# Patient Record
Sex: Female | Born: 1955
Health system: Southern US, Community
[De-identification: ages and names within clinical notes are randomized; demographics above are authoritative.]

## PROBLEM LIST (undated history)

## (undated) DIAGNOSIS — I10 Essential (primary) hypertension: Secondary | ICD-10-CM

## (undated) DIAGNOSIS — N6092 Unspecified benign mammary dysplasia of left breast: Secondary | ICD-10-CM

## (undated) DIAGNOSIS — E785 Hyperlipidemia, unspecified: Secondary | ICD-10-CM

## (undated) DIAGNOSIS — M25569 Pain in unspecified knee: Secondary | ICD-10-CM

## (undated) DIAGNOSIS — Z9289 Personal history of other medical treatment: Secondary | ICD-10-CM

## (undated) DIAGNOSIS — IMO0002 Reserved for concepts with insufficient information to code with codable children: Secondary | ICD-10-CM

## (undated) DIAGNOSIS — Z9889 Other specified postprocedural states: Secondary | ICD-10-CM

## (undated) DIAGNOSIS — M1990 Unspecified osteoarthritis, unspecified site: Secondary | ICD-10-CM

## (undated) DIAGNOSIS — Z8619 Personal history of other infectious and parasitic diseases: Secondary | ICD-10-CM

## (undated) DIAGNOSIS — Z5189 Encounter for other specified aftercare: Secondary | ICD-10-CM

## (undated) DIAGNOSIS — E119 Type 2 diabetes mellitus without complications: Secondary | ICD-10-CM

## (undated) DIAGNOSIS — I2510 Atherosclerotic heart disease of native coronary artery without angina pectoris: Secondary | ICD-10-CM

## (undated) DIAGNOSIS — S46009A Unspecified injury of muscle(s) and tendon(s) of the rotator cuff of unspecified shoulder, initial encounter: Secondary | ICD-10-CM

## (undated) DIAGNOSIS — Z8742 Personal history of other diseases of the female genital tract: Secondary | ICD-10-CM

## (undated) DIAGNOSIS — Z91018 Allergy to other foods: Secondary | ICD-10-CM

## (undated) DIAGNOSIS — J45990 Exercise induced bronchospasm: Secondary | ICD-10-CM

## (undated) DIAGNOSIS — M2550 Pain in unspecified joint: Secondary | ICD-10-CM

## (undated) DIAGNOSIS — M722 Plantar fascial fibromatosis: Secondary | ICD-10-CM

## (undated) DIAGNOSIS — J45909 Unspecified asthma, uncomplicated: Secondary | ICD-10-CM

## (undated) DIAGNOSIS — M549 Dorsalgia, unspecified: Secondary | ICD-10-CM

## (undated) DIAGNOSIS — G43109 Migraine with aura, not intractable, without status migrainosus: Secondary | ICD-10-CM

## (undated) DIAGNOSIS — M65312 Trigger thumb, left thumb: Secondary | ICD-10-CM

## (undated) DIAGNOSIS — D649 Anemia, unspecified: Secondary | ICD-10-CM

## (undated) HISTORY — DX: Encounter for other specified aftercare: Z51.89

## (undated) HISTORY — DX: Unspecified benign mammary dysplasia of left breast: N60.92

## (undated) HISTORY — DX: Personal history of other infectious and parasitic diseases: Z86.19

## (undated) HISTORY — DX: Reserved for concepts with insufficient information to code with codable children: IMO0002

## (undated) HISTORY — DX: Other specified postprocedural states: Z98.890

## (undated) HISTORY — DX: Atherosclerotic heart disease of native coronary artery without angina pectoris: I25.10

## (undated) HISTORY — DX: Pain in unspecified joint: M25.50

## (undated) HISTORY — DX: Hyperlipidemia, unspecified: E78.5

## (undated) HISTORY — DX: Morbid (severe) obesity due to excess calories: E66.01

## (undated) HISTORY — DX: Allergy to other foods: Z91.018

## (undated) HISTORY — DX: Unspecified osteoarthritis, unspecified site: M19.90

## (undated) HISTORY — DX: Personal history of other medical treatment: Z92.89

## (undated) HISTORY — DX: Pain in unspecified knee: M25.569

## (undated) HISTORY — DX: Migraine with aura, not intractable, without status migrainosus: G43.109

## (undated) HISTORY — DX: Essential (primary) hypertension: I10

## (undated) HISTORY — DX: Unspecified injury of muscle(s) and tendon(s) of the rotator cuff of unspecified shoulder, initial encounter: S46.009A

## (undated) HISTORY — DX: Trigger thumb, left thumb: M65.312

## (undated) HISTORY — DX: Type 2 diabetes mellitus without complications: E11.9

## (undated) HISTORY — DX: Exercise induced bronchospasm: J45.990

## (undated) HISTORY — DX: Personal history of other diseases of the female genital tract: Z87.42

## (undated) HISTORY — DX: Anemia, unspecified: D64.9

## (undated) HISTORY — DX: Plantar fascial fibromatosis: M72.2

## (undated) HISTORY — DX: Unspecified asthma, uncomplicated: J45.909

## (undated) HISTORY — DX: Dorsalgia, unspecified: M54.9

---

## 1981-10-18 DIAGNOSIS — Z5189 Encounter for other specified aftercare: Secondary | ICD-10-CM

## 1981-10-18 HISTORY — PX: EXPLORATORY LAPAROTOMY: SUR591

## 1981-10-18 HISTORY — DX: Encounter for other specified aftercare: Z51.89

## 1982-10-18 HISTORY — PX: TOTAL ABDOMINAL HYSTERECTOMY W/ BILATERAL SALPINGOOPHORECTOMY: SHX83

## 1997-10-18 HISTORY — PX: GANGLION CYST EXCISION: SHX1691

## 1997-11-21 ENCOUNTER — Ambulatory Visit (HOSPITAL_COMMUNITY): Admission: RE | Admit: 1997-11-21 | Discharge: 1997-11-21 | Payer: Self-pay | Admitting: Family Medicine

## 1998-06-25 ENCOUNTER — Encounter: Payer: Self-pay | Admitting: Family Medicine

## 1998-06-25 ENCOUNTER — Ambulatory Visit (HOSPITAL_COMMUNITY): Admission: RE | Admit: 1998-06-25 | Discharge: 1998-06-25 | Payer: Self-pay | Admitting: Family Medicine

## 1999-01-15 ENCOUNTER — Ambulatory Visit (HOSPITAL_COMMUNITY): Admission: RE | Admit: 1999-01-15 | Discharge: 1999-01-15 | Payer: Self-pay | Admitting: Family Medicine

## 1999-07-23 ENCOUNTER — Encounter: Payer: Self-pay | Admitting: Family Medicine

## 1999-07-23 ENCOUNTER — Ambulatory Visit (HOSPITAL_COMMUNITY): Admission: RE | Admit: 1999-07-23 | Discharge: 1999-07-23 | Payer: Self-pay | Admitting: Family Medicine

## 2000-08-01 ENCOUNTER — Encounter: Payer: Self-pay | Admitting: Family Medicine

## 2000-08-01 ENCOUNTER — Ambulatory Visit (HOSPITAL_COMMUNITY): Admission: RE | Admit: 2000-08-01 | Discharge: 2000-08-01 | Payer: Self-pay | Admitting: Family Medicine

## 2001-04-03 ENCOUNTER — Other Ambulatory Visit: Admission: RE | Admit: 2001-04-03 | Discharge: 2001-04-03 | Payer: Self-pay | Admitting: Family Medicine

## 2001-08-11 ENCOUNTER — Ambulatory Visit (HOSPITAL_COMMUNITY): Admission: RE | Admit: 2001-08-11 | Discharge: 2001-08-11 | Payer: Self-pay | Admitting: Family Medicine

## 2001-08-11 ENCOUNTER — Encounter: Payer: Self-pay | Admitting: Family Medicine

## 2002-08-23 ENCOUNTER — Ambulatory Visit (HOSPITAL_COMMUNITY): Admission: RE | Admit: 2002-08-23 | Discharge: 2002-08-23 | Payer: Self-pay | Admitting: Family Medicine

## 2002-08-23 ENCOUNTER — Encounter: Payer: Self-pay | Admitting: Family Medicine

## 2003-06-23 ENCOUNTER — Ambulatory Visit (HOSPITAL_COMMUNITY): Admission: RE | Admit: 2003-06-23 | Discharge: 2003-06-23 | Payer: Self-pay | Admitting: Pediatrics

## 2003-06-23 ENCOUNTER — Encounter: Payer: Self-pay | Admitting: Family Medicine

## 2003-10-25 ENCOUNTER — Emergency Department (HOSPITAL_COMMUNITY): Admission: EM | Admit: 2003-10-25 | Discharge: 2003-10-25 | Payer: Self-pay | Admitting: Emergency Medicine

## 2003-12-19 ENCOUNTER — Other Ambulatory Visit: Admission: RE | Admit: 2003-12-19 | Discharge: 2003-12-19 | Payer: Self-pay | Admitting: Obstetrics & Gynecology

## 2003-12-25 ENCOUNTER — Ambulatory Visit (HOSPITAL_COMMUNITY): Admission: RE | Admit: 2003-12-25 | Discharge: 2003-12-25 | Payer: Self-pay | Admitting: Pediatrics

## 2003-12-26 ENCOUNTER — Encounter: Payer: Self-pay | Admitting: Cardiology

## 2003-12-26 ENCOUNTER — Ambulatory Visit (HOSPITAL_COMMUNITY): Admission: RE | Admit: 2003-12-26 | Discharge: 2003-12-26 | Payer: Self-pay | Admitting: Cardiology

## 2005-01-01 ENCOUNTER — Encounter: Admission: RE | Admit: 2005-01-01 | Discharge: 2005-01-01 | Payer: Self-pay | Admitting: Pediatrics

## 2005-01-03 ENCOUNTER — Emergency Department (HOSPITAL_COMMUNITY): Admission: EM | Admit: 2005-01-03 | Discharge: 2005-01-03 | Payer: Self-pay | Admitting: Emergency Medicine

## 2005-01-04 ENCOUNTER — Encounter: Admission: RE | Admit: 2005-01-04 | Discharge: 2005-01-04 | Payer: Self-pay | Admitting: Interventional Radiology

## 2005-01-12 ENCOUNTER — Encounter: Admission: RE | Admit: 2005-01-12 | Discharge: 2005-01-12 | Payer: Self-pay | Admitting: Pediatrics

## 2005-10-18 HISTORY — PX: CHOLECYSTECTOMY: SHX55

## 2006-02-22 ENCOUNTER — Encounter: Admission: RE | Admit: 2006-02-22 | Discharge: 2006-02-22 | Payer: Self-pay | Admitting: Gastroenterology

## 2006-04-14 ENCOUNTER — Ambulatory Visit (HOSPITAL_COMMUNITY): Admission: RE | Admit: 2006-04-14 | Discharge: 2006-04-14 | Payer: Self-pay | Admitting: General Surgery

## 2006-04-14 ENCOUNTER — Encounter (INDEPENDENT_AMBULATORY_CARE_PROVIDER_SITE_OTHER): Payer: Self-pay | Admitting: Specialist

## 2007-10-19 DIAGNOSIS — Z9889 Other specified postprocedural states: Secondary | ICD-10-CM

## 2007-10-19 HISTORY — DX: Other specified postprocedural states: Z98.890

## 2008-10-18 DIAGNOSIS — E119 Type 2 diabetes mellitus without complications: Secondary | ICD-10-CM

## 2008-10-18 HISTORY — PX: BREAST LUMPECTOMY: SHX2

## 2008-10-18 HISTORY — DX: Type 2 diabetes mellitus without complications: E11.9

## 2008-10-18 HISTORY — PX: BREAST BIOPSY: SHX20

## 2009-12-08 ENCOUNTER — Other Ambulatory Visit: Admission: RE | Admit: 2009-12-08 | Discharge: 2009-12-08 | Payer: Self-pay | Admitting: General Surgery

## 2010-07-31 ENCOUNTER — Emergency Department (HOSPITAL_COMMUNITY): Admission: EM | Admit: 2010-07-31 | Discharge: 2010-08-01 | Payer: Self-pay | Admitting: Emergency Medicine

## 2010-11-04 ENCOUNTER — Ambulatory Visit: Payer: Self-pay | Admitting: Family Medicine

## 2010-12-07 ENCOUNTER — Encounter: Payer: Commercial Managed Care - PPO | Attending: Internal Medicine

## 2010-12-29 ENCOUNTER — Encounter: Payer: Commercial Managed Care - PPO | Attending: Internal Medicine

## 2011-02-04 ENCOUNTER — Encounter: Payer: 59 | Attending: Internal Medicine | Admitting: *Deleted

## 2011-02-04 DIAGNOSIS — Z713 Dietary counseling and surveillance: Secondary | ICD-10-CM | POA: Insufficient documentation

## 2011-02-04 DIAGNOSIS — E119 Type 2 diabetes mellitus without complications: Secondary | ICD-10-CM | POA: Insufficient documentation

## 2011-03-05 NOTE — Op Note (Signed)
[name], Cindy Carey            ACCOUNT NO.:  [id number]   MEDICAL RECORD NO.:  [id number]          PATIENT TYPE:  EMS   LOCATION:  MAJO                         FACILITY:  [facility]   PHYSICIAN:  Deanna Artis. Hickling, M.D.DATE OF BIRTH:  Mar 13, 1956   DATE OF PROCEDURE:  01/03/2005  DATE OF DISCHARGE:                                 OPERATIVE REPORT   PROCEDURE PERFORMED:  Lumbar puncture.   SIGNIFICANT PHYSICAL FINDINGS:  Severe headache with meningismus, evaluate  for subarachnoid hemorrhage.   MEDICAL HISTORY:  The patient has complicated migraines with cluster type  headaches, allodynia, severe persistent visual scotomata and intermittent  severe pain of various types.  In the past week she has had two episodes of  pounding pain associated with loud tinnitus followed by confusional state  and stiff neck.  CT scan of the brain failed to show evidence of hemorrhage  or any other lesions.  Lumbar puncture is being done to look for the  presence of subarachnoid hemorrhage.   DESCRIPTION OF PROCEDURE:  After informed consent, the patient was sterilely  prepped and draped, local anesthesia with 1% Xylocaine was instilled in the  subcutaneous tissue over L3-4.  On the second pass, 13 mL of clear,  colorless spinal fluid was obtained.  Opening pressure 240 mmH2O, closing  pressure 190 mmH2O.  The patient came in with a severe headache, nausea and  allodynia and visual scotoma.  During the course of the lumbar puncture, her  headache was somewhat relieved as fluid was removed.  The patient tolerated  the procedure well.  Samples will be sent to the laboratory for culture,  glucose, protein, cell count and differential.  The remainder will be saved  in case it is needed.      WHH/MEDQ  D:  01/03/2005  T:  01/04/2005  Job:  [id number]

## 2011-03-05 NOTE — Op Note (Signed)
Cindy Carey, Cindy Carey            ACCOUNT NO.:  [id number]   MEDICAL RECORD NO.:  [id number]            PATIENT TYPE:   LOCATION:                                 FACILITY:   PHYSICIAN:  Cherylynn Ridges, M.D.         DATE OF BIRTH:   DATE OF PROCEDURE:  DATE OF DISCHARGE:                                 OPERATIVE REPORT   PREOPERATIVE DIAGNOSIS:  Symptomatic cholelithiasis.   POSTOPERATIVE DIAGNOSIS.:  Symptomatic cholelithiasis.   PROCEDURE:  Laparoscopic cholecystectomy with cholangiogram.   SURGEON:  Marta Lamas. Lindie Spruce, M.D.   ASSISTANT:  Rose Phi. Maple Hudson, M.D.   ANESTHESIA:  General endotracheal.   ESTIMATED BLOOD LOSS:  Less than 30 mL.   COMPLICATIONS:  None.   CONDITION:  Stable.   FINDINGS:  An acutely, chronically-inflamed gallbladder with multiple  omental adhesions.   INDICATIONS FOR OPERATION:  The patient is a 55 year old patient with  abdominal pain and gallstones, who now comes in for elective laparoscopic  cholecystectomy.   FINDINGS:  The patient had dense adhesions to the gallbladder body and  infundibulum.  Cholangiogram was normal.   OPERATION:  The patient was taken to the operating room, placed on the table  in supine position.  After an adequate general endotracheal anesthetic was  administered, she was prepped and draped in the usual sterile manner  exposing midline and right upper quadrant.   The patient had a previous right paramedian incision.  A supraumbilical  curvilinear incision was made using a #11 blade and taken down to the  midline fascia.  With appendiceal retractors in the wound, we able to grasp  the midline fascia, then make an incision in the fascia using a #15 blade.  Through this fascial opening, we able to bluntly dissect into the peritoneal  cavity and freely pass a finger into this area.  A pursestring suture of #0  Vicryl on a UR-6 needle was passed around the fascial opening and this was  used to secure the [place] cannula which  was subsequently passed in place.  With the Hasson cannula in place, we insufflated with carbon dioxide gas up  to maximal intra-abdominal pressure of 15 mmHg.  We then passed two, right  costal margin, 5 mm cannulas, and the subxiphoid 11/12 mm cannula under  direct vision into the peritoneal cavity.  Once all cannulas were in place,  the patient was placed in reverse Trendelenburg.  The left side was tilted  down, and the dissection begun.   The dome of the gallbladder was easily grasped using a ratcheted grasper  through the lateral-most 5 mm cannula.  We had to place a second grasper  after we dissected off omental and pericolonic adhesions to the body and  infundibulum of the gallbladder.  Once we had the infundibulum freed up,  using dissection using [place] dissector we able to dissect out the  peritoneum overlying the triangle of Calot and hepatoduodenal triangle  isolating both the cystic duct and the cystic artery.  These structures were  easily seen and dissected free getting windows around them with minimal  difficulty.  We passed an Endoclip across the gallbladder side of the cystic  duct and made a cholecystodochotomy, through which a Cook catheter was  passed, which had been passed through the anterior abdominal wall.  A  cholangiogram was done through the cholangiocatheter which would show good  flow into the duodenum, no intraductal filling defects, no dilation of the  duct and good proximal flow.   Once the cholangiogram was completely removed, the catheter and clip which  had secured it in place.  We passed three Endoclips on the distal cystic  duct and transected the cystic duct.  Two distal and two proximal Endoclips  were placed on the cystic artery and the cystic artery was transected.  We  then dissected out the gallbladder from its bed with minimal difficulty,  puncturing the gallbladder with electrocautery and allowing bile spillage;  however, there was no  spillage of stones.   Once the gallbladder was completely dissected free, we were able to bring it  out through the supraumbilical fascial site using an EndoCatch bag.  Once  this was done, we were able to close out the supraumbilical fascial opening  using a pursestring suture which was in place.  We irrigated the abdominal  cavity with about a liter of warm saline solution.  Once this was done, we  aspirated all fluid and gas from above the liver as we removed all cannulas.   We injected 0.25% Marcaine with epi at all sites and then closed the skin  using running, subcuticular stitch of 4-0 Vicryl.  Sterile dressings were  applied including Steri-Strips.  All needle, sponge counts and instrument  counts were correct.      Cherylynn Ridges, M.D.  Electronically Signed     JOW/MEDQ  D:  04/14/2006  T:  04/14/2006  Job:  [id number]

## 2011-03-17 ENCOUNTER — Encounter: Payer: Commercial Managed Care - PPO | Attending: Internal Medicine | Admitting: Dietician

## 2011-03-23 ENCOUNTER — Ambulatory Visit: Payer: Commercial Managed Care - PPO | Admitting: Dietician

## 2011-10-19 DIAGNOSIS — Z9289 Personal history of other medical treatment: Secondary | ICD-10-CM

## 2011-10-19 HISTORY — DX: Personal history of other medical treatment: Z92.89

## 2012-06-07 ENCOUNTER — Encounter (INDEPENDENT_AMBULATORY_CARE_PROVIDER_SITE_OTHER): Payer: Self-pay | Admitting: Surgery

## 2012-06-07 ENCOUNTER — Other Ambulatory Visit (INDEPENDENT_AMBULATORY_CARE_PROVIDER_SITE_OTHER): Payer: Self-pay | Admitting: General Surgery

## 2012-06-07 ENCOUNTER — Ambulatory Visit (INDEPENDENT_AMBULATORY_CARE_PROVIDER_SITE_OTHER): Payer: Commercial Managed Care - PPO | Admitting: Surgery

## 2012-06-07 VITALS — BP 136/88 | HR 96 | Temp 97.3°F | Resp 20 | Ht 63.0 in | Wt 278.6 lb

## 2012-06-07 DIAGNOSIS — K828 Other specified diseases of gallbladder: Secondary | ICD-10-CM | POA: Insufficient documentation

## 2012-06-07 DIAGNOSIS — Z9889 Other specified postprocedural states: Secondary | ICD-10-CM

## 2012-06-07 DIAGNOSIS — E669 Obesity, unspecified: Secondary | ICD-10-CM

## 2012-06-07 NOTE — Progress Notes (Signed)
Chief Complaint:  Obesity and DM with BMI 49  History of Present Illness:  Cindy Carey is an 56 y.o. female who has been to our seminars and is wondering what bariatric procedure would be best for her. She has diabetes mellitus since 2010 and has been vacillating between lapband and Roux-en-Y gastric bypass. She is followed by Dr. Ivery Quale. 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  She had endometriosis as a young woman and underwent an exploratory lap 1983 followed by a complete abdominal hysterectomy in 1984. I told her that would be one mitigating factor and Roux-en-Y gastric bypass since she could have really severe adhesions binding her small bowel in her pelvis and precluding Roux-en-Y. We may going to go with Roux was her primary procedure with lapband as a backup in case were unable to complete the Roux.  With her problems with endometriosis she had multiple transfusions. She has had problems with high blood pressure as well as her diabetes. She's tried multiple medical weight loss interventions including medications such as Meridia and phentermine. She denies loud snoring.  I think should be a good candidate were for weight loss surgery since she has been overweight for about the last 25 years and has a significant type 2 diabetes. Will begin the journey for rule out primarily but with the lapband as a backup. History reviewed. No pertinent past medical history.  Past Surgical History  Procedure Date  . Abdominal hysterectomy   . Cholecystectomy   . Breast lumpectomy     Current Outpatient Prescriptions  Medication Sig Dispense Refill  . lisinopril (PRINIVIL,ZESTRIL) 10 MG tablet Take 10 mg by mouth daily.      . metFORMIN (GLUCOPHAGE) 500 MG tablet Take 500 mg by mouth 2 (two) times daily with a meal.      . simvastatin (ZOCOR) 40 MG tablet Take 40 mg by mouth every evening.      . topiramate (TOPAMAX) 50 MG tablet Take 50 mg by mouth 2 (two) times  daily.       Bee venom; Penicillins; and Shellfish allergy History reviewed. No pertinent family history. Social History:   reports that she has never smoked. She has never used smokeless tobacco. Her alcohol and drug histories not on file.   REVIEW OF SYSTEMS - PERTINENT POSITIVES ONLY: See above  Physical Exam:   Blood pressure 136/88, pulse 96, temperature 97.3 F (36.3 C), temperature source Temporal, resp. rate 20, height 5\' 3"  (1.6 m), weight 278 lb 9.6 oz (126.372 kg). Body mass index is 49.35 kg/(m^2).  Gen:  WDWN WF NAD  Neurological: Alert and oriented to person, place, and time. Motor and sensory function is grossly intact  Head: Normocephalic and atraumatic.  Eyes: Conjunctivae are normal. Pupils are equal, round, and reactive to light. No scleral icterus.  Neck: Normal range of motion. Neck supple. No tracheal deviation or thyromegaly present.  Cardiovascular:  SR without murmurs or gallops.  No carotid bruits Respiratory: Effort normal.  No respiratory distress. No chest wall tenderness. Breath sounds normal.  No wheezes, rales or rhonchi.  Abdomen:  Multiple incisions GU: Musculoskeletal: Normal range of motion. Extremities are nontender. No cyanosis, edema or clubbing noted Lymphadenopathy: No cervical, preauricular, postauricular or axillary adenopathy is present Skin: Skin is warm and dry. No rash noted. No diaphoresis. No erythema. No pallor. Pscyh: Normal mood and affect. Behavior is normal. Judgment and thought content normal.   LABORATORY RESULTS: No results found for this or  any previous visit (from the past 48 hour(s)).  RADIOLOGY RESULTS: No results found.  Problem List: Patient Active Problem List  Diagnosis  . S/P breast biopsy, left 2011 [place]  . Biliary dyskinesia-lap chole 2007  . Obesity-BMI 49    Assessment & Plan: Morbid obesity and DM. Plan roux Y gastric bypass with lapband as a backup incase unable to do roux Y    Matt B. Daphine Deutscher,  MD, [facility] [phone] beeper [phone]  06/07/2012 4:41 PM

## 2012-06-20 ENCOUNTER — Ambulatory Visit (HOSPITAL_COMMUNITY)
Admission: RE | Admit: 2012-06-20 | Payer: Commercial Managed Care - PPO | Source: Ambulatory Visit | Admitting: General Surgery

## 2012-06-20 ENCOUNTER — Encounter (HOSPITAL_COMMUNITY): Admission: RE | Payer: Self-pay | Source: Ambulatory Visit

## 2012-06-20 SURGERY — BREATH TEST, FOR HELICOBACTER PYLORI

## 2012-06-24 ENCOUNTER — Ambulatory Visit: Payer: Commercial Managed Care - PPO | Admitting: *Deleted

## 2012-07-11 ENCOUNTER — Other Ambulatory Visit (HOSPITAL_COMMUNITY): Payer: Commercial Managed Care - PPO

## 2012-07-11 ENCOUNTER — Ambulatory Visit (HOSPITAL_COMMUNITY): Payer: Commercial Managed Care - PPO

## 2012-08-24 ENCOUNTER — Ambulatory Visit (HOSPITAL_COMMUNITY)
Admission: RE | Admit: 2012-08-24 | Discharge: 2012-08-24 | Disposition: A | Discharge status: Home / Self Care | Payer: 59 | Source: Ambulatory Visit | Attending: Surgery | Admitting: Surgery

## 2012-08-24 ENCOUNTER — Other Ambulatory Visit: Payer: Self-pay

## 2012-08-24 ENCOUNTER — Ambulatory Visit (HOSPITAL_COMMUNITY): Payer: 59

## 2012-08-24 DIAGNOSIS — E119 Type 2 diabetes mellitus without complications: Secondary | ICD-10-CM | POA: Insufficient documentation

## 2012-08-24 DIAGNOSIS — E669 Obesity, unspecified: Secondary | ICD-10-CM

## 2012-08-24 DIAGNOSIS — K828 Other specified diseases of gallbladder: Secondary | ICD-10-CM

## 2012-08-24 DIAGNOSIS — I10 Essential (primary) hypertension: Secondary | ICD-10-CM | POA: Insufficient documentation

## 2012-08-24 DIAGNOSIS — Z6841 Body Mass Index (BMI) 40.0 and over, adult: Secondary | ICD-10-CM | POA: Insufficient documentation

## 2012-08-24 DIAGNOSIS — K449 Diaphragmatic hernia without obstruction or gangrene: Secondary | ICD-10-CM | POA: Insufficient documentation

## 2012-08-24 DIAGNOSIS — Z13820 Encounter for screening for osteoporosis: Secondary | ICD-10-CM | POA: Insufficient documentation

## 2012-08-28 ENCOUNTER — Encounter (HOSPITAL_COMMUNITY)
Admission: RE | Disposition: A | Discharge status: Home / Self Care | Payer: Self-pay | Source: Ambulatory Visit | Attending: Surgery

## 2012-08-28 ENCOUNTER — Ambulatory Visit (HOSPITAL_COMMUNITY)
Admission: RE | Admit: 2012-08-28 | Discharge: 2012-08-28 | Disposition: A | Discharge status: Home / Self Care | Payer: 59 | Source: Ambulatory Visit | Attending: Surgery | Admitting: Surgery

## 2012-08-28 DIAGNOSIS — Z01818 Encounter for other preprocedural examination: Secondary | ICD-10-CM | POA: Insufficient documentation

## 2012-08-28 HISTORY — PX: BREATH TEK H PYLORI: SHX5422

## 2012-08-28 SURGERY — BREATH TEST, FOR HELICOBACTER PYLORI

## 2012-08-29 ENCOUNTER — Encounter (HOSPITAL_COMMUNITY): Payer: Self-pay

## 2012-08-29 ENCOUNTER — Encounter (HOSPITAL_COMMUNITY): Payer: Self-pay | Admitting: Surgery

## 2012-08-31 ENCOUNTER — Ambulatory Visit: Payer: 59 | Admitting: *Deleted

## 2012-09-04 ENCOUNTER — Encounter: Payer: Self-pay | Admitting: *Deleted

## 2012-09-04 ENCOUNTER — Encounter: Payer: 59 | Attending: Surgery | Admitting: *Deleted

## 2012-09-04 VITALS — Ht 63.0 in | Wt 278.3 lb

## 2012-09-04 DIAGNOSIS — E669 Obesity, unspecified: Secondary | ICD-10-CM | POA: Insufficient documentation

## 2012-09-04 DIAGNOSIS — Z01818 Encounter for other preprocedural examination: Secondary | ICD-10-CM | POA: Insufficient documentation

## 2012-09-04 DIAGNOSIS — Z713 Dietary counseling and surveillance: Secondary | ICD-10-CM | POA: Insufficient documentation

## 2012-09-04 HISTORY — DX: Morbid (severe) obesity due to excess calories: E66.01

## 2012-09-04 NOTE — Patient Instructions (Addendum)
   Follow Pre-Op Nutrition Goals to prepare for Gastric Bypass Surgery.   Call the [facility] at [phone] once you have been given your surgery date to enrolled in the Pre-Op Nutrition Class. You will need to attend this nutrition class 3-4 weeks prior to your surgery. 

## 2012-09-04 NOTE — Progress Notes (Signed)
  Pre-Op Assessment Visit:  Pre-Operative RYGB Surgery  Medical Nutrition Therapy:  Appt start time: 1130   End time:  1230.  Patient was seen on 09/04/2012 for Pre-Operative RYGB Nutrition Assessment. Assessment and letter of approval faxed to [facility] Bariatric Surgery Program coordinator on 09/04/2012.  Approval letter sent to [facility] and will be available in the chart under the media tab.  Handouts given during visit include:  Pre-Op Goals   Bariatric Surgery Protein Shakes  Patient to call for Pre-Op and Post-Op Nutrition Education at the [facility] when surgery is scheduled.

## 2012-09-05 ENCOUNTER — Ambulatory Visit: Payer: 59 | Admitting: *Deleted

## 2012-09-06 ENCOUNTER — Other Ambulatory Visit (INDEPENDENT_AMBULATORY_CARE_PROVIDER_SITE_OTHER): Payer: Self-pay | Admitting: Surgery

## 2012-09-06 LAB — T4: T4, Total: 12.1 ug/dL (ref 5.0–12.5)

## 2012-10-10 ENCOUNTER — Encounter (INDEPENDENT_AMBULATORY_CARE_PROVIDER_SITE_OTHER): Payer: Self-pay

## 2012-11-28 ENCOUNTER — Encounter (INDEPENDENT_AMBULATORY_CARE_PROVIDER_SITE_OTHER): Payer: Self-pay

## 2012-12-02 ENCOUNTER — Other Ambulatory Visit: Payer: Self-pay

## 2013-06-01 ENCOUNTER — Ambulatory Visit (INDEPENDENT_AMBULATORY_CARE_PROVIDER_SITE_OTHER): Payer: 59 | Admitting: Family Medicine

## 2013-06-01 VITALS — BP 127/81 | HR 74 | Ht 64.0 in | Wt 276.0 lb

## 2013-06-01 DIAGNOSIS — E119 Type 2 diabetes mellitus without complications: Secondary | ICD-10-CM

## 2013-06-01 DIAGNOSIS — E1169 Type 2 diabetes mellitus with other specified complication: Secondary | ICD-10-CM | POA: Insufficient documentation

## 2013-06-01 NOTE — Progress Notes (Signed)
Subjective:  Patient presents today for 3 month diabetes follow-up as part of the employer-sponsored [employer] to Wellness program. Current diabetes regimen includes metformin 1000 mg BID, Invokana 300 mg once daily. Patient also continues on daily ASA, ACEi, and statin.   Last appointment with Dr. Eloise Harman was in April. She usually sees him every 3-6 months for diabetes. She anticipates seeing him again in October. Last A1C in April was 6.1%.   Patient left her meter in [place] a few weeks ago so she hasn't been checking her blood sugar for the past 10 days or so. She reports no other medical changes.    Disease Assessments:  Diabetes:  uses glucometer; Type of Diabetes: Type 2; Year of diagnosis 2010; Sees Diabetes provider 3 times per year; Diabetes Education attended all core classes; MD managing Diabetes paterson; checks blood glucose 2-6 times a week; Current Diabetes related medical conditions are High blood pressure, High cholesterol; takes medications as prescribed; takes an aspirin a day; hypoglycemia frequency never;   Highest CBG 128; Lowest CBG 87; Other Diabetes History: When she had her meter she was checking it three or four times a week fasting. She did it more when she was hungrier.   She did not have her meter with her today. Per patient report it usually runs about 100.       Social History:  Denies alcohol use; Denies caffeine use; Denies drug use; Exercise adherence 3-4 days a week; 180 minutes of exercise per week.  Occupation: NP at [employer]   Physical Activity-  [employer]. This past week she has been doing yard work (cutting grass and trimming). She reports that she is really sore from all the movement. When she does go to [employer], she goes with her husband at least four times a week and she spends 45 minutes there. She does the treadmill. Sometimes she will do the stationary bike. She is looking forward to the fall- she and her husband go and  hike more.   Nutrition-  She states that she thinks it could be better. She hates summer and states she does better in the fall and winter. She doesn't cook as much because of the heat. Her diet is not as varied.   Typical Day  B- instant oatmeals 1 packet; strawberry [employer] protein shake.   snack- measured out walnuts or almonds (~1/4 cup) and a cup of tea. Cheese sticks.   L- varies- baby carrots. Sometimes a sandwich- [place] sandwich with saurkraut, cucumber. Sometimes a salad. When drug reps bring lunch she picks around at what to eat. Sometimes a healthy choice frozen entree.   snack- cup of tea, sometimes PB and cracker  D- last night- 1/2 baked potato, cheese, steamable brussel sprouts, leftover salmon. Banana muffin (small sized).   Doesn't eat fast food. Doesn't eat fruit because it is too messy at work. She states that she eats more on the weekend.      Testing:  Blood Sugar Tests:  Hemoglobin A1c: 6.1 resulted on 01/22/2013     Assessment/Plan: Patient is a 57 year old female with DM2. Last A1C in April was 6.1% and is meeting goal of less than 6.5%. Self reported CBG results are around 100 for fasting, so I expect that her A1C is still meeting her goal of less than 6.5%.   Patient is getting 180 minutes of physical activity during the week. 24 hour recall shows a mostly balanced diet. She is eating vegetables, but  is not eating a lot of fruits. She appears to be staying below 45 grams of carbohydrates with each meal.   Patient admits that she is getting bored with her exercise routine. She states that she has wanted to try the circuit training available at [employer], but she is worried that she will hurt herself using some of the machines. I encouraged her to make an appointment with a trainer from the gym so that she can learn how to perform the exercises safely.   Follow up with patient in 3 months..    Goals for Next Visit-   1. Continue making healthy  eating choices.  2. Continue physical activity. Look for ways to add variety to your routine. If possible, add some light weight training.  Next appointment with me is Friday November 14th at 4:30 PM.

## 2013-07-05 NOTE — Progress Notes (Signed)
Patient ID: Cindy Carey, female   DOB: Oct 23, 1955, 57 y.o.   MRN: [id number] ATTENDING PHYSICIAN NOTE: I have reviewed the chart and agree with the plan as detailed above. Denny Levy MD Pager [phone]

## 2013-08-23 ENCOUNTER — Other Ambulatory Visit: Payer: Self-pay

## 2013-08-31 ENCOUNTER — Ambulatory Visit (INDEPENDENT_AMBULATORY_CARE_PROVIDER_SITE_OTHER): Payer: Self-pay | Admitting: Family Medicine

## 2013-08-31 VITALS — BP 129/84 | HR 76 | Ht 64.0 in | Wt 265.0 lb

## 2013-08-31 DIAGNOSIS — E119 Type 2 diabetes mellitus without complications: Secondary | ICD-10-CM

## 2013-08-31 NOTE — Progress Notes (Signed)
Subjective:  Pending appointment with PCP December 10th for check up. She has lab work a week before her appointment. I will defer A1C testing to that time.   Patient has lost 12 pounds since her last appointment. Patient states that she has tightened things up a bit with eating. She has switched to the wraps that were 14 g CHO instead of 27 g CHO. One of the big problems was that she was eating a lot in the car because each weekend she travels to [place] to see her parents. She has been bringing food with her and she isn't snacking as much in the car.   She saw Dr. Charlett Blake last week for her knee. She has started on diclofenac and she reports that the knee pain has improved.    Disease Assessments:  Diabetes:  uses glucometer; Type of Diabetes: Type 2; Year of diagnosis 2010; Sees Diabetes provider 3 times per year; Diabetes Education attended all core classes; MD managing Diabetes paterson; checks blood glucose 2-6 times a week; Current Diabetes related medical conditions are High blood pressure, High cholesterol; takes medications as prescribed; takes an aspirin a day; hypoglycemia frequency never;   Highest CBG 134; Lowest CBG 81; checks feet daily; Other Diabetes History: She reports that she is checking her blood sugar about three times a week. Usually it is fasting. She does not have her meter with her today. She reports that in the morning it is usually running in the 80-90s. Occasionally it is above 100. In August it was 130 and this was the highest that it has been. Denies hypoglycemia.       Physical Activity-  She is still doing [employer]. She has an appointment this coming week with a personal trainer at her gym. With her knee pain over the past few months she hasn't been able to do as intense of physical activity. She is going to [employer] four days a week for 45 minutes. She is also cleaning her mother in law's house once a week.   On Sundays she goes to the park with her husband  and her dog.   Nutrition-  She reports that she is cooking more in the crockpot lately. She is cooking the meat in the crockpot and then uses it in meals. She reports that overall she is cooking more frequently at home.   She also reports that she is snacking less on the weekends.      Preventive Care:      Flu vaccine: 06/18/2013  Foot Exam: 01/12/2013  Other Preventive Care Notes: pending eye exam in December; pending dental appointment in December also       Vital Signs:  08/31/2013 4:49 PM (EST) Blood Pressure 129 / 84 mm/HgBMI 45.5; Height 5 ft 4 in; Pulse Rate 76 bpm; Weight 265 lbs       Assessment/Plan:  Patient is a 57 year old female with DM2. Last A1C was earlier this year in April and was 6.1%. Patient has an appointment pending in a couple of weeks with her PCP for an annual exam- I will defer A1C testing to that time. Self reported CBGs are around 90 mg/dL fasting in the morning. I expect that her A1C will still be meeting goal when she gets it checked in 2 weeks.   Patient has lost 12 pounds since her last appointment with me. She states that she has been doing better with cutting back on snacking and she has continued with physical activity.  She has even increased physical activity and is now spending 45 minutes four times a week at the gym.   Patient seems motivated to continue making healthy eating choices and to continue physical activity. I will see her back in 4 months..    Goals for Next Visit-  1. Getting through the holidays. As you are cooking during the holidays- remember that as you taste things, it counts towards you are eating.  2. After your birthday or after any other time with a large dessert, bring the extra into work.  3. Once you get your A1C checked, send me the results via email or you can fax it to me- [phone].  4. Keep your appointment with the trainer at your gym. Ask for suggestions to keep yourself safe and injury free on the machines  and also for some additional ideas for circuit training or other ways to keep your routine interesting.   Next appointment to see me is Friday March 20th at 4:30 PM.   Aundra Millet D. Vivia Ewing, PharmD, BCPS

## 2013-10-18 DIAGNOSIS — S46009A Unspecified injury of muscle(s) and tendon(s) of the rotator cuff of unspecified shoulder, initial encounter: Secondary | ICD-10-CM

## 2013-10-18 HISTORY — DX: Unspecified injury of muscle(s) and tendon(s) of the rotator cuff of unspecified shoulder, initial encounter: S46.009A

## 2013-12-19 ENCOUNTER — Other Ambulatory Visit (HOSPITAL_COMMUNITY): Payer: Self-pay | Admitting: Orthopedic Surgery

## 2013-12-19 DIAGNOSIS — M25511 Pain in right shoulder: Secondary | ICD-10-CM

## 2014-01-02 ENCOUNTER — Ambulatory Visit (HOSPITAL_COMMUNITY)
Admission: RE | Admit: 2014-01-02 | Discharge: 2014-01-02 | Disposition: A | Discharge status: Home / Self Care | Payer: 59 | Source: Ambulatory Visit | Attending: Orthopedic Surgery | Admitting: Orthopedic Surgery

## 2014-01-02 ENCOUNTER — Ambulatory Visit (HOSPITAL_COMMUNITY): Payer: 59

## 2014-01-02 DIAGNOSIS — X58XXXA Exposure to other specified factors, initial encounter: Secondary | ICD-10-CM | POA: Insufficient documentation

## 2014-01-02 DIAGNOSIS — M25519 Pain in unspecified shoulder: Secondary | ICD-10-CM | POA: Insufficient documentation

## 2014-01-02 DIAGNOSIS — M67919 Unspecified disorder of synovium and tendon, unspecified shoulder: Secondary | ICD-10-CM | POA: Insufficient documentation

## 2014-01-02 DIAGNOSIS — S46819A Strain of other muscles, fascia and tendons at shoulder and upper arm level, unspecified arm, initial encounter: Secondary | ICD-10-CM | POA: Insufficient documentation

## 2014-01-02 DIAGNOSIS — M719 Bursopathy, unspecified: Secondary | ICD-10-CM | POA: Insufficient documentation

## 2014-01-02 DIAGNOSIS — M25511 Pain in right shoulder: Secondary | ICD-10-CM

## 2014-01-04 ENCOUNTER — Ambulatory Visit (INDEPENDENT_AMBULATORY_CARE_PROVIDER_SITE_OTHER): Payer: Self-pay | Admitting: Family Medicine

## 2014-01-04 VITALS — BP 128/81 | HR 76 | Ht 64.0 in | Wt 260.6 lb

## 2014-01-04 DIAGNOSIS — E119 Type 2 diabetes mellitus without complications: Secondary | ICD-10-CM

## 2014-01-04 NOTE — Progress Notes (Signed)
Subjective:  Patient presents today for 3 month diabetes follow-up as part of the employer-sponsored Link to Wellness program. Current diabetes regimen includes metformin 500 mg ER 2 tablets twice daily & Invokana 300 mg daily. Patient also continues on daily ACEi, and statin.   She just saw Dr. Philip Aspen earlier this month and A1C was 5.6%. She said that her other lab work all came back as normal.   In November she started having knee pain and she started seeing Dr. Lynann Bologna (orthopedist). She started diclofenac and she had a lot of GI symptoms with it and stopped taking it. She altered her exercise habits but then developed a Right rotator cuff tear and will have surgery in April 10th. She has started on Meloxicam for pain and it has helped and is not causing any GI upset. She is scared to take the tramadol. She doesn't like to take medication, especially pain medication.    Disease Assessments:  Diabetes:  uses glucometer; Type of Diabetes: Type 2; Year of diagnosis 2010; Sees Diabetes provider 3 times per year; Diabetes Education attended all core classes; MD managing Diabetes paterson; checks blood glucose 2-6 times a week; Current Diabetes related medical conditions are High blood pressure, High cholesterol; takes medications as prescribed; takes an aspirin a day; hypoglycemia frequency never; checks feet daily;   Highest CBG 126; Other Diabetes History:  Her blood sugar meter broke a few weeks ago and she hasn't been able to check her blood sugar. We ordered a new One Touch Mini for her today.   Denies hypoglycemia.  When her meter was functional she was checking three times a week. She reports that on average fasting CBG was around 90s. Random CBG would be 110-115. Highest she remembers seeing in the past few months was 126 around Christmas.       Physical Activity-  She has been injured and hasn't been able to much physical activity. She has an appointment pending with a physical therapist  next week to discuss exercise that she can do. She has been doing the treadmill three times a week for 10 minutes, then break, then 10 minutes, break and then 10 minutes. She can also do the exercise bike, but only for 10 minute interval.   Nutrition-  She states that she now has a subscription for [employer] She has liked the light casserole recipes and froze the leftovers and then eats that for dinner. It is portion controlled and easy for her to eat.     Preventive Care:    Hemoglobin A1c: 12/26/2013 Via Dr. Philip Aspen 5.6    Vital Signs:  01/04/2014 5:29 PM (EST) Blood Pressure 128 / 81 mm/HgBMI 46.2; Height 5 ft 3 in; Pulse Rate 76 bpm; Weight 260.6 lbs    Testing:  Blood Sugar Tests:  Hemoglobin A1c: 5.6 via dr. Shon Baton office resulted on 12/26/2013      Assessment/Plan: Patient is a 58 year old female with DM2. Most recent A1C was 5.6% and is meeting goal of less than 7%. This is an improvement from her last A1C of 6.1% in April 2014. Patient has also lost an additional 5 pounds since her last appointment with me. She states that over Christmas she had gotten her weight down to 256 pounds, but because she hasn't been able to exercise as much due to injuries, her weight has increased to 260 pounds today.   Patient had questions about Qsymia. Her insurance does not cover qsymia and her responsbility would be  over $100. However, she is already taking one of the components of Qsymia (topiramate) and adding phentermine would be relatively inexpensive- around $4 a month. She states she would like to try it and will contact Dr. Philip Aspen for the prescription.   Patient has rotator cuff surgery scheduled for next month. She is going to see a physical therapist next week to start working on exercises and make plans for what she will be able to do once surgery is over. Patient does not want to regain weight after surgery as she has worked hard and diligently to lose weight so far. I  encouraged her to work with the PT to develop a plan for what to do after surgery.   I will follow up with patient in 5 months when I return from maternity leave..    Goals for Next Visit-  1. Call Dr. Philip Aspen about the phentermine to see if you can get a prescription for it.  2. Start checking your blood sugar again once you get your new blood sugar meter.  3. Before your surgery make a plan for the food you will eat after surgery (freezer meals, easy meals, etc.) that your husband can help you with.  4. Make a plan with your physical therapist for the exercise and physical activity that you can do after your surgery.  Next Appointment to see me is Friday August 14th at 4:30 PM.

## 2014-01-07 ENCOUNTER — Other Ambulatory Visit: Payer: Self-pay | Admitting: Orthopedic Surgery

## 2014-01-25 ENCOUNTER — Encounter (HOSPITAL_BASED_OUTPATIENT_CLINIC_OR_DEPARTMENT_OTHER): Admission: RE | Payer: Self-pay | Source: Ambulatory Visit

## 2014-01-25 ENCOUNTER — Ambulatory Visit (HOSPITAL_BASED_OUTPATIENT_CLINIC_OR_DEPARTMENT_OTHER): Admission: RE | Admit: 2014-01-25 | Payer: 59 | Source: Ambulatory Visit | Admitting: Orthopedic Surgery

## 2014-01-25 SURGERY — SHOULDER ARTHROSCOPY WITH SUBACROMIAL DECOMPRESSION, ROTATOR CUFF REPAIR AND BICEP TENDON REPAIR
Anesthesia: General | Site: Shoulder | Laterality: Right

## 2014-01-28 ENCOUNTER — Encounter: Payer: Self-pay | Admitting: Gynecology

## 2014-01-30 ENCOUNTER — Ambulatory Visit (INDEPENDENT_AMBULATORY_CARE_PROVIDER_SITE_OTHER): Payer: 59 | Admitting: Gynecology

## 2014-01-30 ENCOUNTER — Encounter: Payer: Self-pay | Admitting: Gynecology

## 2014-01-30 VITALS — BP 118/80 | HR 78 | Resp 16 | Ht 63.5 in | Wt 253.0 lb

## 2014-01-30 DIAGNOSIS — E785 Hyperlipidemia, unspecified: Secondary | ICD-10-CM | POA: Insufficient documentation

## 2014-01-30 DIAGNOSIS — I10 Essential (primary) hypertension: Secondary | ICD-10-CM | POA: Insufficient documentation

## 2014-01-30 DIAGNOSIS — Z8742 Personal history of other diseases of the female genital tract: Secondary | ICD-10-CM

## 2014-01-30 DIAGNOSIS — Z90710 Acquired absence of both cervix and uterus: Secondary | ICD-10-CM

## 2014-01-30 DIAGNOSIS — E119 Type 2 diabetes mellitus without complications: Secondary | ICD-10-CM | POA: Insufficient documentation

## 2014-01-30 DIAGNOSIS — E1159 Type 2 diabetes mellitus with other circulatory complications: Secondary | ICD-10-CM | POA: Insufficient documentation

## 2014-01-30 DIAGNOSIS — Z01419 Encounter for gynecological examination (general) (routine) without abnormal findings: Secondary | ICD-10-CM

## 2014-01-30 DIAGNOSIS — Z90721 Acquired absence of ovaries, unilateral: Secondary | ICD-10-CM

## 2014-01-30 DIAGNOSIS — G43109 Migraine with aura, not intractable, without status migrainosus: Secondary | ICD-10-CM | POA: Insufficient documentation

## 2014-01-30 NOTE — Progress Notes (Signed)
58 y.o. Married Caucasian female   [id number] here for annual exam. Pt reports menses are absent due to Hysterectomy regular. She does not report post-menopasual bleeding.  Husband with ED-not sexually active for 2-3y.  Pt had dyspareunia, used lubricants..  Pt had TAH-BSO at 58yo for endometriosis.  Pt was on estrogen until 58yo.    Patient's last menstrual period was 10/18/1982.          Sexually active: no  The current method of family planning is status post hysterectomy.    Exercising: yes  walking, stationary bike, resistance bads 4x/wk Last pap: 2011?  Abnormal PAP: No  Mammogram: 12/28/2013, heterogenous callcifications in left at 13, f/u 51m-9/15 BSE: yes  Colonoscopy: 2009 Normal f/u 10 years DEXA: 2013  Alcohol: no Tobacco: no  Labs: Bevelyn Buckles, MD (Labs done in March)  Health Maintenance  Topic Date Due  . Pap Smear  10/11/1975  . Tetanus/tdap  10/10/1976  . Colonoscopy  10/11/2007  . Influenza Vaccine  05/18/2014  . Mammogram  01/01/2016    Family History  Problem Relation Age of Onset  . Breast cancer Paternal Aunt     17's  . Diabetes Father   . Diabetes Paternal Grandfather   . Heart attack Father   . Osteoporosis Maternal Grandmother   . Thyroid disease Mother     Patient Active Problem List   Diagnosis Date Noted  . Diabetes 06/01/2013  . S/P breast biopsy, left 2011 [place] 06/07/2012  . Biliary dyskinesia-lap chole 2007 06/07/2012  . Obesity-BMI 49 06/07/2012    Past Medical History  Diagnosis Date  . Diabetes mellitus without complication     [id number]  . Hyperlipidemia   . Hypertension   . Morbid obesity 09/04/12    BMI 49.4 kg/m^2   . History of endometriosis   . Blood transfusion without reported diagnosis 1983  . Dyspareunia     Past Surgical History  Procedure Laterality Date  . Breath tek h pylori  08/28/2012    Procedure: BREATH TEK H PYLORI;  Surgeon: Pedro Earls, MD;  Location: [facility];  Service: General;  Laterality:  N/A;  . Total abdominal hysterectomy  1984  . Cholecystectomy  2007  . Breast lumpectomy  2010  . Breast biopsy  2010  . Ganglion cyst excision Left 1999    Foot    Allergies: Bee venom; Shellfish allergy; and Penicillins  Current Outpatient Prescriptions  Medication Sig Dispense Refill  . albuterol (PROVENTIL HFA;VENTOLIN HFA) 108 (90 BASE) MCG/ACT inhaler Inhale 2 puffs into the lungs every 6 (six) hours as needed for wheezing.      . Canagliflozin (INVOKANA) 300 MG TABS Take 1 tablet by mouth every morning.      [name] EPINEPHrine (EPI-PEN) 0.3 mg/0.3 mL SOAJ injection Inject 0.3 mg into the muscle once as needed.      [name] lisinopril (PRINIVIL,ZESTRIL) 10 MG tablet Take 10 mg by mouth daily.      . meloxicam (MOBIC) 15 MG tablet Take 15 mg by mouth daily.      . metFORMIN (GLUCOPHAGE-XR) 500 MG 24 hr tablet Take 1,000 mg by mouth 2 (two) times daily before a meal.      . Multiple Vitamins-Minerals (MULTIVITAMIN PO) Take 1 tablet by mouth daily.      . pantoprazole (PROTONIX) 40 MG tablet Take 40 mg by mouth daily.      . promethazine (PHENERGAN) 25 MG tablet Take 25 mg by mouth every 8 (eight) hours as needed for  nausea.      . simvastatin (ZOCOR) 40 MG tablet Take 40 mg by mouth every evening.      . topiramate (TOPAMAX) 100 MG tablet Take 100 mg by mouth at bedtime.      . valACYclovir (VALTREX) 1000 MG tablet Take 2,000 mg by mouth 2 (two) times daily as needed.       No current facility-administered medications for this visit.    ROS: Pertinent items are noted in HPI.  Exam:    BP 118/80  Pulse 78  Resp 16  Ht 5' 3.5" (1.613 m)  Wt 253 lb (114.76 kg)  BMI 44.11 kg/m2  LMP 10/18/1982 Weight change: @WEIGHTCHANGE @ Last 3 height recordings:  Ht Readings from Last 3 Encounters:  01/30/14 5' 3.5" (1.613 m)  01/04/14 5\' 4"  (1.626 m)  08/31/13 5\' 4"  (1.626 m)   General appearance: alert, cooperative and appears stated age Head: Normocephalic, without obvious abnormality,  atraumatic Neck: no adenopathy, no carotid bruit, no JVD, supple, symmetrical, trachea midline and thyroid not enlarged, symmetric, no tenderness/mass/nodules Lungs: clear to auscultation bilaterally Breasts: normal appearance, no masses or tenderness Heart: regular rate and rhythm, S1, S2 normal, no murmur, click, rub or gallop Abdomen: soft, non-tender; bowel sounds normal; no masses,  no organomegaly Extremities: extremities normal, atraumatic, no cyanosis or edema Skin: Skin color, texture, turgor normal. No rashes or lesions Lymph nodes: Cervical, supraclavicular, and axillary nodes normal. no inguinal nodes palpated Neurologic: Grossly normal   Pelvic: External genitalia:  Excessive adiposis of mons and labia, labia with hypoestrogenic changes              Urethra: normal appearing urethra with no masses, tenderness or lesions              Bartholins and Skenes: normal                 Vagina: atrophic, exam limited by narrow introitus and prolapsing labia, no lesions              Cervix: absent              Pap taken: no        Bimanual Exam:  Uterus:  absent                                      Adnexa:    surgically absent bilateral                                      Rectovaginal: Confirms                                      Anus:  normal sphincter tone, no lesions  A: well woman Postmenopausal vaginal atrophy Morbid obesity     P: mammogram due 9/15 pap smear not indicated Exam limited due to obesity-pt reports not having pelvic for 3y due to extreme discomfort and post exam bleeding, pt aware of new PAP guidelines and tolerated limited exam with smaller speculum Recommend A&D to labia for dryness DEXA reviewed 2013-normal- repeat 2018 counseled on breast self exam, mammography screening, adequate intake of calcium and vitamin D, diet and exercise return annually or prn Discussed PAP guideline changes, importance of weight bearing  exercises, calcium, vit D and balanced  diet.  An After Visit Summary was printed and given to the patient.

## 2014-01-31 ENCOUNTER — Encounter: Payer: Self-pay | Admitting: Gynecology

## 2014-03-12 ENCOUNTER — Encounter: Payer: Self-pay | Admitting: Gynecology

## 2014-04-11 ENCOUNTER — Encounter: Payer: Self-pay | Admitting: Gynecology

## 2014-04-15 ENCOUNTER — Encounter: Payer: Self-pay | Admitting: Gynecology

## 2014-06-10 ENCOUNTER — Ambulatory Visit (INDEPENDENT_AMBULATORY_CARE_PROVIDER_SITE_OTHER): Payer: Self-pay | Admitting: Family Medicine

## 2014-06-10 VITALS — BP 133/83 | HR 91 | Wt 239.0 lb

## 2014-06-10 DIAGNOSIS — E119 Type 2 diabetes mellitus without complications: Secondary | ICD-10-CM

## 2014-06-10 NOTE — Progress Notes (Signed)
Subjective:  Patient presents today for 3 month diabetes follow-up as part of the employer-sponsored Link to Wellness program. Current diabetes regimen includes metformin 1000 mg ER twice dialy and Invokana 300 mg once daily. Patient also continues on daily ACEi, and statin. No med changes or major health changes at this time. Patient states that work has been very busy. She isn't leaving the office until 9 PM at night. It is very busy and she is very stressful. She tries to take lunch and dinner with her to work. She states that it is too hot in the summer to cook very much and it has been hard. She is eating out for dinner some meals. She is sleeping for about 4-5 hours of sleep a night. She has a lot of things going on in her personal life. She has a favorite uncle that was diagnosed with terminal cancer. He is not doing well right now. She has another relative that has kidney failure that she is close to. She has been under considerable stress along with her husband regarding her mother in law. She has mental illness and disappeared this summer for several days. She was found but is not travelling the country in an RV with a new man. Her next appointment to see Cindy Carey is September 16th. Her last A1C check was in May. It was 5.6%. She would like to defer A1C testing today until her appointment with Cindy Carey in September.   Disease Assessments:  Diabetes: uses glucometer; Type of Diabetes: Type 2; Year of diagnosis 2010; Sees Diabetes provider 3 times per year; Diabetes Education attended all core classes; MD managing Diabetes paterson; checks blood glucose 2-6 times a week; Current Diabetes related medical conditions are High blood pressure, High cholesterol; takes medications as prescribed; takes an aspirin a day; hypoglycemia frequency never; checks feet daily;   Highest CBG 108; Lowest CBG 60; Other Diabetes History:  She states that she is checking blood sugar twice a week. She had 1 episode  of hypoglycemia of 60 mg/dL. She reports that she felt pretty awful. She ate banana with PB on it. She did not bring her meter with her to this appointment. High/low are patient reported. On average she states that it is around 90. She has been having intermittent watery diarrhea associated with metformin use. She states that it is dose dependent and it also depends on the food that she is eating. Lately she is not sure how often it is happening.     Social History:  Denies alcohol use; Denies caffeine use; No drug use; Exercise adherence 3-4 days a week; 135 minutes of exercise per week; Medication adherence adherent; Patient can afford medications; Patient knows the purpose/use of medications.  Occupation: NP at [employer]  Physical Activity- Given her recent stress lately she hasn't been exercising as much as she was before. She is going to [employer] three times a week for about 45 minutes. 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  Nutrition- She is still freezing portion cups for casseroles and this is what she is eating for lunch and dinner. She is cooking large amounts of food on the weekend and then freezing it. She has lost 21 pounds since her last visit with me!!    Vital Signs:  06/07/2014 5:06 PM (EST)Blood Pressure 133 / 83 mm/HgBMI 42.3; Height 5 ft 3 in; Pulse Rate 91 bpm; Weight  239 lbs         Assessment/Plan: Patient is a 58 year old female with DM2. Most recent A1C was 5.6% and is meeting goal of less than 7%. She has made considerable progress lately with weight loss and has lost 21 pounds since her last visit with me in March!! She has lost 45 pounds in the last year. She continues to exercise and is making healthy eating choices and portion sizes. She has done this despite significant stress in both her personal and work life over the past several months. I congratulated patient and encouraged  her to continue these lifestyle changes. I also discussed with her making sure that she is taking time for herself and taking time so that she can relax at least a little bit each day.      Goals for Next Visit- 1. [place] membership to the [employer]. Continue exercising three times a week. 2. Continue making healthy eating choices. 3. Try to make time for yourself at some point each day, even if it's for 10 minutes. Next appointment to see me is Friday January 22nd at 4:30 PM.

## 2014-06-20 ENCOUNTER — Encounter: Payer: Self-pay | Admitting: Family Medicine

## 2014-06-20 NOTE — Progress Notes (Signed)
Patient ID: Cindy Carey, female   DOB: 07/22/1956, 58 y.o.   MRN: [id number] Reviewed: Agree with the documentation and management of our [place].

## 2014-06-20 NOTE — Progress Notes (Signed)
Patient ID: Cindy Carey, female   DOB: May 22, 1956, 58 y.o.   MRN: [id number] Reviewed: Agree with the documentation and management of our [place].

## 2014-07-09 ENCOUNTER — Encounter: Payer: Self-pay | Admitting: Family Medicine

## 2014-07-09 NOTE — Progress Notes (Signed)
Patient ID: Cindy Carey, female   DOB: [birth year], 58 y.o.   MRN: [id number] Reviewed: Agree with the documentation and management of our [facility] pharmacologist.

## 2014-08-20 ENCOUNTER — Telehealth: Payer: Self-pay | Admitting: *Deleted

## 2014-08-20 NOTE — Telephone Encounter (Addendum)
Patient in recall for 6 month f/u mammogram according to  12/31/13 mammogram.  S/w patient she had follow up mammogram done at [facility] 07/04/14.  Called [facility] and requested report

## 2014-08-21 NOTE — Telephone Encounter (Signed)
Received report today.  Recommendations- A follow up mammogram in 6 months Result: Bi-Rads 3: Probably Benign  Please advise  Report in your incoming basket.

## 2014-08-22 NOTE — Telephone Encounter (Signed)
Out of current recall.  Recall needed for 3/16.  MMG signed and on your desk.  OK to close encounter.

## 2014-08-26 NOTE — Telephone Encounter (Addendum)
04 mmg recall entered for 12/31/14 document sent for scanning, encounter closed.

## 2014-09-17 ENCOUNTER — Telehealth: Payer: Self-pay

## 2014-09-17 NOTE — Telephone Encounter (Signed)
S/W pt snd she stated she only wanted to see a MD. Pt stated she would think out it and call us back if she decides to stay with the practice.

## 2014-12-02 ENCOUNTER — Ambulatory Visit (INDEPENDENT_AMBULATORY_CARE_PROVIDER_SITE_OTHER): Payer: Self-pay | Admitting: Family Medicine

## 2014-12-02 VITALS — BP 106/76 | Wt 247.2 lb

## 2014-12-02 DIAGNOSIS — E119 Type 2 diabetes mellitus without complications: Secondary | ICD-10-CM

## 2014-12-02 NOTE — Assessment & Plan Note (Addendum)
Subjective:  Patient presents today for 6 month diabetes follow-up as part of the employer-sponsored Link to Wellness program. Current diabetes regimen includes Patient also continues on daily ASA, ACEi, and statin. Most recent MD follow-up was in October with Dr. Philip Aspen. Patient has a pending appt for April 8th. Last A1C in October was 5.2%. I offered A1C testing today and patient declined. She has labwork pending for March. No major health changes at this time. Medication changes- topiramate was discontinued (was taking for headache prevention) and she is taking famotidine.  Patient reports that she has had a rough winter. Her best friend was hospitalized in the ICU for 2 months and she was spent most of her free time with her and helping care for her family. She states that she has not been eating as well lately as she was eating out and eating from vending machines. Her husband is also been out of town so her normal eating routines have been dramatically different.   Disease Assessments:  Diabetes: uses glucometer; Type of Diabetes: Type 2; Year of diagnosis 2010; Sees Diabetes provider 3 times per year; Diabetes Education attended all core classes; MD managing Diabetes paterson; checks blood glucose 2-6 times a week; Current Diabetes related medical conditions are High blood pressure, High cholesterol; takes medications as prescribed; takes an aspirin a day; checks feet daily;   Highest CBG 89; Lowest CBG 86; hypoglycemia frequency never;   Other Diabetes History:  Patient states that she is checking twice daily since she gained weight. Before she was checking three times a week. She denies hypoglycemia- maybe once a month if that.  Patient brought meter to appointment but it only has three checks in the past 3 months. She has another meter that has the majority of CBG checks on it. Patient is currently using One Touch test strips which will no longer be zero copay this year. I will send a fax to  her PCP to change to True Result test system.              Tobacco Assessment: Smoking Status: Never smoker; Last Reviewed: 11/29/2014   Social History:  Denies alcohol use; Denies caffeine use; No drug use; Exercise adherence 3-4 days a week; Medication adherence adherent; Patient can afford medications; Patient knows the purpose/use of medications; 150 minutes of exercise per week.  Occupation: NP at [employer]   Physical Activity- Over the winter she stopped exercising while her best friend was in the ICU. In the past few weeks she has started going back to [employer] and she signed up for a personal trainer. She goes 2 nights a week for 30 minutes. In addition she is going twice a week for 45 minutes. She is concerned with joint mobility and she saw a physical therapist. She was given a routine of exercising with resistance bands which she completes daily.  Nutrition- When her friend was hospitalized she admits that she was eating out more often. Her husband is also travelling for work and it is hard to [employer] for one person. She used to freeze casseroles and pull out individual portion sizes to eat. She wants to get back into the habit of cooking large amounts of food and freezing it. She plans to do this tomorrow.    Preventive Care:    Hemoglobin A1c: 08/16/2014 Via Dr. Philip Aspen 5.2    Colonoscopy: 06/12/2008  Dilated Eye Exam: 10/01/2014  Flu vaccine: 06/18/2014  Foot Exam: 01/12/2013  Last mammography: 01/11/2014  Other Preventive Care Notes:  Dental Exam- January 2016      Overall Health Assessments:  Vision:  Dilated Eye Exam: 10/01/2014   Vital Signs:  12/02/2014 9:27 AM (EST)Blood Pressure 106 / 76 mm/HgBMI 43.8; Height 5 ft 3 in; Weight 247.2 lbs   Testing:  Blood Sugar Tests: Hemoglobin A1c: 5.2 via Dr. Philip Aspen resulted on 08/16/2014        Care Planning:  Learning Preference Assessment:  Learner: Patient  Readiness to Learn  Barriers: None  Teaching Method: Explanation  Evaluation of Learning: Can function independently and verbalize knowledge  Readiness to Change:  How important is your health to you? 9  How confident are you in working to improve your health? 9  How ready are you to change to improve your health? 10  Total Score: 9  Care Plan:  12/02/2014 9:27 AM (EST) (2)  Problem: Physical Inactivity  Role: Clinical Pharmacist  Long Term Goal Start using your personal trainer again. Aim for 3 days a week of physical activity.  Date Started: 12/02/2014  12/02/2014 9:27 AM (EST) (1)  Problem: Weight Gain  Role: Clinical Pharmacist  Long Term Goal Only eat foods that you prepare. Get back into your previous habit of cooking at home and making food  Date Started: 12/02/2014     Assessment/Plan: Patient is a 59 year old female with DM2. Most recent A1C was 5.2% in October and is meeting goal of less than 6.5%. Although patient has gained weight recently self reported CBG checks are always at goal and are usually around 100. I do not expect that A1C will have risen above 6.5%. She has an appointment pending to see PCP next month.  Patient has had several disruptions in her personal life over the past few months and this has led to a deterioration in healthy habits such as cooking her own food and physical activity. She recently started exercising again and I encouraged patient to continue. She also wants to start cooking again and preparing her own food.  Follow up with patient in 6 months.  Goals for Next Visit- 1. Weight loss- only eat foods that you prepare. Get back into your previous habit of cooking at home and making food. 2. Physical activity- continue with your personal trainer. Get back into going to exercise three times a week. Next appointment to see me is Friday August 12th at 4:30 PM.

## 2014-12-19 NOTE — Progress Notes (Signed)
Patient ID: Cindy Carey, female   DOB: 05-09-1956, 59 y.o.   MRN: [id number] Reviewed: Agree with the documentation and management of our [place].

## 2015-02-03 ENCOUNTER — Ambulatory Visit: Payer: 59 | Admitting: Gynecology

## 2015-02-05 ENCOUNTER — Ambulatory Visit: Payer: 59 | Admitting: Gynecology

## 2015-03-09 ENCOUNTER — Telehealth: Payer: 59 | Admitting: Physician Assistant

## 2015-03-09 DIAGNOSIS — J019 Acute sinusitis, unspecified: Principal | ICD-10-CM

## 2015-03-09 DIAGNOSIS — B9689 Other specified bacterial agents as the cause of diseases classified elsewhere: Secondary | ICD-10-CM

## 2015-03-09 MED ORDER — DOXYCYCLINE HYCLATE 100 MG PO CAPS: 100.0000 mg | ORAL_CAPSULE | Freq: Two times a day (BID) | ORAL | Status: DC

## 2015-03-09 NOTE — Progress Notes (Signed)
We are sorry that you are not feeling well.  Here is how we plan to help!  Based on what you have shared with me it looks like you have sinusitis.  Sinusitis is inflammation and infection in the sinus cavities of the head.  Based on your presentation I believe you most likely have Acute Bacterial sinusitis.  This is an infection caused by bacteria and is treated with antibiotics.  I have prescribed Doxycycline 100mg  by mouth twice a day for 10 days.. You may use an oral decongestant such as Mucinex D or if you have glaucoma or high blood pressure use plain Mucinex.  Saline nasal sprays help and can safely be used as often as needed for congestion. If you develop worsening sinus pain, fever or notice severe headache and vision changes, or if symptoms are not better after completion of antibiotic, please schedule an appointment with a health care provider.  Sinus infections are not as easily transmitted as other respiratory infection, however we still recommend that you avoid close contact with loved ones, especially the very young and elderly.  Remember to wash your hands thoroughly throughout the day as this is the number one way to prevent the spread of infection!  Home Care:  Only take medications as instructed by your medical team.  Complete the entire course of an antibiotic.  Do not take these medications with alcohol.  A steam or ultrasonic humidifier can help congestion.  You can place a towel over your head and breathe in the steam from hot water coming from a faucet.  Avoid close contacts especially the very young and the elderly.  Cover your mouth when you cough or sneeze.  Always remember to wash your hands.  Get Help Right Away If:  You develop worsening fever or sinus pain.  You develop a severe head ache or visual changes.  Your symptoms persist after you have completed your treatment plan.  Make sure you  Understand these instructions.  Will watch your  condition.  Will get help right away if you are not doing well or get worse.  Your e-visit answers were reviewed by a board certified advanced clinical practitioner to complete your personal care plan.  Depending on the condition, your plan could have included both over the counter or prescription medications.  If there is a problem please reply  once you have received a response from your provider.  Your safety is important to [name].  If you have drug allergies check your prescription carefully.    You can use MyChart to ask questions about today's visit, request a non-urgent call back, or ask for a work or school excuse.  You will get an e-mail in the next two days asking about your experience.  I hope that your e-visit has been valuable and will speed your recovery. Thank you for using e-visits.

## 2015-03-14 ENCOUNTER — Encounter: Payer: Self-pay | Admitting: Pharmacist

## 2015-04-14 ENCOUNTER — Other Ambulatory Visit: Payer: Self-pay

## 2015-05-14 ENCOUNTER — Encounter: Payer: Self-pay | Admitting: *Deleted

## 2015-05-22 ENCOUNTER — Ambulatory Visit: Payer: 59 | Admitting: Obstetrics and Gynecology

## 2015-05-30 ENCOUNTER — Ambulatory Visit (INDEPENDENT_AMBULATORY_CARE_PROVIDER_SITE_OTHER): Payer: Self-pay | Admitting: Family Medicine

## 2015-05-30 ENCOUNTER — Ambulatory Visit: Payer: 59 | Admitting: Pharmacist

## 2015-05-30 VITALS — BP 116/76 | Ht 63.0 in | Wt 264.6 lb

## 2015-05-30 DIAGNOSIS — E119 Type 2 diabetes mellitus without complications: Secondary | ICD-10-CM

## 2015-05-30 NOTE — Progress Notes (Signed)
Subjective:  Patient presents today for 3 month diabetes follow-up as part of the employer-sponsored Link to Wellness program.  Current diabetes regimen includes metformin 1000 mg ER BID & Invokana 300 mg daily.   Patient also continues on daily ACE Inhibitor and statin.  No med changes or major health changes at this time.   Last visit with Dr. Philip Aspen was in April. A1C at that visit was 5.5%. Next visit to see him is in October. Patient declined A1C test today.    Assessment/Plan:  Patient is a 59 y.o. female with DM 2. Most recent A1C was   5.5% which is at goal of less than 7%. Weight is increased by 17 pounds from last visit with me. This is likely due to patient stopping physical activity and relaxed eating habits.   CBG Review: Patient did not bring meter with her to appointment. Checking three times a week, usually fasting. Per patient report she is running 110-120. She has had a few in the 80s-90s. Highest was 145 mg/dL.   Denies hypoglycemia.   Lifestyle improvements:  Physical Activity-  She has not been exercising over the summer. Her husband has been out of town and work has been crazy.   She has been considering starting water aerobic classes at the [facility].   Nutrition-  She has been cooking less because of the heat. She is still measuring out portions. She does states she is bored with eating. She has an appointment with the RD in September to get more ideas on what to eat.    Follow up with me in 3 months.    Goals for Next Visit:  1. Get back into meal planning. Try 5 new recipes before you come back for your appointment.  2. Keep your appointment with the dietitian.  3. Restart physical activity once the weather cools down. Aim for 4 days a week for 30 minutes.     Next appointment to see me is: Friday February 10th at 4:30 PM.   Jinny Blossom D. Donneta Romberg, PharmD, BCPS, CDE [facility] to [place] Coordinator [phone]

## 2015-06-12 NOTE — Progress Notes (Signed)
ATTENDING PHYSICIAN NOTE: I have reviewed the chart and agree with the plan as detailed above. Charly Holcomb MD Pager [phone]  

## 2015-06-26 ENCOUNTER — Ambulatory Visit: Payer: 59 | Admitting: Obstetrics and Gynecology

## 2015-06-30 ENCOUNTER — Encounter: Payer: 59 | Attending: Internal Medicine | Admitting: Dietician

## 2015-06-30 DIAGNOSIS — Z6841 Body Mass Index (BMI) 40.0 and over, adult: Secondary | ICD-10-CM | POA: Insufficient documentation

## 2015-06-30 DIAGNOSIS — Z713 Dietary counseling and surveillance: Secondary | ICD-10-CM | POA: Insufficient documentation

## 2015-06-30 NOTE — Patient Instructions (Addendum)
Once mother gets better, plan to get back to exercising (gym or water fitness). Think about adding some splurge/treat foods in schedule (with traveling or goddaughters). Consider not writing down food if that is causing your more stress.  Continue making meal plan ahead of time.  Try having a protein shake (Isopure, Nectar) at a meal that you do not want to have another protein.  Check out Cooking Light for fast recipe ideas.

## 2015-06-30 NOTE — Progress Notes (Signed)
Medical Nutrition Therapy:  Appt start time: 4627 end time:  1725.   Assessment:  Primary concerns today: Cindy Carey is here today since she is trying to lose weight. Weight gain started in 1984 when she was treated for endometriosis and gained 70 lbs. Over the years worked night shift which made weight loss more difficult. Went on a (too) restrictive diet in 2004 and lost down to 184 lbs and regained it. Lost 40 lbs last year and regained it. Went to a class where she learned about carbs and fat content and was following that. Felt that diet became "boring". Had some personal issues come up which made following the diet too hard. Was eating more small meals last year and was more regimented than now.   Now keeping a food journal which is somewhat helpful. Back to doing meal prep on Sunday. Likes to [employer] but the kitchen gets hot in the summer.  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 Lives with her husband. She does most of the food shopping and meal preparation at home. Sometimes misses up to 2 meals per week. Eats out more in the summer and eats out on average about 3 x week. Gets lunch brought in once every other week.  Not a big meat eater. Recently stayed with her mother after she had bypass surgery until 9/1. Goes to visit her each weekend.   Feeling bored with what she is eating. Feeling frustrated since she isn't losing weight. Doesn't crave many sweets.   Preferred Learning Style:   No preference indicated   Learning Readiness:   Ready  MEDICATIONS: see list   DIETARY INTAKE:  Usual eating pattern includes 3 meals and 2-3 snacks per day.  Avoided foods include: grilled chicken, beef, shellfish (allergic), heavily spiced foods    24-hr recall:  B ( AM): eggs and egg white with cheese/spinach/ham sometimes with toast or cheese toast sometimes with black coffee  Snk ( AM): varies - almonds/walnuts or cereal  bar or yogurt or cheese or apple w/peanut butter L ( PM): casseroles with fruit or large vegetable salad or sandwich (peanut butter with banana or [place] with cucumber/humus/avocado) Snk ( PM): sometimes popcorn smart pop or almonds/walnuts or cereal bar or yogurt or cheese or apple w/peanut butter D ( PM): salad or fish or soup or sandwich, likes to have roasted brussels sprouts on the side  Snk ( PM): sometimes popcorn or lime sherbert with parents (when she was staying with them) Beverages: diet mountain dew (1 per day), unsweet tea, water, coffee sometimes  Usual physical activity: not recently  Estimated energy needs: 1600 calories 180 g carbohydrates 120 g protein 44 g fat  Progress Towards Goal(s):  In progress.   Nutritional Diagnosis:  [name] Overweight/obesity As related to hx of dietiting, inadequate physical activity, and slow metabolism.  As evidenced by BMI of 46.7.    Intervention:  Nutrition counseling provided. Plan: Once mother gets better, plan to get back to exercising (gym or water fitness). Think about adding some splurge/treat foods in schedule (with traveling or goddaughters). Consider not writing down food if that is causing your more stress.  Continue making meal plan ahead of time.  Try having a protein shake (Isopure, Nectar) at a meal that you do not want to have another protein.  Check out Cooking Light for fast recipe ideas.  Teaching Method Utilized:  Visual Auditory Hands on  Handouts given during visit include:  none  Barriers to learning/adherence to lifestyle change: stress, busy schedule  Demonstrated degree of understanding via:  Teach Back   Monitoring/Evaluation:  Dietary intake, exercise, and body weight in 2 month(s).

## 2015-07-03 ENCOUNTER — Ambulatory Visit (INDEPENDENT_AMBULATORY_CARE_PROVIDER_SITE_OTHER): Payer: 59 | Admitting: Obstetrics and Gynecology

## 2015-07-03 ENCOUNTER — Encounter: Payer: Self-pay | Admitting: Obstetrics and Gynecology

## 2015-07-03 ENCOUNTER — Ambulatory Visit: Payer: 59 | Admitting: Dietician

## 2015-07-03 VITALS — BP 136/84 | HR 88 | Resp 16 | Ht 63.0 in | Wt 261.0 lb

## 2015-07-03 DIAGNOSIS — Z23 Encounter for immunization: Secondary | ICD-10-CM | POA: Diagnosis not present

## 2015-07-03 DIAGNOSIS — N644 Mastodynia: Secondary | ICD-10-CM

## 2015-07-03 DIAGNOSIS — N6452 Nipple discharge: Secondary | ICD-10-CM | POA: Diagnosis not present

## 2015-07-03 DIAGNOSIS — Z01419 Encounter for gynecological examination (general) (routine) without abnormal findings: Secondary | ICD-10-CM

## 2015-07-03 DIAGNOSIS — L293 Anogenital pruritus, unspecified: Secondary | ICD-10-CM | POA: Diagnosis not present

## 2015-07-03 MED ORDER — BETAMETHASONE VALERATE 0.1 % EX OINT: TOPICAL_OINTMENT | CUTANEOUS | Status: DC

## 2015-07-03 NOTE — Progress Notes (Signed)
Edmund appointment for L Breast diagnostic 3D  Mammogram with ultrasound for 07/09/15 at [facility].

## 2015-07-03 NOTE — Progress Notes (Signed)
Patient ID: Cindy Carey, female   DOB: [birth year], 59 y.o.   MRN: [id number] 58 y.o. G0P0000 MarriedCaucasianF here for annual exam. Patient c/o left breast pain and discharge.  The breast pain and d/c started about 4 weeks. Only has noticed the d/c on her bra 5-6 times. The d/c is bloody. The pain is intermittent, dull, mild pain. Pt had TAH-BSO at 59yo for endometriosis. Pt was on estrogen until 59yo.She does have vulvar dryness. No vaginal bleeding.   Last HgbA1C was 5.2%, normal lipids on the medication. Creatinine was slightly elevated   Patient's last menstrual period was 10/18/1982.          Sexually active: No.  The current method of family planning is post menopausal status.    Exercising: Yes.    walking Smoker:  no  Health Maintenance: Pap:  2011?  History of abnormal Pap:  no MMG:  01-01-15 WNL  Colonoscopy:  2009 Normal  BMD:   2013, very mild osteopenia TDaP:  2006  Gardasil: N/A   reports that she has never smoked. She has never used smokeless tobacco. She reports that she does not drink alcohol or use illicit drugs.  Past Medical History  Diagnosis Date  . Diabetes mellitus without complication     [id number]  . Hyperlipidemia   . Hypertension   . Morbid obesity 09/04/12    BMI 49.4 kg/m^2   . History of endometriosis   . Blood transfusion without reported diagnosis 1983  . Dyspareunia   . Migraine with typical aura   . Injury of tendon of rotator cuff 2015    gym injury    Past Surgical History  Procedure Laterality Date  . Breath tek h pylori  08/28/2012    Procedure: BREATH TEK H PYLORI;  Surgeon: Pedro Earls, MD;  Location: [facility];  Service: General;  Laterality: N/A;  . Cholecystectomy  2007  . Breast lumpectomy  2010  . Breast biopsy  2010  . Ganglion cyst excision Left 1999    Foot  . Total abdominal hysterectomy w/ bilateral salpingoophorectomy  1984    endometriosis  . Exploratory laparotomy  1983    endomtriosis  . Abdominal  hysterectomy      Current Outpatient Prescriptions  Medication Sig Dispense Refill  . albuterol (PROVENTIL HFA;VENTOLIN HFA) 108 (90 BASE) MCG/ACT inhaler Inhale 2 puffs into the lungs every 6 (six) hours as needed for wheezing.    . Canagliflozin (INVOKANA) 300 MG TABS Take 1 tablet by mouth every morning.    . cyclobenzaprine (FLEXERIL) 10 MG tablet Take 10 mg by mouth daily as needed for muscle spasms.    [name] EPINEPHrine (EPI-PEN) 0.3 mg/0.3 mL SOAJ injection Inject 0.3 mg into the muscle once as needed.    [name] lisinopril (PRINIVIL,ZESTRIL) 10 MG tablet Take 10 mg by mouth daily.    . metFORMIN (GLUCOPHAGE-XR) 500 MG 24 hr tablet Take 1,000 mg by mouth 2 (two) times daily before a meal.    . Multiple Vitamins-Minerals (MULTIVITAMIN PO) Take 1 tablet by mouth daily.    . promethazine (PHENERGAN) 25 MG tablet Take 25 mg by mouth every 8 (eight) hours as needed for nausea.    . simvastatin (ZOCOR) 40 MG tablet Take 40 mg by mouth every evening.    . valACYclovir (VALTREX) 1000 MG tablet Take 2,000 mg by mouth 2 (two) times daily as needed.     No current facility-administered medications for this visit.    Family History  Problem  Relation Age of Onset  . Breast cancer Paternal Aunt     23's  . Diabetes Father   . Heart attack Father   . Diabetes Paternal Grandfather   . Osteoporosis Maternal Grandmother   . Thyroid disease Mother   . Heart disease Mother     Review of Systems  Constitutional: Negative.   HENT: Negative.   Eyes: Negative.   Respiratory: Negative.   Cardiovascular: Negative.   Gastrointestinal: Negative.   Endocrine: Negative.   Genitourinary: Negative.        Left breast pain and nipple discharge   Musculoskeletal: Negative.   Skin: Negative.   Allergic/Immunologic: Negative.   Neurological: Negative.   Psychiatric/Behavioral: Negative.     Exam:   BP 136/84 mmHg  Pulse 88  Resp 16  Ht 5\' 3"  (1.6 m)  Wt 261 lb (118.389 kg)  BMI 46.25 kg/m2  LMP  10/18/1982  Weight change: @WEIGHTCHANGE @ Height:   Height: 5\' 3"  (160 cm)  Ht Readings from Last 3 Encounters:  07/03/15 5\' 3"  (1.6 m)  06/30/15 5\' 3"  (1.6 m)  05/30/15 5\' 3"  (1.6 m)    General appearance: alert, cooperative and appears stated age Head: Normocephalic, without obvious abnormality, atraumatic Neck: no adenopathy, supple, symmetrical, trachea midline and thyroid normal to inspection and palpation Lungs: clear to auscultation bilaterally Breasts: normal appearance, no masses or tenderness, slight inversion of the left nipple (she states it has been like that since her last breast biopsy. Heart: regular rate and rhythm Abdomen: soft, non-tender; bowel sounds normal; no masses,  no organomegaly Extremities: extremities normal, atraumatic, no cyanosis or edema Skin: Skin color, texture, turgor normal. No rashes or lesions Lymph nodes: Cervical, supraclavicular, and axillary nodes normal. No abnormal inguinal nodes palpated Neurologic: Grossly normal   Pelvic: External genitalia:  no lesions              Urethra:  normal appearing urethra with no masses, tenderness or lesions              Bartholins and Skenes: normal                 Vagina: very atrophic, narrow vagina              Cervix: absent               Bimanual Exam:  Uterus:  uterus absent              Adnexa: no mass, fullness, tenderness               Rectovaginal: Confirms               Anus:  normal sphincter tone, no lesions  Chaperone was present for exam.  A:  Well Woman with normal exam  Mastalgia  Bloody nipple d/c  Genital pruritus, loss or architecture, +fissure, no whitening.   P:   No Pap needed  Diagnostic imaging of the left breast  Wet prep probe  Steroid ointment to the vulva x 1-2 weeks  Discussed vulvar skin care and the use of Vasaline  TDAP today  Labs with her primary

## 2015-07-03 NOTE — Patient Instructions (Signed)

## 2015-07-04 LAB — WET PREP BY MOLECULAR PROBE
Candida species: NEGATIVE
GARDNERELLA VAGINALIS: NEGATIVE
TRICHOMONAS VAG: NEGATIVE

## 2015-07-09 ENCOUNTER — Telehealth: Payer: Self-pay

## 2015-07-09 ENCOUNTER — Encounter: Payer: Self-pay | Admitting: Obstetrics and Gynecology

## 2015-07-09 DIAGNOSIS — N6452 Nipple discharge: Secondary | ICD-10-CM

## 2015-07-09 DIAGNOSIS — N644 Mastodynia: Secondary | ICD-10-CM

## 2015-07-09 NOTE — Telephone Encounter (Signed)
Spoke with patient. Patient is scheduled for left breast dx with u/s at [facility] on 9/28. Advised of message as seen below from [place]. Patient states that she has called [facility] multiple times and they do not have any earlier appointments available. Patient is agreeable to schedule with another location if she can get in sooner. Advised I will call over to [facility] to see if they have an appointment available earlier and return call. Patient is agreeable.  Spoke with [facility] first available appointment is Monday but per patient Monday's she is not available. Appointment scheduled for Tuesday 07/15/2015 at 1:30 pm.  Spoke with patient. Patient would like to keep appointment at [facility] on 07/15/2015 at 1:30 pm. Patient will cancel appointment at [facility] and have them send previous films to [facility].   Routing to provider for final review. Patient agreeable to disposition. Will close encounter.

## 2015-07-09 NOTE — Telephone Encounter (Signed)
Non-Urgent Medical Question  Message [phone]   From  KADIA ABAYA   To  [name], MD   Sent  07/09/2015 10:32 AM     Hello, Dr Talbert Nan,  I am writing about the upcoming mammogram/US that I have scheduled next [facility] 9/28. Although I am trying to not worry about it, I can't help but do so as I know the likelihood that this is not a benign problem. I had no symptoms in 2011 when I had the biopsy by Dr Donne Hazel, and it took about 2 months from the mammogram to the date of surgery. Therefore, I was wondering if it would be possible for you to go ahead and send a referral to Dr Donne Hazel so that the time period of waiting for an appt will be shortened?   I am not sleeping very much, with worry about my mother and now this probability of the problem being breast cancer. I think having a plan in place would help me some, as part of my worry is how to manage surgery, cancer treatments etc as well as taking care of her every weekend. I cannot let her know about this, as she is still to fragile to be upset, so I just want to get things rolling and done.   Thank you for your consideration.  Rockwell [place]      Responsible Party    Pool - [facility] Clinical Pool No one has taken responsibility for this message.     No actions have been taken on this message.

## 2015-07-09 NOTE — Telephone Encounter (Signed)
Please see if you can get her in sooner for the diagnostic mammogram. We can't refer to the surgeon until we have a diagnosis. Often radiology will do biopsies.

## 2015-07-09 NOTE — Telephone Encounter (Signed)
Telephone encounter created to discuss mychart message with Dr.Jertson.

## 2015-07-09 NOTE — Telephone Encounter (Signed)
Routing mychart message as seen below to [place] for review and advise.

## 2015-07-10 NOTE — Telephone Encounter (Signed)
Please see telephone encounter dated 07/09/2015.  Routing to Orlan Aversa for final review. Patient agreeable to disposition. Will close encounter.

## 2015-07-15 ENCOUNTER — Ambulatory Visit: Payer: 59 | Admitting: Internal Medicine

## 2015-07-15 ENCOUNTER — Ambulatory Visit
Admission: RE | Admit: 2015-07-15 | Discharge: 2015-07-15 | Disposition: A | Discharge status: Home / Self Care | Payer: 59 | Source: Ambulatory Visit | Attending: Obstetrics and Gynecology | Admitting: Obstetrics and Gynecology

## 2015-07-15 ENCOUNTER — Other Ambulatory Visit: Payer: Self-pay | Admitting: Obstetrics and Gynecology

## 2015-07-15 ENCOUNTER — Telehealth: Payer: Self-pay

## 2015-07-15 DIAGNOSIS — N644 Mastodynia: Secondary | ICD-10-CM

## 2015-07-15 DIAGNOSIS — N6452 Nipple discharge: Secondary | ICD-10-CM

## 2015-07-15 NOTE — Telephone Encounter (Signed)
Spoke with Jinny Blossom at [facility]. Patient is in their office today for diagnostic imaging of her left breast for bloody nipple discharge from left breast. Requesting a verbal order for a left breast biopsy as they have an opening today. Order given for left breast biopsy. Order also placed in EPIC for Dr.Jertson to sign off on.  Routing to Yuritzy Zehring for final review. Patient agreeable to disposition. Will close encounter.

## 2015-07-17 ENCOUNTER — Other Ambulatory Visit: Payer: Self-pay | Admitting: General Surgery

## 2015-07-17 ENCOUNTER — Encounter (HOSPITAL_BASED_OUTPATIENT_CLINIC_OR_DEPARTMENT_OTHER): Payer: Self-pay | Admitting: *Deleted

## 2015-07-17 DIAGNOSIS — N6092 Unspecified benign mammary dysplasia of left breast: Secondary | ICD-10-CM

## 2015-07-18 ENCOUNTER — Other Ambulatory Visit: Payer: Self-pay

## 2015-07-18 ENCOUNTER — Encounter (HOSPITAL_BASED_OUTPATIENT_CLINIC_OR_DEPARTMENT_OTHER)
Admission: RE | Admit: 2015-07-18 | Discharge: 2015-07-18 | Disposition: A | Discharge status: Home / Self Care | Payer: 59 | Source: Ambulatory Visit | Attending: General Surgery | Admitting: General Surgery

## 2015-07-18 DIAGNOSIS — E119 Type 2 diabetes mellitus without complications: Secondary | ICD-10-CM | POA: Diagnosis not present

## 2015-07-18 DIAGNOSIS — Z01818 Encounter for other preprocedural examination: Secondary | ICD-10-CM | POA: Insufficient documentation

## 2015-07-18 DIAGNOSIS — N63 Unspecified lump in breast: Secondary | ICD-10-CM | POA: Diagnosis not present

## 2015-07-18 DIAGNOSIS — I10 Essential (primary) hypertension: Secondary | ICD-10-CM | POA: Diagnosis not present

## 2015-07-18 DIAGNOSIS — D242 Benign neoplasm of left breast: Secondary | ICD-10-CM | POA: Diagnosis not present

## 2015-07-18 DIAGNOSIS — Z6841 Body Mass Index (BMI) 40.0 and over, adult: Secondary | ICD-10-CM | POA: Diagnosis not present

## 2015-07-18 LAB — BASIC METABOLIC PANEL
Anion gap: 7 (ref 5–15)
BUN: 12 mg/dL (ref 6–20)
CHLORIDE: 104 mmol/L (ref 101–111)
CO2: 27 mmol/L (ref 22–32)
CREATININE: 0.83 mg/dL (ref 0.44–1.00)
Calcium: 9.7 mg/dL (ref 8.9–10.3)
GFR calc Af Amer: >60 mL/min (ref >60)
GFR calc non Af Amer: >60 mL/min (ref >60)
Glucose, Bld: 131 mg/dL — ABNORMAL HIGH (ref 65–99)
POTASSIUM: 4.8 mmol/L (ref 3.5–5.1)
Sodium: 138 mmol/L (ref 135–145)

## 2015-07-18 LAB — CBC WITH DIFFERENTIAL/PLATELET
Basophils Absolute: 0 10*3/uL (ref 0.0–0.1)
Basophils Relative: 0 %
EOS ABS: 0.2 10*3/uL (ref 0.0–0.7)
EOS PCT: 2 %
HCT: 45.6 % (ref 36.0–46.0)
HEMOGLOBIN: 14.5 g/dL (ref 12.0–15.0)
LYMPHS ABS: 2.9 10*3/uL (ref 0.7–4.0)
LYMPHS PCT: 39 %
MCH: 30.3 pg (ref 26.0–34.0)
MCHC: 31.8 g/dL (ref 30.0–36.0)
MCV: 95.2 fL (ref 78.0–100.0)
MONOS PCT: 8 %
Monocytes Absolute: 0.6 10*3/uL (ref 0.1–1.0)
Neutro Abs: 3.8 10*3/uL (ref 1.7–7.7)
Neutrophils Relative %: 51 %
PLATELETS: 145 10*3/uL — AB (ref 150–400)
RBC: 4.79 MIL/uL (ref 3.87–5.11)
RDW: 14.2 % (ref 11.5–15.5)
WBC: 7.5 10*3/uL (ref 4.0–10.5)

## 2015-07-18 NOTE — Pre-Procedure Instructions (Signed)
Platelet result of 145 called to Abigail Butts at [facility]; she will notify Dr. Donne Hazel.

## 2015-07-18 NOTE — Consult Note (Signed)
Asked to see pt in pre-op consultation for breast surgery.  Pt has h/o HTN, Type 2 DM, morbid obesity (BMI 46). She reports good control of her HTN and DM.  She denies dyspnea, chest pain, palpitations with stairs or ADLs. She had a normal ECHO in 2005: normal LVF, EF 55-65%, valves OK.    VSS, MP 2 with adeq mouth opening, TM distance >3cm, FROM neck.   Lungs: clear to ausc   CV: no murmur   12 lead EKG: poor R wave progression, RBBB  Assessment: ASA 3 for GA  Plan: pt understands NPO after Midnight the night before her surgery, except for sip of water with her usual meds.  She understands not to take hypoglycemics on the morning of surgery, and to check her blood sugar at home if she feels low. Pt is prepared for GA for breast surgery at [facility].     Jenita Seashore, MD   [phone]

## 2015-07-21 ENCOUNTER — Ambulatory Visit
Admission: RE | Admit: 2015-07-21 | Discharge: 2015-07-21 | Disposition: A | Discharge status: Home / Self Care | Payer: 59 | Source: Ambulatory Visit | Attending: General Surgery | Admitting: General Surgery

## 2015-07-21 DIAGNOSIS — N6092 Unspecified benign mammary dysplasia of left breast: Secondary | ICD-10-CM

## 2015-07-23 ENCOUNTER — Ambulatory Visit
Admit: 2015-07-23 | Discharge: 2015-07-23 | Disposition: A | Discharge status: Home / Self Care | Payer: 59 | Attending: General Surgery | Admitting: General Surgery

## 2015-07-23 ENCOUNTER — Ambulatory Visit (HOSPITAL_BASED_OUTPATIENT_CLINIC_OR_DEPARTMENT_OTHER): Payer: 59 | Admitting: Certified Registered"

## 2015-07-23 ENCOUNTER — Ambulatory Visit (HOSPITAL_BASED_OUTPATIENT_CLINIC_OR_DEPARTMENT_OTHER)
Admission: RE | Admit: 2015-07-23 | Discharge: 2015-07-23 | Disposition: A | Discharge status: Home / Self Care | Payer: 59 | Source: Ambulatory Visit | Attending: General Surgery | Admitting: General Surgery

## 2015-07-23 ENCOUNTER — Encounter (HOSPITAL_BASED_OUTPATIENT_CLINIC_OR_DEPARTMENT_OTHER): Payer: Self-pay | Admitting: Certified Registered"

## 2015-07-23 ENCOUNTER — Encounter (HOSPITAL_BASED_OUTPATIENT_CLINIC_OR_DEPARTMENT_OTHER)
Admission: RE | Disposition: A | Discharge status: Home / Self Care | Payer: Self-pay | Source: Ambulatory Visit | Attending: General Surgery

## 2015-07-23 DIAGNOSIS — Z6841 Body Mass Index (BMI) 40.0 and over, adult: Secondary | ICD-10-CM | POA: Insufficient documentation

## 2015-07-23 DIAGNOSIS — E119 Type 2 diabetes mellitus without complications: Secondary | ICD-10-CM | POA: Insufficient documentation

## 2015-07-23 DIAGNOSIS — D242 Benign neoplasm of left breast: Secondary | ICD-10-CM | POA: Insufficient documentation

## 2015-07-23 DIAGNOSIS — I10 Essential (primary) hypertension: Secondary | ICD-10-CM | POA: Insufficient documentation

## 2015-07-23 HISTORY — PX: BREAST LUMPECTOMY WITH RADIOACTIVE SEED LOCALIZATION: SHX6424

## 2015-07-23 LAB — GLUCOSE, CAPILLARY
Glucose-Capillary: 126 mg/dL — ABNORMAL HIGH (ref 65–99)
Glucose-Capillary: 113 mg/dL — ABNORMAL HIGH (ref 65–99)

## 2015-07-23 SURGERY — BREAST LUMPECTOMY WITH RADIOACTIVE SEED LOCALIZATION
Anesthesia: General | Site: Breast | Laterality: Left

## 2015-07-23 MED ORDER — PROPOFOL 10 MG/ML IV BOLUS
INTRAVENOUS | Status: AC
  Filled 2015-07-23: qty 20

## 2015-07-23 MED ORDER — ONDANSETRON HCL 4 MG/2ML IJ SOLN
INTRAMUSCULAR | Status: DC | PRN
  Administered 2015-07-23: 4 mg via INTRAVENOUS

## 2015-07-23 MED ORDER — EPHEDRINE SULFATE 50 MG/ML IJ SOLN
INTRAMUSCULAR | Status: AC
  Filled 2015-07-23: qty 1

## 2015-07-23 MED ORDER — CIPROFLOXACIN IN D5W 400 MG/200ML IV SOLN
INTRAVENOUS | Status: AC
  Filled 2015-07-23: qty 200

## 2015-07-23 MED ORDER — OXYCODONE HCL 5 MG/5ML PO SOLN: 5.0000 mg | Freq: Once | ORAL | Status: AC | PRN

## 2015-07-23 MED ORDER — HYDROMORPHONE HCL 1 MG/ML IJ SOLN
INTRAMUSCULAR | Status: AC
  Filled 2015-07-23: qty 1

## 2015-07-23 MED ORDER — SCOPOLAMINE 1 MG/3DAYS TD PT72
1.0000 | MEDICATED_PATCH | Freq: Once | TRANSDERMAL | Status: DC | PRN
  Administered 2015-07-23: 1.5 mg via TRANSDERMAL

## 2015-07-23 MED ORDER — MIDAZOLAM HCL 2 MG/2ML IJ SOLN
INTRAMUSCULAR | Status: AC
  Filled 2015-07-23: qty 4

## 2015-07-23 MED ORDER — DEXAMETHASONE SODIUM PHOSPHATE 10 MG/ML IJ SOLN
INTRAMUSCULAR | Status: AC
  Filled 2015-07-23: qty 1

## 2015-07-23 MED ORDER — BUPIVACAINE HCL (PF) 0.25 % IJ SOLN
INTRAMUSCULAR | Status: DC | PRN
  Administered 2015-07-23: 10 mL

## 2015-07-23 MED ORDER — GLYCOPYRROLATE 0.2 MG/ML IJ SOLN: 0.2000 mg | Freq: Once | INTRAMUSCULAR | Status: DC | PRN

## 2015-07-23 MED ORDER — EPHEDRINE SULFATE 50 MG/ML IJ SOLN
INTRAMUSCULAR | Status: DC | PRN
  Administered 2015-07-23: 10 mg via INTRAVENOUS

## 2015-07-23 MED ORDER — FENTANYL CITRATE (PF) 100 MCG/2ML IJ SOLN
INTRAMUSCULAR | Status: AC
  Filled 2015-07-23: qty 4

## 2015-07-23 MED ORDER — HYDROCODONE-ACETAMINOPHEN 10-325 MG PO TABS: 1.0000 | ORAL_TABLET | Freq: Four times a day (QID) | ORAL | Status: DC | PRN

## 2015-07-23 MED ORDER — BUPIVACAINE-EPINEPHRINE (PF) 0.25% -1:200000 IJ SOLN
INTRAMUSCULAR | Status: AC
  Filled 2015-07-23: qty 30

## 2015-07-23 MED ORDER — OXYCODONE HCL 5 MG PO TABS
ORAL_TABLET | ORAL | Status: AC
  Filled 2015-07-23: qty 1

## 2015-07-23 MED ORDER — ONDANSETRON HCL 4 MG/2ML IJ SOLN
INTRAMUSCULAR | Status: AC
  Filled 2015-07-23: qty 2

## 2015-07-23 MED ORDER — SCOPOLAMINE 1 MG/3DAYS TD PT72
MEDICATED_PATCH | TRANSDERMAL | Status: AC
  Filled 2015-07-23: qty 1

## 2015-07-23 MED ORDER — HYDROMORPHONE HCL 1 MG/ML IJ SOLN
0.2500 mg | INTRAMUSCULAR | Status: DC | PRN
  Administered 2015-07-23 (×2): 0.5 mg via INTRAVENOUS

## 2015-07-23 MED ORDER — MEPERIDINE HCL 25 MG/ML IJ SOLN: 6.2500 mg | INTRAMUSCULAR | Status: DC | PRN

## 2015-07-23 MED ORDER — MIDAZOLAM HCL 5 MG/5ML IJ SOLN
INTRAMUSCULAR | Status: DC | PRN
  Administered 2015-07-23: 2 mg via INTRAVENOUS

## 2015-07-23 MED ORDER — FENTANYL CITRATE (PF) 100 MCG/2ML IJ SOLN
INTRAMUSCULAR | Status: DC | PRN
  Administered 2015-07-23: 100 ug via INTRAVENOUS

## 2015-07-23 MED ORDER — BUPIVACAINE HCL (PF) 0.25 % IJ SOLN
INTRAMUSCULAR | Status: AC
  Filled 2015-07-23: qty 30

## 2015-07-23 MED ORDER — DEXAMETHASONE SODIUM PHOSPHATE 4 MG/ML IJ SOLN
INTRAMUSCULAR | Status: DC | PRN
  Administered 2015-07-23: 4 mg via INTRAVENOUS

## 2015-07-23 MED ORDER — LIDOCAINE HCL (CARDIAC) 20 MG/ML IV SOLN
INTRAVENOUS | Status: DC | PRN
  Administered 2015-07-23: 80 mg via INTRAVENOUS

## 2015-07-23 MED ORDER — CIPROFLOXACIN IN D5W 400 MG/200ML IV SOLN
400.0000 mg | INTRAVENOUS | Status: AC
  Administered 2015-07-23: 400 mg via INTRAVENOUS

## 2015-07-23 MED ORDER — PROPOFOL 10 MG/ML IV BOLUS
INTRAVENOUS | Status: DC | PRN
  Administered 2015-07-23: 200 mg via INTRAVENOUS

## 2015-07-23 MED ORDER — LACTATED RINGERS IV SOLN
INTRAVENOUS | Status: DC
  Administered 2015-07-23: 09:00:00 via INTRAVENOUS

## 2015-07-23 MED ORDER — OXYCODONE HCL 5 MG PO TABS
5.0000 mg | ORAL_TABLET | Freq: Once | ORAL | Status: AC | PRN
  Administered 2015-07-23: 5 mg via ORAL

## 2015-07-23 SURGICAL SUPPLY — 59 items
APPLIER CLIP 9.375 MED OPEN (MISCELLANEOUS)
APR CLP MED 9.3 20 MLT OPN (MISCELLANEOUS)
BINDER BREAST LRG (GAUZE/BANDAGES/DRESSINGS) IMPLANT
BINDER BREAST MEDIUM (GAUZE/BANDAGES/DRESSINGS) IMPLANT
BINDER BREAST XLRG (GAUZE/BANDAGES/DRESSINGS) IMPLANT
BINDER BREAST XXLRG (GAUZE/BANDAGES/DRESSINGS) ×1 IMPLANT
BLADE SURG 15 STRL LF DISP TIS (BLADE) ×1 IMPLANT
BLADE SURG 15 STRL SS (BLADE) ×2
CANISTER SUC SOCK COL 7IN (MISCELLANEOUS) IMPLANT
CANISTER SUCT 1200ML W/VALVE (MISCELLANEOUS) IMPLANT
CHLORAPREP W/TINT 26ML (MISCELLANEOUS) ×2 IMPLANT
CLIP APPLIE 9.375 MED OPEN (MISCELLANEOUS) IMPLANT
COVER BACK TABLE 60X90IN (DRAPES) ×2 IMPLANT
COVER MAYO STAND STRL (DRAPES) ×2 IMPLANT
COVER PROBE W GEL 5X96 (DRAPES) ×2 IMPLANT
DECANTER SPIKE VIAL GLASS SM (MISCELLANEOUS) IMPLANT
DEVICE DUBIN W/COMP PLATE 8390 (MISCELLANEOUS) ×2 IMPLANT
DRAPE LAPAROSCOPIC ABDOMINAL (DRAPES) ×2 IMPLANT
DRSG TEGADERM 4X4.75 (GAUZE/BANDAGES/DRESSINGS) IMPLANT
ELECT COATED BLADE 2.86 ST (ELECTRODE) ×2 IMPLANT
ELECT REM PT RETURN 9FT ADLT (ELECTROSURGICAL) ×2
ELECTRODE REM PT RTRN 9FT ADLT (ELECTROSURGICAL) ×1 IMPLANT
GLOVE BIO SURGEON STRL SZ7 (GLOVE) ×4 IMPLANT
GLOVE BIOGEL PI IND STRL 7.0 (GLOVE) IMPLANT
GLOVE BIOGEL PI IND STRL 7.5 (GLOVE) ×1 IMPLANT
GLOVE BIOGEL PI INDICATOR 7.0 (GLOVE) ×1
GLOVE BIOGEL PI INDICATOR 7.5 (GLOVE) ×1
GLOVE ECLIPSE 6.5 STRL STRAW (GLOVE) ×1 IMPLANT
GLOVE EXAM NITRILE LRG STRL (GLOVE) ×1 IMPLANT
GOWN STRL REUS W/ TWL LRG LVL3 (GOWN DISPOSABLE) ×2 IMPLANT
GOWN STRL REUS W/TWL LRG LVL3 (GOWN DISPOSABLE) ×4
ILLUMINATOR WAVEGUIDE N/F (MISCELLANEOUS) IMPLANT
KIT MARKER MARGIN INK (KITS) ×2 IMPLANT
LIGHT WAVEGUIDE WIDE FLAT (MISCELLANEOUS) IMPLANT
LIQUID BAND (GAUZE/BANDAGES/DRESSINGS) ×2 IMPLANT
MARKER SKIN DUAL TIP RULER LAB (MISCELLANEOUS) ×2 IMPLANT
NDL HYPO 25X1 1.5 SAFETY (NEEDLE) ×1 IMPLANT
NEEDLE HYPO 25X1 1.5 SAFETY (NEEDLE) ×2 IMPLANT
NS IRRIG 1000ML POUR BTL (IV SOLUTION) IMPLANT
PACK BASIN DAY SURGERY FS (CUSTOM PROCEDURE TRAY) ×2 IMPLANT
PENCIL BUTTON HOLSTER BLD 10FT (ELECTRODE) ×2 IMPLANT
SLEEVE SCD COMPRESS KNEE MED (MISCELLANEOUS) ×2 IMPLANT
SPONGE GAUZE 4X4 12PLY STER LF (GAUZE/BANDAGES/DRESSINGS) IMPLANT
SPONGE LAP 4X18 X RAY DECT (DISPOSABLE) ×3 IMPLANT
STAPLER VISISTAT 35W (STAPLE) IMPLANT
STRIP CLOSURE SKIN 1/2X4 (GAUZE/BANDAGES/DRESSINGS) ×2 IMPLANT
SUT MNCRL AB 4-0 PS2 18 (SUTURE) ×1 IMPLANT
SUT MON AB 5-0 PS2 18 (SUTURE) IMPLANT
SUT SILK 2 0 SH (SUTURE) IMPLANT
SUT VIC AB 2-0 SH 27 (SUTURE) ×2
SUT VIC AB 2-0 SH 27XBRD (SUTURE) ×1 IMPLANT
SUT VIC AB 3-0 SH 27 (SUTURE) ×2
SUT VIC AB 3-0 SH 27X BRD (SUTURE) ×1 IMPLANT
SUT VIC AB 5-0 PS2 18 (SUTURE) ×1 IMPLANT
SYR CONTROL 10ML LL (SYRINGE) ×2 IMPLANT
TOWEL OR 17X24 6PK STRL BLUE (TOWEL DISPOSABLE) ×2 IMPLANT
TOWEL OR NON WOVEN STRL DISP B (DISPOSABLE) ×2 IMPLANT
TUBE CONNECTING 20X1/4 (TUBING) IMPLANT
YANKAUER SUCT BULB TIP NO VENT (SUCTIONS) IMPLANT

## 2015-07-23 NOTE — Interval H&P Note (Signed)
History and Physical Interval Note:  07/23/2015 9:49 AM  Cindy Carey  has presented today for surgery, with the diagnosis of LEFT BREAST MASS  The various methods of treatment have been discussed with the patient and family. After consideration of risks, benefits and other options for treatment, the patient has consented to  Procedure(s): LEFT BREAST SEED GUIDED EXCISION (Left) as a surgical intervention .  The patient's history has been reviewed, patient examined, no change in status, stable for surgery.  I have reviewed the patient's chart and labs.  Questions were answered to the patient's satisfaction.     [name]

## 2015-07-23 NOTE — H&P (Signed)
  83 yof I know from left breast papilloma excision previously presents with a couple month history of intermittent left bloody nipple dc. this was spontaneous. has not occurred in several weeks. no mass. had some tenderness before. underwent mm with new left sided subareolar mass. US showe an irregular intraductal mass. biopsy done showing papilloma with adh.    Other Problems Marjean Donna, CMA; 07/17/2015 1:49 PM) Diabetes Mellitus Hypercholesterolemia Migraine Headache  Past Surgical History Marjean Donna, CMA; 07/17/2015 1:49 PM) Appendectomy Breast Biopsy Left. Gallbladder Surgery - Laparoscopic Hysterectomy (not due to cancer) - Complete  Diagnostic Studies History Marjean Donna, CMA; 07/17/2015 1:49 PM) Colonoscopy 5-10 years ago  Allergies Davy Pique Bynum, CMA; 07/17/2015 1:51 PM) Shellfish-derived Products Penicillamine *ASSORTED CLASSES*  Medication History Davy Pique Bynum, CMA; 07/17/2015 1:52 PM) Cyclobenzaprine HCl (10MG  Tablet, Oral) Active. TRUEplus Lancets 30G Active. True Metrix Blood Glucose Test (In Vitro) Active. Betamethasone Valerate (0.1% Ointment, External) Active. MetFORMIN HCl (500MG  Tablet, Oral) Active. Multivitamins (Oral) Active. Simvastatin (40MG  Tablet, Oral) Active. Valtrex (500MG  Tablet, Oral) Active. Albuterol Sulfate (108 (90 Base)MCG/ACT Aero Pow Br Act, Inhalation as needed) Active. Medications Reconciled  Social History Marjean Donna, CMA; 07/17/2015 1:49 PM) Caffeine use Coffee. No alcohol use No drug use Tobacco use Never smoker.  Family History Marjean Donna, CMA; 07/17/2015 1:49 PM) Arthritis Mother. Breast Cancer Family Members In General. Cerebrovascular Accident Family Members In General. Diabetes Mellitus Father. Heart Disease Father. Hypertension Father, Mother. Respiratory Condition Family Members In General. Thyroid problems Mother.  Pregnancy / Birth History Marjean Donna, [place]; 07/17/2015 1:49 PM) Age  at menarche 59 years. Age of menopause <46 Gravida 0 Para 0  Review of Systems Davy Pique Bynum CMA; 07/17/2015 1:49 PM) General Not Present- Appetite Loss, Chills, Fatigue, Fever, Night Sweats, Weight Gain and Weight Loss. Skin Not Present- Change in Wart/Mole, Dryness, Hives, Jaundice, New Lesions, Non-Healing Wounds, Rash and Ulcer. Psychiatric Not Present- Anxiety, Bipolar, Change in Sleep Pattern, Depression, Fearful and Frequent crying.   Vitals (Sonya Bynum CMA; 07/17/2015 1:50 PM) 07/17/2015 1:49 PM Weight: 260 lb Height: 63in Body Surface Area: 2.29 m Body Mass Index: 46.06 kg/m Temp.: 66F(Temporal)  Pulse: 78 (Regular)  BP: 132/76 (Sitting, Left Arm, Standard)    Physical Exam Rolm Bookbinder MD; 07/17/2015 2:14 PM) General Mental Status-Alert. Orientation-Oriented X3.  Chest and Lung Exam Chest and lung exam reveals -on auscultation, normal breath sounds, no adventitious sounds and normal vocal resonance.  Breast Nipples-No Discharge. Breast Lump-No Palpable Breast Mass. Note: left breast periareolar incision well healed, partially inverted nipple since last surgery unchanged, I cannot express dc today   Cardiovascular Cardiovascular examination reveals -normal heart sounds, regular rate and rhythm with no murmurs.  Lymphatic Head & Neck  General Head & Neck Lymphatics: Bilateral - Description - Normal. Axillary  General Axillary Region: Bilateral - Description - Normal. Note: no [place] adenopathy     Assessment & Plan Rolm Bookbinder MD; 07/17/2015 2:16 PM) ATYPICAL DUCTAL HYPERPLASIA OF LEFT BREAST (N60.92) Story: Left breast seed guided excision We discussed due to bloody dc and adh recommend excision. discussed risk of upgrading path on excision. risks/recovery discussed, will do asap

## 2015-07-23 NOTE — Transfer of Care (Signed)
Immediate Anesthesia Transfer of Care Note  Patient: Cindy Carey  Procedure(s) Performed: Procedure(s): LEFT BREAST SEED GUIDED EXCISION (Left)  Patient Location: PACU  Anesthesia Type:General  Level of Consciousness: awake, alert  and oriented  Airway & Oxygen Therapy: Patient Spontanous Breathing and Patient connected to face mask oxygen  Post-op Assessment: Report given to RN, Post -op Vital signs reviewed and stable and Patient moving all extremities  Post vital signs: Reviewed and stable  Last Vitals:  Filed Vitals:   07/23/15 0816  BP: 126/61  Pulse: 78  Temp: 36.6 C  Resp: 18    Complications: No apparent anesthesia complications

## 2015-07-23 NOTE — Anesthesia Preprocedure Evaluation (Signed)
Anesthesia Evaluation  Patient identified by MRN, date of birth, ID band Patient awake and Patient confused    Airway Mallampati: I  TM Distance: >3 FB Neck ROM: Full    Dental  (+) Teeth Intact, Dental Advisory Given   Pulmonary    breath sounds clear to auscultation       Cardiovascular hypertension, Pt. on medications  Rhythm:Regular Rate:Normal     Neuro/Psych    GI/Hepatic   Endo/Other  diabetes, Well Controlled, Type 2, Oral Hypoglycemic AgentsMorbid obesity  Renal/GU      Musculoskeletal   Abdominal   Peds  Hematology   Anesthesia Other Findings   Reproductive/Obstetrics                             Anesthesia Physical Anesthesia Plan  ASA: III  Anesthesia Plan: General   Post-op Pain Management:    Induction: Intravenous  Airway Management Planned: LMA  Additional Equipment:   Intra-op Plan:   Post-operative Plan: Extubation in OR  Informed Consent: I have reviewed the patients History and Physical, chart, labs and discussed the procedure including the risks, benefits and alternatives for the proposed anesthesia with the patient or authorized representative who has indicated his/her understanding and acceptance.   Dental advisory given  Plan Discussed with: CRNA, Anesthesiologist and Surgeon  Anesthesia Plan Comments:         Anesthesia Quick Evaluation

## 2015-07-23 NOTE — Op Note (Signed)
Preoperative diagnosis: left breast adh and papilloma Postoperative diagnosis: same as above Procedure: left breast radioactive seed guided excisional biopsy Surgeon: Dr Serita Grammes Anesthesia: general EBL: minimal Drains none Complications none Specimen left breast tissue marked with paint Sponge and needle count was correct to completion Suspicion to recovery stable  Indications: This is 85 yof with left mammographic mass that on core biopsy is adh with papilloma.  We discussed rsl excision of the mass. The seed was placed prior to beginning. I had the mamograms in the OR  Procedure: After informed consent was obtained the patient was taken to the operating room. She was given antibiotics. Sequential compression devices were on her legs. She was then placed under general anesthesia without complication. She was prepped and draped in the standard sterile surgical fashion. A surgical timeout was then performed.  I infiltrated quarter percent Marcaine around the areola. I then made a periareolar incision. I then used the neoprobe to identify the radioactive seed and then remove this. The seed did come out of a small hematoma and was sent as a separate specimen. Hemostasis was observed. I painted the specimen. A mammogram was taken confirming removal of the clip. I did a separate mammogram of the seed and it was confirmed. There was no radioactivity remaining in the breast. I then closed this with 2-0 Vicryl, 3-0 Vicryl, and 5-0 Monocryl. Glue was placed over the wound. A breast binder was placed. She tolerated this well was extubated and transferred to recovery in stable condition

## 2015-07-23 NOTE — Discharge Instructions (Signed)
[facility] °Office Phone Number [phone] ° °POST OP INSTRUCTIONS ° °Always review your discharge instruction sheet given to you by the facility where your surgery was performed. ° °IF YOU HAVE DISABILITY OR FAMILY LEAVE FORMS, YOU MUST BRING THEM TO THE OFFICE FOR PROCESSING.  DO NOT GIVE THEM TO YOUR DOCTOR. ° °1. A prescription for pain medication may be given to you upon discharge.  Take your pain medication as prescribed, if needed.  If narcotic pain medicine is not needed, then you may take acetaminophen (Tylenol), naprosyn (Alleve) or ibuprofen (Advil) as needed. °2. Take your usually prescribed medications unless otherwise directed °3. If you need a refill on your pain medication, please contact your pharmacy.  They will contact our office to request authorization.  Prescriptions will not be filled after 5pm or on week-ends. °4. You should eat very light the first 24 hours after surgery, such as soup, crackers, pudding, etc.  Resume your normal diet the day after surgery. °5. Most patients will experience some swelling and bruising in the breast.  Ice packs and a good support bra will help.  Wear the breast binder provided or a sports bra for 72 hours day and night.  After that wear a sports bra during the day until you return to the office. Swelling and bruising can take several days to resolve.  °6. It is common to experience some constipation if taking pain medication after surgery.  Increasing fluid intake and taking a stool softener will usually help or prevent this problem from occurring.  A mild laxative (Milk of Magnesia or Miralax) should be taken according to package directions if there are no bowel movements after 48 hours. °7. Unless discharge instructions indicate otherwise, you may remove your bandages 48 hours after surgery and you may shower at that time.  You may have steri-strips (small skin tapes) in place directly over the incision.  These strips should be left on the  skin for 7-10 days and will come off on their own.  If your surgeon used skin glue on the incision, you may shower in 24 hours.  The glue will flake off over the next 2-3 weeks.  Any sutures or staples will be removed at the office during your follow-up visit. °8. ACTIVITIES:  You may resume regular daily activities (gradually increasing) beginning the next day.  Wearing a good support bra or sports bra minimizes pain and swelling.  You may have sexual intercourse when it is comfortable. °a. You may drive when you no longer are taking prescription pain medication, you can comfortably wear a seatbelt, and you can safely maneuver your car and apply brakes. °b. RETURN TO WORK:  ______________________________________________________________________________________ °9. You should see your doctor in the office for a follow-up appointment approximately two weeks after your surgery.  Your doctor’s nurse will typically make your follow-up appointment when she calls you with your pathology report.  Expect your pathology report 3-4 business days after your surgery.  You may call to check if you do not hear from us after three days. °10. OTHER INSTRUCTIONS: _______________________________________________________________________________________________ _____________________________________________________________________________________________________________________________________ °_____________________________________________________________________________________________________________________________________ °_____________________________________________________________________________________________________________________________________ ° °WHEN TO CALL DR WAKEFIELD: °1. Fever over 101.0 °2. Nausea and/or vomiting. °3. Extreme swelling or bruising. °4. Continued bleeding from incision. °5. Increased pain, redness, or drainage from the incision. ° °The clinic staff is available to answer your questions during regular  business hours.  Please don’t hesitate to call and ask to speak to one of the nurses for clinical concerns.  If   you have a medical emergency, go to the nearest emergency room or call 911.  A surgeon from [facility] is always on call at the hospital. ° °For further questions, please visit [email or web address] ° ° ° °Post Anesthesia Home Care Instructions ° °Activity: °Get plenty of rest for the remainder of the day. A responsible adult should stay with you for 24 hours following the procedure.  °For the next 24 hours, DO NOT: °-Drive a car °-Operate machinery °-Drink alcoholic beverages °-Take any medication unless instructed by your physician °-Make any legal decisions or sign important papers. ° °Meals: °Start with liquid foods such as gelatin or soup. Progress to regular foods as tolerated. Avoid greasy, spicy, heavy foods. If nausea and/or vomiting occur, drink only clear liquids until the nausea and/or vomiting subsides. Call your physician if vomiting continues. ° °Special Instructions/Symptoms: °Your throat may feel dry or sore from the anesthesia or the breathing tube placed in your throat during surgery. If this causes discomfort, gargle with warm salt water. The discomfort should disappear within 24 hours. ° °If you had a scopolamine patch placed behind your ear for the management of post- operative nausea and/or vomiting: ° °1. The medication in the patch is effective for 72 hours, after which it should be removed.  Wrap patch in a tissue and discard in the trash. Wash hands thoroughly with soap and water. °2. You may remove the patch earlier than 72 hours if you experience unpleasant side effects which may include dry mouth, dizziness or visual disturbances. °3. Avoid touching the patch. Wash your hands with soap and water after contact with the patch. °  ° °

## 2015-07-23 NOTE — Anesthesia Postprocedure Evaluation (Signed)
  Anesthesia Post-op Note  Patient: Cindy Carey  Procedure(s) Performed: Procedure(s): LEFT BREAST SEED GUIDED EXCISION (Left)  Patient Location: PACU  Anesthesia Type: General   Level of Consciousness: awake, alert  and oriented  Airway and Oxygen Therapy: Patient Spontanous Breathing  Post-op Pain: mild  Post-op Assessment: Post-op Vital signs reviewed  Post-op Vital Signs: Reviewed  Last Vitals:  Filed Vitals:   07/23/15 1052  BP:   Pulse: 100  Temp:   Resp: 21    Complications: No apparent anesthesia complications

## 2015-07-23 NOTE — Anesthesia Procedure Notes (Signed)
Procedure Name: LMA Insertion Date/Time: 07/23/2015 10:10 AM Performed by: Baxter Flattery Pre-anesthesia Checklist: Patient identified, Emergency Drugs available, Suction available and Patient being monitored Patient Re-evaluated:Patient Re-evaluated prior to inductionOxygen Delivery Method: Circle System Utilized Preoxygenation: Pre-oxygenation with 100% oxygen Intubation Type: IV induction Ventilation: Mask ventilation without difficulty LMA: LMA inserted LMA Size: 4.0 Number of attempts: 1 Airway Equipment and Method: Bite block Placement Confirmation: positive ETCO2 and breath sounds checked- equal and bilateral Tube secured with: Tape Dental Injury: Teeth and Oropharynx as per pre-operative assessment

## 2015-07-24 ENCOUNTER — Encounter (HOSPITAL_BASED_OUTPATIENT_CLINIC_OR_DEPARTMENT_OTHER): Payer: Self-pay | Admitting: General Surgery

## 2015-07-24 ENCOUNTER — Ambulatory Visit: Payer: 59 | Admitting: Internal Medicine

## 2015-07-25 ENCOUNTER — Other Ambulatory Visit: Payer: Self-pay

## 2015-07-25 NOTE — Anesthesia Postprocedure Evaluation (Signed)
  Anesthesia Post-op Note  Patient: Cindy Carey  Procedure(s) Performed: Procedure(s): LEFT BREAST SEED GUIDED EXCISION (Left)  Patient Location: PACU  Anesthesia Type: General   Level of Consciousness: awake, alert  and oriented  Airway and Oxygen Therapy: Patient Spontanous Breathing  Post-op Pain: mild  Post-op Assessment: Post-op Vital signs reviewed  Post-op Vital Signs: Reviewed  Last Vitals:  Filed Vitals:   07/23/15 1212  BP:   Pulse: 70  Temp: 36.8 C  Resp: 18    Complications: No apparent anesthesia complications

## 2015-07-28 ENCOUNTER — Telehealth: Payer: Self-pay | Admitting: Cardiovascular Disease

## 2015-07-28 NOTE — Telephone Encounter (Signed)
Received records from [facility] for appointment on 08/12/15 with Dr Oval Linsey.  Records given to [facility] (medical records) for Dr Blenda Mounts schedule on 08/12/15. lp

## 2015-07-31 ENCOUNTER — Encounter: Payer: Self-pay | Admitting: Obstetrics and Gynecology

## 2015-08-01 ENCOUNTER — Telehealth: Payer: Self-pay | Admitting: Emergency Medicine

## 2015-08-01 NOTE — Telephone Encounter (Signed)
Call to patient. Advised received mychart message and advised office visit recommended if has used Valisone ointment and still having symptoms after home care. Patient states she is not able to talk at this time and would like to discuss directly with Dr. Talbert Nan. Patient declines office visit at this time.

## 2015-08-01 NOTE — Telephone Encounter (Signed)
Chief Complaint  Patient presents with  . Advice Only    Patient sent mychart message     ===View-only below this line===   ----- Message -----    From: Joelyn Oms    Sent: 07/31/2015 12:04 PM EDT      To: [name], MD Subject: Non-Urgent Medical Question  Hello,  Thank you for your call about the breast biopsy. I am doing well, and very happy that [facility] and Dr Cristal Generous office got everything done so quickly.  My question for you is about vulvar itching. I used the steroid ointment for about 7 days, and that worked well, skin improved, no irritation etc. I have been using Vaseline since then as instructed. For last 1-2 wks, I have had problem with severe itching and very intense at night. It wakes me at night and I have tried various things such as tepid bathing of the area before bed, applying cool packs when it is severe (to try to avoid scratching in my sleep), but I still wake up several times at night with severe itching. All very external, nothing vaginal, no discharge etc. Some itching during the day, but not as bad. I have had episodes before, but it was just a day or so, and I could relate it to something, such as wearing a bathing suit all day. Any recommendations or just hope it settles down soon?  Thanks, [name]

## 2015-08-01 NOTE — Telephone Encounter (Signed)
Called patient. She states she wanted to talk with Dr. Talbert Nan directly when she is back in the office. She declined office visit for evaluation. Sending mychart message to Dr. Talbert Nan for review.

## 2015-08-05 NOTE — Telephone Encounter (Signed)
I called the patient and left a message to call back.  ? ?

## 2015-08-06 ENCOUNTER — Telehealth: Payer: Self-pay | Admitting: Emergency Medicine

## 2015-08-06 ENCOUNTER — Encounter: Payer: Self-pay | Admitting: Obstetrics and Gynecology

## 2015-08-06 NOTE — Telephone Encounter (Signed)
Patient sent mychart message. States problem is resolved. Responded back to patient and advised to call with any further concerns.  Will send update to Dr. Talbert Nan and close encounter.

## 2015-08-06 NOTE — Telephone Encounter (Signed)
Telephone call for triage created to discuss message with patient and disposition as appropriate.   

## 2015-08-06 NOTE — Telephone Encounter (Signed)
Chief Complaint  Patient presents with  . Advice Only    Patient sent mychart message     ===View-only below this line===   ----- Message -----    From: Joelyn Oms    Sent: 08/06/2015  9:48 AM EDT      To: [name], MD Subject: Non-Urgent Medical Question  Thank you for calling me yesterday. Unfortunately, with our office design, I do not have a way to have private phone conversations, and I don't really want to talk about my personal itching difficulties in front of other staff. After giving myself a couple of painful little cuts scratching in my sleep last week, I resorted to trying Desitin over the weekend. That helped immensely, both with itching and helping the cuts to heal. So I am hoping that whatever caused the severe itching has resolved and won't be a problem again - at least for awhile.   Thank you again for calling me and I apologize that I was not able to receive your call.  Sincerely, Cindy Carey

## 2015-08-12 ENCOUNTER — Ambulatory Visit: Payer: 59 | Admitting: Cardiovascular Disease

## 2015-08-12 NOTE — Progress Notes (Signed)
Cardiology Office Note   Date:  08/13/2015   ID:  Cindy, Carey Jul 03, 1956, MRN [id number]  PCP:  Donnajean Lopes, MD  Cardiologist:   Sharol Harness, MD   Chief Complaint  Patient presents with  . New Evaluation    referred by PCP for abnormal EKG (in Epic 07/18/15)//pt states no Sx.      History of Present Illness: Cindy Carey is a 59 y.o. female with hypertension, hyperlipidemia, diabetes and morbid obesity who presents for evaluation of an abnormal EKG.  Cindy Carey was seen by anesthesia for pre-operative assessment was noted to have an incomplete bundle branch block.  Given that she has a strong family history of heart disease, she requested follow up with cardiology.  In the interim she has undergone her breast surgery without complication. This summer she had 3 family members who underwent coronary artery bypass grafting. Her mother had a CABG August 2016 and 2 female cousins age 23 and 84 also had bypass.  Cindy Carey has been feeling well. She denies any chest pain, shortness of breath, lightheadedness or dizziness. She is also not noted any lower extremity edema, orthopnea or PND. She tries to walk 20-30 minutes 3-4 days per week, but has difficulty doing this due to her busy work schedule. She also has left knee pain. Are to her mother's bypass surgery she was planning to join the [employer] to start water aerobics. However she is not been able to do this yet.  She follows a diabetic diet and her most recent hemoglobin A1c was 5.2.   Past Medical History  Diagnosis Date  . Diabetes mellitus without complication (HCC)     B3ID  . Hyperlipidemia   . Hypertension   . Morbid obesity ([place]) 09/04/12    BMI 49.4 kg/m^2   . History of endometriosis   . Blood transfusion without reported diagnosis 1983  . Dyspareunia   . Migraine with typical aura   . Injury of tendon of rotator cuff 2015    gym injury    Past Surgical History  Procedure  Laterality Date  . Breath tek h pylori  08/28/2012    Procedure: BREATH TEK H PYLORI;  Surgeon: Pedro Earls, MD;  Location: [facility];  Service: General;  Laterality: N/A;  . Cholecystectomy  2007  . Breast lumpectomy  2010  . Breast biopsy  2010  . Ganglion cyst excision Left 1999    Foot  . Total abdominal hysterectomy w/ bilateral salpingoophorectomy  1984    endometriosis  . Exploratory laparotomy  1983    endomtriosis  . Abdominal hysterectomy    . Breast lumpectomy with radioactive seed localization Left 07/23/2015    Procedure: LEFT BREAST SEED GUIDED EXCISION;  Surgeon: Rolm Bookbinder, MD;  Location: [facility];  Service: General;  Laterality: Left;     Current Outpatient Prescriptions  Medication Sig Dispense Refill  . albuterol (PROVENTIL HFA;VENTOLIN HFA) 108 (90 BASE) MCG/ACT inhaler Inhale 2 puffs into the lungs every 6 (six) hours as needed for wheezing.    . Canagliflozin (INVOKANA) 300 MG TABS Take 1 tablet by mouth every morning.    . cyclobenzaprine (FLEXERIL) 10 MG tablet Take 10 mg by mouth daily as needed for muscle spasms.    [name] EPINEPHrine (EPI-PEN) 0.3 mg/0.3 mL SOAJ injection Inject 0.3 mg into the muscle once as needed.    [name] lisinopril (PRINIVIL,ZESTRIL) 10 MG tablet Take 10 mg by mouth daily.    [name]  metFORMIN (GLUCOPHAGE-XR) 500 MG 24 hr tablet Take 1,000 mg by mouth 2 (two) times daily before a meal.    . Multiple Vitamins-Minerals (MULTIVITAMIN PO) Take 1 tablet by mouth daily.    . promethazine (PHENERGAN) 25 MG tablet Take 25 mg by mouth every 8 (eight) hours as needed for nausea.    . simvastatin (ZOCOR) 40 MG tablet Take 40 mg by mouth every evening.    . TRUE METRIX BLOOD GLUCOSE TEST test strip 1 strip by Does not apply route 3 (three) times a week.  9  . TRUEPLUS LANCETS 30G MISC Inject 1 Device as directed 3 (three) times a week.  12  . valACYclovir (VALTREX) 1000 MG tablet Take 2,000 mg by mouth 2 (two) times daily as needed.      No current facility-administered medications for this visit.    Allergies:   Bee venom; Shellfish allergy; and Penicillins    Social History:  The patient  reports that she has never smoked. She has never used smokeless tobacco. She reports that she does not drink alcohol or use illicit drugs.   Family History:  The patient's family history includes Breast cancer in her paternal aunt; Diabetes in her father and paternal grandfather; Heart attack in her father; Heart disease in her mother; Osteoporosis in her maternal grandmother; Thyroid disease in her mother.    ROS:  Please see the history of present illness.   Otherwise, review of systems are positive for none.   All other systems are reviewed and negative.    PHYSICAL EXAM: VS:  BP 134/92 mmHg  Pulse 80  Ht 5\' 3"  (1.6 m)  Wt 118.842 kg (262 lb)  BMI 46.42 kg/m2  LMP 10/18/1982 , BMI Body mass index is 46.42 kg/(m^2). GENERAL:  Well appearing. Obese HEENT:  Pupils equal round and reactive, fundi not visualized, oral mucosa unremarkable NECK:  No jugular venous distention, waveform within normal limits, carotid upstroke brisk and symmetric, no bruits, no thyromegaly LYMPHATICS:  No cervical adenopathy LUNGS:  Clear to auscultation bilaterally HEART:  RRR.  PMI not displaced or sustained,S1 and S2 within normal limits, no S3, no S4, no clicks, no rubs, no murmurs ABD:  Flat, positive bowel sounds normal in frequency in pitch, no bruits, no rebound, no guarding, no midline pulsatile mass, no hepatomegaly, no splenomegaly EXT:  2 plus pulses throughout, no edema, no cyanosis no clubbing SKIN:  No rashes no nodules NEURO:  Cranial nerves II through XII grossly intact, motor grossly intact throughout PSYCH:  Cognitively intact, oriented to person place and time  EKG:  EKG is not ordered today. The ekg ordered 07/18/15 shows sinus rhythm at 70 bpm.  Left anterior fascicular block. Incomplete right bundle branch block. Low voltage in  the precordial leads.   Recent Labs: 07/18/2015: BUN 12; Creatinine, Ser 0.83; Hemoglobin 14.5; Platelets 145*; Potassium 4.8; Sodium 138    Lipid Panel No results found for: CHOL, TRIG, HDL, CHOLHDL, VLDL, LDLCALC, LDLDIRECT    Wt Readings from Last 3 Encounters:  08/13/15 118.842 kg (262 lb)  07/23/15 117.663 kg (259 lb 6.4 oz)  07/03/15 118.389 kg (261 lb)      ASSESSMENT AND PLAN:  # Incomplete RBBB: Ms. Kathie Rhodes has had an incomplete right bundle branch block since at least 2013. This in and of itself is not a risk factor for obstructive coronary artery disease. Given that she is not having any chest pain or exertional symptoms, we will not refer her for stress testing  at this time.   # Hypertension: Blood pressure slightly above goal today. She reports it is typically less than 140/90. We will not make any changes at this time. She will continue lisinopril 10 mg daily. If he continues to be elevated this will need to be increased to 20 mg.   # Hyperlipidemia: Her lipids are managed by her PCP. She will forward a copy of her most recent lipid panel. For now continue simvastatin 40 mg daily.   # obesity: We discussed the need to increase her physical activity for 30-40 minutes most days of the week. I encouraged her to pursue the water aerobics class, as this would be treated for her joints and also provide aerobic activity. She will continue to work on dietary modifications.   Current medicines are reviewed at length with the patient today.  The patient does not have concerns regarding medicines.  The following changes have been made:  no change  Labs/ tests ordered today include:  No orders of the defined types were placed in this encounter.     Disposition:   FU with Shalissa Easterwood C. Oval Linsey, MD in 1 year.    Signed, Sharol Harness, MD  08/13/2015 12:48 PM    [place]

## 2015-08-13 ENCOUNTER — Encounter: Payer: Self-pay | Admitting: Cardiovascular Disease

## 2015-08-13 ENCOUNTER — Ambulatory Visit (INDEPENDENT_AMBULATORY_CARE_PROVIDER_SITE_OTHER): Payer: 59 | Admitting: Cardiovascular Disease

## 2015-08-13 ENCOUNTER — Telehealth: Payer: Self-pay | Admitting: Hematology and Oncology

## 2015-08-13 ENCOUNTER — Telehealth: Payer: Self-pay | Admitting: *Deleted

## 2015-08-13 VITALS — BP 134/92 | HR 80 | Ht 63.0 in | Wt 262.0 lb

## 2015-08-13 DIAGNOSIS — I4510 Unspecified right bundle-branch block: Secondary | ICD-10-CM

## 2015-08-13 DIAGNOSIS — I10 Essential (primary) hypertension: Secondary | ICD-10-CM | POA: Diagnosis not present

## 2015-08-13 DIAGNOSIS — I450 Right fascicular block: Secondary | ICD-10-CM | POA: Diagnosis not present

## 2015-08-13 DIAGNOSIS — E785 Hyperlipidemia, unspecified: Secondary | ICD-10-CM | POA: Diagnosis not present

## 2015-08-13 NOTE — Telephone Encounter (Signed)
Received referral from [facility].  Called and left a message for the pt to return my call so I can schedule her.

## 2015-08-13 NOTE — Telephone Encounter (Signed)
S/w patient and r/s appt to 10/31 @ 1. Appt confirmed

## 2015-08-13 NOTE — Patient Instructions (Signed)
Dr Hubbard Hartshorn recommends that you schedule a follow-up appointment in 1 year. You will receive a reminder letter in the mail two months in advance. If you don't receive a letter, please call our office to schedule the follow-up appointment.  If you need a refill on your cardiac medications before your next appointment, please call your pharmacy.

## 2015-08-18 ENCOUNTER — Ambulatory Visit (HOSPITAL_BASED_OUTPATIENT_CLINIC_OR_DEPARTMENT_OTHER): Payer: 59 | Admitting: Hematology and Oncology

## 2015-08-18 ENCOUNTER — Encounter: Payer: Self-pay | Admitting: Hematology and Oncology

## 2015-08-18 VITALS — BP 133/83 | HR 78 | Temp 98.2°F | Resp 18 | Ht 63.0 in | Wt 262.6 lb

## 2015-08-18 DIAGNOSIS — D242 Benign neoplasm of left breast: Secondary | ICD-10-CM

## 2015-08-18 DIAGNOSIS — N6092 Unspecified benign mammary dysplasia of left breast: Secondary | ICD-10-CM

## 2015-08-18 NOTE — Assessment & Plan Note (Signed)
Left breast intraductal papilloma with atypical ductal hyperplasia diagnosed 07/15/2015 status post lumpectomy 07/23/2015  Pathology review: I discussed with the patient extensively the difference between atypical ductal hyperplasia and other noninvasive cancer site DCIS. We also explained the difference. Papilloma and atypical ductal hyperplasia.  Prognosis: Based upon American Cancer Society breast cancer risk assessment tool, her five-year risk of breast cancer that 4.6%, and average woman's risk would be 1.9%: Her lifetime risk of breast cancer would be 25%, average risk would be 9.6%  Based on NSABP P1 clinical trial, tamoxifen decreases the risk of breast cancer recurrence by half. So she took 5 years of tamoxifen, the risk of breast cancer in 5 years would be 2.3% and lifetime would be 12.5%. I discussed with her that there would not be any difference in survival.   Tamoxifen toxicities counseling:We discussed the risks and benefits of tamoxifen. These include but not limited to insomnia, hot flashes, mood changes, vaginal dryness, and weight gain. Although rare, serious side effects including risk of blood clots were also discussed.   If she decides to go on tamoxifen, we will make a follow-up appointment in 3 months. If she decides to go on surveillance, she will make appointments to follow with her primary care physician and her surgeon for surveillance and follow-up.

## 2015-08-18 NOTE — Addendum Note (Signed)
Addended by: Prentiss Bells on: 08/18/2015 02:24 PM   Modules accepted: Orders, Medications

## 2015-08-18 NOTE — Telephone Encounter (Signed)
Return call to [place].

## 2015-08-18 NOTE — Progress Notes (Signed)
New patient intake form rcvd from patient.  Reviewed by Dr. Lindi Adie.  Chart updated.  Sent to scan.

## 2015-08-18 NOTE — Progress Notes (Signed)
[place] NOTE  Patient Care Team: Leanna Battles, MD as PCP - General (Internal Medicine) Bryson Ha Himmelrich, RD as Dietitian ([place]) Joretta Bachelor, [facility] as [place] Management (Pharmacist)  CHIEF COMPLAINTS/PURPOSE OF CONSULTATION:  Newly diagnosed intraductal papilloma with atypical ductal hyperplasia  HISTORY OF PRESENTING ILLNESS:  Cindy Carey 59 y.o. female is here because of recent diagnosis of left breast intraductal papilloma with atypical ductal hyperplasia. She has had long-standing mammograms which were being observed every 6 month for evaluation. Recently she noticed some nipple discharge in the left breast. She subsequently underwent a mammogram and ultrasound and biopsy. Biopsy came back as intraductal papilloma with atypical ductal hyperplasia.  In terms of breast cancer risk profile:  She menarched at early age of 31 and had a hysterectomy in 1984 with bilateral salpingo-oophorectomy for endometriosis  She had no pregnancies She has not received birth control pills She was exposed to hormone replacement therapy for 20 years She has  family history of Breast/GYN/GI cancer Her great aunt on paternal side had breast cancer in 25s, maternal grandfather lung cancer, maternal uncle lung cancer age 42, maternal aunt lung cancer age 66, paternal grandmother age 47 with skin cancer  MEDICAL HISTORY:  Past Medical History  Diagnosis Date  . Diabetes mellitus without complication (HCC)     R1VQ  . Hyperlipidemia   . Hypertension   . Morbid obesity ([place]) 09/04/12    BMI 49.4 kg/m^2   . History of endometriosis   . Blood transfusion without reported diagnosis 1983  . Dyspareunia   . Migraine with typical aura   . Injury of tendon of rotator cuff 2015    gym injury    SURGICAL HISTORY: Past Surgical History  Procedure Laterality Date  . Breath tek h pylori  08/28/2012    Procedure: BREATH TEK H PYLORI;  Surgeon:  Pedro Earls, MD;  Location: [facility];  Service: General;  Laterality: N/A;  . Cholecystectomy  2007  . Breast lumpectomy  2010  . Breast biopsy  2010  . Ganglion cyst excision Left 1999    Foot  . Total abdominal hysterectomy w/ bilateral salpingoophorectomy  1984    endometriosis  . Exploratory laparotomy  1983    endomtriosis  . Abdominal hysterectomy    . Breast lumpectomy with radioactive seed localization Left 07/23/2015    Procedure: LEFT BREAST SEED GUIDED EXCISION;  Surgeon: Rolm Bookbinder, MD;  Location: [facility];  Service: General;  Laterality: Left;    SOCIAL HISTORY: Social History   Social History  . Marital Status: Married    Spouse Name: N/A  . Number of Children: N/A  . Years of Education: N/A   Occupational History  . Not on file.   Social History Main Topics  . Smoking status: Never Smoker   . Smokeless tobacco: Never Used  . Alcohol Use: No  . Drug Use: No  . Sexual Activity: No   Other Topics Concern  . Not on file   Social History Narrative   Epworth Sleepiness Scale = 1 (as of 08/13/15)    FAMILY HISTORY: Family History  Problem Relation Age of Onset  . Breast cancer Paternal Aunt     33's  . Diabetes Father   . Heart attack Father   . Heart disease Father   . Diabetes Paternal Grandfather   . Heart disease Paternal Grandfather   . Osteoporosis Maternal Grandmother   . Heart failure Maternal  Grandmother   . Thyroid disease Mother   . Heart disease Mother   . Hypertension Mother   . Pneumonia Sister   . Cancer Maternal Grandfather   . Dementia Paternal Grandmother     ALLERGIES:  is allergic to bee venom; shellfish allergy; and penicillins.  MEDICATIONS:  Current Outpatient Prescriptions  Medication Sig Dispense Refill  . albuterol (PROVENTIL HFA;VENTOLIN HFA) 108 (90 BASE) MCG/ACT inhaler Inhale 2 puffs into the lungs every 6 (six) hours as needed for wheezing.    . Canagliflozin (INVOKANA) 300 MG  TABS Take 1 tablet by mouth every morning.    . cyclobenzaprine (FLEXERIL) 10 MG tablet Take 10 mg by mouth daily as needed for muscle spasms.    [name] EPINEPHrine (EPI-PEN) 0.3 mg/0.3 mL SOAJ injection Inject 0.3 mg into the muscle once as needed.    [name] lisinopril (PRINIVIL,ZESTRIL) 10 MG tablet Take 10 mg by mouth daily.    . metFORMIN (GLUCOPHAGE-XR) 500 MG 24 hr tablet Take 1,000 mg by mouth 2 (two) times daily before a meal.    . Multiple Vitamins-Minerals (MULTIVITAMIN PO) Take 1 tablet by mouth daily.    . promethazine (PHENERGAN) 25 MG tablet Take 25 mg by mouth every 8 (eight) hours as needed for nausea.    . simvastatin (ZOCOR) 40 MG tablet Take 40 mg by mouth every evening.    . TRUE METRIX BLOOD GLUCOSE TEST test strip 1 strip by Does not apply route 3 (three) times a week.  9  . TRUEPLUS LANCETS 30G MISC Inject 1 Device as directed 3 (three) times a week.  12  . valACYclovir (VALTREX) 1000 MG tablet Take 2,000 mg by mouth 2 (two) times daily as needed.     No current facility-administered medications for this visit.    REVIEW OF SYSTEMS:   Constitutional: Denies fevers, chills or abnormal night sweats Eyes: Denies blurriness of vision, double vision or watery eyes Ears, nose, mouth, throat, and face: Denies mucositis or sore throat Respiratory: Denies cough, dyspnea or wheezes Cardiovascular: Denies palpitation, chest discomfort or lower extremity swelling Gastrointestinal:  Denies nausea, heartburn or change in bowel habits Skin: Denies abnormal skin rashes Lymphatics: Denies new lymphadenopathy or easy bruising Neurological:Denies numbness, tingling or new weaknesses Behavioral/Psych: Mood is stable, no new changes  Breast:  Denies any palpable lumps or discharge All other systems were reviewed with the patient and are negative.  PHYSICAL EXAMINATION: ECOG PERFORMANCE STATUS: 1 - Symptomatic but completely ambulatory  Filed Vitals:   08/18/15 1245  BP: 133/83  Pulse: 78   Temp: 98.2 F (36.8 C)  Resp: 18   Filed Weights   08/18/15 1245  Weight: 262 lb 9.6 oz (119.115 kg)    GENERAL:alert, no distress and comfortable SKIN: skin color, texture, turgor are normal, no rashes or significant lesions EYES: normal, conjunctiva are pink and non-injected, sclera clear OROPHARYNX:no exudate, no erythema and lips, buccal mucosa, and tongue normal  NECK: supple, thyroid normal size, non-tender, without nodularity LYMPH:  no palpable lymphadenopathy in the cervical, axillary or inguinal LUNGS: clear to auscultation and percussion with normal breathing effort HEART: regular rate & rhythm and no murmurs and no lower extremity edema ABDOMEN:abdomen soft, non-tender and normal bowel sounds Musculoskeletal:no cyanosis of digits and no clubbing  PSYCH: alert & oriented x 3 with fluent speech NEURO: no focal motor/sensory deficits  LABORATORY DATA:  I have reviewed the data as listed Lab Results  Component Value Date   WBC 7.5 07/18/2015   HGB  14.5 07/18/2015   HCT 45.6 07/18/2015   MCV 95.2 07/18/2015   PLT 145* 07/18/2015   Lab Results  Component Value Date   NA 138 07/18/2015   K 4.8 07/18/2015   CL 104 07/18/2015   CO2 27 07/18/2015    RADIOGRAPHIC STUDIES: I have personally reviewed the radiological reports and agreed with the findings in the report.  ASSESSMENT AND PLAN:  Atypical ductal hyperplasia of left breast Left breast intraductal papilloma with atypical ductal hyperplasia diagnosed 07/15/2015 status post lumpectomy 07/23/2015  Pathology review: I discussed with the patient extensively the difference between atypical ductal hyperplasia and other noninvasive cancer site DCIS. We also explained the difference. Papilloma and atypical ductal hyperplasia.  Prognosis: Based upon American Cancer Society breast cancer risk assessment tool, her five-year risk of breast cancer that 4.6%, and average woman's risk would be 1.9%: Her lifetime risk of  breast cancer would be 25%, average risk would be 9.6%  Based on NSABP P1 clinical trial, tamoxifen decreases the risk of breast cancer recurrence by half. So she took 5 years of tamoxifen, the risk of breast cancer in 5 years would be 2.3% and lifetime would be 12.5%. I discussed with her that there would not be any difference in survival.   Tamoxifen toxicities counseling:We discussed the risks and benefits of tamoxifen. These include but not limited to insomnia, hot flashes, mood changes, vaginal dryness, and weight gain. Although rare, serious side effects including risk of blood clots were also discussed.   If she decides to go on tamoxifen, we will make a follow-up appointment in 3 months. If she decides to go on surveillance, she will make appointments to follow with her primary care physician and her surgeon for surveillance and follow-up.  I also discussed the importance of losing some weight and exercising regularly to decrease risk of breast cancer.   All questions were answered. The patient knows to call the clinic with any problems, questions or concerns.    Rulon Eisenmenger, MD 1:42 PM

## 2015-08-19 ENCOUNTER — Encounter: Payer: Self-pay | Admitting: Obstetrics and Gynecology

## 2015-08-19 ENCOUNTER — Encounter: Payer: 59 | Admitting: Hematology and Oncology

## 2015-08-19 ENCOUNTER — Telehealth: Payer: Self-pay

## 2015-08-19 DIAGNOSIS — M25569 Pain in unspecified knee: Secondary | ICD-10-CM

## 2015-08-19 HISTORY — DX: Pain in unspecified knee: M25.569

## 2015-08-19 NOTE — Telephone Encounter (Signed)
Routing mychart message to [place] for review and advise.

## 2015-08-19 NOTE — Telephone Encounter (Signed)
Non-Urgent Medical Question  Message [phone]   From  Cindy Carey   To  [name], MD   Sent  08/19/2015 8:59 AM     I saw Dr Lindi Adie at the [facility] yesterday. He recommended Tamoxifen, and I would like to get your opinion of that med for me, given my history of early surgical menopause. I understand all about taking it for prevention of breast cancer. My concern is about the potential side effects that I understand to be like menopause, particularly since in my mind, I have already had menopause symptoms twice - in 1984 after hysterectomy (it was probably 6 months before I started estrogen replacement) and then again early 40's when I stopped estrogen replacement. I am particularly concerned about more weight gain, given that is a current battle for me. Would also taking something like Phentermine help, at least for a few months while I adjust to Tamoxifen? Are there any other things that would be specific to my hx, such as bone health concerns, etc? If you call me, I will do my best to answer my phone today, but I am unavailable between 330-430. Thank you for your input.  [name]      Responsible Party    Pool - [facility] Clinical Pool No one has taken responsibility for this message.     No actions have been taken on this message.

## 2015-08-19 NOTE — Telephone Encounter (Signed)
Telephone encounter created to discuss mychart message with Dr.Jertson.

## 2015-08-20 NOTE — Telephone Encounter (Signed)
Spoke with the patient, answered her questions about tamoxifen.

## 2015-08-21 ENCOUNTER — Telehealth: Payer: Self-pay | Admitting: Hematology and Oncology

## 2015-08-21 ENCOUNTER — Other Ambulatory Visit: Payer: Self-pay

## 2015-08-21 DIAGNOSIS — N6092 Unspecified benign mammary dysplasia of left breast: Secondary | ICD-10-CM

## 2015-08-21 MED ORDER — TAMOXIFEN CITRATE 20 MG PO TABS: 20.0000 mg | ORAL_TABLET | Freq: Every day | ORAL | Status: DC

## 2015-08-21 NOTE — Telephone Encounter (Signed)
Aware of 3 month follow up

## 2015-08-21 NOTE — Progress Notes (Signed)
LMOVM - Per Dr Lindi Adie, prescription for Tamoxifen called in to [facility] - receipt confirmed.  Follow up needed in 3 months.  Pt to call clinic if she has any questions.    Order entered.  pof entered.

## 2015-09-01 ENCOUNTER — Ambulatory Visit: Payer: 59 | Admitting: Dietician

## 2015-11-11 MED FILL — CYCLOBENZAPRINE 10 MG TAB: 10 | 30 days supply | Qty: 30 | Fill #3

## 2015-11-11 MED FILL — PROMETHAZINE 25 MG TABLET: 25 | 10 days supply | Qty: 30 | Fill #2

## 2015-11-12 ENCOUNTER — Other Ambulatory Visit: Payer: Self-pay | Admitting: Hematology and Oncology

## 2015-11-12 DIAGNOSIS — N6092 Unspecified benign mammary dysplasia of left breast: Secondary | ICD-10-CM

## 2015-11-13 MED ORDER — TAMOXIFEN CITRATE 20 MG PO TABS: 20.0000 mg | ORAL_TABLET | Freq: Every day | ORAL | Status: DC

## 2015-11-13 MED FILL — TRUE METRIX GLUCOSE TEST ST: 90 days supply | Qty: 200 | Fill #1

## 2015-11-14 MED FILL — TAMOXIFEN 20 MG TABLET: 20 | 90 days supply | Qty: 90 | Fill #0

## 2015-11-20 ENCOUNTER — Other Ambulatory Visit: Payer: Self-pay

## 2015-11-20 DIAGNOSIS — N6092 Unspecified benign mammary dysplasia of left breast: Secondary | ICD-10-CM

## 2015-11-20 NOTE — Assessment & Plan Note (Signed)
Left breast intraductal papilloma with atypical ductal hyperplasia diagnosed 07/15/2015 status post lumpectomy 07/23/2015  Prognosis: Based upon American Cancer Society breast cancer risk assessment tool, her five-year risk of breast cancer that 4.6%, and average woman's risk would be 1.9%: Her lifetime risk of breast cancer would be 25%, average risk would be 9.6%  Current treatment: risk lowering therapy with Tamoxifen 20 mg daily  Tamoxifen Toxicities:  RTC in 6 months

## 2015-11-21 ENCOUNTER — Encounter: Payer: Self-pay | Admitting: Hematology and Oncology

## 2015-11-21 ENCOUNTER — Ambulatory Visit (HOSPITAL_BASED_OUTPATIENT_CLINIC_OR_DEPARTMENT_OTHER): Payer: 59 | Admitting: Hematology and Oncology

## 2015-11-21 ENCOUNTER — Telehealth: Payer: Self-pay | Admitting: Hematology and Oncology

## 2015-11-21 ENCOUNTER — Other Ambulatory Visit (HOSPITAL_BASED_OUTPATIENT_CLINIC_OR_DEPARTMENT_OTHER): Payer: 59

## 2015-11-21 VITALS — BP 136/75 | HR 83 | Temp 97.4°F | Resp 18 | Ht 63.0 in | Wt 265.7 lb

## 2015-11-21 DIAGNOSIS — N6092 Unspecified benign mammary dysplasia of left breast: Secondary | ICD-10-CM | POA: Diagnosis not present

## 2015-11-21 LAB — CBC WITH DIFFERENTIAL/PLATELET
BASO%: 0.2 % (ref 0.0–2.0)
BASOS ABS: 0 10*3/uL (ref 0.0–0.1)
EOS ABS: 0.1 10*3/uL (ref 0.0–0.5)
EOS%: 1.9 % (ref 0.0–7.0)
HEMATOCRIT: 46.3 % (ref 34.8–46.6)
HEMOGLOBIN: 15.1 g/dL (ref 11.6–15.9)
LYMPH#: 2.6 10*3/uL (ref 0.9–3.3)
LYMPH%: 41.9 % (ref 14.0–49.7)
MCH: 31 pg (ref 25.1–34.0)
MCHC: 32.6 g/dL (ref 31.5–36.0)
MCV: 95.1 fL (ref 79.5–101.0)
MONO#: 0.7 10*3/uL (ref 0.1–0.9)
MONO%: 11.3 % (ref 0.0–14.0)
NEUT#: 2.8 10*3/uL (ref 1.5–6.5)
NEUT%: 44.7 % (ref 38.4–76.8)
Platelets: 126 10*3/uL — ABNORMAL LOW (ref 145–400)
RBC: 4.87 10*6/uL (ref 3.70–5.45)
RDW: 15.1 % — ABNORMAL HIGH (ref 11.2–14.5)
WBC: 6.3 10*3/uL (ref 3.9–10.3)
nRBC: 0 % (ref 0–0)

## 2015-11-21 LAB — COMPREHENSIVE METABOLIC PANEL
ALBUMIN: 3.8 g/dL (ref 3.5–5.0)
ALT: 24 U/L (ref 0–55)
AST: 24 U/L (ref 5–34)
Alkaline Phosphatase: 75 U/L (ref 40–150)
Anion Gap: 11 mEq/L (ref 3–11)
BUN: 12.5 mg/dL (ref 7.0–26.0)
CALCIUM: 9.6 mg/dL (ref 8.4–10.4)
CHLORIDE: 105 meq/L (ref 98–109)
CO2: 24 mEq/L (ref 22–29)
CREATININE: 1 mg/dL (ref 0.6–1.1)
EGFR: 61 mL/min/{1.73_m2} — ABNORMAL LOW (ref >90)
GLUCOSE: 125 mg/dL (ref 70–140)
POTASSIUM: 4.7 meq/L (ref 3.5–5.1)
Sodium: 139 mEq/L (ref 136–145)
Total Bilirubin: 0.58 mg/dL (ref 0.20–1.20)
Total Protein: 7.2 g/dL (ref 6.4–8.3)

## 2015-11-21 NOTE — Telephone Encounter (Signed)
Appointment made and avs printed °

## 2015-11-21 NOTE — Progress Notes (Signed)
Patient Care Team: Rita Ohara, MD as PCP - General (Family Medicine) Bryson Ha Himmelrich, RD as Dietitian ([place]) Joretta Bachelor, [facility] as [place] Management (Pharmacist)  DIAGNOSIS: atypical ductal hyperplasia status post lumpectomy 07/23/2015  CHIEF COMPLIANT: follow-up on tamoxifen therapy  INTERVAL HISTORY: Cindy Carey is a 60 year old with above-mentioned history of atypical ductal hyperplasia who was started on risk lowering therapy with tamoxifen. She is here for a follow-up and toxicity check. She complains of fogginess in the head and occasional word finding difficulty. She has also noticed a complete lack of interest in activities and planning on doing any stuff at home. These symptoms have slowly improved over time.  REVIEW OF SYSTEMS:   Constitutional: Denies fevers, chills or abnormal weight loss Eyes: Denies blurriness of vision Ears, nose, mouth, throat, and face: Denies mucositis or sore throat Respiratory: Denies cough, dyspnea or wheezes Cardiovascular: Denies palpitation, chest discomfort Gastrointestinal:  Denies nausea, heartburn or change in bowel habits Skin: Denies abnormal skin rashes Lymphatics: Denies new lymphadenopathy or easy bruising Neurological:Denies numbness, tingling or new weaknesses Behavioral/Psych: fatigue Extremities: No lower extremity edema Breast:  denies any pain or lumps or nodules in either breasts All other systems were reviewed with the patient and are negative.  I have reviewed the past medical history, past surgical history, social history and family history with the patient and they are unchanged from previous note.  ALLERGIES:  is allergic to bee venom; shellfish allergy; and penicillins.  MEDICATIONS:  Current Outpatient Prescriptions  Medication Sig Dispense Refill  . albuterol (PROVENTIL HFA;VENTOLIN HFA) 108 (90 BASE) MCG/ACT inhaler Inhale 2 puffs into the lungs every 6 (six) hours as needed  for wheezing.    . Canagliflozin (INVOKANA) 300 MG TABS Take 1 tablet by mouth every morning.    . Cholecalciferol (VITAMIN D3) 2000 UNITS TABS Take 2,000 Units by mouth daily.    . cyclobenzaprine (FLEXERIL) 10 MG tablet Take 10 mg by mouth daily as needed for muscle spasms.    [name] EPINEPHrine (EPI-PEN) 0.3 mg/0.3 mL SOAJ injection Inject 0.3 mg into the muscle once as needed.    [name] lisinopril (PRINIVIL,ZESTRIL) 10 MG tablet Take 10 mg by mouth daily.    . Magnesium 250 MG TABS Take 250 mg by mouth daily.    . metFORMIN (GLUCOPHAGE-XR) 500 MG 24 hr tablet Take 1,000 mg by mouth 2 (two) times daily before a meal.    . Multiple Vitamins-Minerals (MULTIVITAMIN PO) Take 1 tablet by mouth daily.    . promethazine (PHENERGAN) 25 MG tablet Take 25 mg by mouth every 8 (eight) hours as needed for nausea.    . simvastatin (ZOCOR) 40 MG tablet Take 40 mg by mouth every evening.    . tamoxifen (NOLVADEX) 20 MG tablet Take 1 tablet (20 mg total) by mouth daily. 90 tablet 0  . TRUE METRIX BLOOD GLUCOSE TEST test strip   9  . valACYclovir (VALTREX) 1000 MG tablet Take 2,000 mg by mouth 2 (two) times daily as needed.     No current facility-administered medications for this visit.    PHYSICAL EXAMINATION: ECOG PERFORMANCE STATUS: 1 - Symptomatic but completely ambulatory  Filed Vitals:   11/21/15 0852  BP: 136/75  Pulse: 83  Temp: 97.4 F (36.3 C)  Resp: 18   Filed Weights   11/21/15 0852  Weight: 265 lb 11.2 oz (120.521 kg)    GENERAL:alert, no distress and comfortable SKIN: skin color, texture, turgor are normal, no rashes  or significant lesions EYES: normal, Conjunctiva are pink and non-injected, sclera clear OROPHARYNX:no exudate, no erythema and lips, buccal mucosa, and tongue normal  NECK: supple, thyroid normal size, non-tender, without nodularity LYMPH:  no palpable lymphadenopathy in the cervical, axillary or inguinal LUNGS: clear to auscultation and percussion with normal breathing  effort HEART: regular rate & rhythm and no murmurs and no lower extremity edema ABDOMEN:abdomen soft, non-tender and normal bowel sounds MUSCULOSKELETAL:no cyanosis of digits and no clubbing  NEURO: alert & oriented x 3 with fluent speech, no focal motor/sensory deficits EXTREMITIES: No lower extremity edema  LABORATORY DATA:  I have reviewed the data as listed   Chemistry      Component Value Date/Time   NA 138 07/18/2015 0745   K 4.8 07/18/2015 0745   CL 104 07/18/2015 0745   CO2 27 07/18/2015 0745   BUN 12 07/18/2015 0745   CREATININE 0.83 07/18/2015 0745      Component Value Date/Time   CALCIUM 9.7 07/18/2015 0745     Lab Results  Component Value Date   WBC 6.3 11/21/2015   HGB 15.1 11/21/2015   HCT 46.3 11/21/2015   MCV 95.1 11/21/2015   PLT 126* 11/21/2015   NEUTROABS 2.8 11/21/2015   ASSESSMENT & PLAN:  Atypical ductal hyperplasia of left breast Left breast intraductal papilloma with atypical ductal hyperplasia diagnosed 07/15/2015 status post lumpectomy 07/23/2015  Prognosis: Based upon American Cancer Society breast cancer risk assessment tool, her five-year risk of breast cancer that 4.6%, and average woman's risk would be 1.9%: Her lifetime risk of breast cancer would be 25%, average risk would be 9.6%  Current treatment: risk lowering therapy with Tamoxifen 20 mg daily  Tamoxifen Toxicities: 1. Fogginess in the head 2. Lack of interest in planning on doing any activity  These symptoms have slowly improved over time. She plans to tough it out and take the 20 mg tablet daily. However, I discussed with her that if the symptoms continue, she could decrease the dosage of tamoxifen to half a tablet daily.  RTC in 6 months  No orders of the defined types were placed in this encounter.   The patient has a good understanding of the overall plan. she agrees with it. she will call with any problems that may develop before the next visit here.   Rulon Eisenmenger,  MD 11/21/2015

## 2015-11-28 ENCOUNTER — Ambulatory Visit: Payer: 59 | Admitting: Pharmacist

## 2015-12-01 MED FILL — PROMETHAZINE 25 MG TABLET: 25 | 10 days supply | Qty: 30 | Fill #3

## 2015-12-01 MED FILL — METFORMIN HCL ER 500 MG TAB: 500 | 90 days supply | Qty: 180 | Fill #1

## 2015-12-01 MED FILL — INVOKANA 300 MG TABLET: 300 | 90 days supply | Qty: 90 | Fill #1

## 2015-12-12 ENCOUNTER — Ambulatory Visit (INDEPENDENT_AMBULATORY_CARE_PROVIDER_SITE_OTHER): Payer: Self-pay | Admitting: Family Medicine

## 2015-12-12 ENCOUNTER — Encounter: Payer: Self-pay | Admitting: Pharmacist

## 2015-12-12 VITALS — BP 124/80 | Wt 265.8 lb

## 2015-12-12 DIAGNOSIS — E119 Type 2 diabetes mellitus without complications: Secondary | ICD-10-CM

## 2015-12-12 NOTE — Progress Notes (Signed)
Subjective:  Patient presents today for 3 month diabetes follow-up as part of the employer-sponsored Link to Wellness program.  Current diabetes regimen includes metformin 1000 mg ER BID & Invokana 300 mg once daily.  Patient also continues on daily ACE Inhibitor and statin.  Most recent MD follow-up was in November with Dr. Philip Aspen. A1C at that time was 6.1%.  Patient has a pending appt for March 2017. No med changes or major health changes at this time.   Patient reports that she has been under increased stress lately. Her mother in law is currently hospitalized for a-fib and CHF at a hospital in [place]. She has been travelling to [place] for the past several weekends.   Assessment:  Diabetes: Most recent A1C was 6.1% which is at goal of less than 7%. Weight is stable from last visit with me.    CBG Review: She states that she has been using the True Metrix meter and she states that her readings seemed high to her. She started testing with both True Metrix and a One Touch meter. The True Metrix meter is running at least 20 points higher than the One Touch, sometimes up to 40 points. I gave patient a second True Metrix meter in case the first one was defective. Patient agreed to test with this prior to her upcoming appointment with PCP. She will contact me to let me know if this new meter is closer in results to the One Touch. I offered to her to change supplies to [employer]. She wants to try the new meter first.   When she is at home she is checking at least once daily, sometimes twice daily. Usually this is fasting.   She did not bring her meter with her to the appointment. She states that on average her CBG is running in the 100s-140s (on the True Metrix meter).   She denies hypoglycemia.    Lifestyle improvements:  Physical Activity-  She is going to [employer] a few times a week. She is using the exercise bike, some weight machines. She is spending on average 40 minutes  exercising.   Nutrition-  B- cereal, greek yogurt with granola as a snack in the morning after she is at work.  L- sandwiches or salads when the drug reps come.  D- uses the griddler to cook meats. Usually will do chicken on the grill. Sometimes also eating sandwich and a salad. Sometimes scrambled eggs.   Since she has been travelling on the weekends to visit family she hasn't had the time to cook and prepare food on the weekends (this is what she was doing previously).   She has also been eating out more while she is in [place] with her mother in law since she has been hospitalized. She states that she is trying to find grilled options- maybe grilled chicken sandwich.     Follow up with me in 4 months. Patient is switching PCPs and will be seeing Dr. Tomi Bamberger in June. I scheduled to see patient after she establishes care with new PCP.    Plan/Goals for Next Visit:  1. Physical Activity- keep up with at least three days a week for physical activity (bike, weight training, walking, etc.). When you are out of town for the weekend, try to look for ways that you can be active and walk.  2. While you continue to travel on the weekends, look for healthy options while you are eating out. Once your weekends open up with  more time, get back into meal planning and cooking on the weekends for eating the week.     Next appointment to see me is: Friday June 23rd at 4:30 PM.    Jinny Blossom D. Donneta Romberg, PharmD, BCPS, CDE [place] to [place] Coordinator [phone]

## 2015-12-28 ENCOUNTER — Encounter: Payer: Self-pay | Admitting: Family Medicine

## 2015-12-28 NOTE — Progress Notes (Signed)
I have reviewed this pharmacist's note and agree  

## 2016-01-08 MED FILL — LISINOPRIL 10 MG TABLET: 10 | 90 days supply | Qty: 90 | Fill #0

## 2016-01-08 MED FILL — PROMETHAZINE 25 MG TABLET: 25 | 10 days supply | Qty: 30 | Fill #4

## 2016-01-08 MED FILL — CYCLOBENZAPRINE 10 MG TAB: 10 | 30 days supply | Qty: 30 | Fill #4

## 2016-01-08 MED FILL — SIMVASTATIN 40 MG TABLET: 40 | 90 days supply | Qty: 90 | Fill #3

## 2016-01-09 DIAGNOSIS — L918 Other hypertrophic disorders of the skin: Secondary | ICD-10-CM | POA: Diagnosis not present

## 2016-01-09 DIAGNOSIS — L710 Perioral dermatitis: Secondary | ICD-10-CM | POA: Diagnosis not present

## 2016-01-09 MED FILL — metroNIDAZOLE 0.75 % LOTN: 0.75 | 25 days supply | Qty: 59 | Fill #0

## 2016-01-20 MED FILL — EPINEPHRINE 0.3 MG AUTO-INJ: 0.3 | 30 days supply | Qty: 2 | Fill #0

## 2016-02-11 MED FILL — CYCLOBENZAPRINE 10 MG TAB: 10 | 30 days supply | Qty: 30 | Fill #5

## 2016-02-11 MED FILL — metroNIDAZOLE 0.75 % LOTN: 0.75 | 25 days supply | Qty: 59 | Fill #1

## 2016-02-12 ENCOUNTER — Other Ambulatory Visit: Payer: Self-pay | Admitting: Hematology and Oncology

## 2016-02-16 ENCOUNTER — Other Ambulatory Visit: Payer: Self-pay | Admitting: Hematology and Oncology

## 2016-02-16 DIAGNOSIS — N6092 Unspecified benign mammary dysplasia of left breast: Secondary | ICD-10-CM

## 2016-02-16 MED ORDER — TAMOXIFEN CITRATE 20 MG PO TABS: 20.0000 mg | ORAL_TABLET | Freq: Every day | ORAL | Status: DC

## 2016-02-16 MED FILL — TAMOXIFEN 20 MG TABLET: 20 | 90 days supply | Qty: 90 | Fill #0

## 2016-02-23 MED FILL — METFORMIN HCL ER 500 MG TAB: 500 | 90 days supply | Qty: 180 | Fill #2

## 2016-02-23 MED FILL — INVOKANA 300 MG TABLET: 300 | 90 days supply | Qty: 90 | Fill #2

## 2016-03-24 MED FILL — CYCLOBENZAPRINE 10 MG TAB: 10 | 30 days supply | Qty: 30 | Fill #6

## 2016-03-24 MED FILL — metroNIDAZOLE 0.75 % LOTN: 0.75 | 25 days supply | Qty: 59 | Fill #2

## 2016-03-31 ENCOUNTER — Encounter: Payer: Self-pay | Admitting: Family Medicine

## 2016-03-31 ENCOUNTER — Ambulatory Visit (INDEPENDENT_AMBULATORY_CARE_PROVIDER_SITE_OTHER): Payer: 59 | Admitting: Family Medicine

## 2016-03-31 VITALS — BP 128/78 | HR 76 | Ht 63.75 in | Wt 272.8 lb

## 2016-03-31 DIAGNOSIS — Z23 Encounter for immunization: Secondary | ICD-10-CM

## 2016-03-31 DIAGNOSIS — Z1159 Encounter for screening for other viral diseases: Secondary | ICD-10-CM

## 2016-03-31 DIAGNOSIS — Z5181 Encounter for therapeutic drug level monitoring: Secondary | ICD-10-CM

## 2016-03-31 DIAGNOSIS — E119 Type 2 diabetes mellitus without complications: Secondary | ICD-10-CM

## 2016-03-31 DIAGNOSIS — E559 Vitamin D deficiency, unspecified: Secondary | ICD-10-CM | POA: Diagnosis not present

## 2016-03-31 DIAGNOSIS — I10 Essential (primary) hypertension: Secondary | ICD-10-CM

## 2016-03-31 DIAGNOSIS — Z0000 Encounter for general adult medical examination without abnormal findings: Secondary | ICD-10-CM

## 2016-03-31 DIAGNOSIS — E785 Hyperlipidemia, unspecified: Secondary | ICD-10-CM | POA: Diagnosis not present

## 2016-03-31 LAB — CBC WITH DIFFERENTIAL/PLATELET
BASOS ABS: 0 {cells}/uL (ref 0–200)
Basophils Relative: 0 %
Eosinophils Absolute: 148 cells/uL (ref 15–500)
Eosinophils Relative: 2 %
HEMATOCRIT: 46.7 % — AB (ref 35.0–45.0)
HEMOGLOBIN: 15.3 g/dL (ref 11.7–15.5)
LYMPHS ABS: 2738 {cells}/uL (ref 850–3900)
Lymphocytes Relative: 37 %
MCH: 30.5 pg (ref 27.0–33.0)
MCHC: 32.8 g/dL (ref 32.0–36.0)
MCV: 93 fL (ref 80.0–100.0)
MONO ABS: 666 {cells}/uL (ref 200–950)
MPV: 12.6 fL — ABNORMAL HIGH (ref 7.5–12.5)
Monocytes Relative: 9 %
NEUTROS PCT: 52 %
Neutro Abs: 3848 cells/uL (ref 1500–7800)
Platelets: 135 10*3/uL — ABNORMAL LOW (ref 140–400)
RBC: 5.02 MIL/uL (ref 3.80–5.10)
RDW: 14.5 % (ref 11.0–15.0)
WBC: 7.4 10*3/uL (ref 4.0–10.5)

## 2016-03-31 LAB — POCT URINALYSIS DIPSTICK
Bilirubin, UA: NEGATIVE
Blood, UA: NEGATIVE
LEUKOCYTES UA: NEGATIVE
Nitrite, UA: NEGATIVE
PH UA: 6
PROTEIN UA: NEGATIVE
SPEC GRAV UA: 1.025
UROBILINOGEN UA: NEGATIVE

## 2016-03-31 LAB — POCT GLYCOSYLATED HEMOGLOBIN (HGB A1C): HEMOGLOBIN A1C: 6.7

## 2016-03-31 LAB — TSH: TSH: 1.79 m[IU]/L

## 2016-03-31 NOTE — Patient Instructions (Signed)

## 2016-03-31 NOTE — Progress Notes (Signed)
Chief Complaint  Patient presents with  . Annual Exam    new patient annual exam no pap, sees Dr. Talbert Nan and is UTD. Did not want to do eye exam, she sees Dr. Claudean Kinds. UA showed trace ketones and 3+ glucose.    Cindy Carey is a 60 y.o. female who presents for a complete physical, to establish care, and follow up on her chronic conditions.  Diabetes follow-up:  Blood sugars at home are running 100-110 in the mornings, 140 in the evenings after dinner (80-90's before dinner sometimes, when "starving"). Her last A1c was 6.3% in November.  Denies hypoglycemia.  Denies polydipsia and polyuria.  Last eye exam was 09/2015.  Patient follows a low sugar diet and checks feet regularly without concerns. She was out of medications completely x 2 months (August to end of September 2016, when in [place] with her mother's illness)--she felt the best when off meds, and didn't feel as hungry.  "I felt like a normal person". She wonders if some of her medications make her feel hungry, if metformin is still needed, since her last A1c was good (November) despite being off the medications for that time.  Hypertension follow-up:  Blood pressures elsewhere are 120/70's.  Denies dizziness, headaches (just migraines), chest pain.  Denies side effects of medications.  Migraines--about 3x/year, has aura (green fuzzy vision change). Uses ibuprofen or aleve with good results, sometimes requires phenergan also.  Hyperlipidemia follow-up:  Patient is reportedly following a low-fat, low cholesterol diet.  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 Due to lifetime risk of breast cancer of 25%, she is on risk lowering therapy with Tamoxifen.  She saw Dr. Lindi Adie last in 11/2015 (onc). She had some side effects  initially including fogginess in head, and lack of interest in planning on doing activities (unsure if related to medicine, or stress of mother being sick in ICU, travel to [place]); no further side effects.  She saw Dr. Oval Linsey (cardiology) in 07/2015 for pre-op eval after abnormal EKG was noted--LAFB and incomplete RBBB. These findings weren't new, pt asymptomatic, and no further evaluation was performed.  Obesity:  Review of chart shows that weight has fluctuated up and from between low of 239 and high of 276 over the last 3-4 years.  Whe was interested in bariatric surgery back in 2013.  Some trouble with scheduling the many required visits, and her PCP Dr. Philip Aspen wasn't really recommending it highly, so she didn't continue to pursue this.  She saw PT for her knee pain, which has been helpful. She continues to use the resistance bands.  She enjoys hiking, but knee pain has limited her some. She took phentermine once in the past. She journals her foods, which helps her eat better overall.  Immunization History  Administered Date(s) Administered  . Influenza-Unspecified 06/27/2015  . Tdap 07/03/2015   Last Pap smear: UTD, 06/2015, per GYN Last mammogram: 06/2015  Last colonoscopy: 12/17/07 Dr. Collene Mares Last DEXA: 2013 (bariatric pre-op)--no results in system (sent to Dr. Hassell Done) Dentist: twice yearly Ophtho: yearly, Dr. Claudean Kinds Exercise: [employer] 3-4x/week (30 mins cardio, 20 mins weights), walks/hikes at least an hour on the weekends (in [place])  Past Medical History  Diagnosis Date  . Diabetes mellitus without complication ([place]) [id number]    T2DM  . Hyperlipidemia   . Hypertension   .  Morbid obesity ([place]) 09/04/12    BMI 49.4 kg/m^2   . History of endometriosis   . Blood transfusion without reported diagnosis 1983  . Dyspareunia   . Migraine with typical aura   . Injury of tendon of rotator cuff 2015    gym injury (right)  . H/O colonoscopy 2009  . H/O bone density study 2013  .  Knee pain 08/2015    L>R  . Exercise-induced asthma     worse in winter  . H/O cold sores     Past Surgical History  Procedure Laterality Date  . Breath tek h pylori  08/28/2012    Procedure: BREATH TEK H PYLORI;  Surgeon: Pedro Earls, MD;  Location: [facility];  Service: General;  Laterality: N/A;  . Cholecystectomy  2007  . Breast lumpectomy  2010  . Breast biopsy  2010  . Ganglion cyst excision Left 1999    Foot  . Total abdominal hysterectomy w/ bilateral salpingoophorectomy  1984    endometriosis (and appendectomy)  . Exploratory laparotomy  1983    endometriosis  . Breast lumpectomy with radioactive seed localization Left 07/23/2015    Procedure: LEFT BREAST SEED GUIDED EXCISION;  Surgeon: Rolm Bookbinder, MD;  Location: [facility];  Service: General;  Laterality: Left;    Social History   Social History  . Marital Status: Married    Spouse Name: N/A  . Number of Children: N/A  . Years of Education: N/A   Occupational History  . Not on file.   Social History Main Topics  . Smoking status: Never Smoker   . Smokeless tobacco: Never Used  . Alcohol Use: No  . Drug Use: No  . Sexual Activity: No   Other Topics Concern  . Not on file   Social History Narrative   Married, no children. 1 dog (mini daschund)   Epworth Sleepiness Scale = 1 (as of 08/13/15)    Family History  Problem Relation Age of Onset  . Breast cancer Paternal Aunt     20's  . Diabetes Father   . Heart attack Father 27  . Heart disease Father   . Hypertension Father   . Diabetes Paternal Grandfather   . Heart disease Paternal Grandfather   . Osteoporosis Maternal Grandmother   . Heart failure Maternal Grandmother   . Thyroid disease Mother     hypothyroidism  . Heart disease Mother 62    CABG at 35  . Hypertension Mother   . Pneumonia Sister   . Cancer Maternal Grandfather     lung  . Dementia Paternal Grandmother   . Cancer Paternal Grandmother      melanoma on her foot (not related to death)  . Cancer Maternal Aunt     lung  . Cancer Maternal Uncle     lung    Outpatient Encounter Prescriptions as of 03/31/2016  Medication Sig Note  . Canagliflozin (INVOKANA) 300 MG TABS Take 1 tablet by mouth every morning.   . Cholecalciferol (VITAMIN D3) 2000 UNITS TABS Take 2,000 Units by mouth daily.   . cyclobenzaprine (FLEXERIL) 10 MG tablet Take 10 mg by mouth daily as needed for muscle spasms. 03/31/2016: 1/2 tablet a few times/week for R shoulder pain and leg cramps  . lisinopril (PRINIVIL,ZESTRIL) 10 MG tablet Take 10 mg by mouth daily.   . Magnesium 250 MG TABS Take 250 mg by mouth daily. 03/31/2016: Takes for migraine prevention  . metFORMIN (GLUCOPHAGE-XR) 500 MG 24 hr  tablet Take 1,000 mg by mouth 2 (two) times daily before a meal.   . METRONIDAZOLE, TOPICAL, 0.75 % LOTN Apply 1 application topically 2 (two) times daily.  03/31/2016: For rosacea from Dr. Jarome Matin  . simvastatin (ZOCOR) 40 MG tablet Take 40 mg by mouth every evening.   . tamoxifen (NOLVADEX) 20 MG tablet Take 1 tablet (20 mg total) by mouth daily.   Suzan Nailer METRIX BLOOD GLUCOSE TEST test strip  11/21/2015: Received from: External Pharmacy  . albuterol (PROVENTIL HFA;VENTOLIN HFA) 108 (90 BASE) MCG/ACT inhaler Inhale 2 puffs into the lungs every 6 (six) hours as needed for wheezing. Reported on 03/31/2016   . EPINEPHrine (EPI-PEN) 0.3 mg/0.3 mL SOAJ injection Inject 0.3 mg into the muscle once as needed. Reported on 03/31/2016   . Multiple Vitamins-Minerals (MULTIVITAMIN PO) Take 1 tablet by mouth daily. Reported on 03/31/2016   . promethazine (PHENERGAN) 25 MG tablet Take 25 mg by mouth every 8 (eight) hours as needed for nausea. Reported on 03/31/2016   . valACYclovir (VALTREX) 1000 MG tablet Take 2,000 mg by mouth 2 (two) times daily as needed. Reported on 03/31/2016 03/31/2016: Uses prn cold sores   No facility-administered encounter medications on file as of 03/31/2016.     Allergies  Allergen Reactions  . Bee Venom Anaphylaxis  . Shellfish Allergy Anaphylaxis  . Penicillins Hives    ROS:  The patient denies anorexia, fever, significant weight changes, vision changes, decreased hearing, ear pain, sore throat, breast concerns, chest pain, palpitations, dizziness, syncope, dyspnea on exertion, cough, swelling, nausea, vomiting, diarrhea, constipation, abdominal pain, melena, hematochezia, indigestion/heartburn, hematuria, incontinence, dysuria, vaginal bleeding, discharge, odor or itch, genital lesions, numbness, tingling, weakness, tremor, suspicious skin lesions, depression, anxiety, abnormal bleeding/bruising, or enlarged lymph nodes. Some stress urinary incontinence (with exercise) L>R knee pain, right shoulder pain (muscular), and sometimes has right hip pain with certain positions (scooting into a booth). Rosacea is controlled, worse in the summer and certain foods. Migraines as per HPI.    PHYSICAL EXAM:  BP 128/78 mmHg  Pulse 76  Ht 5' 3.75" (1.619 m)  Wt 272 lb 12.8 oz (123.741 kg)  BMI 47.21 kg/m2  LMP 10/18/1982  General Appearance:    Alert, cooperative, no distress, appears stated age, obese  Head:    Normocephalic, without obvious abnormality, atraumatic  Eyes:    PERRL, conjunctiva/corneas clear, EOM's intact, fundi    benign  Ears:    Normal TM's and external ear canals  Nose:   Nares normal, mucosa normal, no drainage or sinus   tenderness  Throat:   Lips, mucosa, and tongue normal; teeth and gums normal  Neck:   Supple, no lymphadenopathy;  thyroid:  no   enlargement/tenderness/nodules; no carotid   bruit or JVD  Back:    Spine nontender, no curvature, ROM normal, no CVA     tenderness  Lungs:     Clear to auscultation bilaterally without wheezes, rales or     ronchi; respirations unlabored  Chest Wall:    No tenderness or deformity   Heart:    Regular rate and rhythm, S1 and S2 normal, no murmur, rub   or gallop  Breast Exam:     Deferred to GYN  Abdomen:     Soft, non-tender, nondistended, normoactive bowel sounds,    no masses, no hepatosplenomegaly  Genitalia:    Deferred to GYN     Extremities:   No clubbing, cyanosis or edema. Normal diabetic foot exam  Pulses:  2+ and symmetric all extremities  Skin:   Skin color, texture, turgor normal, no rashes or lesions  Lymph nodes:   Cervical, supraclavicular, and axillary nodes normal  Neurologic:   CNII-XII intact, normal strength, sensation and gait; reflexes 2+ and symmetric throughout          Psych:   Normal mood, affect, hygiene and grooming.      Lab Results  Component Value Date   HGBA1C 6.7 03/31/2016   Urine dip: trace ketones and 3+ glu (fasting, and on invokana, so okay)  ASSESSMENT/PLAN:  Annual physical exam - Plan: POCT Urinalysis Dipstick, Comprehensive metabolic panel, CBC with Differential/Platelet, Lipid panel, VITAMIN D 25 Hydroxy (Vit-D Deficiency, Fractures), TSH, Microalbumin / creatinine urine ratio, Hepatitis C antibody  Diabetes mellitus without complication (HCC) - controlled with invokana and metformin--continue. Exercise and weight loss encouraged - Plan: HgB A1c, Comprehensive metabolic panel, TSH, Microalbumin / creatinine urine ratio, Pneumococcal polysaccharide vaccine 23-valent greater than or equal to 2yo subcutaneous/IM  Hyperlipidemia - Plan: Comprehensive metabolic panel, Lipid panel  Essential hypertension - controlled - Plan: Comprehensive metabolic panel  Morbid obesity, unspecified obesity type ([place]) - discussed treatment options; consider medications (discussed wt loss meds, and injectables for DM, if rising A1c)  Need for hepatitis C screening test - Plan: Hepatitis C antibody  Vitamin D deficiency - Plan: VITAMIN D 25 Hydroxy (Vit-D Deficiency, Fractures)  Medication monitoring encounter - Plan: Comprehensive metabolic panel, CBC with Differential/Platelet, Lipid panel, VITAMIN D 25 Hydroxy (Vit-D Deficiency,  Fractures), Microalbumin / creatinine urine ratio  Immunization due - Plan: Pneumococcal polysaccharide vaccine 23-valent greater than or equal to 2yo subcutaneous/IM   Discussed monthly self breast exams and yearly mammograms; at least 30 minutes of aerobic activity at least 5 days/week, weight-bearing exercise; proper sunscreen use reviewed; healthy diet, including goals of calcium and vitamin D intake and alcohol recommendations (less than or equal to 1 drink/day) reviewed; regular seatbelt use; changing batteries in smoke detectors.  Immunization recommendations discussed--continue yearly flu shots; Tdap UTD. Needs pneumonia vaccine due to DM.  Colonoscopy recommendations reviewed, UTD  Pneumovax today--risks/side effects reviewed. prevnar age 70; pneumovax booster age 51.  F/u 6 months for med check sooner prn if desires change in medications or other concerns arise.

## 2016-04-01 LAB — LIPID PANEL
Cholesterol: 158 mg/dL (ref 125–200)
HDL: 69 mg/dL (ref >=46)
LDL CALC: 61 mg/dL (ref <130)
Total CHOL/HDL Ratio: 2.3 Ratio (ref <=5.0)
Triglycerides: 141 mg/dL (ref <150)
VLDL: 28 mg/dL (ref <30)

## 2016-04-01 LAB — COMPREHENSIVE METABOLIC PANEL
ALT: 22 U/L (ref 6–29)
AST: 31 U/L (ref 10–35)
Albumin: 4.1 g/dL (ref 3.6–5.1)
Alkaline Phosphatase: 74 U/L (ref 33–130)
BUN: 16 mg/dL (ref 7–25)
CHLORIDE: 106 mmol/L (ref 98–110)
CO2: 15 mmol/L — ABNORMAL LOW (ref 20–31)
Calcium: 9.5 mg/dL (ref 8.6–10.4)
Creat: 0.87 mg/dL (ref 0.50–1.05)
GLUCOSE: 113 mg/dL — AB (ref 65–99)
POTASSIUM: 4.8 mmol/L (ref 3.5–5.3)
Sodium: 140 mmol/L (ref 135–146)
Total Bilirubin: 0.5 mg/dL (ref 0.2–1.2)
Total Protein: 7.1 g/dL (ref 6.1–8.1)

## 2016-04-01 LAB — HEPATITIS C ANTIBODY: HCV Ab: NEGATIVE

## 2016-04-01 LAB — VITAMIN D 25 HYDROXY (VIT D DEFICIENCY, FRACTURES): Vit D, 25-Hydroxy: 41 ng/mL (ref 30–100)

## 2016-04-02 LAB — MICROALBUMIN / CREATININE URINE RATIO
CREATININE, URINE: 106 mg/dL (ref 20–320)
Microalb Creat Ratio: 3 mcg/mg creat (ref <30)
Microalb, Ur: 0.3 mg/dL

## 2016-04-05 MED FILL — LISINOPRIL 10 MG TABLET: 10 | 90 days supply | Qty: 90 | Fill #1

## 2016-04-05 MED FILL — SIMVASTATIN 40 MG TABLET: 40 | 90 days supply | Qty: 90 | Fill #4

## 2016-04-08 ENCOUNTER — Encounter: Payer: Self-pay | Admitting: Family Medicine

## 2016-04-08 NOTE — Telephone Encounter (Signed)
Please call in the phentermine (as pended) to her pharmacy.  Thanks.

## 2016-04-09 ENCOUNTER — Ambulatory Visit: Payer: Self-pay | Admitting: Pharmacist

## 2016-04-09 MED ORDER — PHENTERMINE HCL 37.5 MG PO CAPS: 37.5000 mg | ORAL_CAPSULE | ORAL | Status: DC

## 2016-04-09 MED FILL — PHENTERMINE 37.5 MG TABLET: 37.5 | 30 days supply | Qty: 30 | Fill #0

## 2016-04-09 NOTE — Telephone Encounter (Signed)
LMTCB

## 2016-04-09 NOTE — Telephone Encounter (Signed)
rx called in- pt notified by Dr. Tomi Bamberger via [name]. Cindy Carey

## 2016-04-27 ENCOUNTER — Encounter: Payer: Self-pay | Admitting: Hematology and Oncology

## 2016-04-30 ENCOUNTER — Encounter: Payer: Self-pay | Admitting: Pharmacist

## 2016-04-30 ENCOUNTER — Other Ambulatory Visit: Payer: Self-pay | Admitting: Pharmacist

## 2016-04-30 VITALS — BP 122/82 | Wt 266.0 lb

## 2016-04-30 DIAGNOSIS — E119 Type 2 diabetes mellitus without complications: Secondary | ICD-10-CM

## 2016-04-30 NOTE — Patient Outreach (Signed)
Subjective:  Patient presents today for 3 month diabetes follow-up as part of the employer-sponsored Link to Wellness program.  Current diabetes regimen includes Invokana 300 mg daily, metformin ER 1000 mg BID.   Patient also continues on daily ACE Inhibitor and statin.  Most recent MD follow-up was with Dr. Tomi Bamberger (New PCP) in June. A1C was 6.7% Patient has a pending appt for September.   No major health changes at this time. Phentermine was added at her last visit with Dr. Tomi Bamberger.     Assessment:  Diabetes: Most recent A1C was 6.7 % which is at goal of less than 7%. Weight is stable from last visit with me, but has decreased 6 pounds since her last appointment with PCP 4 weeks ago.   CBG Review: Patient states that she is checking CBG two times weekly fasting.  Sometimes she will take another reading after a meal. She did not bring meter with her to the appointment today.   Per patient, fasting CBG is running around 100-110. Highest- 147, Lowest- 88. Denies hypoglycemia.   Lifestyle improvements:  Physical Activity-  She is going to [employer] about three times a week. She usually spends 45 minutes exercising there. She usually uses the treadmill or the recumbent bike. Sometimes she is walking.   Nutrition-  She reports that she has been doing a lot more salads and sandwiches at night because it is too hot to cook.  For breakfast she is making egg omelette muffins (making ahead of time) and eating those for breakfast in the morning. Usually also a piece of fruit.  L- [place] sandwich and carrot sticks. If she is having a drug rep lunch she is trying to eat salads with grilled chicken.  D- salads and sandwiches  Overall diet has improved since she is not travelling as frequently anymore and is able to prepare more of her own food.   Follow up with me in 5-6 months when I am back from maternity leave.    Plan/Goals for Next Visit:  1. Continue physical activity three days a week  for 45 minutes. Try to get the orientation for the weight machines.  2. Continue making healthy eating choices and limit how often you eat out.     Next appointment to see me is: Friday January 5th at 8 AM.    Truett Mainland. Donneta Romberg, PharmD, BCPS, CDE [facility] [place] Coordinator [phone]

## 2016-05-04 MED FILL — KETOCONAZOLE 2% CREAM: 2 | 15 days supply | Qty: 30 | Fill #0

## 2016-05-10 MED FILL — PHENTERMINE 37.5 MG TABLET: 37.5 | 30 days supply | Qty: 30 | Fill #1

## 2016-05-10 MED FILL — TAMOXIFEN 20 MG TABLET: 20 | 90 days supply | Qty: 90 | Fill #1

## 2016-05-17 MED FILL — METFORMIN HCL ER 500 MG TAB: 500 | 90 days supply | Qty: 180 | Fill #3

## 2016-05-17 MED FILL — INVOKANA 300 MG TABLET: 300 | 90 days supply | Qty: 90 | Fill #3

## 2016-05-17 MED FILL — KETOCONAZOLE 2% CREAM: 2 | 15 days supply | Qty: 30 | Fill #1

## 2016-05-19 NOTE — Assessment & Plan Note (Signed)
Left breast intraductal papilloma with atypical ductal hyperplasia diagnosed 07/15/2015 status post lumpectomy 07/23/2015  Prognosis: Based upon American Cancer Society breast cancer risk assessment tool, her five-year risk of breast cancer that 4.6%, and average woman's risk would be 1.9%: Her lifetime risk of breast cancer would be 25%, average risk would be 9.6%  Current treatment: risk lowering therapy with Tamoxifen 20 mg daily  Tamoxifen Toxicities: 1. Fogginess in the head 2. Lack of interest in planning on doing any activity  These symptoms have slowly improved over time. She plans to tough it out and take the 20 mg tablet daily. However, I discussed with her that if the symptoms continue, she could decrease the dosage of tamoxifen to half a tablet daily.  RTC in 6 months

## 2016-05-20 ENCOUNTER — Encounter: Payer: Self-pay | Admitting: Hematology and Oncology

## 2016-05-20 ENCOUNTER — Telehealth: Payer: Self-pay | Admitting: Hematology and Oncology

## 2016-05-20 ENCOUNTER — Ambulatory Visit (HOSPITAL_BASED_OUTPATIENT_CLINIC_OR_DEPARTMENT_OTHER): Payer: 59 | Admitting: Hematology and Oncology

## 2016-05-20 DIAGNOSIS — N6092 Unspecified benign mammary dysplasia of left breast: Secondary | ICD-10-CM | POA: Diagnosis not present

## 2016-05-20 NOTE — Telephone Encounter (Signed)
per pof to sch pt appt-gave pt copy of avs °

## 2016-05-20 NOTE — Telephone Encounter (Signed)
cld pt and left message of time & date of appt for 01/27/17 @8 :45

## 2016-05-20 NOTE — Progress Notes (Signed)
Patient Care Team: Rita Ohara, MD as PCP - General (Family Medicine) Bryson Ha Himmelrich, RD (Inactive) as Dietitian (Bariatrics) Joretta Bachelor, [facility] as [facility] (Pharmacist)  DIAGNOSIS: atypical ductal hyperplasia status post lumpectomy 07/23/2015  CHIEF COMPLIANT: follow-up on tamoxifen therapy  INTERVAL HISTORY: Cindy Carey is a 60 year old with above-mentioned history of atypical ductal hyperplasia status post lumpectomy who is here on tamoxifen therapy and she appears to be tolerating it extremely well. Previously she had side effects of tamoxifen including fogginess in the head and lack of interest in doing any activity but all of these have improved and she no longer has any problems from it.  REVIEW OF SYSTEMS:   Constitutional: Denies fevers, chills or abnormal weight loss Eyes: Denies blurriness of vision Ears, nose, mouth, throat, and face: Denies mucositis or sore throat Respiratory: Denies cough, dyspnea or wheezes Cardiovascular: Denies palpitation, chest discomfort Gastrointestinal:  Denies nausea, heartburn or change in bowel habits Skin: Denies abnormal skin rashes Lymphatics: Denies new lymphadenopathy or easy bruising Neurological:Denies numbness, tingling or new weaknesses Behavioral/Psych: Mood is stable, no new changes  Extremities: No lower extremity edema Breast:  denies any pain or lumps or nodules in either breasts All other systems were reviewed with the patient and are negative.  I have reviewed the past medical history, past surgical history, social history and family history with the patient and they are unchanged from previous note.  ALLERGIES:  is allergic to bee venom; shellfish allergy; and penicillins.  MEDICATIONS:  Current Outpatient Prescriptions  Medication Sig Dispense Refill  . albuterol (PROVENTIL HFA;VENTOLIN HFA) 108 (90 BASE) MCG/ACT inhaler Inhale 2 puffs into the lungs every 6 (six) hours as  needed for wheezing. Reported on 03/31/2016    . Canagliflozin (INVOKANA) 300 MG TABS Take 1 tablet by mouth every morning.    . Cholecalciferol (VITAMIN D3) 2000 UNITS TABS Take 2,000 Units by mouth daily.    . cyclobenzaprine (FLEXERIL) 10 MG tablet Take 10 mg by mouth daily as needed for muscle spasms.    [name] EPINEPHrine (EPI-PEN) 0.3 mg/0.3 mL SOAJ injection Inject 0.3 mg into the muscle once as needed. Reported on 04/30/2016    . lisinopril (PRINIVIL,ZESTRIL) 10 MG tablet Take 10 mg by mouth daily.    . Magnesium 250 MG TABS Take 250 mg by mouth daily.    . metFORMIN (GLUCOPHAGE-XR) 500 MG 24 hr tablet Take 1,000 mg by mouth 2 (two) times daily before a meal.    . METRONIDAZOLE, TOPICAL, 0.75 % LOTN Apply 1 application topically 2 (two) times daily.   2  . Multiple Vitamins-Minerals (MULTIVITAMIN PO) Take 1 tablet by mouth daily. Reported on 03/31/2016    . phentermine 37.5 MG capsule Take 1 capsule (37.5 mg total) by mouth every morning. 30 capsule 2  . promethazine (PHENERGAN) 25 MG tablet Take 25 mg by mouth every 8 (eight) hours as needed for nausea. Reported on 04/30/2016    . simvastatin (ZOCOR) 40 MG tablet Take 40 mg by mouth every evening.    . tamoxifen (NOLVADEX) 20 MG tablet Take 1 tablet (20 mg total) by mouth daily. 90 tablet 3  . valACYclovir (VALTREX) 1000 MG tablet Take 2,000 mg by mouth 2 (two) times daily as needed. Reported on 03/31/2016     No current facility-administered medications for this visit.     PHYSICAL EXAMINATION: ECOG PERFORMANCE STATUS: 0 - Asymptomatic  Vitals:   05/20/16 0837  BP: 128/69  Pulse: 87  Resp: 19  Temp: 98.8 F (37.1 C)   Filed Weights   05/20/16 0837  Weight: 272 lb 9.6 oz (123.7 kg)    GENERAL:alert, no distress and comfortable SKIN: skin color, texture, turgor are normal, no rashes or significant lesions EYES: normal, Conjunctiva are pink and non-injected, sclera clear OROPHARYNX:no exudate, no erythema and lips, buccal mucosa,  and tongue normal  NECK: supple, thyroid normal size, non-tender, without nodularity LYMPH:  no palpable lymphadenopathy in the cervical, axillary or inguinal LUNGS: clear to auscultation and percussion with normal breathing effort HEART: regular rate & rhythm and no murmurs and no lower extremity edema ABDOMEN:abdomen soft, non-tender and normal bowel sounds MUSCULOSKELETAL:no cyanosis of digits and no clubbing  NEURO: alert & oriented x 3 with fluent speech, no focal motor/sensory deficits EXTREMITIES: No lower extremity edema  LABORATORY DATA:  I have reviewed the data as listed   Chemistry      Component Value Date/Time   NA 140 03/31/2016 0859   NA 139 11/21/2015 0826   K 4.8 03/31/2016 0859   K 4.7 11/21/2015 0826   CL 106 03/31/2016 0859   CO2 15 (L) 03/31/2016 0859   CO2 24 11/21/2015 0826   BUN 16 03/31/2016 0859   BUN 12.5 11/21/2015 0826   CREATININE 0.87 03/31/2016 0859   CREATININE 1.0 11/21/2015 0826      Component Value Date/Time   CALCIUM 9.5 03/31/2016 0859   CALCIUM 9.6 11/21/2015 0826   ALKPHOS 74 03/31/2016 0859   ALKPHOS 75 11/21/2015 0826   AST 31 03/31/2016 0859   AST 24 11/21/2015 0826   ALT 22 03/31/2016 0859   ALT 24 11/21/2015 0826   BILITOT 0.5 03/31/2016 0859   BILITOT 0.58 11/21/2015 0826       Lab Results  Component Value Date   WBC 7.4 03/31/2016   HGB 15.3 03/31/2016   HCT 46.7 (H) 03/31/2016   MCV 93.0 03/31/2016   PLT 135 (L) 03/31/2016   NEUTROABS 3,848 03/31/2016     ASSESSMENT & PLAN:  Atypical ductal hyperplasia of left breast Left breast intraductal papilloma with atypical ductal hyperplasia diagnosed 07/15/2015 status post lumpectomy 07/23/2015  Prognosis: Based upon American Cancer Society breast cancer risk assessment tool, her five-year risk of breast cancer that 4.6%, and average woman's risk would be 1.9%: Her lifetime risk of breast cancer would be 25%, average risk would be 9.6%  Current treatment: risk  lowering therapy with Tamoxifen 20 mg daily  Tamoxifen Toxicities:  Fogginess in the head and Lack of interest in planning on doing any activity have resolved  Breast Cancer Surveillance: 1. Breast exam every 6 months 2. Mammogram annually in October 2017    RTC in April 2018 for follow-up.   No orders of the defined types were placed in this encounter.  The patient has a good understanding of the overall plan. she agrees with it. she will call with any problems that may develop before the next visit here.   Rulon Eisenmenger, MD 05/20/16

## 2016-06-02 ENCOUNTER — Encounter: Payer: Self-pay | Admitting: Family Medicine

## 2016-06-02 MED ORDER — PROMETHAZINE HCL 25 MG PO TABS: 25.0000 mg | ORAL_TABLET | Freq: Three times a day (TID) | ORAL | 0 refills | Status: DC | PRN

## 2016-06-02 MED ORDER — CYCLOBENZAPRINE HCL 10 MG PO TABS: 10.0000 mg | ORAL_TABLET | Freq: Three times a day (TID) | ORAL | 0 refills | Status: DC | PRN

## 2016-06-02 MED FILL — KETOCONAZOLE 2% CREAM: 2 | 15 days supply | Qty: 30 | Fill #2

## 2016-06-03 MED FILL — PROMETHAZINE 25 MG TABLET: 25 | 10 days supply | Qty: 30 | Fill #0

## 2016-06-03 MED FILL — CYCLOBENZAPRINE 10 MG TAB: 10 | 10 days supply | Qty: 30 | Fill #0

## 2016-06-08 MED FILL — PHENTERMINE 37.5 MG TABLET: 37.5 | 30 days supply | Qty: 30 | Fill #2

## 2016-06-10 ENCOUNTER — Encounter: Payer: Self-pay | Admitting: Family Medicine

## 2016-06-10 ENCOUNTER — Other Ambulatory Visit: Payer: Self-pay | Admitting: *Deleted

## 2016-06-10 MED ORDER — TRUEPLUS LANCETS 33G MISC: 1.0000 | Freq: Two times a day (BID) | 3 refills | Status: DC

## 2016-06-10 MED ORDER — GLUCOSE BLOOD VI STRP: ORAL_STRIP | 3 refills | Status: DC

## 2016-06-10 MED FILL — TRUE METRIX GLUCOSE TEST ST: 50 days supply | Qty: 100 | Fill #0

## 2016-06-10 MED FILL — TRUEplus LANCETS 30G MISC: 50 days supply | Qty: 100 | Fill #0

## 2016-06-22 MED FILL — KETOCONAZOLE 2% CREAM: 2 | 15 days supply | Qty: 30 | Fill #3

## 2016-07-02 NOTE — Progress Notes (Signed)
Chief Complaint  Patient presents with  . Follow-up    on phentermine, not really effective. Started in July.     Patient presents for 3 month follow-up on phentermine for weight loss She has dry mouth if taking the full pill, and is ravenous when it wore off. She changed to taking it 1/2 tablet twice daily.  Tolerates it better, but not very effective. She has gained weight.  She previously took phentermine, recalls the tablet looking a little different.  This was in 2012; she recalls mild dry mouth, tolerable, and that the medication was pretty effective at that time.  She has also tolerated topamax in the past for migraines (took up to 150mg  at one point, migraines resolved, and has been off for a couple of years).  We had discussed Qsymia at her last visit--she knows this will not be covered by her insurance, questions about taking the meds separately.   She has also looked into pursuing surgical options.  She went to the [place] seminar, and got the paperwork for me to fill out.  She is interested in the gastric sleeve procedure Last 5 years of weights:  2012 284#/BMI 50.3 2013 282#/BMI 50.0 2015 250#/BMI 44.3 2016 264#BMI 46.8 2017 278#/BMI 49.2  Previously tried phentermine (2012, 15-20# wt loss reported, regained), Weight watchers x 3 (in '84, 87, 2016), some loss, always regained. She has seen nutritionist for her diabetes, and discussed 1500 cal diabetic diet.  She journals her food on a regular basis.  Migraines--had one while on vacation, 1/2 tablet of phenergan was helpful. Wasn't really a vacation--was time off, but many family illness/stressors/travel  Diabetes: Last A1c 6.7 in June.  PMH, [place] SH reviewed.  Current Outpatient Prescriptions on File Prior to Visit  Medication Sig Dispense Refill  . Canagliflozin (INVOKANA) 300 MG TABS Take 1 tablet by mouth every morning.    . Cholecalciferol (VITAMIN D3) 2000 UNITS TABS Take 2,000 Units by mouth daily.    [name] glucose blood  (TRUE METRIX BLOOD GLUCOSE TEST) test strip Test BID 100 each 3  . Magnesium 250 MG TABS Take 250 mg by mouth daily.    . metFORMIN (GLUCOPHAGE-XR) 500 MG 24 hr tablet Take 1,000 mg by mouth 2 (two) times daily before a meal.    . Multiple Vitamins-Minerals (MULTIVITAMIN PO) Take 1 tablet by mouth daily. Reported on 03/31/2016    . tamoxifen (NOLVADEX) 20 MG tablet Take 1 tablet (20 mg total) by mouth daily. 90 tablet 3  . TRUEPLUS LANCETS 33G MISC 1 each by Does not apply route 2 (two) times daily at 10 AM and 5 PM. 100 each 3  . albuterol (PROVENTIL HFA;VENTOLIN HFA) 108 (90 BASE) MCG/ACT inhaler Inhale 2 puffs into the lungs every 6 (six) hours as needed for wheezing. Reported on 03/31/2016    . cyclobenzaprine (FLEXERIL) 10 MG tablet Take 1 tablet (10 mg total) by mouth 3 (three) times daily as needed for muscle spasms. (Patient not taking: Reported on 07/05/2016) 30 tablet 0  . EPINEPHrine (EPI-PEN) 0.3 mg/0.3 mL SOAJ injection Inject 0.3 mg into the muscle once as needed. Reported on 04/30/2016    . promethazine (PHENERGAN) 25 MG tablet Take 1 tablet (25 mg total) by mouth every 8 (eight) hours as needed for nausea. (Patient not taking: Reported on 07/05/2016) 30 tablet 0  . valACYclovir (VALTREX) 1000 MG tablet Take 2,000 mg by mouth 2 (two) times daily as needed. Reported on 03/31/2016     No current facility-administered  medications on file prior to visit.    Taking phentermine 37.5 1/2 tablet BID prior to today's visit  Allergies  Allergen Reactions  . Bee Venom Anaphylaxis  . Shellfish Allergy Anaphylaxis  . Penicillins Hives    ROS: no fever, chills, dizziness, chest pain, palpitations, insomnia, GI or GU complaints. No URI symptoms, cough, shortness of breath, bleeding, bruising rash. See HPI.  PHYSICAL EXAM: BP 114/72 (BP Location: Left Arm, Patient Position: Sitting, Cuff Size: Normal)   Pulse 80   Ht 5\' 4"  (1.626 m)   Wt 275 lb 3.2 oz (124.8 kg)   LMP 10/18/1982   BMI 47.24  kg/m    Wt Readings from Last 3 Encounters:  07/05/16 275 lb 3.2 oz (124.8 kg)  05/20/16 272 lb 9.6 oz (123.7 kg)  04/30/16 266 lb (120.7 kg)   Well developed, pleasant female in no distress Heart: regular rate and rhythm Lungs: clear bilaterally Extremities: no edema Psych: normal mood, affect, hygiene and grooming Neuro: cranial nerves grossly intact, normal strength, gait  ASSESSMENT/PLAN:  Migraine with typical aura - infrequent; previously tolerated topamax.  Will not try its use for weight loss (using 2 generics in place of Qsymia). risks/side effects reviewed - Plan: phentermine 37.5 MG capsule  Morbid obesity, unspecified obesity type ([place]) - FFO for [facility] for surgical eval; take phentermine 1/2 tablet in am, and start at 1/2 tab of topamax and gradually titrate up to 100mg  over the next 1-2  months - Plan: topiramate (TOPAMAX) 50 MG tablet  Essential hypertension - controlled  Diabetes mellitus without complication ([place]) - well controlled per last check; repeat A1c at 3 month f/u.  Encouraged regular exercise   Discussed both Belviq and Contrave in detail as a Plan C, if the current plan is not effective.  For now--rather than using Qsymia (not covered), will decrease the phentermine to 1/2 tablet once daily, and adding topiramate.   She is also considering surgical options. Considering the gastric sleeve  Forms dropped off for bariatric-filled out   25 min visit, more than 1/2 spent counseling  F/u as scheduled in December

## 2016-07-05 ENCOUNTER — Other Ambulatory Visit: Payer: Self-pay | Admitting: *Deleted

## 2016-07-05 ENCOUNTER — Ambulatory Visit (INDEPENDENT_AMBULATORY_CARE_PROVIDER_SITE_OTHER): Payer: 59 | Admitting: Family Medicine

## 2016-07-05 ENCOUNTER — Encounter: Payer: Self-pay | Admitting: Family Medicine

## 2016-07-05 VITALS — BP 114/72 | HR 80 | Ht 64.0 in | Wt 275.2 lb

## 2016-07-05 DIAGNOSIS — I10 Essential (primary) hypertension: Secondary | ICD-10-CM | POA: Diagnosis not present

## 2016-07-05 DIAGNOSIS — E119 Type 2 diabetes mellitus without complications: Secondary | ICD-10-CM | POA: Diagnosis not present

## 2016-07-05 DIAGNOSIS — G43109 Migraine with aura, not intractable, without status migrainosus: Secondary | ICD-10-CM

## 2016-07-05 MED ORDER — TOPIRAMATE 50 MG PO TABS
ORAL_TABLET | ORAL | 2 refills | Status: DC
Start: 2016-07-05 — End: 2016-08-10

## 2016-07-05 MED ORDER — SIMVASTATIN 40 MG PO TABS: 40.0000 mg | ORAL_TABLET | Freq: Every evening | ORAL | 0 refills | Status: DC

## 2016-07-05 MED ORDER — PHENTERMINE HCL 37.5 MG PO CAPS: ORAL_CAPSULE | ORAL | 2 refills | Status: DC

## 2016-07-05 MED ORDER — LISINOPRIL 10 MG PO TABS: 10.0000 mg | ORAL_TABLET | Freq: Every day | ORAL | 0 refills | Status: DC

## 2016-07-05 MED FILL — SIMVASTATIN 40 MG TABLET: 40 | 90 days supply | Qty: 90 | Fill #0

## 2016-07-05 MED FILL — LISINOPRIL 10 MG TABLET: 10 | 90 days supply | Qty: 90 | Fill #0

## 2016-07-05 MED FILL — TOPIRAMATE 50 MG TABLET: 50 | 90 days supply | Qty: 60 | Fill #0

## 2016-07-06 ENCOUNTER — Telehealth: Payer: Self-pay | Admitting: Family Medicine

## 2016-07-06 ENCOUNTER — Encounter: Payer: Self-pay | Admitting: Family Medicine

## 2016-07-06 MED FILL — PHENTERMINE 37.5 MG TABLET: 37.5 | 30 days supply | Qty: 30 | Fill #0

## 2016-07-06 NOTE — Telephone Encounter (Signed)
Faxed letter of medical necessity and weight loss form to [facility] fax# [phone]

## 2016-07-08 ENCOUNTER — Ambulatory Visit: Payer: 59 | Admitting: Obstetrics and Gynecology

## 2016-07-09 ENCOUNTER — Encounter: Payer: Self-pay | Admitting: Obstetrics and Gynecology

## 2016-07-09 ENCOUNTER — Ambulatory Visit (INDEPENDENT_AMBULATORY_CARE_PROVIDER_SITE_OTHER): Payer: 59 | Admitting: Obstetrics and Gynecology

## 2016-07-09 ENCOUNTER — Other Ambulatory Visit: Payer: Self-pay | Admitting: Obstetrics and Gynecology

## 2016-07-09 VITALS — BP 140/90 | HR 78 | Resp 20 | Ht 63.75 in | Wt 273.6 lb

## 2016-07-09 DIAGNOSIS — Z01419 Encounter for gynecological examination (general) (routine) without abnormal findings: Secondary | ICD-10-CM

## 2016-07-09 DIAGNOSIS — L293 Anogenital pruritus, unspecified: Secondary | ICD-10-CM

## 2016-07-09 DIAGNOSIS — N762 Acute vulvitis: Secondary | ICD-10-CM | POA: Diagnosis not present

## 2016-07-09 DIAGNOSIS — Z1231 Encounter for screening mammogram for malignant neoplasm of breast: Secondary | ICD-10-CM

## 2016-07-09 DIAGNOSIS — E669 Obesity, unspecified: Secondary | ICD-10-CM | POA: Diagnosis not present

## 2016-07-09 MED ORDER — BETAMETHASONE VALERATE 0.1 % EX OINT: TOPICAL_OINTMENT | CUTANEOUS | 1 refills | Status: DC

## 2016-07-09 MED FILL — BETAMETHASONE 0.1% OINTMENT: 0.1 | 14 days supply | Qty: 45 | Fill #0

## 2016-07-09 NOTE — Patient Instructions (Signed)

## 2016-07-09 NOTE — Progress Notes (Signed)
60 y.o. G0P0000 MarriedCaucasianF here for annual exam.  H/O TAH/BSO. No bleeding. Not sexually active.  She had a breast papilloma removed last year, seeing oncology every 6 months. On Tamoxifen.  Diabetes is well controlled.  Father in law in hospice in [place], traveling back and forth every weekend. Uncle in hospice in [place], [place] is having a really hard time with her younger brother dying.  She has long term issues with intermittent vulvar itching and irritation. Worse at night.     Patient's last menstrual period was 10/18/1982.          Sexually active: No.  The current method of family planning is status post hysterectomy.    Exercising: Yes.    Walking, Bicycle 4 days a week Smoker:  no  Health Maintenance: Pap:  2011? History of abnormal Pap:  no MMG: 01/01/15 BIRADS2, 07/15/15 Dx & U/S L Breast  Core biopsy-BIRADS4; 07/21/15 Seed implant; 07/23/15 Lumpectomy Colonoscopy: 2009 Reepat 10 yrs Dr. Collene Mares BMD:  08/24/2012 Mild Osteopenia TDaP: 06/2015    reports that she has never smoked. She has never used smokeless tobacco. She reports that she does not drink alcohol or use drugs.She is a Software engineer.   Past Medical History:  Diagnosis Date  . Blood transfusion without reported diagnosis 1983  . Diabetes mellitus without complication ([place]) [id number]   T2DM  . Dyspareunia   . Exercise-induced asthma    worse in winter  . H/O bone density study 2013  . H/O cold sores   . H/O colonoscopy 2009  . History of endometriosis   . Hyperlipidemia   . Hypertension   . Injury of tendon of rotator cuff 2015   gym injury (right)  . Knee pain 08/2015   L>R  . Migraine with typical aura   . Morbid obesity ([place]) 09/04/12   BMI 49.4 kg/m^2     Past Surgical History:  Procedure Laterality Date  . BREAST BIOPSY  2010  . BREAST LUMPECTOMY  2010  . BREAST LUMPECTOMY WITH RADIOACTIVE SEED LOCALIZATION Left 07/23/2015   Procedure: LEFT BREAST SEED GUIDED EXCISION;  Surgeon: Rolm Bookbinder, MD;  Location: [facility];  Service: General;  Laterality: Left;  . BREATH TEK H PYLORI  08/28/2012   Procedure: BREATH TEK H PYLORI;  Surgeon: Pedro Earls, MD;  Location: [facility];  Service: General;  Laterality: N/A;  . CHOLECYSTECTOMY  2007  . EXPLORATORY LAPAROTOMY  1983   endometriosis  . GANGLION CYST EXCISION Left 1999   Foot  . TOTAL ABDOMINAL HYSTERECTOMY W/ BILATERAL SALPINGOOPHORECTOMY  1984   endometriosis (and appendectomy)    Current Outpatient Prescriptions  Medication Sig Dispense Refill  . albuterol (PROVENTIL HFA;VENTOLIN HFA) 108 (90 BASE) MCG/ACT inhaler Inhale 2 puffs into the lungs every 6 (six) hours as needed for wheezing. Reported on 03/31/2016    . Canagliflozin (INVOKANA) 300 MG TABS Take 1 tablet by mouth every morning.    . Cholecalciferol (VITAMIN D3) 2000 UNITS TABS Take 2,000 Units by mouth daily.    . cyclobenzaprine (FLEXERIL) 10 MG tablet Take 1 tablet (10 mg total) by mouth 3 (three) times daily as needed for muscle spasms. 30 tablet 0  . EPINEPHrine (EPI-PEN) 0.3 mg/0.3 mL SOAJ injection Inject 0.3 mg into the muscle once as needed. Reported on 04/30/2016    . glucose blood (TRUE METRIX BLOOD GLUCOSE TEST) test strip Test BID 100 each 3  . lisinopril (PRINIVIL,ZESTRIL) 10 MG tablet Take 1 tablet (10 mg  total) by mouth daily. 90 tablet 0  . Magnesium 250 MG TABS Take 250 mg by mouth daily.    . metFORMIN (GLUCOPHAGE-XR) 500 MG 24 hr tablet Take 1,000 mg by mouth 2 (two) times daily before a meal.    . Multiple Vitamins-Minerals (MULTIVITAMIN PO) Take 1 tablet by mouth daily. Reported on 03/31/2016    . phentermine 37.5 MG capsule Take 1/2-1 tablet every morning 30 capsule 2  . promethazine (PHENERGAN) 25 MG tablet Take 1 tablet (25 mg total) by mouth every 8 (eight) hours as needed for nausea. 30 tablet 0  . simvastatin (ZOCOR) 40 MG tablet Take 1 tablet (40 mg total) by mouth every evening. 90 tablet 0  . tamoxifen  (NOLVADEX) 20 MG tablet Take 1 tablet (20 mg total) by mouth daily. 90 tablet 3  . topiramate (TOPAMAX) 50 MG tablet Start at 1/2 tablet by mouth at bedtime. Titrate up every 2-4 weeks up to 100mg  each evening 60 tablet 2  . TRUEPLUS LANCETS 33G MISC 1 each by Does not apply route 2 (two) times daily at 10 AM and 5 PM. 100 each 3  . valACYclovir (VALTREX) 1000 MG tablet Take 2,000 mg by mouth 2 (two) times daily as needed. Reported on 03/31/2016     No current facility-administered medications for this visit.     Family History  Problem Relation Age of Onset  . Diabetes Father   . Heart attack Father 13  . Heart disease Father   . Hypertension Father   . Diabetes Paternal Grandfather   . Heart disease Paternal Grandfather   . Osteoporosis Maternal Grandmother   . Heart failure Maternal Grandmother   . Thyroid disease Mother     hypothyroidism  . Heart disease Mother 79    CABG at 50  . Hypertension Mother   . Pneumonia Sister   . Cancer Maternal Grandfather     lung  . Dementia Paternal Grandmother   . Cancer Paternal Grandmother     melanoma on her foot (not related to death)  . Breast cancer Paternal Aunt     25's  . Cancer Maternal Aunt     lung  . Cancer Maternal Uncle     lung    Review of Systems  Constitutional: Negative.   HENT: Negative.   Eyes: Negative.   Respiratory: Negative.   Cardiovascular: Negative.   Gastrointestinal: Negative.   Endocrine: Negative.   Genitourinary: Negative.   Musculoskeletal: Negative.   Skin: Negative.   Allergic/Immunologic: Negative.   Neurological: Negative.   Hematological: Negative.   Psychiatric/Behavioral: Negative.     Exam:   BP 140/90 (BP Location: Right Arm, Patient Position: Sitting, Cuff Size: Large)   Pulse 78   Resp 20   Ht 5' 3.75" (1.619 m)   Wt 273 lb 9.6 oz (124.1 kg)   LMP 10/18/1982   BMI 47.33 kg/m   Weight change: @WEIGHTCHANGE @ Height:   Height: 5' 3.75" (161.9 cm)  Ht Readings from Last 3  Encounters:  07/09/16 5' 3.75" (1.619 m)  07/05/16 5\' 4"  (1.626 m)  05/20/16 5' 3.75" (1.619 m)    General appearance: alert, cooperative and appears stated age Head: Normocephalic, without obvious abnormality, atraumatic Neck: no adenopathy, supple, symmetrical, trachea midline and thyroid normal to inspection and palpation Lungs: clear to auscultation bilaterally Breasts: evidence of left breast biopsy. No lumps, no skin changes.  Heart: regular rate and rhythm Abdomen: soft, non-tender; bowel sounds normal; no masses,  no organomegaly Extremities:  extremities normal, atraumatic, no cyanosis or edema Skin: Skin color, texture, turgor normal. No rashes or lesions Lymph nodes: Cervical, supraclavicular, and axillary nodes normal. No abnormal inguinal nodes palpated Neurologic: Grossly normal   Pelvic: External genitalia:  Atrophic, loss of architecture, mild erythema, fissure above the clitoris              Urethra:  normal appearing urethra with no masses, tenderness or lesions              Bartholins and Skenes: normal                 Vagina: normal appearing vagina with normal color and discharge, no lesions              Cervix: absent               Bimanual Exam:  Uterus:  uterus absent              Adnexa: no mass, fullness, tenderness               Rectovaginal: Confirms               Anus:  normal sphincter tone, no lesions  Chaperone was present for exam.  A:  Well Woman with normal exam  Obesity, she is considering surgery. She just started on medication for weight loss and has a consult for possible surgery next month  Genital pruritus  P:   No pap   Wet prep probe  Discussed vulvar skin care  Use Vaseline daily  Will call in valisone to use prn  Mammogram  Colonoscopy UTD  Discussed breast self exam  Discussed calcium and vit D intake  Given information on weight loss clinics

## 2016-07-12 LAB — WET PREP BY MOLECULAR PROBE
CANDIDA SPECIES: NEGATIVE
Gardnerella vaginalis: NEGATIVE
Trichomonas vaginosis: NEGATIVE

## 2016-07-16 ENCOUNTER — Ambulatory Visit
Admission: RE | Admit: 2016-07-16 | Discharge: 2016-07-16 | Disposition: A | Discharge status: Home / Self Care | Payer: 59 | Source: Ambulatory Visit | Attending: Obstetrics and Gynecology | Admitting: Obstetrics and Gynecology

## 2016-07-16 DIAGNOSIS — Z1231 Encounter for screening mammogram for malignant neoplasm of breast: Secondary | ICD-10-CM

## 2016-07-23 MED FILL — METFORMIN HCL ER 500 MG TAB: 500 | 90 days supply | Qty: 180 | Fill #4

## 2016-07-28 MED FILL — BETAMETHASONE 0.1% OINTMENT: 0.1 | 14 days supply | Qty: 45 | Fill #0

## 2016-07-28 MED FILL — TRUEplus LANCETS 30G MISC: 50 days supply | Qty: 100 | Fill #1

## 2016-07-28 MED FILL — TRUE METRIX GLUCOSE TEST ST: 50 days supply | Qty: 100 | Fill #1

## 2016-08-02 MED FILL — TAMOXIFEN 20 MG TABLET: 20 | 90 days supply | Qty: 90 | Fill #2

## 2016-08-10 ENCOUNTER — Encounter: Payer: Self-pay | Admitting: Family Medicine

## 2016-08-10 MED ORDER — TOPIRAMATE 100 MG PO TABS: 100.0000 mg | ORAL_TABLET | Freq: Every day | ORAL | 0 refills | Status: DC

## 2016-08-10 MED FILL — TOPIRAMATE 100 MG TABLET: 100 | 90 days supply | Qty: 90 | Fill #0

## 2016-08-10 MED FILL — PHENTERMINE 37.5 MG TABLET: 37.5 | 30 days supply | Qty: 30 | Fill #1

## 2016-08-20 ENCOUNTER — Encounter: Payer: Self-pay | Admitting: Family Medicine

## 2016-08-20 MED ORDER — METFORMIN HCL ER 500 MG PO TB24: 1000.0000 mg | ORAL_TABLET | Freq: Two times a day (BID) | ORAL | 1 refills | Status: DC

## 2016-08-23 MED FILL — METFORMIN HCL ER 500 MG TAB: 500 | 90 days supply | Qty: 360 | Fill #0

## 2016-09-13 MED FILL — TRUE METRIX GLUCOSE TEST ST: 50 days supply | Qty: 100 | Fill #2

## 2016-09-13 MED FILL — TRUEplus LANCETS 30G MISC: 50 days supply | Qty: 100 | Fill #2

## 2016-09-13 MED FILL — PHENTERMINE 37.5 MG TABLET: 37.5 | 30 days supply | Qty: 30 | Fill #2

## 2016-09-29 MED FILL — metroNIDAZOLE 0.75 % LOTN: 0.75 | 30 days supply | Qty: 59 | Fill #0

## 2016-09-29 NOTE — Progress Notes (Signed)
Chief Complaint  Patient presents with  . Hypertension    fasting med check. Needs refill on lisinopril on simvastatin.     Patient presents for follow-up on chronic problems.  Obesity:  Topiramate was added to her phentermine 3 months ago, when only limited benefit was noted with phentermine alone.  This was titrated up to a current dose of '100mg'$ .  She is tolerating these medications without side effects, just a dry mouth which is tolerable. She is taking phentermine 1/2 tablet twice daily as this is most effective for her. She has lost almost 20# since her last visit. She went to a class by Iver Nestle on mindful eating, which has been really helpful to her.  She is happy to eat (and enjoy every bite) just 1/4th of a lemon bar as a treat, and pass up all the other goodies in the office for the holidays.  She had also been referred to [facility] for surgical considerations at last visit. We had also discussed both Belviq and Contrave in detail as a Plan C (if phentermine/topiramate not effective, if not ready for surgical options). Holding off on this while traveling back and forth to [facility] (father-in-law has pancreatic cancer). Exercise--[employer] 30-40 minutes 3-4x/week.  Diabetes follow-up:  Blood sugars at home are 100-110 in the mornings, also okay in the evenings. Her last A1c was 6.7% in June.  Denies hypoglycemia.  Denies polydipsia and polyuria.  Last eye exam was 09/2015, scheduled for 12/19.  Patient follows a low sugar diet and checks feet regularly without concerns. Sees [facility] to Wellness person at [facility]--plans to ask about her meter which may not be working correctly.    Hypertension follow-up:  Blood pressures are 130/80 immediately after exercise, when pulse is still 140.  Denies dizziness, headaches (just migraines), chest pain.  Denies side effects of medications.  Migraines--previously reported to get about 3x/year, has aura (green fuzzy vision change). She hasn't had a  migraine "in a long time", doing well. Has used ibuprofen or aleve with good results, sometimes requires phenergan also.  Hyperlipidemia follow-up:  Patient is reportedly following a low-fat, low cholesterol diet.  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 Due to lifetime risk of breast cancer of 25%, she is on risk lowering therapy with Tamoxifen.  She last saw Dr. Lindi Adie in 05/2016 and is tolerating Tamoxifen without side effects.  PMH, PSH, SH and FH were reviewed and updated  Outpatient Encounter Prescriptions as of 09/30/2016  Medication Sig Note  . Canagliflozin (INVOKANA) 300 MG TABS Take 1 tablet by mouth every morning.   . Cholecalciferol (VITAMIN D3) 2000 UNITS TABS Take 2,000 Units by mouth daily.   [name] glucose blood (TRUE METRIX BLOOD GLUCOSE TEST) test strip Test BID   . lisinopril (PRINIVIL,ZESTRIL) 10 MG tablet Take 1 tablet (10 mg total) by mouth daily.   . Magnesium 250 MG TABS Take 250 mg by mouth daily. 03/31/2016: Takes for migraine prevention  . metFORMIN (GLUCOPHAGE-XR) 500 MG 24 hr tablet Take 2 tablets (1,000 mg total) by mouth 2 (two) times daily before a meal.   . METRONIDAZOLE, TOPICAL, 0.75 % LOTN Apply 1 application topically 2 (two) times daily.   . Multiple Vitamins-Minerals (MULTIVITAMIN PO) Take 1 tablet by mouth daily. Reported on 03/31/2016   . phentermine 37.5 MG capsule Take 1/2-1 tablet  every morning   . simvastatin (ZOCOR) 40 MG tablet Take 1 tablet (40 mg total) by mouth every evening.   . tamoxifen (NOLVADEX) 20 MG tablet Take 1 tablet (20 mg total) by mouth daily.   [name] topiramate (TOPAMAX) 100 MG tablet Take 1 tablet (100 mg total) by mouth at bedtime.   . TRUEPLUS LANCETS 33G MISC 1 each by Does not apply route 2 (two) times daily at 10 AM  and 5 PM.   . [DISCONTINUED] betamethasone valerate ointment (VALISONE) 0.1 % Apply a pea sized amount BID for up to 1 week prn   . [DISCONTINUED] lisinopril (PRINIVIL,ZESTRIL) 10 MG tablet Take 1 tablet (10 mg total) by mouth daily.   . [DISCONTINUED] phentermine 37.5 MG capsule Take 1/2-1 tablet every morning   . [DISCONTINUED] simvastatin (ZOCOR) 40 MG tablet Take 1 tablet (40 mg total) by mouth every evening.   [name] albuterol (PROVENTIL HFA;VENTOLIN HFA) 108 (90 BASE) MCG/ACT inhaler Inhale 2 puffs into the lungs every 6 (six) hours as needed for wheezing. Reported on 03/31/2016   . cyclobenzaprine (FLEXERIL) 10 MG tablet Take 1 tablet (10 mg total) by mouth 3 (three) times daily as needed for muscle spasms. (Patient not taking: Reported on 09/30/2016)   . EPINEPHrine (EPI-PEN) 0.3 mg/0.3 mL SOAJ injection Inject 0.3 mg into the muscle once as needed. Reported on 04/30/2016   . promethazine (PHENERGAN) 25 MG tablet Take 1 tablet (25 mg total) by mouth every 8 (eight) hours as needed for nausea. (Patient not taking: Reported on 09/30/2016)   . valACYclovir (VALTREX) 1000 MG tablet Take 2,000 mg by mouth 2 (two) times daily as needed. Reported on 03/31/2016 03/31/2016: Uses prn cold sores   No facility-administered encounter medications on file as of 09/30/2016.    Allergies  Allergen Reactions  . Bee Venom Anaphylaxis  . Shellfish Allergy Anaphylaxis  . Penicillins Hives   ROS: no fever, chills, headaches, dizziness, chest pain, shortness of breath, GI complaints or other concerns.  PHYSICAL EXAM:  BP 120/74 (BP Location: Left Arm, Patient Position: Sitting, Cuff Size: Normal)   Pulse 84   Ht 5' 3.75" (1.619 m)   Wt 256 lb 9.6 oz (116.4 kg)   LMP 10/18/1982   BMI 44.39 kg/m   Well appearing, pleasant female in no distress HEENT: conjunctiva and sclera are clear, OP clear Neck: no lymphadenopathy, thyromegaly or bruit Heart: regular rate and rhythm without murmur Lungs: clear  bilaterally Abdomen: obese, soft, nontender, no mass Extremities: no edema, 2+ pulses Skin: normal turgor, no lesions or rashes Neuro: alert and oriented, normal cranial nerves and gait Psych: normal mood, affect, hygiene and grooming  A1c 6.0%  ASSESSMENT/PLAN:  Diabetes mellitus without complication ([facility]) - well controlled, improved with weight loss - Plan: Comprehensive metabolic panel  Essential hypertension - well controlled - Plan: Comprehensive metabolic panel, lisinopril (PRINIVIL,ZESTRIL) 10 MG tablet  Hyperlipidemia, unspecified hyperlipidemia type - Plan: Lipid panel, simvastatin (ZOCOR) 40 MG tablet  Medication monitoring encounter - Plan: Lipid panel, Comprehensive metabolic panel, CBC with Differential/Platelet  Migraine with typical aura - infrequent; doing well on topiramate for weight loss (using 2 generics in place of Qsymia). Continue  Severe obesity (BMI >= 40) ([facility]) - continue phentermine and topiramate. Ideally would get phentermine to 1/2 tab daily (for qsymia dosing equivalent) - Plan: phentermine 37.5 MG capsule   c-met, CBC, lipid  Briefly discussed Shingrix--she is very interested and will get when available.  F/u 3 months on weight, 6 months for CPE  Continue current doses of phentermine and topiramate.  Ultimately decreasing the phentermine to 1/2 tablet daily is more consistent with qsymia dosing (for longer term use).  If doing very well, and you'd like to try cutting back on the topirimate dose, you can cut back to 50m to see if that is as effective.

## 2016-09-30 ENCOUNTER — Encounter: Payer: Self-pay | Admitting: Family Medicine

## 2016-09-30 ENCOUNTER — Ambulatory Visit (INDEPENDENT_AMBULATORY_CARE_PROVIDER_SITE_OTHER): Payer: 59 | Admitting: Family Medicine

## 2016-09-30 VITALS — BP 120/74 | HR 84 | Ht 63.75 in | Wt 256.6 lb

## 2016-09-30 DIAGNOSIS — Z5181 Encounter for therapeutic drug level monitoring: Secondary | ICD-10-CM | POA: Diagnosis not present

## 2016-09-30 DIAGNOSIS — I10 Essential (primary) hypertension: Secondary | ICD-10-CM | POA: Diagnosis not present

## 2016-09-30 DIAGNOSIS — E119 Type 2 diabetes mellitus without complications: Secondary | ICD-10-CM

## 2016-09-30 DIAGNOSIS — G43109 Migraine with aura, not intractable, without status migrainosus: Secondary | ICD-10-CM

## 2016-09-30 DIAGNOSIS — E785 Hyperlipidemia, unspecified: Secondary | ICD-10-CM | POA: Diagnosis not present

## 2016-09-30 LAB — POCT GLYCOSYLATED HEMOGLOBIN (HGB A1C): HEMOGLOBIN A1C: 6

## 2016-09-30 MED ORDER — SIMVASTATIN 40 MG PO TABS: 40.0000 mg | ORAL_TABLET | Freq: Every evening | ORAL | 1 refills | Status: DC

## 2016-09-30 MED ORDER — CANAGLIFLOZIN 300 MG PO TABS: 300.0000 mg | ORAL_TABLET | ORAL | 1 refills | Status: DC

## 2016-09-30 MED ORDER — LISINOPRIL 10 MG PO TABS: 10.0000 mg | ORAL_TABLET | Freq: Every day | ORAL | 1 refills | Status: DC

## 2016-09-30 MED ORDER — PHENTERMINE HCL 37.5 MG PO CAPS: ORAL_CAPSULE | ORAL | 0 refills | Status: DC

## 2016-09-30 MED FILL — LISINOPRIL 10 MG TABLET: 10 | 90 days supply | Qty: 90 | Fill #0

## 2016-09-30 MED FILL — INVOKANA 300 MG TABLET: 300 | 90 days supply | Qty: 90 | Fill #0

## 2016-09-30 MED FILL — SIMVASTATIN 40 MG TABLET: 40 | 90 days supply | Qty: 90 | Fill #0

## 2016-09-30 NOTE — Addendum Note (Signed)
Addended byRita Ohara on: 09/30/2016 12:02 PM   Modules accepted: Orders

## 2016-09-30 NOTE — Addendum Note (Signed)
Addended by: Carolee Rota F on: 09/30/2016 10:54 AM   Modules accepted: Orders

## 2016-09-30 NOTE — Patient Instructions (Signed)
Continue current doses of phentermine and topiramate.  Ultimately decreasing the phentermine to 1/2 tablet daily is more consistent with qsymia dosing (for longer term use).  If doing very well, and you'd like to try cutting back on the topirimate dose, you can cut back to 50mg  to see if that is as effective.  Continue your current medications. Continue your regular exercise. Congratulations on your healthy eating and weight loss--keep it up!

## 2016-10-01 LAB — LIPID PANEL
CHOL/HDL RATIO: 2.3 ratio (ref <5.0)
CHOLESTEROL: 124 mg/dL (ref <200)
HDL: 55 mg/dL (ref >50)
LDL Cholesterol: 43 mg/dL (ref <100)
Triglycerides: 128 mg/dL (ref <150)
VLDL: 26 mg/dL (ref <30)

## 2016-10-01 LAB — CBC WITH DIFFERENTIAL/PLATELET
BASOS ABS: 0 {cells}/uL (ref 0–200)
Basophils Relative: 0 %
EOS PCT: 2 %
Eosinophils Absolute: 156 cells/uL (ref 15–500)
HCT: 48 % — ABNORMAL HIGH (ref 35.0–45.0)
HEMOGLOBIN: 15.9 g/dL — AB (ref 11.7–15.5)
LYMPHS ABS: 3042 {cells}/uL (ref 850–3900)
Lymphocytes Relative: 39 %
MCH: 32.3 pg (ref 27.0–33.0)
MCHC: 33.1 g/dL (ref 32.0–36.0)
MCV: 97.6 fL (ref 80.0–100.0)
MONOS PCT: 7 %
MPV: 11.9 fL (ref 7.5–12.5)
Monocytes Absolute: 546 cells/uL (ref 200–950)
NEUTROS PCT: 52 %
Neutro Abs: 4056 cells/uL (ref 1500–7800)
Platelets: 123 10*3/uL — ABNORMAL LOW (ref 140–400)
RBC: 4.92 MIL/uL (ref 3.80–5.10)
RDW: 14.2 % (ref 11.0–15.0)
WBC: 7.8 10*3/uL (ref 4.0–10.5)

## 2016-10-01 LAB — COMPREHENSIVE METABOLIC PANEL
ALK PHOS: 55 U/L (ref 33–130)
ALT: 26 U/L (ref 6–29)
AST: 37 U/L — AB (ref 10–35)
Albumin: 4.1 g/dL (ref 3.6–5.1)
BUN: 15 mg/dL (ref 7–25)
CO2: 23 mmol/L (ref 20–31)
Calcium: 9.3 mg/dL (ref 8.6–10.4)
Chloride: 104 mmol/L (ref 98–110)
Creat: 0.93 mg/dL (ref 0.50–1.05)
GLUCOSE: 96 mg/dL (ref 65–99)
Potassium: 4.6 mmol/L (ref 3.5–5.3)
SODIUM: 137 mmol/L (ref 135–146)
Total Bilirubin: 0.7 mg/dL (ref 0.2–1.2)
Total Protein: 7 g/dL (ref 6.1–8.1)

## 2016-10-04 ENCOUNTER — Encounter: Payer: Self-pay | Admitting: Family Medicine

## 2016-10-04 DIAGNOSIS — B001 Herpesviral vesicular dermatitis: Secondary | ICD-10-CM

## 2016-10-04 MED ORDER — VALACYCLOVIR HCL 1 G PO TABS: ORAL_TABLET | ORAL | 0 refills | Status: DC

## 2016-10-04 MED FILL — valACYclovir HCL 1 GM TABS: 1 | 1 days supply | Qty: 40 | Fill #0

## 2016-10-05 DIAGNOSIS — H5203 Hypermetropia, bilateral: Secondary | ICD-10-CM | POA: Diagnosis not present

## 2016-10-05 LAB — HM DIABETES EYE EXAM

## 2016-10-06 ENCOUNTER — Encounter: Payer: Self-pay | Admitting: *Deleted

## 2016-10-19 MED FILL — PHENTERMINE 37.5 MG TABLET: 37.5 | 90 days supply | Qty: 90 | Fill #0

## 2016-10-22 ENCOUNTER — Ambulatory Visit: Payer: Self-pay | Admitting: Pharmacist

## 2016-10-22 MED FILL — metroNIDAZOLE 0.75 % LOTN: 0.75 | 30 days supply | Qty: 59 | Fill #1

## 2016-10-25 MED FILL — TAMOXIFEN 20 MG TABLET: 20 | 90 days supply | Qty: 90 | Fill #3

## 2016-11-07 ENCOUNTER — Other Ambulatory Visit: Payer: Self-pay | Admitting: Family Medicine

## 2016-11-08 MED ORDER — TOPIRAMATE 100 MG PO TABS: 100.0000 mg | ORAL_TABLET | Freq: Every day | ORAL | 0 refills | Status: DC

## 2016-11-08 MED FILL — TOPIRAMATE 100 MG TABLET: 100 | 90 days supply | Qty: 90 | Fill #0

## 2016-11-15 MED FILL — metroNIDAZOLE 0.75 % LOTN: 0.75 | 30 days supply | Qty: 59 | Fill #2

## 2016-11-15 MED FILL — METFORMIN HCL ER 500 MG TAB: 500 | 90 days supply | Qty: 360 | Fill #1

## 2016-12-26 ENCOUNTER — Telehealth: Payer: Self-pay

## 2016-12-26 NOTE — Telephone Encounter (Signed)
Spoke with patient and she is aware of her new appt  Cindy Carey

## 2016-12-28 MED FILL — INVOKANA 300 MG TABLET: 300 | 90 days supply | Qty: 90 | Fill #1

## 2016-12-28 MED FILL — SIMVASTATIN 40 MG TABLET: 40 | 90 days supply | Qty: 90 | Fill #1

## 2016-12-28 MED FILL — LISINOPRIL 10 MG TABLET: 10 | 90 days supply | Qty: 90 | Fill #1

## 2017-01-04 NOTE — Progress Notes (Signed)
Chief Complaint  Patient presents with  . Diabetes    fasting med check.     Patient was switched to separate topiramate and phentermine (from Qsymia) for cost purposes at last visit.  She has been tolerating this well without side effects, however admits that she has been very inconsistent in taking the phentermine over the last 3 months.  She father-in-law passed away the end of 11-21-22, her mother-in-law stayed with her for a month (which was extremely stressful).  She has just been focusingon keeping her life smooth, not focusing on taking the medication and weight loss.  She didn't feel that taking phentermine "was important enough".  She has maintained her good eating habits, not feeling hungry or eating too much. She continues to journal her food.  She had no problems at a recent office party. She has lost 3# since her last visit 3 months ago.  She states she isn't sure how helpful the phentermine truly is. She has been thinking of tapering off topamax too.  She had been referred to [facility] for surgical considerations in the past.  She elected to try medications first, and hold off on surgical consultation while traveling back and forth to [facility] (when her father-in-law was ill with his pancreatic cancer). Not interested at this time.  She admits that her exercise was less consistent when her mother-in-law was staying with her.  She has now resumed going to [employer] for 30-40 minutes 3-4x/week.   Diabetes follow-up: She has some hypoglycemia before going to the gym.  She started having a snack prior to going to the gym, and doing better.  Snacks if glu <80 before exercise.  Sugars have been running 79-139, mostly 90-110.  Her last A1c was 6.0% in December, down from 6.7% in June. Denies polydipsia and polyuria. Eye exam UTD. Patient follows a low sugar diet and checks feet regularly without concerns. Sees [facility] person at [facility].    Hypertension follow-up: Blood pressures are  110/70 (checks when she visits her parents).   Denies dizziness, headaches, chest pain. Denies side effects of medications.  Migraines--(has aura, green fuzzy vision change). One every 3-4 months, mild.  6 months ago she had a more severe one, needed to take phenergan.  Hadn't had enough fluids. Has used ibuprofen or aleve with good results for milder headaches, sometimes requires phenergan also.  Hyperlipidemia follow-up: Patient is reportedly following a low-fat, low cholesterol diet. Compliant with medications and denies medication side effects Lab Results  Component Value Date   CHOL 124 09/30/2016   HDL 55 09/30/2016   LDLCALC 43 09/30/2016   TRIG 128 09/30/2016   CHOLHDL 2.3 09/30/2016   PMH, PSH, SH reviewed  Outpatient Encounter Prescriptions as of 01/05/2017  Medication Sig Note  . ACCU-CHEK FASTCLIX LANCETS MISC by Does not apply route.   . canagliflozin (INVOKANA) 300 MG TABS tablet Take 1 tablet (300 mg total) by mouth every morning.   . Cholecalciferol (VITAMIN D3) 2000 UNITS TABS Take 2,000 Units by mouth daily.   [name] glucose blood test strip 1 each by Other route as needed for other. Accu Check Guide   . lisinopril (PRINIVIL,ZESTRIL) 10 MG tablet Take 1 tablet (10 mg total) by mouth daily.   . Magnesium 250 MG TABS Take 250 mg by mouth daily. 03/31/2016: Takes for migraine prevention  . metFORMIN (GLUCOPHAGE-XR) 500 MG 24 hr tablet Take 2 tablets (1,000 mg total) by mouth 2 (two) times daily before a meal.   .  METRONIDAZOLE, TOPICAL, 0.75 % LOTN Apply 1 application topically 2 (two) times daily.   . Multiple Vitamins-Minerals (MULTIVITAMIN PO) Take 1 tablet by mouth daily. Reported on 03/31/2016   . phentermine 37.5 MG capsule Take 1/2-1 tablet every morning   . simvastatin (ZOCOR) 40 MG tablet Take 1 tablet (40 mg total) by mouth every evening.   . tamoxifen (NOLVADEX) 20 MG tablet Take 1 tablet (20 mg total) by mouth daily.   [name] topiramate (TOPAMAX) 100 MG tablet Take 1  tablet (100 mg total) by mouth at bedtime.   [name] albuterol (PROVENTIL HFA;VENTOLIN HFA) 108 (90 BASE) MCG/ACT inhaler Inhale 2 puffs into the lungs every 6 (six) hours as needed for wheezing. Reported on 03/31/2016   . cyclobenzaprine (FLEXERIL) 10 MG tablet Take 1 tablet (10 mg total) by mouth 3 (three) times daily as needed for muscle spasms. (Patient not taking: Reported on 09/30/2016)   . EPINEPHrine (EPI-PEN) 0.3 mg/0.3 mL SOAJ injection Inject 0.3 mg into the muscle once as needed. Reported on 04/30/2016   . promethazine (PHENERGAN) 25 MG tablet Take 1 tablet (25 mg total) by mouth every 8 (eight) hours as needed for nausea. (Patient not taking: Reported on 09/30/2016)   . valACYclovir (VALTREX) 1000 MG tablet Take 2 tablets together at onset of cold sore.  Repeat dose once in 12 hours (full course is 4 tabs) (Patient not taking: Reported on 01/05/2017)   . [DISCONTINUED] glucose blood (TRUE METRIX BLOOD GLUCOSE TEST) test strip Test BID   . [DISCONTINUED] phentermine (ADIPEX-P) 37.5 MG tablet    . [DISCONTINUED] TRUEPLUS LANCETS 33G MISC 1 each by Does not apply route 2 (two) times daily at 10 AM and 5 PM.    No facility-administered encounter medications on file as of 01/05/2017.    Allergies  Allergen Reactions  . Bee Venom Anaphylaxis  . Shellfish Allergy Anaphylaxis  . Penicillins Hives   ROS:  No fever, chills, dizziness, chest pain, shortness of breath, GI complaints. Moods are good. Headaches per HPI.  No bleeding, bruising, rash or other concerns.   PHYSICAL EXAM:  BP (!) 142/88 (BP Location: Left Arm, Patient Position: Sitting, Cuff Size: Normal)   Pulse 76   Ht 5' 3.75" (1.619 m)   Wt 253 lb 3.2 oz (114.9 kg)   LMP 10/18/1982   BMI 43.80 kg/m   Wt Readings from Last 3 Encounters:  01/05/17 253 lb 3.2 oz (114.9 kg)  09/30/16 256 lb 9.6 oz (116.4 kg)  07/09/16 273 lb 9.6 oz (124.1 kg)   120/76 on repeat by MD  Well appearing, pleasant, obese female, in good  spirits HEENT: conjunctiva and sclera are clear, EOMI Neck: no lymphadenopathy, thyromegaly or mass Heart: regular rate and rhythm Lungs: clear bilaterally Abdomen: soft, nontender, no mass Extremities: no edema, normal pulses Skin: normal turgor, no rash Psych: normal mood, affect, hygiene and grooming   A1c 6.1% today.    ASSESSMENT/PLAN:   Essential hypertension - well controlled  Diabetes mellitus without complication ([place]) - well controlled - Plan: HgB A1c  Hyperlipidemia, unspecified hyperlipidemia type - at goal per last labs  Severe obesity (BMI >= 40) ([facility]) - inconsistent with phentermine use.  only 3# wt loss. Rec resuming regular use of phentermine along with topirimate until getting closer to wt goals  Counseled/discussed diet, exercise, whether to continue meds vs stopping. Given lack of side effects, and very slow weight loss over the last 3 months when taking phentermine inconsistently, encouraged her to resume medication regularly  until getting closer to wt loss goals.  She is agreeable to this.    Doesn't currently need refills on meds (will next month, and will have pharmacy call).  f/u 4 mos---as scheduled in August for physical A1c, microalb, TSH, CBC, c-met, lipids at visit

## 2017-01-05 ENCOUNTER — Ambulatory Visit (INDEPENDENT_AMBULATORY_CARE_PROVIDER_SITE_OTHER): Payer: 59 | Admitting: Family Medicine

## 2017-01-05 ENCOUNTER — Encounter: Payer: Self-pay | Admitting: Family Medicine

## 2017-01-05 VITALS — BP 120/76 | HR 76 | Ht 63.75 in | Wt 253.2 lb

## 2017-01-05 DIAGNOSIS — E785 Hyperlipidemia, unspecified: Secondary | ICD-10-CM

## 2017-01-05 DIAGNOSIS — E119 Type 2 diabetes mellitus without complications: Secondary | ICD-10-CM

## 2017-01-05 DIAGNOSIS — I10 Essential (primary) hypertension: Secondary | ICD-10-CM | POA: Diagnosis not present

## 2017-01-05 LAB — POCT GLYCOSYLATED HEMOGLOBIN (HGB A1C): Hemoglobin A1C: 6.1

## 2017-01-05 NOTE — Patient Instructions (Signed)
Continue your same medications. Now that some of your stressors have improved, let's continue the current plan of medications, exercise. I would continue both the phentermine and topirimate for now--since there are no side effects, there is no rush to get off of these.  There is still a way to go to get to goal.  Keep up the good work!!

## 2017-01-17 ENCOUNTER — Encounter: Payer: Self-pay | Admitting: Hematology and Oncology

## 2017-01-17 ENCOUNTER — Other Ambulatory Visit: Payer: Self-pay | Admitting: *Deleted

## 2017-01-17 DIAGNOSIS — N6092 Unspecified benign mammary dysplasia of left breast: Secondary | ICD-10-CM

## 2017-01-17 MED ORDER — TAMOXIFEN CITRATE 20 MG PO TABS: 20.0000 mg | ORAL_TABLET | Freq: Every day | ORAL | 0 refills | Status: DC

## 2017-01-17 MED FILL — TAMOXIFEN 20 MG TABLET: 20 | 90 days supply | Qty: 90 | Fill #0

## 2017-01-26 ENCOUNTER — Ambulatory Visit (INDEPENDENT_AMBULATORY_CARE_PROVIDER_SITE_OTHER): Payer: 59 | Admitting: Obstetrics and Gynecology

## 2017-01-26 ENCOUNTER — Encounter: Payer: Self-pay | Admitting: Obstetrics and Gynecology

## 2017-01-26 VITALS — BP 118/80 | HR 88 | Temp 98.1°F | Resp 18 | Ht 63.75 in | Wt 252.0 lb

## 2017-01-26 DIAGNOSIS — N764 Abscess of vulva: Secondary | ICD-10-CM

## 2017-01-26 MED ORDER — SULFAMETHOXAZOLE-TRIMETHOPRIM 800-160 MG PO TABS: 1.0000 | ORAL_TABLET | Freq: Two times a day (BID) | ORAL | 0 refills | Status: DC

## 2017-01-26 MED FILL — SULFAMETHOXAZOLE/TMP DS TAB: 800-160 | 7 days supply | Qty: 14 | Fill #0

## 2017-01-26 NOTE — Progress Notes (Signed)
GYNECOLOGY  VISIT   HPI: 61 y.o.   Married  Caucasian  female   [id number] with Patient's last menstrual period was 10/18/1982.   here for vulvar sore/lump, started a week ago. It is hard and sore and painful. Not draining. Hurts to walk, sit in certain positions. Gets bigger by the end of the day.   GYNECOLOGIC HISTORY: Patient's last menstrual period was 10/18/1982. Contraception: hysterectomy  Menopausal hormone therapy: none        OB History    Gravida Para Term Preterm AB Living   0 0 0 0   0   SAB TAB Ectopic Multiple Live Births     0 0 0           Patient Active Problem List   Diagnosis Date Noted  . Atypical ductal hyperplasia of left breast 07/17/2015  . Migraine with typical aura   . Hypertension   . Hyperlipidemia   . Diabetes mellitus without complication ([place])   . History of endometriosis   . Diabetes ([place]) 06/01/2013  . Severe obesity (BMI >= 40) ([place]) 09/04/2012  . S/P breast biopsy, left 2011 [place] 06/07/2012  . Biliary dyskinesia-lap chole 2007 06/07/2012  . Obesity-BMI 49 06/07/2012    Past Medical History:  Diagnosis Date  . Blood transfusion without reported diagnosis 1983  . Diabetes mellitus without complication ([place]) 5573   T2DM  . Dyspareunia   . Exercise-induced asthma    worse in winter  . H/O bone density study 2013  . H/O cold sores   . H/O colonoscopy 2009  . History of endometriosis   . Hyperlipidemia   . Hypertension   . Injury of tendon of rotator cuff 2015   gym injury (right)  . Knee pain 08/2015   L>R  . Migraine with typical aura   . Morbid obesity ([place]) 09/04/12   BMI 49.4 kg/m^2     Past Surgical History:  Procedure Laterality Date  . BREAST BIOPSY  2010  . BREAST LUMPECTOMY  2010  . BREAST LUMPECTOMY WITH RADIOACTIVE SEED LOCALIZATION Left 07/23/2015   Procedure: LEFT BREAST SEED GUIDED EXCISION;  Surgeon: Rolm Bookbinder, MD;  Location: [facility];  Service: General;  Laterality: Left;  . BREATH  TEK H PYLORI  08/28/2012   Procedure: BREATH TEK H PYLORI;  Surgeon: Pedro Earls, MD;  Location: [facility];  Service: General;  Laterality: N/A;  . CHOLECYSTECTOMY  2007  . EXPLORATORY LAPAROTOMY  1983   endometriosis  . GANGLION CYST EXCISION Left 1999   Foot  . TOTAL ABDOMINAL HYSTERECTOMY W/ BILATERAL SALPINGOOPHORECTOMY  1984   endometriosis (and appendectomy)    Current Outpatient Prescriptions  Medication Sig Dispense Refill  . ACCU-CHEK FASTCLIX LANCETS MISC by Does not apply route.    [name] albuterol (PROVENTIL HFA;VENTOLIN HFA) 108 (90 BASE) MCG/ACT inhaler Inhale 2 puffs into the lungs every 6 (six) hours as needed for wheezing. Reported on 03/31/2016    . canagliflozin (INVOKANA) 300 MG TABS tablet Take 1 tablet (300 mg total) by mouth every morning. 90 tablet 1  . Cholecalciferol (VITAMIN D3) 2000 UNITS TABS Take 2,000 Units by mouth daily.    . cyclobenzaprine (FLEXERIL) 10 MG tablet Take 1 tablet (10 mg total) by mouth 3 (three) times daily as needed for muscle spasms. 30 tablet 0  . glucose blood test strip 1 each by Other route as needed for other. Accu Check Guide    . lisinopril (PRINIVIL,ZESTRIL) 10 MG tablet Take 1  tablet (10 mg total) by mouth daily. 90 tablet 1  . Magnesium 250 MG TABS Take 250 mg by mouth daily.    . metFORMIN (GLUCOPHAGE-XR) 500 MG 24 hr tablet Take 2 tablets (1,000 mg total) by mouth 2 (two) times daily before a meal. 360 tablet 1  . METRONIDAZOLE, TOPICAL, 0.75 % LOTN Apply 1 application topically 2 (two) times daily.    . Multiple Vitamins-Minerals (MULTIVITAMIN PO) Take 1 tablet by mouth daily. Reported on 03/31/2016    . phentermine 37.5 MG capsule Take 1/2-1 tablet every morning 90 capsule 0  . promethazine (PHENERGAN) 25 MG tablet Take 1 tablet (25 mg total) by mouth every 8 (eight) hours as needed for nausea. 30 tablet 0  . simvastatin (ZOCOR) 40 MG tablet Take 1 tablet (40 mg total) by mouth every evening. 90 tablet 1  . tamoxifen  (NOLVADEX) 20 MG tablet Take 1 tablet (20 mg total) by mouth daily. 90 tablet 0  . topiramate (TOPAMAX) 100 MG tablet Take 1 tablet (100 mg total) by mouth at bedtime. 90 tablet 0  . valACYclovir (VALTREX) 1000 MG tablet Take 2 tablets together at onset of cold sore.  Repeat dose once in 12 hours (full course is 4 tabs) 40 tablet 0  . EPINEPHrine (EPI-PEN) 0.3 mg/0.3 mL SOAJ injection Inject 0.3 mg into the muscle once as needed. Reported on 04/30/2016     No current facility-administered medications for this visit.      ALLERGIES: Bee venom; Shellfish allergy; and Penicillins  Family History  Problem Relation Age of Onset  . Diabetes Father   . Heart attack Father 34  . Heart disease Father   . Hypertension Father   . Diabetes Paternal Grandfather   . Heart disease Paternal Grandfather   . Osteoporosis Maternal Grandmother   . Heart failure Maternal Grandmother   . Thyroid disease Mother     hypothyroidism  . Heart disease Mother 37    CABG at 24  . Hypertension Mother   . Diverticulitis Mother 70    perforation, required surgery  . Pneumonia Sister   . Cancer Maternal Grandfather     lung  . Dementia Paternal Grandmother   . Cancer Paternal Grandmother     melanoma on her foot (not related to death)  . Breast cancer Paternal Aunt     107's  . Cancer Maternal Aunt     lung  . Cancer Maternal Uncle     lung  . Congestive Heart Failure Maternal Uncle     Social History   Social History  . Marital status: Married    Spouse name: N/A  . Number of children: N/A  . Years of education: N/A   Occupational History  . Not on file.   Social History Main Topics  . Smoking status: Never Smoker  . Smokeless tobacco: Never Used  . Alcohol use No  . Drug use: No  . Sexual activity: No   Other Topics Concern  . Not on file   Social History Narrative   Married, no children. 1 dog (mini daschund)   Epworth Sleepiness Scale = 1 (as of 08/13/15)    Review of Systems   Constitutional: Negative.   HENT: Negative.   Eyes: Negative.   Respiratory: Negative.   Cardiovascular: Negative.   Gastrointestinal: Negative.   Genitourinary: Negative.   Musculoskeletal: Negative.   Skin: Negative.   Neurological: Negative.   Endo/Heme/Allergies: Negative.   Psychiatric/Behavioral: Negative.     PHYSICAL EXAMINATION:  BP 118/80 (BP Location: Left Arm, Patient Position: Sitting, Cuff Size: Large)   Pulse 88   Temp 98.1 F (36.7 C) (Oral)   Resp 18   Ht 5' 3.75" (1.619 m)   Wt 252 lb (114.3 kg)   LMP 10/18/1982   BMI 43.60 kg/m     General appearance: alert, cooperative and appears stated age  Pelvic: External genitalia:  1.5 cm abscess with surrounding erythema on the right upper labia majora. No drainage, not coming to a head.   Chaperone was present for exam.  ASSESSMENT Right vulvar abscess, 1.5 cm with some surrounding erythema    PLAN Start Bactrim DS, BID x 7 days Use warm compresses and hot soaks If not improving in 48 hours she needs to come in for further evaluation, otherwise f/u on Monday   An After Visit Summary was printed and given to the patient.

## 2017-01-26 NOTE — Patient Instructions (Addendum)
Skin Abscess A skin abscess is an infected area on or under your skin that contains a collection of pus and other material. An abscess may also be called a furuncle, carbuncle, or boil. An abscess can occur in or on almost any part of your body. Some abscesses break open (rupture) on their own. Most continue to get worse unless they are treated. The infection can spread deeper into the body and eventually into your blood, which can make you feel ill. Treatment usually involves draining the abscess. What are the causes? An abscess occurs when germs, often bacteria, pass through your skin and cause an infection. This may be caused by:  A scrape or cut on your skin.  A puncture wound through your skin, including a needle injection.  Blocked oil or sweat glands.  Blocked and infected hair follicles.  A cyst that forms beneath your skin (sebaceous cyst) and becomes infected. What increases the risk? This condition is more likely to develop in people who:  Have a weak body defense system (immune system).  Have diabetes.  Have dry and irritated skin.  Get frequent injections or use illegal IV drugs.  Have a foreign body in a wound, such as a splinter.  Have problems with their lymph system or veins. What are the signs or symptoms? An abscess may start as a painful, firm bump under the skin. Over time, the abscess may get larger or become softer. Pus may appear at the top of the abscess, causing pressure and pain. It may eventually break through the skin and drain. Other symptoms include:  Redness.  Warmth.  Swelling.  Tenderness.  A sore on the skin. How is this diagnosed? This condition is diagnosed based on your medical history and a physical exam. A sample of pus may be taken from the abscess to find out what is causing the infection and what antibiotics can be used to treat it. You also may have:  Blood tests to look for signs of infection or spread of an infection to your  blood.  Imaging studies such as ultrasound, CT scan, or MRI if the abscess is deep. How is this treated? Small abscesses that drain on their own may not need treatment. Treatment for an abscess that does not rupture on its own may include:  Warm compresses applied to the area several times per day.  Incision and drainage. Your health care provider will make an incision to open the abscess and will remove pus and any foreign body or dead tissue. The incision area may be packed with gauze to keep it open for a few days while it heals.  Antibiotic medicines to treat infection. For a severe abscess, you may first get antibiotics through an IV and then change to oral antibiotics. Follow these instructions at home: Abscess Care   If you have an abscess that has not drained, place a warm, clean, wet washcloth over the abscess several times a day. Do this as told by your health care provider.  Follow instructions from your health care provider about how to take care of your abscess. Make sure you:  Cover the abscess with a bandage (dressing).  Change your dressing or gauze as told by your health care provider.  Wash your hands with soap and water before you change the dressing or gauze. If soap and water are not available, use hand sanitizer.  Check your abscess every day for signs of a worsening infection. Check for:  More redness, swelling, or   pain.  More fluid or blood.  Warmth.  More pus or a bad smell. Medicines   Take over-the-counter and prescription medicines only as told by your health care provider.  If you were prescribed an antibiotic medicine, take it as told by your health care provider. Do not stop taking the antibiotic even if you start to feel better. General instructions   To avoid spreading the infection:  Do not share personal care items, towels, or hot tubs with others.  Avoid making skin contact with other people.  Keep all follow-up visits as told by your  health care provider. This is important. Contact a health care provider if:  You have more redness, swelling, or pain around your abscess.  You have more fluid or blood coming from your abscess.  Your abscess feels warm to the touch.  You have more pus or a bad smell coming from your abscess.  You have a fever.  You have muscle aches.  You have chills or a general ill feeling. Get help right away if:  You have severe pain.  You see red streaks on your skin spreading away from the abscess. This information is not intended to replace advice given to you by your health care provider. Make sure you discuss any questions you have with your health care provider. Document Released: 07/14/2005 Document Revised: 05/30/2016 Document Reviewed: 08/13/2015 Elsevier Interactive Patient Education  2017 [employer].   If your not improving by Friday morning, call to be seen

## 2017-01-27 ENCOUNTER — Ambulatory Visit: Payer: 59 | Admitting: Hematology and Oncology

## 2017-01-31 ENCOUNTER — Ambulatory Visit (INDEPENDENT_AMBULATORY_CARE_PROVIDER_SITE_OTHER): Payer: 59 | Admitting: Obstetrics and Gynecology

## 2017-01-31 ENCOUNTER — Encounter: Payer: Self-pay | Admitting: Obstetrics and Gynecology

## 2017-01-31 VITALS — BP 118/80 | HR 84 | Resp 16 | Wt 251.0 lb

## 2017-01-31 DIAGNOSIS — N764 Abscess of vulva: Secondary | ICD-10-CM

## 2017-01-31 NOTE — Progress Notes (Signed)
GYNECOLOGY  VISIT   HPI: 61 y.o.   Married  Caucasian  female   G0P0000 with Patient's last menstrual period was 10/18/1982.   here for follow up vulvar abscess. She was seen last week and started on  Bactrim. She is feeling much better, no longer hurting.   GYNECOLOGIC HISTORY: Patient's last menstrual period was 10/18/1982. Contraception:postmenopause  Menopausal hormone therapy: none         OB History    Gravida Para Term Preterm AB Living   0 0 0 0   0   SAB TAB Ectopic Multiple Live Births     0 0 0           Patient Active Problem List   Diagnosis Date Noted  . Atypical ductal hyperplasia of left breast 07/17/2015  . Migraine with typical aura   . Hypertension   . Hyperlipidemia   . Diabetes mellitus without complication ([place])   . History of endometriosis   . Diabetes ([place]) 06/01/2013  . Severe obesity (BMI >= 40) ([place]) 09/04/2012  . S/P breast biopsy, left 2011 [place] 06/07/2012  . Biliary dyskinesia-lap chole 2007 06/07/2012  . Obesity-BMI 49 06/07/2012    Past Medical History:  Diagnosis Date  . Blood transfusion without reported diagnosis 1983  . Diabetes mellitus without complication ([place]) 9678   T2DM  . Dyspareunia   . Exercise-induced asthma    worse in winter  . H/O bone density study 2013  . H/O cold sores   . H/O colonoscopy 2009  . History of endometriosis   . Hyperlipidemia   . Hypertension   . Injury of tendon of rotator cuff 2015   gym injury (right)  . Knee pain 08/2015   L>R  . Migraine with typical aura   . Morbid obesity ([place]) 09/04/12   BMI 49.4 kg/m^2     Past Surgical History:  Procedure Laterality Date  . BREAST BIOPSY  2010  . BREAST LUMPECTOMY  2010  . BREAST LUMPECTOMY WITH RADIOACTIVE SEED LOCALIZATION Left 07/23/2015   Procedure: LEFT BREAST SEED GUIDED EXCISION;  Surgeon: Rolm Bookbinder, MD;  Location: [facility];  Service: General;  Laterality: Left;  . BREATH TEK H PYLORI  08/28/2012   Procedure:  BREATH TEK H PYLORI;  Surgeon: Pedro Earls, MD;  Location: [facility];  Service: General;  Laterality: N/A;  . CHOLECYSTECTOMY  2007  . EXPLORATORY LAPAROTOMY  1983   endometriosis  . GANGLION CYST EXCISION Left 1999   Foot  . TOTAL ABDOMINAL HYSTERECTOMY W/ BILATERAL SALPINGOOPHORECTOMY  1984   endometriosis (and appendectomy)    Current Outpatient Prescriptions  Medication Sig Dispense Refill  . ACCU-CHEK FASTCLIX LANCETS MISC by Does not apply route.    [name] albuterol (PROVENTIL HFA;VENTOLIN HFA) 108 (90 BASE) MCG/ACT inhaler Inhale 2 puffs into the lungs every 6 (six) hours as needed for wheezing. Reported on 03/31/2016    . canagliflozin (INVOKANA) 300 MG TABS tablet Take 1 tablet (300 mg total) by mouth every morning. 90 tablet 1  . Cholecalciferol (VITAMIN D3) 2000 UNITS TABS Take 2,000 Units by mouth daily.    . cyclobenzaprine (FLEXERIL) 10 MG tablet Take 1 tablet (10 mg total) by mouth 3 (three) times daily as needed for muscle spasms. 30 tablet 0  . EPINEPHrine (EPI-PEN) 0.3 mg/0.3 mL SOAJ injection Inject 0.3 mg into the muscle once as needed. Reported on 04/30/2016    . glucose blood test strip 1 each by Other route as needed for  other. Accu Check Guide    . lisinopril (PRINIVIL,ZESTRIL) 10 MG tablet Take 1 tablet (10 mg total) by mouth daily. 90 tablet 1  . Magnesium 250 MG TABS Take 250 mg by mouth daily.    . metFORMIN (GLUCOPHAGE-XR) 500 MG 24 hr tablet Take 2 tablets (1,000 mg total) by mouth 2 (two) times daily before a meal. 360 tablet 1  . METRONIDAZOLE, TOPICAL, 0.75 % LOTN Apply 1 application topically 2 (two) times daily.    . Multiple Vitamins-Minerals (MULTIVITAMIN PO) Take 1 tablet by mouth daily. Reported on 03/31/2016    . phentermine 37.5 MG capsule Take 1/2-1 tablet every morning 90 capsule 0  . promethazine (PHENERGAN) 25 MG tablet Take 1 tablet (25 mg total) by mouth every 8 (eight) hours as needed for nausea. 30 tablet 0  . simvastatin (ZOCOR) 40 MG tablet  Take 1 tablet (40 mg total) by mouth every evening. 90 tablet 1  . sulfamethoxazole-trimethoprim (BACTRIM DS) 800-160 MG tablet Take 1 tablet by mouth 2 (two) times daily. One PO BID x 7 days 14 tablet 0  . tamoxifen (NOLVADEX) 20 MG tablet Take 1 tablet (20 mg total) by mouth daily. 90 tablet 0  . topiramate (TOPAMAX) 100 MG tablet Take 1 tablet (100 mg total) by mouth at bedtime. 90 tablet 0  . valACYclovir (VALTREX) 1000 MG tablet Take 2 tablets together at onset of cold sore.  Repeat dose once in 12 hours (full course is 4 tabs) 40 tablet 0   No current facility-administered medications for this visit.      ALLERGIES: Bee venom; Shellfish allergy; and Penicillins  Family History  Problem Relation Age of Onset  . Diabetes Father   . Heart attack Father 83  . Heart disease Father   . Hypertension Father   . Diabetes Paternal Grandfather   . Heart disease Paternal Grandfather   . Osteoporosis Maternal Grandmother   . Heart failure Maternal Grandmother   . Thyroid disease Mother     hypothyroidism  . Heart disease Mother 26    CABG at 90  . Hypertension Mother   . Diverticulitis Mother 52    perforation, required surgery  . Pneumonia Sister   . Cancer Maternal Grandfather     lung  . Dementia Paternal Grandmother   . Cancer Paternal Grandmother     melanoma on her foot (not related to death)  . Breast cancer Paternal Aunt     69's  . Cancer Maternal Aunt     lung  . Cancer Maternal Uncle     lung  . Congestive Heart Failure Maternal Uncle     Social History   Social History  . Marital status: Married    Spouse name: N/A  . Number of children: N/A  . Years of education: N/A   Occupational History  . Not on file.   Social History Main Topics  . Smoking status: Never Smoker  . Smokeless tobacco: Never Used  . Alcohol use No  . Drug use: No  . Sexual activity: No   Other Topics Concern  . Not on file   Social History Narrative   Married, no children. 1 dog  (mini daschund)   Epworth Sleepiness Scale = 1 (as of 08/13/15)    Review of Systems  Constitutional: Negative.   HENT: Negative.   Eyes: Negative.   Respiratory: Negative.   Cardiovascular: Negative.   Gastrointestinal: Negative.   Genitourinary:       Vulvar abscess  Musculoskeletal: Negative.   Skin: Negative.   Neurological: Negative.   Endo/Heme/Allergies: Negative.   Psychiatric/Behavioral: Negative.     PHYSICAL EXAMINATION:    BP 118/80 (BP Location: Right Arm, Patient Position: Sitting, Cuff Size: Normal)   Pulse 84   Resp 16   Wt 251 lb (113.9 kg)   LMP 10/18/1982   BMI 43.42 kg/m     General appearance: alert, cooperative and appears stated age  Pelvic: External genitalia:  Vulvar abscess almost completely resolved.   ASSESSMENT Vulvar abscess, improved with antibiotics.    PLAN Finish antibiotics, call with any concerns   An After Visit Summary was printed and given to the patient.

## 2017-02-03 ENCOUNTER — Other Ambulatory Visit: Payer: Self-pay | Admitting: Family Medicine

## 2017-02-03 MED ORDER — PHENTERMINE HCL 37.5 MG PO CAPS: ORAL_CAPSULE | ORAL | 0 refills | Status: DC

## 2017-02-03 MED ORDER — TOPIRAMATE 100 MG PO TABS: 100.0000 mg | ORAL_TABLET | Freq: Every day | ORAL | 0 refills | Status: DC

## 2017-02-03 MED FILL — TOPIRAMATE 100 MG TABLET: 100 | 90 days supply | Qty: 90 | Fill #0

## 2017-02-03 MED FILL — PHENTERMINE 37.5 MG TABLET: 37.5 | 90 days supply | Qty: 90 | Fill #0

## 2017-02-08 ENCOUNTER — Encounter: Payer: Self-pay | Admitting: Family Medicine

## 2017-02-08 ENCOUNTER — Other Ambulatory Visit: Payer: Self-pay

## 2017-02-08 MED ORDER — METFORMIN HCL ER 500 MG PO TB24: 1000.0000 mg | ORAL_TABLET | Freq: Two times a day (BID) | ORAL | 1 refills | Status: DC

## 2017-02-08 MED FILL — METFORMIN HCL ER 500 MG TAB: 500 | 90 days supply | Qty: 360 | Fill #0

## 2017-02-10 ENCOUNTER — Ambulatory Visit (HOSPITAL_BASED_OUTPATIENT_CLINIC_OR_DEPARTMENT_OTHER): Payer: 59 | Admitting: Hematology and Oncology

## 2017-02-10 ENCOUNTER — Encounter: Payer: Self-pay | Admitting: Hematology and Oncology

## 2017-02-10 DIAGNOSIS — N6092 Unspecified benign mammary dysplasia of left breast: Secondary | ICD-10-CM

## 2017-02-10 DIAGNOSIS — Z79810 Long term (current) use of selective estrogen receptor modulators (SERMs): Secondary | ICD-10-CM

## 2017-02-10 MED ORDER — TAMOXIFEN CITRATE 20 MG PO TABS: 20.0000 mg | ORAL_TABLET | Freq: Every day | ORAL | 3 refills | Status: DC

## 2017-02-10 NOTE — Assessment & Plan Note (Signed)
Left breast intraductal papilloma with atypical ductal hyperplasia diagnosed 07/15/2015 status post lumpectomy 07/23/2015  Prognosis: Based upon American Cancer Society breast cancer risk assessment tool, her five-year risk of breast cancer that 4.6%, and average woman's risk would be 1.9%: Her lifetime risk of breast cancer would be 25%, average risk would be 9.6%  Current treatment: risk lowering therapy with Tamoxifen 20 mg daily  Tamoxifen Toxicities:  Fogginess in the head and Lack of interest in planning on doing any activity have resolved  Breast Cancer Surveillance: 1. Breast exam 02/10/2017: Benign 2. Mammogram 07/16/2016: No mammographic evidence of malignancy   Return to clinic in 1 year for follow-up

## 2017-02-10 NOTE — Progress Notes (Signed)
Patient Care Team: Rita Ohara, MD as PCP - General (Family Medicine) Bryson Ha Himmelrich, RD (Inactive) as Dietitian (Bariatrics) Joretta Bachelor, [facility] as [place] [facility] (Pharmacist)  DIAGNOSIS:  Encounter Diagnosis  Name Primary?  . Atypical ductal hyperplasia of left breast     CHIEF COMPLIANT: Follow-up on tamoxifen therapy  INTERVAL HISTORY: Cindy Carey is a 61 year old with above-mentioned history of atypical ductal hyperplasia was currently on tamoxifen for risk reduction. She is tolerating tamoxifen extremely well. She denies any hot flashes or myalgias. She denies any pain lumps or nodules in the breast.  REVIEW OF SYSTEMS:   Constitutional: Denies fevers, chills or abnormal weight loss Eyes: Denies blurriness of vision Ears, nose, mouth, throat, and face: Denies mucositis or sore throat Respiratory: Denies cough, dyspnea or wheezes Cardiovascular: Denies palpitation, chest discomfort Gastrointestinal:  Denies nausea, heartburn or change in bowel habits Skin: Denies abnormal skin rashes Lymphatics: Denies new lymphadenopathy or easy bruising Neurological:Denies numbness, tingling or new weaknesses Behavioral/Psych: Mood is stable, no new changes  Extremities: No lower extremity edema Breast:  denies any pain or lumps or nodules in either breasts All other systems were reviewed with the patient and are negative.  I have reviewed the past medical history, past surgical history, social history and family history with the patient and they are unchanged from previous note.  ALLERGIES:  is allergic to bee venom; shellfish allergy; and penicillins.  MEDICATIONS:  Current Outpatient Prescriptions  Medication Sig Dispense Refill  . ACCU-CHEK FASTCLIX LANCETS MISC by Does not apply route.    [name] albuterol (PROVENTIL HFA;VENTOLIN HFA) 108 (90 BASE) MCG/ACT inhaler Inhale 2 puffs into the lungs every 6 (six) hours as needed for wheezing. Reported on  03/31/2016    . canagliflozin (INVOKANA) 300 MG TABS tablet Take 1 tablet (300 mg total) by mouth every morning. 90 tablet 1  . Cholecalciferol (VITAMIN D3) 2000 UNITS TABS Take 2,000 Units by mouth daily.    . cyclobenzaprine (FLEXERIL) 10 MG tablet Take 1 tablet (10 mg total) by mouth 3 (three) times daily as needed for muscle spasms. 30 tablet 0  . EPINEPHrine (EPI-PEN) 0.3 mg/0.3 mL SOAJ injection Inject 0.3 mg into the muscle once as needed. Reported on 04/30/2016    . glucose blood test strip 1 each by Other route as needed for other. Accu Check Guide    . lisinopril (PRINIVIL,ZESTRIL) 10 MG tablet Take 1 tablet (10 mg total) by mouth daily. 90 tablet 1  . Magnesium 250 MG TABS Take 250 mg by mouth daily.    . metFORMIN (GLUCOPHAGE-XR) 500 MG 24 hr tablet Take 2 tablets (1,000 mg total) by mouth 2 (two) times daily before a meal. 360 tablet 1  . METRONIDAZOLE, TOPICAL, 0.75 % LOTN Apply 1 application topically 2 (two) times daily.    . Multiple Vitamins-Minerals (MULTIVITAMIN PO) Take 1 tablet by mouth daily. Reported on 03/31/2016    . phentermine 37.5 MG capsule Take 1/2-1 tablet every morning 90 capsule 0  . promethazine (PHENERGAN) 25 MG tablet Take 1 tablet (25 mg total) by mouth every 8 (eight) hours as needed for nausea. 30 tablet 0  . simvastatin (ZOCOR) 40 MG tablet Take 1 tablet (40 mg total) by mouth every evening. 90 tablet 1  . sulfamethoxazole-trimethoprim (BACTRIM DS) 800-160 MG tablet Take 1 tablet by mouth 2 (two) times daily. One PO BID x 7 days 14 tablet 0  . tamoxifen (NOLVADEX) 20 MG tablet Take 1 tablet (20  mg total) by mouth daily. 90 tablet 0  . topiramate (TOPAMAX) 100 MG tablet Take 1 tablet (100 mg total) by mouth at bedtime. 90 tablet 0  . valACYclovir (VALTREX) 1000 MG tablet Take 2 tablets together at onset of cold sore.  Repeat dose once in 12 hours (full course is 4 tabs) 40 tablet 0   No current facility-administered medications for this visit.     PHYSICAL  EXAMINATION: ECOG PERFORMANCE STATUS: 0 - Asymptomatic  There were no vitals filed for this visit. There were no vitals filed for this visit.  GENERAL:alert, no distress and comfortable SKIN: skin color, texture, turgor are normal, no rashes or significant lesions EYES: normal, Conjunctiva are pink and non-injected, sclera clear OROPHARYNX:no exudate, no erythema and lips, buccal mucosa, and tongue normal  NECK: supple, thyroid normal size, non-tender, without nodularity LYMPH:  no palpable lymphadenopathy in the cervical, axillary or inguinal LUNGS: clear to auscultation and percussion with normal breathing effort HEART: regular rate & rhythm and no murmurs and no lower extremity edema ABDOMEN:abdomen soft, non-tender and normal bowel sounds MUSCULOSKELETAL:no cyanosis of digits and no clubbing  NEURO: alert & oriented x 3 with fluent speech, no focal motor/sensory deficits EXTREMITIES: No lower extremity edema BREAST: No palpable masses or nodules in either right or left breasts. No palpable axillary supraclavicular or infraclavicular adenopathy no breast tenderness or nipple discharge. (exam performed in the presence of a chaperone)  LABORATORY DATA:  I have reviewed the data as listed   Chemistry      Component Value Date/Time   NA 137 09/30/2016 0811   NA 139 11/21/2015 0826   K 4.6 09/30/2016 0811   K 4.7 11/21/2015 0826   CL 104 09/30/2016 0811   CO2 23 09/30/2016 0811   CO2 24 11/21/2015 0826   BUN 15 09/30/2016 0811   BUN 12.5 11/21/2015 0826   CREATININE 0.93 09/30/2016 0811   CREATININE 1.0 11/21/2015 0826      Component Value Date/Time   CALCIUM 9.3 09/30/2016 0811   CALCIUM 9.6 11/21/2015 0826   ALKPHOS 55 09/30/2016 0811   ALKPHOS 75 11/21/2015 0826   AST 37 (H) 09/30/2016 0811   AST 24 11/21/2015 0826   ALT 26 09/30/2016 0811   ALT 24 11/21/2015 0826   BILITOT 0.7 09/30/2016 0811   BILITOT 0.58 11/21/2015 0826       Lab Results  Component Value Date    WBC 7.8 09/30/2016   HGB 15.9 (H) 09/30/2016   HCT 48.0 (H) 09/30/2016   MCV 97.6 09/30/2016   PLT 123 (L) 09/30/2016   NEUTROABS 4,056 09/30/2016    ASSESSMENT & PLAN:  Atypical ductal hyperplasia of left breast Left breast intraductal papilloma with atypical ductal hyperplasia diagnosed 07/15/2015 status post lumpectomy 07/23/2015  Prognosis: Based upon American Cancer Society breast cancer risk assessment tool, her five-year risk of breast cancer that 4.6%, and average woman's risk would be 1.9%: Her lifetime risk of breast cancer would be 25%, average risk would be 9.6%  Current treatment: risk lowering therapy with Tamoxifen 20 mg daily  Tamoxifen Toxicities:  Fogginess in the head and Lack of interest in planning on doing any activity have resolved  Breast Cancer Surveillance: 1. Breast exam 02/10/2017: Benign 2. Mammogram 07/16/2016: No mammographic evidence of malignancy   Return to clinic in 1 year for follow-up   I spent 25 minutes talking to the patient of which more than half was spent in counseling and coordination of care.  No orders  of the defined types were placed in this encounter.  The patient has a good understanding of the overall plan. she agrees with it. she will call with any problems that may develop before the next visit here.   Rulon Eisenmenger, MD 02/10/17

## 2017-02-28 ENCOUNTER — Other Ambulatory Visit: Payer: Self-pay | Admitting: *Deleted

## 2017-02-28 ENCOUNTER — Encounter: Payer: Self-pay | Admitting: Family Medicine

## 2017-02-28 MED ORDER — ACCU-CHEK FASTCLIX LANCETS MISC
1.0000 | Freq: Two times a day (BID) | 3 refills | Status: DC
Start: 2017-02-28 — End: 2017-12-07

## 2017-02-28 MED ORDER — GLUCOSE BLOOD VI STRP: 1.0000 | ORAL_STRIP | Freq: Two times a day (BID) | 3 refills | Status: DC

## 2017-02-28 MED FILL — ACCU-CHEK FASTCLIX LANCETS: 51 days supply | Qty: 102 | Fill #0

## 2017-02-28 MED FILL — ACCU-CHEK GUIDE TEST STRIP: 50 days supply | Qty: 100 | Fill #0

## 2017-03-28 ENCOUNTER — Other Ambulatory Visit: Payer: Self-pay | Admitting: *Deleted

## 2017-03-28 ENCOUNTER — Encounter: Payer: Self-pay | Admitting: Family Medicine

## 2017-03-28 DIAGNOSIS — E119 Type 2 diabetes mellitus without complications: Secondary | ICD-10-CM

## 2017-03-28 DIAGNOSIS — I10 Essential (primary) hypertension: Secondary | ICD-10-CM

## 2017-03-28 DIAGNOSIS — E785 Hyperlipidemia, unspecified: Secondary | ICD-10-CM

## 2017-03-28 MED ORDER — LISINOPRIL 10 MG PO TABS: 10.0000 mg | ORAL_TABLET | Freq: Every day | ORAL | 0 refills | Status: DC

## 2017-03-28 MED ORDER — SIMVASTATIN 40 MG PO TABS: 40.0000 mg | ORAL_TABLET | Freq: Every evening | ORAL | 0 refills | Status: DC

## 2017-03-28 MED ORDER — CANAGLIFLOZIN 300 MG PO TABS: 300.0000 mg | ORAL_TABLET | ORAL | 0 refills | Status: DC

## 2017-03-28 MED FILL — INVOKANA 300 MG TABLET: 300 | 90 days supply | Qty: 90 | Fill #0

## 2017-03-28 MED FILL — SIMVASTATIN 40 MG TABLET: 40 | 90 days supply | Qty: 90 | Fill #0

## 2017-03-28 MED FILL — LISINOPRIL 10 MG TABLET: 10 | 90 days supply | Qty: 90 | Fill #0

## 2017-04-21 ENCOUNTER — Other Ambulatory Visit: Payer: Self-pay | Admitting: Obstetrics and Gynecology

## 2017-04-21 DIAGNOSIS — Z1231 Encounter for screening mammogram for malignant neoplasm of breast: Secondary | ICD-10-CM

## 2017-05-04 ENCOUNTER — Other Ambulatory Visit: Payer: Self-pay | Admitting: Family Medicine

## 2017-05-04 ENCOUNTER — Encounter: Payer: Self-pay | Admitting: Hematology and Oncology

## 2017-05-04 MED ORDER — TOPIRAMATE 100 MG PO TABS: 100.0000 mg | ORAL_TABLET | Freq: Every day | ORAL | 0 refills | Status: DC

## 2017-05-04 MED FILL — ACCU-CHEK FASTCLIX LANCETS: 51 days supply | Qty: 102 | Fill #1

## 2017-05-04 MED FILL — ACCU-CHEK GUIDE TEST STRIP: 50 days supply | Qty: 100 | Fill #1

## 2017-05-04 MED FILL — METFORMIN HCL ER 500 MG TAB: 500 | 90 days supply | Qty: 360 | Fill #1

## 2017-05-04 MED FILL — TOPIRAMATE 100 MG TABLET: 100 | 90 days supply | Qty: 90 | Fill #0

## 2017-05-05 ENCOUNTER — Other Ambulatory Visit: Payer: Self-pay

## 2017-05-10 MED FILL — TAMOXIFEN CITRATE 20 MG TAB: 20 | 90 days supply | Qty: 90 | Fill #0

## 2017-05-19 ENCOUNTER — Encounter: Payer: 59 | Admitting: Family Medicine

## 2017-05-28 ENCOUNTER — Telehealth: Payer: Self-pay

## 2017-05-28 NOTE — Telephone Encounter (Signed)
Spoke with patient and she is aware of her new appt due to dr Nadara Eaton out of office  Cindy Carey

## 2017-06-21 ENCOUNTER — Other Ambulatory Visit: Payer: Self-pay | Admitting: Family Medicine

## 2017-06-21 DIAGNOSIS — E785 Hyperlipidemia, unspecified: Secondary | ICD-10-CM

## 2017-06-21 DIAGNOSIS — I10 Essential (primary) hypertension: Secondary | ICD-10-CM

## 2017-06-21 DIAGNOSIS — E119 Type 2 diabetes mellitus without complications: Secondary | ICD-10-CM

## 2017-06-21 MED ORDER — SIMVASTATIN 40 MG PO TABS: 40.0000 mg | ORAL_TABLET | Freq: Every evening | ORAL | 0 refills | Status: DC

## 2017-06-21 MED ORDER — CANAGLIFLOZIN 300 MG PO TABS: 300.0000 mg | ORAL_TABLET | ORAL | 0 refills | Status: DC

## 2017-06-21 MED ORDER — LISINOPRIL 10 MG PO TABS: 10.0000 mg | ORAL_TABLET | Freq: Every day | ORAL | 0 refills | Status: DC

## 2017-06-21 MED FILL — SIMVASTATIN 40 MG TABLET: 40 | 90 days supply | Qty: 90 | Fill #0

## 2017-06-21 MED FILL — LISINOPRIL 10 MG TABLET: 10 | 90 days supply | Qty: 90 | Fill #0

## 2017-06-21 MED FILL — INVOKANA 300 MG TABLET: 300 | 90 days supply | Qty: 90 | Fill #0

## 2017-07-05 MED FILL — ACCU-CHEK GUIDE TEST STRIP: 50 days supply | Qty: 100 | Fill #2

## 2017-07-05 MED FILL — ACCU-CHEK FASTCLIX LANCETS: 51 days supply | Qty: 102 | Fill #2

## 2017-07-18 ENCOUNTER — Ambulatory Visit
Admission: RE | Admit: 2017-07-18 | Discharge: 2017-07-18 | Disposition: A | Discharge status: Home / Self Care | Payer: 59 | Source: Ambulatory Visit | Attending: Obstetrics and Gynecology | Admitting: Obstetrics and Gynecology

## 2017-07-18 DIAGNOSIS — Z1231 Encounter for screening mammogram for malignant neoplasm of breast: Secondary | ICD-10-CM | POA: Diagnosis not present

## 2017-07-20 ENCOUNTER — Ambulatory Visit: Payer: 59 | Admitting: Obstetrics and Gynecology

## 2017-08-01 ENCOUNTER — Other Ambulatory Visit: Payer: Self-pay | Admitting: Family Medicine

## 2017-08-01 ENCOUNTER — Encounter: Payer: Self-pay | Admitting: Family Medicine

## 2017-08-01 MED ORDER — METFORMIN HCL ER 500 MG PO TB24: 1000.0000 mg | ORAL_TABLET | Freq: Two times a day (BID) | ORAL | 0 refills | Status: DC

## 2017-08-01 MED ORDER — CYCLOBENZAPRINE HCL 10 MG PO TABS: 10.0000 mg | ORAL_TABLET | Freq: Three times a day (TID) | ORAL | 0 refills | Status: DC | PRN

## 2017-08-01 MED FILL — TAMOXIFEN CITRATE 20 MG TAB: 20 | 90 days supply | Qty: 90 | Fill #1

## 2017-08-01 MED FILL — METFORMIN HCL ER 500 MG TAB: 500 | 90 days supply | Qty: 360 | Fill #0

## 2017-08-02 MED FILL — CYCLOBENZAPRINE 10 MG TAB: 10 | 10 days supply | Qty: 30 | Fill #0

## 2017-09-19 ENCOUNTER — Other Ambulatory Visit: Payer: Self-pay | Admitting: Family Medicine

## 2017-09-19 DIAGNOSIS — I10 Essential (primary) hypertension: Secondary | ICD-10-CM

## 2017-09-19 DIAGNOSIS — E785 Hyperlipidemia, unspecified: Secondary | ICD-10-CM

## 2017-09-19 MED ORDER — SIMVASTATIN 40 MG PO TABS: 40.0000 mg | ORAL_TABLET | Freq: Every evening | ORAL | 0 refills | Status: DC

## 2017-09-19 MED ORDER — LISINOPRIL 10 MG PO TABS: 10.0000 mg | ORAL_TABLET | Freq: Every day | ORAL | 0 refills | Status: DC

## 2017-09-19 MED FILL — ACCU-CHEK GUIDE TEST STRIP: 50 days supply | Qty: 100 | Fill #3

## 2017-09-19 MED FILL — LISINOPRIL 10 MG TABS: 10 | 90 days supply | Qty: 90 | Fill #0

## 2017-09-19 MED FILL — SIMVASTATIN 40 MG TABLET: 40 | 90 days supply | Qty: 90 | Fill #0

## 2017-09-19 MED FILL — ACCU-CHEK FASTCLIX LANCETS: 51 days supply | Qty: 102 | Fill #3

## 2017-09-21 ENCOUNTER — Ambulatory Visit (INDEPENDENT_AMBULATORY_CARE_PROVIDER_SITE_OTHER): Payer: 59 | Admitting: Obstetrics and Gynecology

## 2017-09-21 ENCOUNTER — Encounter: Payer: Self-pay | Admitting: Obstetrics and Gynecology

## 2017-09-21 ENCOUNTER — Other Ambulatory Visit: Payer: Self-pay

## 2017-09-21 VITALS — BP 132/80 | HR 94 | Resp 18 | Ht 63.0 in | Wt 255.0 lb

## 2017-09-21 DIAGNOSIS — Z01419 Encounter for gynecological examination (general) (routine) without abnormal findings: Secondary | ICD-10-CM

## 2017-09-21 DIAGNOSIS — B372 Candidiasis of skin and nail: Secondary | ICD-10-CM | POA: Diagnosis not present

## 2017-09-21 DIAGNOSIS — M8580 Other specified disorders of bone density and structure, unspecified site: Secondary | ICD-10-CM

## 2017-09-21 DIAGNOSIS — N763 Subacute and chronic vulvitis: Secondary | ICD-10-CM

## 2017-09-21 MED ORDER — NYSTATIN 100000 UNIT/GM EX OINT
1.0000 | TOPICAL_OINTMENT | Freq: Two times a day (BID) | CUTANEOUS | 0 refills | Status: DC
Start: 2017-09-21 — End: 2017-12-07

## 2017-09-21 MED ORDER — NYSTATIN 100000 UNIT/GM EX CREA: 1.0000 "application " | TOPICAL_CREAM | Freq: Two times a day (BID) | CUTANEOUS | 0 refills | Status: DC

## 2017-09-21 MED ORDER — BETAMETHASONE VALERATE 0.1 % EX OINT: TOPICAL_OINTMENT | CUTANEOUS | 0 refills | Status: DC

## 2017-09-21 MED ORDER — HYDROXYZINE HCL 10 MG PO TABS: 10.0000 mg | ORAL_TABLET | Freq: Three times a day (TID) | ORAL | 0 refills | Status: DC | PRN

## 2017-09-21 MED FILL — NYSTATIN 100,000 UNITS/GM O: 100000 | 7 days supply | Qty: 30 | Fill #0

## 2017-09-21 MED FILL — NYSTATIN 100,000 UNIT/GM CR: 100000 | 7 days supply | Qty: 30 | Fill #0

## 2017-09-21 MED FILL — hydrOXYzine HCL 10 MG TABS: 10 | 10 days supply | Qty: 30 | Fill #0

## 2017-09-21 NOTE — Patient Instructions (Signed)

## 2017-09-21 NOTE — Progress Notes (Signed)
61 y.o. G0P0000 MarriedCaucasianF here for annual exam.  H/O TAH/BSO.  Long term issues with intermittent vulvar pruritus and irritation. Vaseline helps if she uses it prior to getting too irritated. She uses steroid ointment every few months. She always has some level of irritation.  No vaginal bleeding, not sexually active.  She has some GSI, a small amount every day. She wears a liner. Uses vaseline 2 x a day.  She does get some irritation under her panus and in her groin intermittently.      Patient's last menstrual period was 10/18/1982.          Sexually active: No.  The current method of family planning is status post hysterectomy.    Exercising: Yes.    gym 3 x weekly Smoker:  no  Health Maintenance: Pap:  2011 History of abnormal Pap:  no MMG:  07/18/17 BIRADS1:Neg  Colonoscopy:  2009 f/u 10 years  BMD:   08/24/12 Osteopenia  TDaP:  2016   reports that  has never smoked. she has never used smokeless tobacco. She reports that she does not drink alcohol or use drugs.She is a Pediatric NP  Past Medical History:  Diagnosis Date  . Blood transfusion without reported diagnosis 1983  . Diabetes mellitus without complication ([place]) 4854   T2DM  . Dyspareunia   . Exercise-induced asthma    worse in winter  . H/O bone density study 2013  . H/O cold sores   . H/O colonoscopy 2009  . History of endometriosis   . Hyperlipidemia   . Hypertension   . Injury of tendon of rotator cuff 2015   gym injury (right)  . Knee pain 08/2015   L>R  . Migraine with typical aura   . Morbid obesity ([place]) 09/04/12   BMI 49.4 kg/m^2     Past Surgical History:  Procedure Laterality Date  . BREAST BIOPSY  2010  . BREAST LUMPECTOMY  2010  . BREAST LUMPECTOMY WITH RADIOACTIVE SEED LOCALIZATION Left 07/23/2015   Procedure: LEFT BREAST SEED GUIDED EXCISION;  Surgeon: Rolm Bookbinder, MD;  Location: [facility];  Service: General;  Laterality: Left;  . BREATH TEK H PYLORI   08/28/2012   Procedure: BREATH TEK H PYLORI;  Surgeon: Pedro Earls, MD;  Location: [facility];  Service: General;  Laterality: N/A;  . CHOLECYSTECTOMY  2007  . EXPLORATORY LAPAROTOMY  1983   endometriosis  . GANGLION CYST EXCISION Left 1999   Foot  . TOTAL ABDOMINAL HYSTERECTOMY W/ BILATERAL SALPINGOOPHORECTOMY  1984   endometriosis (and appendectomy)    Current Outpatient Medications  Medication Sig Dispense Refill  . ACCU-CHEK FASTCLIX LANCETS MISC 1 each by Does not apply route 2 (two) times daily. 102 each 3  . albuterol (PROVENTIL HFA;VENTOLIN HFA) 108 (90 BASE) MCG/ACT inhaler Inhale 2 puffs into the lungs every 6 (six) hours as needed for wheezing. Reported on 03/31/2016    . canagliflozin (INVOKANA) 300 MG TABS tablet Take 1 tablet (300 mg total) by mouth every morning. 90 tablet 0  . Cholecalciferol (VITAMIN D3) 2000 UNITS TABS Take 2,000 Units by mouth daily.    . cyclobenzaprine (FLEXERIL) 10 MG tablet Take 1 tablet (10 mg total) by mouth 3 (three) times daily as needed for muscle spasms. 30 tablet 0  . EPINEPHrine (EPI-PEN) 0.3 mg/0.3 mL SOAJ injection Inject 0.3 mg into the muscle once as needed. Reported on 04/30/2016    . glucose blood test strip 1 each by Other route 2 (two)  times daily. Accu Check Guide 100 each 3  . lisinopril (PRINIVIL,ZESTRIL) 10 MG tablet Take 1 tablet (10 mg total) by mouth daily. 90 tablet 0  . Magnesium 250 MG TABS Take 250 mg by mouth daily.    . metFORMIN (GLUCOPHAGE-XR) 500 MG 24 hr tablet Take 2 tablets (1,000 mg total) by mouth 2 (two) times daily before a meal. 360 tablet 0  . METRONIDAZOLE, TOPICAL, 0.75 % LOTN Apply 1 application topically 2 (two) times daily.    . Multiple Vitamins-Minerals (MULTIVITAMIN PO) Take 1 tablet by mouth daily. Reported on 03/31/2016    . promethazine (PHENERGAN) 25 MG tablet Take 1 tablet (25 mg total) by mouth every 8 (eight) hours as needed for nausea. 30 tablet 0  . simvastatin (ZOCOR) 40 MG tablet Take 1  tablet (40 mg total) by mouth every evening. 90 tablet 0  . tamoxifen (NOLVADEX) 20 MG tablet Take 1 tablet (20 mg total) by mouth daily. 90 tablet 3  . valACYclovir (VALTREX) 1000 MG tablet Take 2 tablets together at onset of cold sore.  Repeat dose once in 12 hours (full course is 4 tabs) 40 tablet 0   No current facility-administered medications for this visit.     Family History  Problem Relation Age of Onset  . Diabetes Father   . Heart attack Father 43  . Heart disease Father   . Hypertension Father   . Diabetes Paternal Grandfather   . Heart disease Paternal Grandfather   . Osteoporosis Maternal Grandmother   . Heart failure Maternal Grandmother   . Thyroid disease Mother        hypothyroidism  . Heart disease Mother 58       CABG at 51  . Hypertension Mother   . Diverticulitis Mother 5       perforation, required surgery  . Pneumonia Sister   . Cancer Maternal Grandfather        lung  . Dementia Paternal Grandmother   . Cancer Paternal Grandmother        melanoma on her foot (not related to death)  . Breast cancer Paternal Aunt        28's  . Cancer Maternal Aunt        lung  . Cancer Maternal Uncle        lung  . Breast cancer Cousin   . Congestive Heart Failure Maternal Uncle   . Breast cancer Cousin     Review of Systems  Exam:   BP 132/80 (BP Location: Right Arm, Patient Position: Sitting, Cuff Size: Large)   Pulse 94   Resp 18   Ht 5\' 3"  (1.6 m)   Wt 255 lb (115.7 kg)   LMP 10/18/1982   BMI 45.17 kg/m   Weight change: @WEIGHTCHANGE @ Height:   Height: 5\' 3"  (160 cm)  Ht Readings from Last 3 Encounters:  09/21/17 5\' 3"  (1.6 m)  02/10/17 5' 3.75" (1.619 m)  01/26/17 5' 3.75" (1.619 m)    General appearance: alert, cooperative and appears stated age Head: Normocephalic, without obvious abnormality, atraumatic Neck: no adenopathy, supple, symmetrical, trachea midline and thyroid normal to inspection and palpation Lungs: clear to auscultation  bilaterally Cardiovascular: regular rate and rhythm Breasts: normal appearance, no masses or tenderness Abdomen: soft, non-tender; non distended,  no masses,  no organomegaly Extremities: extremities normal, atraumatic, no cyanosis or edema Skin: Skin color, texture, turgor normal. Under her panus and in her right groin, there is a mild erythematous, patchy rash, c/w  candida intertrigo. Lymph nodes: Cervical, supraclavicular, and axillary nodes normal. No abnormal inguinal nodes palpated Neurologic: Grossly normal   Pelvic: External genitalia:  Excoriated, erythema, some whitening, some loss of architecture. No lesions, no plaques, no fissures. Looks like a skin yeast infection              Urethra:  normal appearing urethra with no masses, tenderness or lesions              Bartholins and Skenes: normal                 Vagina: normal appearing vagina with normal color and discharge, no lesions              Cervix: absent               Bimanual Exam:  Uterus:  uterus absent              Adnexa: no mass, fullness, tenderness               Rectovaginal: Confirms               Anus:  normal sphincter tone, no lesions  Chaperone was present for exam.  A:  Well Woman with normal exam  Candida intertrigo  Chronic vulvitis, rash looks suspicious for candida, no vaginal d/c or erythema  P:   Due for DEXA  Discussed breast self exam  Discussed calcium and vit D intake  Mammogram is UTD  Colonoscopy due  Affirm  Treat the vulva with steroid ointment and nystatin ointment  Nystatin cream for candida intertrigo in her groin and under her panus  F/U in 2 weeks if her vulva is not better

## 2017-09-23 ENCOUNTER — Other Ambulatory Visit (HOSPITAL_COMMUNITY): Payer: Self-pay | Admitting: General Surgery

## 2017-09-23 ENCOUNTER — Encounter: Payer: Self-pay | Admitting: Obstetrics and Gynecology

## 2017-09-23 DIAGNOSIS — E119 Type 2 diabetes mellitus without complications: Secondary | ICD-10-CM | POA: Diagnosis not present

## 2017-09-23 LAB — VAGINITIS/VAGINOSIS, DNA PROBE
CANDIDA SPECIES: NEGATIVE
GARDNERELLA VAGINALIS: NEGATIVE
Trichomonas vaginosis: NEGATIVE

## 2017-09-27 ENCOUNTER — Telehealth: Payer: Self-pay

## 2017-09-27 NOTE — Telephone Encounter (Signed)
Please see telephone encounter created 09/27/2017.

## 2017-09-27 NOTE — Telephone Encounter (Signed)
Visit Follow-Up Question  Message [id number]  From [name], [name] To [name], MD Sent 09/23/2017 5:20 PM  Thank you for sending the lab results. I tried the Nystatin ointment Weds night and then tried the Atarax Thursday night. I am not sure that the Nystatin ointment helped as much as the steroid ointment does overall, but the Atarax made a huge difference. I did not wake up feeling sore and irritated as I usually do and I have not experienced itching today (as I usually do at some point during the day). I am not sure when I should take it again - such as only if I have severe itching or if I should take it regularly at bedtime for awhile?   The Nystatin cream has made a big difference on the skin fold infection already, so I think the cream for that area is much more helpful than the Lotrimin that I was using.   Thank you,  Cindy Carey   Responsible Party   Pool - [facility] Clinical Pool No one has taken responsibility for this message.  No actions have been taken on this message.   Routing to [place] for review and advise.

## 2017-09-27 NOTE — Telephone Encounter (Signed)
Left message to call Kaitlyn at [phone]. 

## 2017-09-27 NOTE — Telephone Encounter (Signed)
I would just have her use the Atarax if she needs it. I would continue to use the nystatin for a full week and use the steroid ointment if needed for up to 2 weeks. She should f/u in another week if she isn't feeling completely better.

## 2017-09-28 NOTE — Telephone Encounter (Signed)
Spoke with patient. Advised of message as seen below from Dr.Jertson. Patient verbalizes understanding. Encounter closed. 

## 2017-09-30 ENCOUNTER — Ambulatory Visit
Admission: RE | Admit: 2017-09-30 | Discharge: 2017-09-30 | Disposition: A | Discharge status: Home / Self Care | Payer: 59 | Source: Ambulatory Visit | Attending: Obstetrics and Gynecology | Admitting: Obstetrics and Gynecology

## 2017-09-30 DIAGNOSIS — M85852 Other specified disorders of bone density and structure, left thigh: Secondary | ICD-10-CM | POA: Diagnosis not present

## 2017-09-30 DIAGNOSIS — Z780 Asymptomatic menopausal state: Secondary | ICD-10-CM | POA: Diagnosis not present

## 2017-09-30 DIAGNOSIS — M8580 Other specified disorders of bone density and structure, unspecified site: Secondary | ICD-10-CM

## 2017-10-03 ENCOUNTER — Encounter: Payer: Self-pay | Admitting: Skilled Nursing Facility1

## 2017-10-03 ENCOUNTER — Other Ambulatory Visit: Payer: Self-pay

## 2017-10-03 ENCOUNTER — Encounter: Payer: 59 | Attending: General Surgery | Admitting: Skilled Nursing Facility1

## 2017-10-03 ENCOUNTER — Other Ambulatory Visit: Payer: Self-pay | Admitting: Obstetrics and Gynecology

## 2017-10-03 ENCOUNTER — Encounter: Payer: Self-pay | Admitting: Family Medicine

## 2017-10-03 DIAGNOSIS — I10 Essential (primary) hypertension: Secondary | ICD-10-CM | POA: Diagnosis not present

## 2017-10-03 DIAGNOSIS — Z79899 Other long term (current) drug therapy: Secondary | ICD-10-CM | POA: Insufficient documentation

## 2017-10-03 DIAGNOSIS — Z713 Dietary counseling and surveillance: Secondary | ICD-10-CM | POA: Diagnosis not present

## 2017-10-03 DIAGNOSIS — E119 Type 2 diabetes mellitus without complications: Secondary | ICD-10-CM | POA: Diagnosis not present

## 2017-10-03 MED ORDER — BETAMETHASONE VALERATE 0.1 % EX OINT: 1.0000 "application " | TOPICAL_OINTMENT | Freq: Two times a day (BID) | CUTANEOUS | 0 refills | Status: DC

## 2017-10-03 MED FILL — BETAMETHASONE VALER 0.1% OI: 0.1 | 20 days supply | Qty: 30 | Fill #0

## 2017-10-03 NOTE — Progress Notes (Signed)
Pre-Op Assessment Visit:  Pre-Operative Sleeve Gastrectomy Surgery  Medical Nutrition Therapy:  Appt start time: 7:32 End time:  8:30 Patient was seen on 10/03/2017 for Pre-Operative Nutrition Assessment. Assessment and letter of approval faxed to [facility] Bariatric Surgery Program coordinator on 10/03/2017.   Pt states she 3 days a week at the [employer] 30 minutes. Pt states she is not a meat eater. Pt states she was diagnosed with diabetes in 2010; A1C 6.1 and does the wellsmith program: 90-100's.   Pt expectation of surgery: useful in getting where I want to be, I want to be able to do things I wan to do   Pt expectation of Dietitian: I do not have one   Start weight at [facility]: 258.7  BMI: 46.41  24 hr Dietary Recall: eating more on Saturday and homemade bread First Meal: oatmeal or egg  Snack:  Second Meal: [place] sandwich with cheese and baby carrots and greek yogurt or sugar free jello or spaghetti and meatballs  Snack: Third Meal: sandwich or Progresso soup or grilled chicken with vegetables  Snack:  Beverages: water, 1/4 cup coffee, diet soda   Encouraged to engage in 150 minutes of moderate physical activity including cardiovascular and weight baring weekly  Handouts given during visit include:  . Pre-Op Goals . Bariatric Surgery Protein Shakes During the appointment today the following Pre-Op Goals were reviewed with the patient: . Maintain or lose weight as instructed by your surgeon . Make healthy food choices . Begin to limit portion sizes . Limited concentrated sugars and fried foods . Keep fat/sugar in the single digits per serving on             food labels . Practice CHEWING your food  (aim for 30 chews per bite or until applesauce consistency) . Practice not drinking 15 minutes before, during, and 30 minutes after each meal/snack . Avoid all carbonated beverages  . Avoid/limit caffeinated beverages  . Avoid all sugar-sweetened beverages . Consume 3  meals per day; eat every 3-5 hours . Make a list of non-food related activities . Aim for 64-100 ounces of FLUID daily  . Aim for at least 60-80 grams of PROTEIN daily . Look for a liquid protein source that contain ?15 g protein and ?5 g carbohydrate  (ex: shakes, drinks, shots)  -Follow diet recommendations listed below   Energy and Macronutrient Recomendations: Calories: 1500 Carbohydrate: 170 Protein: 112 Fat: 42  Demonstrated degree of understanding via:  Teach Back  Teaching Method Utilized:  Visual Auditory Hands on  Barriers to learning/adherence to lifestyle change:none stated   Patient to call the [facility] to enroll in Pre-Op and Post-Op Nutrition Education when surgery date is scheduled.

## 2017-10-03 NOTE — Telephone Encounter (Signed)
Cindy Carey would like a refill of the following medications:    betamethasone valerate ointment (VALISONE) 0.1 % [name], MD]   Patient Comment: I just want to have one on hand and want to get it while I have FSA money to spend before the end of this year.   Preferred pharmacy: [facility], [place] - [address].        Medication refill request: Valisone 0.1% Last AEX:  09-21-17 Next AEX: 09-25-18 Last MMG (if hormonal medication request): 07-18-17 Refill authorized: please approve if appropriate

## 2017-10-04 NOTE — Telephone Encounter (Signed)
Pt aware rx was sent to her pharmacy.

## 2017-10-05 DIAGNOSIS — H524 Presbyopia: Secondary | ICD-10-CM | POA: Diagnosis not present

## 2017-10-05 LAB — HM DIABETES EYE EXAM

## 2017-10-14 ENCOUNTER — Encounter (HOSPITAL_COMMUNITY): Payer: Self-pay

## 2017-10-14 ENCOUNTER — Other Ambulatory Visit (HOSPITAL_COMMUNITY): Payer: 59

## 2017-10-18 DIAGNOSIS — M722 Plantar fascial fibromatosis: Secondary | ICD-10-CM

## 2017-10-18 HISTORY — DX: Plantar fascial fibromatosis: M72.2

## 2017-10-24 ENCOUNTER — Encounter: Payer: Self-pay | Admitting: *Deleted

## 2017-10-27 ENCOUNTER — Other Ambulatory Visit: Payer: Self-pay | Admitting: Family Medicine

## 2017-10-27 MED ORDER — METFORMIN HCL ER 500 MG PO TB24: 1000.0000 mg | ORAL_TABLET | Freq: Two times a day (BID) | ORAL | 0 refills | Status: DC

## 2017-10-27 MED FILL — METFORMIN HCL ER 500 MG TAB: 500 | 90 days supply | Qty: 360 | Fill #0

## 2017-10-27 MED FILL — TAMOXIFEN CITRATE 20 MG TAB: 20 | 90 days supply | Qty: 90 | Fill #2

## 2017-10-31 ENCOUNTER — Ambulatory Visit (HOSPITAL_COMMUNITY): Payer: 59

## 2017-10-31 ENCOUNTER — Ambulatory Visit: Payer: 59 | Admitting: Skilled Nursing Facility1

## 2017-11-04 ENCOUNTER — Encounter: Payer: Self-pay | Admitting: Family Medicine

## 2017-11-11 ENCOUNTER — Other Ambulatory Visit: Payer: Self-pay

## 2017-11-11 ENCOUNTER — Ambulatory Visit (HOSPITAL_COMMUNITY)
Admission: RE | Admit: 2017-11-11 | Discharge: 2017-11-11 | Disposition: A | Discharge status: Home / Self Care | Payer: No Typology Code available for payment source | Source: Ambulatory Visit | Attending: General Surgery | Admitting: General Surgery

## 2017-11-11 DIAGNOSIS — K449 Diaphragmatic hernia without obstruction or gangrene: Secondary | ICD-10-CM | POA: Diagnosis not present

## 2017-11-11 DIAGNOSIS — R918 Other nonspecific abnormal finding of lung field: Secondary | ICD-10-CM | POA: Diagnosis not present

## 2017-11-11 DIAGNOSIS — Z01818 Encounter for other preprocedural examination: Secondary | ICD-10-CM | POA: Insufficient documentation

## 2017-11-11 DIAGNOSIS — R9431 Abnormal electrocardiogram [ECG] [EKG]: Secondary | ICD-10-CM | POA: Diagnosis not present

## 2017-11-11 DIAGNOSIS — Z01810 Encounter for preprocedural cardiovascular examination: Secondary | ICD-10-CM | POA: Insufficient documentation

## 2017-11-14 ENCOUNTER — Encounter: Payer: No Typology Code available for payment source | Attending: General Surgery | Admitting: Registered"

## 2017-11-14 DIAGNOSIS — I10 Essential (primary) hypertension: Secondary | ICD-10-CM | POA: Insufficient documentation

## 2017-11-14 DIAGNOSIS — Z79899 Other long term (current) drug therapy: Secondary | ICD-10-CM | POA: Diagnosis not present

## 2017-11-14 DIAGNOSIS — E119 Type 2 diabetes mellitus without complications: Secondary | ICD-10-CM | POA: Diagnosis not present

## 2017-11-14 DIAGNOSIS — Z713 Dietary counseling and surveillance: Secondary | ICD-10-CM | POA: Insufficient documentation

## 2017-11-14 NOTE — Progress Notes (Signed)
  Pre-Operative Nutrition Class:  Appt start time: 8:15   End time: 9:15.  Patient was seen on 11/14/2017 for Pre-Operative Bariatric Surgery Education at [facility].   Surgery date: TBD Surgery type: Sleeve Start weight at [facility]: 258.7 Weight today: 257.3  Samples given per MNT protocol. Patient educated on appropriate usage: Bariatric Advantage Multivitamin Lot # [id number] Exp: 04/2018  Bariatric Advantage Calcium Citrate (strawberry) Lot # [id number] Exp: 07/05/2018  Bariatric Advantage Calcium Citrate (tropical orange) Lot # [id number] Exp: 07/05/2018  Unjury Protein Powder Lot # [id number] Exp: 08/04/2018  The following the learning objectives were met by the patient during this course:  Identify Pre-Op Dietary Goals and will begin 2 weeks pre-operatively  Identify appropriate sources of fluids and proteins   State protein recommendations and appropriate sources pre and post-operatively  Identify Post-Operative Dietary Goals and will follow for 2 weeks post-operatively  Identify appropriate multivitamin and calcium sources  Describe the need for physical activity post-operatively and will follow MD recommendations  State when to call healthcare provider regarding medication questions or post-operative complications  Handouts given during class include:  Pre-Op Bariatric Surgery Diet Handout  Protein Shake Handout  Post-Op Bariatric Surgery Nutrition Handout  [facility] Program Information Flyer  Support Group Information Flyer  [facility] Bariatric Supplements Price List  Follow-Up Plan: Patient will follow-up at [facility] 2 weeks post operatively for diet advancement per MD.

## 2017-11-24 ENCOUNTER — Ambulatory Visit: Payer: No Typology Code available for payment source | Admitting: Psychiatry

## 2017-11-24 DIAGNOSIS — F509 Eating disorder, unspecified: Secondary | ICD-10-CM | POA: Diagnosis not present

## 2017-12-06 NOTE — Progress Notes (Addendum)
Chief Complaint  Patient presents with  . Annual Exam    fasting annual exam (cereal at 6am) no pap-sees Dr.Jertzen. Had eye exam in Dec with Dr. Claudean Kinds. Would like to discuss weightloss options. And Invokana is no longer covered, she has list of covered alternatives.     Cindy Carey is a 62 y.o. female who presents for a complete physical.  She has the following concerns:  Diabetes follow-up:  Sugars have been running 80-110 in the morning. 6pm usually 100-110.  Max 141, after eating Her last A1c was 6.1% in March (here).  She is in the [facility]/[employer] program. Anastasio Auerbach is no longer covered, needs to be changed. She has some chronic vaginal itching (treated by GYN), she wonders if could be a side effect of the Invokana. She has also been seeing nutritionist (part of pre-op program for bariatric surgery).  Scheduled to see psychologist. Won't know cost/coverage until they determine if she is a candidate.  Had UGI, CXR, EKG earlier this year as part of work-up. She reports small HH. Denies heartburn, but has hoarseness by the end of the day. Hoarseness is noticed progressively throughout the day in the last 3-4 months. No sore throat, dysphagia. She has some sensitivity to perfumes. Does some home visits which can cause runny nose, runny eyes.  She brings in lab slip for bloodwork they need.  Denies polydipsia and polyuria. Eye exam UTD, 09/2017, no retinopathy. Patient follows a low sugar diet and checks feet regularly without concerns.  Hypertension follow-up: Blood pressures are 110/70 (checks when she visits her parents and at work).   Denies dizziness, headaches, chest pain. Denies side effects of medications. Some leg cramps.  Migraines--(has aura, green fuzzy vision change). One every 4-6 months, mild. Has usedibuprofen or aleve with good results for milder headaches, sometimes requires phenergan also. She tapered off the topamax at least 3-4 months  ago--she had significant hair thinning, which is improving since being off the  Medication. She hasn't noticed any increased headache frequency since being off it.   Hyperlipidemia follow-up: Patient is reportedly following a low-fat, low cholesterol diet. Compliant with medications and denies medication side effects. Due for recheck, last labs 09/2016 Lab Results  Component Value Date   CHOL 124 09/30/2016   HDL 55 09/30/2016   LDLCALC 43 09/30/2016   TRIG 128 09/30/2016   CHOLHDL 2.3 09/30/2016   Left breast intraductal papilloma with atypical ductal hyperplasia diagnosed 07/15/2015; status post lumpectomy 07/23/2015. Due to lifetime risk of breast cancer of 25%, she is on risk lowering therapy with Tamoxifen.  She saw Dr. Lindi Adie last in 01/2017. She is tolerating tamoxifen well, without hot flashes or myalgias.   Immunization History  Administered Date(s) Administered  . Influenza-Unspecified 06/27/2015  . Pneumococcal Polysaccharide-23 03/31/2016  . Tdap 07/03/2015   Gets flu shots yearly at work Last Pap smear:  per GYN, s/p hysterectomy Last mammogram: 07/2017 Last colonoscopy: 12/17/07 Dr. Collene Mares Last DEXA: 09/2017 T-1.3 at L fem neck Dentist: twice yearly Ophtho: yearly, Dr. Claudean Kinds Exercise: [employer] 3-4x/week (30 mins cardio, 20 mins weights), walks/hikes at least an hour on the weekends (in [place]) Vitamin D-OH 41 in 03/2016  Past Medical History:  Diagnosis Date  . Blood transfusion without reported diagnosis 1983  . Diabetes mellitus without complication ([place]) 3903   T2DM  . Dyspareunia   . Exercise-induced asthma    worse in winter  . H/O bone density study 2013  . H/O cold sores   .  H/O colonoscopy 2009  . History of endometriosis   . Hyperlipidemia   . Hypertension   . Injury of tendon of rotator cuff 2015   gym injury (right)  . Knee pain 08/2015   L>R  . Migraine with typical aura   . Morbid obesity ([place]) 09/04/12   BMI 49.4 kg/m^2     Past  Surgical History:  Procedure Laterality Date  . BREAST BIOPSY  2010  . BREAST LUMPECTOMY  2010  . BREAST LUMPECTOMY WITH RADIOACTIVE SEED LOCALIZATION Left 07/23/2015   Procedure: LEFT BREAST SEED GUIDED EXCISION;  Surgeon: Rolm Bookbinder, MD;  Location: [facility];  Service: General;  Laterality: Left;  . BREATH TEK H PYLORI  08/28/2012   Procedure: BREATH TEK H PYLORI;  Surgeon: Pedro Earls, MD;  Location: [facility];  Service: General;  Laterality: N/A;  . CHOLECYSTECTOMY  2007  . EXPLORATORY LAPAROTOMY  1983   endometriosis  . GANGLION CYST EXCISION Left 1999   Foot  . TOTAL ABDOMINAL HYSTERECTOMY W/ BILATERAL SALPINGOOPHORECTOMY  1984   endometriosis (and appendectomy)    Social History   Socioeconomic History  . Marital status: Married    Spouse name: Not on file  . Number of children: Not on file  . Years of education: Not on file  . Highest education level: Not on file  Social Needs  . Financial resource strain: Not on file  . Food insecurity - worry: Not on file  . Food insecurity - inability: Not on file  . Transportation needs - medical: Not on file  . Transportation needs - non-medical: Not on file  Occupational History  . Not on file  Tobacco Use  . Smoking status: Never Smoker  . Smokeless tobacco: Never Used  Substance and Sexual Activity  . Alcohol use: No  . Drug use: No  . Sexual activity: No    Partners: Male    Birth control/protection: Surgical  Other Topics Concern  . Not on file  Social History Narrative   Married, no children. 1 dog (mini daschund)   Epworth Sleepiness Scale = 1 (as of 08/13/15)    Family History  Problem Relation Age of Onset  . Diabetes Father   . Heart attack Father 57  . Heart disease Father   . Hypertension Father   . Peripheral Artery Disease Father        stents at 41  . Diabetes Paternal Grandfather   . Heart disease Paternal Grandfather   . Osteoporosis Maternal Grandmother   . Heart  failure Maternal Grandmother   . Thyroid disease Mother        hypothyroidism  . Heart disease Mother 41       CABG at 46  . Hypertension Mother   . Diverticulitis Mother 75       perforation, required surgery  . Pneumonia Sister   . Cancer Maternal Grandfather        lung  . Dementia Paternal Grandmother   . Cancer Paternal Grandmother        melanoma on her foot (not related to death)  . Breast cancer Paternal Aunt        63's  . Cancer Maternal Aunt        lung  . Cancer Maternal Uncle        lung  . Breast cancer Cousin        mets to lung  . Congestive Heart Failure Maternal Uncle   . Breast cancer Cousin  Outpatient Encounter Medications as of 12/07/2017  Medication Sig Note  . ACCU-CHEK FASTCLIX LANCETS MISC 1 each by Does not apply route 2 (two) times daily.   . betamethasone valerate ointment (VALISONE) 0.1 % Apply 1 application topically 2 (two) times daily.   . Calcium Carbonate-Vit D-Min (CALCIUM 600+D PLUS MINERALS) 600-400 MG-UNIT TABS Take 1 tablet by mouth 2 (two) times daily.   . cyclobenzaprine (FLEXERIL) 10 MG tablet Take 1 tablet (10 mg total) by mouth 3 (three) times daily as needed for muscle spasms.   [name] EPINEPHrine (EPI-PEN) 0.3 mg/0.3 mL SOAJ injection Inject 0.3 mg into the muscle once as needed. Reported on 04/30/2016   . glucose blood test strip 1 each by Other route 2 (two) times daily. Accu Check Guide   . lisinopril (PRINIVIL,ZESTRIL) 10 MG tablet Take 1 tablet (10 mg total) by mouth daily.   . Magnesium 250 MG TABS Take 250 mg by mouth daily. 03/31/2016: Takes for migraine prevention  . metFORMIN (GLUCOPHAGE-XR) 500 MG 24 hr tablet Take 2 tablets (1,000 mg total) by mouth 2 (two) times daily before a meal.   . METRONIDAZOLE, TOPICAL, 0.75 % LOTN Apply 1 application topically 2 (two) times daily.   . Multiple Vitamins-Minerals (MULTIVITAMIN PO) Take 1 tablet by mouth daily. Reported on 03/31/2016   . nystatin cream (MYCOSTATIN) Apply 1 application  topically 2 (two) times daily. Apply to affected area BID for up to 7 days.   . simvastatin (ZOCOR) 40 MG tablet Take 1 tablet (40 mg total) by mouth every evening.   . tamoxifen (NOLVADEX) 20 MG tablet Take 1 tablet (20 mg total) by mouth daily.   . valACYclovir (VALTREX) 1000 MG tablet Take 2 tablets together at onset of cold sore.  Repeat dose once in 12 hours (full course is 4 tabs)   . [DISCONTINUED] canagliflozin (INVOKANA) 300 MG TABS tablet Take 1 tablet (300 mg total) by mouth every morning.   . [DISCONTINUED] nystatin ointment (MYCOSTATIN) Apply 1 application topically 2 (two) times daily. Apply to vulva BID for up to 7 days.   [name] albuterol (PROVENTIL HFA;VENTOLIN HFA) 108 (90 BASE) MCG/ACT inhaler Inhale 2 puffs into the lungs every 6 (six) hours as needed for wheezing. Reported on 03/31/2016   . Dulaglutide (TRULICITY) 2.77 OE/4.2PN SOPN Inject 0.75 mg into the skin once a week.   . hydrOXYzine (ATARAX/VISTARIL) 10 MG tablet Take 1 tablet (10 mg total) by mouth 3 (three) times daily as needed. (Patient not taking: Reported on 12/07/2017) 12/07/2017: Used it once for a week with good results to calm down vaginal itching  . promethazine (PHENERGAN) 25 MG tablet Take 1 tablet (25 mg total) by mouth every 8 (eight) hours as needed for nausea. (Patient not taking: Reported on 12/07/2017)   . [DISCONTINUED] betamethasone valerate ointment (VALISONE) 0.1 % Apply a pea sized amount topically BID for 1-2 weeks   . [DISCONTINUED] Cholecalciferol (VITAMIN D3) 2000 UNITS TABS Take 2,000 Units by mouth daily.    No facility-administered encounter medications on file as of 12/07/2017.    (prior to today's visit, was taking Invokana, not Trulicity).  Allergies  Allergen Reactions  . Bee Venom Anaphylaxis  . Shellfish Allergy Anaphylaxis  . Penicillins Hives    ROS:  The patient denies anorexia, fever, vision changes, decreased hearing, ear pain, sore throat, breast concerns, chest pain, palpitations,  dizziness, syncope, dyspnea on exertion, cough, swelling, nausea, vomiting, diarrhea, constipation, abdominal pain, melena, hematochezia, indigestion/heartburn, hematuria, incontinence, dysuria, vaginal bleeding, discharge, odor,  genital lesions, numbness, tingling, weakness, tremor, suspicious skin lesions, depression, anxiety, abnormal bleeding/bruising, or enlarged lymph nodes. Some stress urinary incontinence (with exercise) L>R knee pain Rosacea is controlled, worse in the summer and certain foods. Migraines as per HPI, mild/infrequent Hoarseness as day progresses Rash periodically under breasts, and vaginal itching Occasional leg cramps   PHYSICAL EXAM:  BP 120/82   Pulse 76   Ht 5' 3"  (1.6 m)   Wt 257 lb (116.6 kg)   LMP 10/18/1982   BMI 45.53 kg/m   Wt Readings from Last 3 Encounters:  12/07/17 257 lb (116.6 kg)  11/14/17 257 lb 4.8 oz (116.7 kg)  10/03/17 258 lb 11.2 oz (117.3 kg)   273.6# in 06/2016.  No significant weight change in the last year  General Appearance:    Alert, cooperative, no distress, appears stated age, obese  Head:    Normocephalic, without obvious abnormality, atraumatic  Eyes:    PERRL, conjunctiva/corneas clear, EOM's intact, fundi benign  Ears:    Normal TM's and external ear canals  Nose:   Nares normal, mucosa is mildly edematous with clear mucus (and some distal yellow crusting) on the left. No sinus tenderness  Throat:   Lips, mucosa, and tongue normal; teeth and gums normal  Neck:   Supple, no lymphadenopathy;  thyroid:  no enlargement/tenderness/ nodules; no carotid bruit or JVD  Back:    Spine nontender, no curvature, ROM normal, no CVA tenderness  Lungs:     Clear to auscultation bilaterally without wheezes, rales or ronchi; respirations unlabored  Chest Wall:    No tenderness or deformity   Heart:    Regular rate and rhythm, S1 and S2 normal, no murmur, rub or gallop  Breast Exam:    Deferred to GYN  Abdomen:     Soft, non-tender,  nondistended, normoactive bowel sounds, no masses, no hepatosplenomegaly  Genitalia:    Deferred to GYN     Extremities:   No clubbing, cyanosis or edema. Normal diabetic foot exam  Pulses:   2+ and symmetric all extremities  Skin:   Skin color, texture, turgor normal, no rashes or lesions. Some diffuse hair thinning central anterior scalp  Lymph nodes:   Cervical, supraclavicular, and axillary nodes normal  Neurologic:   CNII-XII intact, normal strength, sensation and gait; reflexes 2+ and symmetric throughout                                Psych:   Normal mood, affect, hygiene and grooming.  Normal diabetic foot exam  Lab Results  Component Value Date   HGBA1C 6.2 12/07/2017    ASSESSMENT/PLAN:  Annual physical exam - Plan: POCT Urinalysis DIP (Proadvantage Device)  Essential hypertension - well controlled  Diabetes mellitus without complication ([place]) - well controlled; ?vag itch as SE from Invokana; rather than changing to med in same class, change to Trulicity to help with wt loss. Cont metformin - Plan: HgB A1c, Dulaglutide (TRULICITY) 0.35 KK/9.3GH SOPN, TSH, Microalbumin / creatinine urine ratio, Comprehensive metabolic panel  Hyperlipidemia, unspecified hyperlipidemia type - previously at goal on simvastatin, due for recheck - Plan: Lipid panel  Severe obesity (BMI >= 40) ([facility]) - continue pre-op bariatric work-up. Change to Trulicity for DM to see if helps  Migraine with typical aura - infrequent; stopped topirimate due to hair loss  Medication monitoring encounter - Plan: Microalbumin / creatinine urine ratio, Comprehensive metabolic panel, Lipid panel, CBC with  Differential/Platelet, Vitamin B12, Iron, Vitamin D 1,25 dihydroxy, Folate  Encounter for nutrition evaluation prior to bariatric surgery - Plan: Vitamin B12, Iron, Vitamin D 1,25 dihydroxy, Folate  Need for shingles vaccine - risks/side effects reviewed.  Return in 2-3 mos for 2nd. - Plan: Varicella-zoster  vaccine IM (Shingrix)  Colon cancer screening - Plan: Ambulatory referral to Gastroenterology    A1c, microalb, TSH, CBC, c-met, lipids  For [facility], needs: Vit D 0,50 Iron, folic acid, R67  Also needs H.pylori analysis urease activity (breath test--will schedule to get from them)  Send results to Dr. Kieth Brightly at [facility]   Refill SIMVASTATIN AND LISINOPRIL when labs back  [place]  Changing from Invokana to Trulicity, starting at 8.89BH dose. Sample wasn't available, but instructed on proper use by nurse, and rx sent to pharmacy. Given discount card. Risks/side effects reviewed in detail.   Discussed monthly self breast exams and yearly mammograms; at least 30 minutes of aerobic activity at least 5 days/week, weight-bearing exercise; proper sunscreen use reviewed; healthy diet, including goals of calcium and vitamin D intake and alcohol recommendations (less than or equal to 1 drink/day) reviewed; regular seatbelt use; changing batteries in smoke detectors.  Immunization recommendations discussed--continue yearly flu shots; Shingrix counseling done, given first dose today. Colonoscopy recommendations reviewed, due now, refer back to Dr. Collene Mares

## 2017-12-07 ENCOUNTER — Other Ambulatory Visit: Payer: Self-pay | Admitting: Family Medicine

## 2017-12-07 ENCOUNTER — Encounter: Payer: Self-pay | Admitting: Family Medicine

## 2017-12-07 ENCOUNTER — Ambulatory Visit (INDEPENDENT_AMBULATORY_CARE_PROVIDER_SITE_OTHER): Payer: No Typology Code available for payment source | Admitting: Family Medicine

## 2017-12-07 VITALS — BP 120/82 | HR 76 | Ht 63.0 in | Wt 257.0 lb

## 2017-12-07 DIAGNOSIS — I10 Essential (primary) hypertension: Secondary | ICD-10-CM

## 2017-12-07 DIAGNOSIS — G43109 Migraine with aura, not intractable, without status migrainosus: Secondary | ICD-10-CM | POA: Diagnosis not present

## 2017-12-07 DIAGNOSIS — Z713 Dietary counseling and surveillance: Secondary | ICD-10-CM | POA: Diagnosis not present

## 2017-12-07 DIAGNOSIS — Z1211 Encounter for screening for malignant neoplasm of colon: Secondary | ICD-10-CM

## 2017-12-07 DIAGNOSIS — Z5181 Encounter for therapeutic drug level monitoring: Secondary | ICD-10-CM | POA: Diagnosis not present

## 2017-12-07 DIAGNOSIS — Z23 Encounter for immunization: Secondary | ICD-10-CM | POA: Diagnosis not present

## 2017-12-07 DIAGNOSIS — E785 Hyperlipidemia, unspecified: Secondary | ICD-10-CM

## 2017-12-07 DIAGNOSIS — E119 Type 2 diabetes mellitus without complications: Secondary | ICD-10-CM

## 2017-12-07 DIAGNOSIS — Z0000 Encounter for general adult medical examination without abnormal findings: Secondary | ICD-10-CM

## 2017-12-07 LAB — POCT URINALYSIS DIP (PROADVANTAGE DEVICE)
BILIRUBIN UA: NEGATIVE mg/dL
Bilirubin, UA: NEGATIVE
Blood, UA: NEGATIVE
Glucose, UA: 250 mg/dL — AB
Leukocytes, UA: NEGATIVE
Nitrite, UA: NEGATIVE
PH UA: 6 (ref 5.0–8.0)
PROTEIN UA: NEGATIVE mg/dL
SPECIFIC GRAVITY, URINE: 1.025
Urobilinogen, Ur: NEGATIVE

## 2017-12-07 LAB — POCT GLYCOSYLATED HEMOGLOBIN (HGB A1C): Hemoglobin A1C: 6.2

## 2017-12-07 MED ORDER — DULAGLUTIDE 0.75 MG/0.5ML ~~LOC~~ SOAJ: 0.7500 mg | SUBCUTANEOUS | 2 refills | Status: DC

## 2017-12-07 MED FILL — TRULICITY 0.75 MG/0.5 ML PE: 0.75 | 28 days supply | Qty: 2 | Fill #0

## 2017-12-07 NOTE — Patient Instructions (Addendum)
  HEALTH MAINTENANCE RECOMMENDATIONS:  It is recommended that you get at least 30 minutes of aerobic exercise at least 5 days/week (for weight loss, you may need as much as 60-90 minutes). This can be any activity that gets your heart rate up. This can be divided in 10-15 minute intervals if needed, but try and build up your endurance at least once a week.  Weight bearing exercise is also recommended twice weekly.  Eat a healthy diet with lots of vegetables, fruits and fiber.  "Colorful" foods have a lot of vitamins (ie green vegetables, tomatoes, red peppers, etc).  Limit sweet tea, regular sodas and alcoholic beverages, all of which has a lot of calories and sugar.  Up to 1 alcoholic drink daily may be beneficial for women (unless trying to lose weight, watch sugars).  Drink a lot of water.  Calcium recommendations are 1200-1500 mg daily (1500 mg for postmenopausal women or women without ovaries), and vitamin D 1000 IU daily.  This should be obtained from diet and/or supplements (vitamins), and calcium should not be taken all at once, but in divided doses.  Monthly self breast exams and yearly mammograms for women over the age of 61 is recommended.  Sunscreen of at least SPF 30 should be used on all sun-exposed parts of the skin when outside between the hours of 10 am and 4 pm (not just when at beach or pool, but even with exercise, golf, tennis, and yard work!)  Use a sunscreen that says "broad spectrum" so it covers both UVA and UVB rays, and make sure to reapply every 1-2 hours.  Remember to change the batteries in your smoke detectors when changing your clock times in the spring and fall.  Use your seat belt every time you are in a car, and please drive safely and not be distracted with cell phones and texting while driving.  Stop the Invokana. We are changing you to Trulicity once weekly injections instead.  This should help keep your diabetes controlled, and possibly help with weight loss.   We can then also see if your vaginal itching improves being off the [place].  We start with the lower Trulicity dose. If you are tolerating it, but your sugars bump up, let us know, and we can increase to the higher dose in 4 weeks.  I think your hoarseness is from postnasal drip. Consider trying an allergy medication such as claritin daily.

## 2017-12-08 MED FILL — ACCU-CHEK FASTCLIX LANCETS: 51 days supply | Qty: 102 | Fill #0

## 2017-12-08 MED FILL — ACCU-CHEK GUIDE TEST STRIP: 51 days supply | Qty: 100 | Fill #0

## 2017-12-09 ENCOUNTER — Encounter: Payer: Self-pay | Admitting: Family Medicine

## 2017-12-13 ENCOUNTER — Other Ambulatory Visit: Payer: Self-pay | Admitting: Family Medicine

## 2017-12-13 DIAGNOSIS — E785 Hyperlipidemia, unspecified: Secondary | ICD-10-CM

## 2017-12-13 DIAGNOSIS — I10 Essential (primary) hypertension: Secondary | ICD-10-CM

## 2017-12-13 LAB — CBC WITH DIFFERENTIAL/PLATELET
BASOS: 0 %
Basophils Absolute: 0 10*3/uL (ref 0.0–0.2)
EOS (ABSOLUTE): 0.2 10*3/uL (ref 0.0–0.4)
EOS: 2 %
HEMATOCRIT: 42.5 % (ref 34.0–46.6)
Hemoglobin: 14.1 g/dL (ref 11.1–15.9)
IMMATURE GRANULOCYTES: 0 %
Immature Grans (Abs): 0 10*3/uL (ref 0.0–0.1)
LYMPHS ABS: 3.5 10*3/uL — AB (ref 0.7–3.1)
Lymphs: 41 %
MCH: 30.9 pg (ref 26.6–33.0)
MCHC: 33.2 g/dL (ref 31.5–35.7)
MCV: 93 fL (ref 79–97)
MONOS ABS: 0.9 10*3/uL (ref 0.1–0.9)
Monocytes: 10 %
NEUTROS ABS: 4.1 10*3/uL (ref 1.4–7.0)
NEUTROS PCT: 47 %
PLATELETS: 121 10*3/uL — AB (ref 150–379)
RBC: 4.57 x10E6/uL (ref 3.77–5.28)
RDW: 14.3 % (ref 12.3–15.4)
WBC: 8.7 10*3/uL (ref 3.4–10.8)

## 2017-12-13 LAB — COMPREHENSIVE METABOLIC PANEL
ALBUMIN: 4 g/dL (ref 3.6–4.8)
ALT: 20 IU/L (ref 0–32)
AST: 36 IU/L (ref 0–40)
Albumin/Globulin Ratio: 1.5 (ref 1.2–2.2)
Alkaline Phosphatase: 74 IU/L (ref 39–117)
BUN/Creatinine Ratio: 13 (ref 12–28)
BUN: 11 mg/dL (ref 8–27)
Bilirubin Total: 0.4 mg/dL (ref 0.0–1.2)
CALCIUM: 9.4 mg/dL (ref 8.7–10.3)
CO2: 22 mmol/L (ref 20–29)
CREATININE: 0.85 mg/dL (ref 0.57–1.00)
Chloride: 101 mmol/L (ref 96–106)
GFR calc Af Amer: 86 mL/min/{1.73_m2} (ref >59)
GFR, EST NON AFRICAN AMERICAN: 75 mL/min/{1.73_m2} (ref >59)
GLOBULIN, TOTAL: 2.7 g/dL (ref 1.5–4.5)
GLUCOSE: 82 mg/dL (ref 65–99)
Potassium: 4.2 mmol/L (ref 3.5–5.2)
SODIUM: 138 mmol/L (ref 134–144)
Total Protein: 6.7 g/dL (ref 6.0–8.5)

## 2017-12-13 LAB — FOLATE: Folate: >20 ng/mL (ref >3.0)

## 2017-12-13 LAB — LIPID PANEL
CHOL/HDL RATIO: 1.9 ratio (ref 0.0–4.4)
Cholesterol, Total: 133 mg/dL (ref 100–199)
HDL: 71 mg/dL (ref >39)
LDL CALC: 48 mg/dL (ref 0–99)
Triglycerides: 69 mg/dL (ref 0–149)
VLDL CHOLESTEROL CAL: 14 mg/dL (ref 5–40)

## 2017-12-13 LAB — VITAMIN D 1,25 DIHYDROXY
Vitamin D 1, 25 (OH)2 Total: 28 pg/mL
Vitamin D2 1, 25 (OH)2: <10 pg/mL
Vitamin D3 1, 25 (OH)2: 27 pg/mL

## 2017-12-13 LAB — MICROALBUMIN / CREATININE URINE RATIO
Creatinine, Urine: 125.8 mg/dL
MICROALBUM., U, RANDOM: 8.7 ug/mL
Microalb/Creat Ratio: 6.9 mg/g creat (ref 0.0–30.0)

## 2017-12-13 LAB — IRON: Iron: 84 ug/dL (ref 27–159)

## 2017-12-13 LAB — TSH: TSH: 2.2 u[IU]/mL (ref 0.450–4.500)

## 2017-12-13 LAB — VITAMIN B12: Vitamin B-12: 378 pg/mL (ref 232–1245)

## 2017-12-13 MED ORDER — LISINOPRIL 10 MG PO TABS: 10.0000 mg | ORAL_TABLET | Freq: Every day | ORAL | 0 refills | Status: DC

## 2017-12-13 MED ORDER — SIMVASTATIN 40 MG PO TABS: 40.0000 mg | ORAL_TABLET | Freq: Every evening | ORAL | 0 refills | Status: DC

## 2017-12-13 MED FILL — LISINOPRIL 10 MG TABS: 10 | 90 days supply | Qty: 90 | Fill #0

## 2017-12-13 MED FILL — SIMVASTATIN 40 MG TABLET: 40 | 90 days supply | Qty: 90 | Fill #0

## 2017-12-19 ENCOUNTER — Encounter: Payer: Self-pay | Admitting: Family Medicine

## 2017-12-20 ENCOUNTER — Telehealth: Payer: Self-pay | Admitting: *Deleted

## 2017-12-20 NOTE — Telephone Encounter (Signed)
Patient has [facility] Focus plan and has 4 upcoming appt's scheduled: 1)12/26/17 K.Purcell Nails, NP [facility] for clearance for weightloss loss surgery. 2) 12/26/17 [facility] for H.Pylori Breath Test 3) 01/03/18 [facility] psych eval for surgery 4) 01/05/18 Dr Collene Mares colonoscopy consult.

## 2017-12-21 NOTE — Progress Notes (Signed)
Cardiology Office Note   Date:  12/26/2017   ID:  [name], [name] 06-May-1956, MRN [id number]  PCP:  Rita Ohara, MD  Cardiologist: Dr. Oval Linsey Chief Complaint  Patient presents with  . Pre-op Exam    Bariatic Surgery      History of Present Illness: Cindy Carey is a 62 y.o. 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   She was seen by Dr. Oval Linsey outpatient on 08/12/2015. Due to strong family history of artery artery disease with her mother also having coronary artery bypass grafting, she was advised to follow with cardiology.  When last seen the patient was doing well, she denied any symptoms. Main complaint was some left knee pain when she was walking. The patient was found have an incomplete right bundle branch block on EKG. She was not referred for stress testing as she was asymptomatic. Medication changes were made at that time.  The patient is here for preoperative evaluation for weight loss surgery. She complains of overall fatigue, inability to walk very far, due to lateral knee pain. He is undergoing bariatric surgery pre-evaluation with multiple classes on nutrition, psychological evaluation, medication evaluation, and is here for preoperative evaluation from cardiac standpoint prior to undergoing surgery.  Past Medical History:  Diagnosis Date  . Blood transfusion without reported diagnosis 1983  . Diabetes mellitus without complication ([place]) 3546   T2DM  . Dyspareunia   . Exercise-induced asthma    worse in winter  . H/O bone density study 2013  . H/O cold sores   . H/O colonoscopy 2009  . History of endometriosis   . Hyperlipidemia   . Hypertension   . Injury of tendon of rotator cuff 2015   gym injury (right)  . Knee pain 08/2015   L>R  . Migraine with typical aura   . Morbid obesity ([place]) 09/04/12   BMI 49.4 kg/m^2     Past  Surgical History:  Procedure Laterality Date  . BREAST BIOPSY  2010  . BREAST LUMPECTOMY  2010  . BREAST LUMPECTOMY WITH RADIOACTIVE SEED LOCALIZATION Left 07/23/2015   Procedure: LEFT BREAST SEED GUIDED EXCISION;  Surgeon: Rolm Bookbinder, MD;  Location: [facility];  Service: General;  Laterality: Left;  . BREATH TEK H PYLORI  08/28/2012   Procedure: BREATH TEK H PYLORI;  Surgeon: Pedro Earls, MD;  Location: [facility];  Service: General;  Laterality: N/A;  . CHOLECYSTECTOMY  2007  . EXPLORATORY LAPAROTOMY  1983   endometriosis  . GANGLION CYST EXCISION Left 1999   Foot  . TOTAL ABDOMINAL HYSTERECTOMY W/ BILATERAL SALPINGOOPHORECTOMY  1984   endometriosis (and appendectomy)     Current Outpatient Medications  Medication Sig Dispense Refill  . ACCU-CHEK FASTCLIX LANCETS MISC USE TO CHECK BLOOD SUGAR TWICE A DAY AS DIRECTED 102 each 3  . ACCU-CHEK GUIDE test strip USE TO CHECK BLOOD SUGAR TWICE A DAY AS DIRECTED 100 each 3  . albuterol (PROVENTIL HFA;VENTOLIN HFA) 108 (90 BASE) MCG/ACT inhaler Inhale 2 puffs into the lungs every 6 (six) hours as needed for wheezing. Reported on 03/31/2016    . betamethasone valerate ointment (VALISONE) 0.1 % Apply 1 application topically 2 (two) times daily. 30 g 0  . Calcium Carbonate-Vit D-Min (CALCIUM 600+D PLUS MINERALS) 600-400 MG-UNIT TABS Take 1 tablet by mouth 2 (two) times daily.    . cyclobenzaprine (FLEXERIL) 10 MG tablet Take 1 tablet (  10 mg total) by mouth 3 (three) times daily as needed for muscle spasms. 30 tablet 0  . diphenhydrAMINE (BENADRYL) 50 MG tablet Take 1 tablet (50 mg total) by mouth once for 1 dose. TAKE 1 hour prior to test 1 tablet 0  . Dulaglutide (TRULICITY) 0.53 ZJ/6.7HA SOPN Inject 0.75 mg into the skin once a week. 4 pen 2  . EPINEPHrine (EPI-PEN) 0.3 mg/0.3 mL SOAJ injection Inject 0.3 mg into the muscle once as needed. Reported on 04/30/2016    . hydrOXYzine (ATARAX/VISTARIL) 10 MG tablet Take 1  tablet (10 mg total) by mouth 3 (three) times daily as needed. (Patient not taking: Reported on 12/07/2017) 30 tablet 0  . lisinopril (PRINIVIL,ZESTRIL) 10 MG tablet Take 1 tablet (10 mg total) by mouth daily. 90 tablet 0  . Magnesium 250 MG TABS Take 250 mg by mouth daily.    . metFORMIN (GLUCOPHAGE-XR) 500 MG 24 hr tablet Take 2 tablets (1,000 mg total) by mouth 2 (two) times daily before a meal. 360 tablet 0  . metoprolol tartrate (LOPRESSOR) 25 MG tablet Take 1 tablet (25 mg total) by mouth 2 (two) times daily. 180 tablet 3  . METRONIDAZOLE, TOPICAL, 0.75 % LOTN Apply 1 application topically 2 (two) times daily.    . Multiple Vitamins-Minerals (MULTIVITAMIN PO) Take 1 tablet by mouth daily. Reported on 03/31/2016    . nystatin cream (MYCOSTATIN) Apply 1 application topically 2 (two) times daily. Apply to affected area BID for up to 7 days. 30 g 0  . predniSONE (DELTASONE) 50 MG tablet Take 1TAB 13 hours prior to test;1 TAB Prednisone 50 mg 7 hours prior to test;1TAB Prednisone 50 mg 1 hour prior to test 3 tablet 0  . promethazine (PHENERGAN) 25 MG tablet Take 1 tablet (25 mg total) by mouth every 8 (eight) hours as needed for nausea. (Patient not taking: Reported on 12/07/2017) 30 tablet 0  . simvastatin (ZOCOR) 40 MG tablet Take 1 tablet (40 mg total) by mouth every evening. 90 tablet 0  . tamoxifen (NOLVADEX) 20 MG tablet Take 1 tablet (20 mg total) by mouth daily. 90 tablet 3  . valACYclovir (VALTREX) 1000 MG tablet Take 2 tablets together at onset of cold sore.  Repeat dose once in 12 hours (full course is 4 tabs) 40 tablet 0   No current facility-administered medications for this visit.     Allergies:   Bee venom; Shellfish allergy; and Penicillins    Social History:  The patient  reports that  has never smoked. she has never used smokeless tobacco. She reports that she does not drink alcohol or use drugs.   Family History:  The patient's family history includes Breast cancer in her  cousin, cousin, and paternal aunt; Cancer in her maternal aunt, maternal grandfather, maternal uncle, and paternal grandmother; Congestive Heart Failure in her maternal uncle; Dementia in her paternal grandmother; Diabetes in her father and paternal grandfather; Diverticulitis (age of onset: 42) in her mother; Heart attack (age of onset: 92) in her father; Heart disease in her father and paternal grandfather; Heart disease (age of onset: 37) in her mother; Heart failure in her maternal grandmother; Hypertension in her father and mother; Osteoporosis in her maternal grandmother; Peripheral Artery Disease in her father; Pneumonia in her sister; Thyroid disease in her mother.    ROS: All other systems are reviewed and negative. Unless otherwise mentioned in H&P    PHYSICAL EXAM: VS:  BP 136/84   Pulse 76   Ht  5\' 3"  (1.6 m)   Wt 260 lb (117.9 kg)   LMP 10/18/1982   BMI 46.06 kg/m  , BMI Body mass index is 46.06 kg/m. GEN: Well nourished, well developed, in no acute distress obese HEENT: normal  Neck: no JVD, carotid bruits, or masses Cardiac: RRR; no murmurs, rubs, or gallops,no edema  Respiratory:  clear to auscultation bilaterally, normal work of breathing GI: soft, nontender, nondistended, + BS MS: no deformity or atrophy  Skin: warm and dry, no rash Neuro:  Strength and sensation are intact Psych: euthymic mood, flat affect   EKG: NSR Left Axis Deviation Rate of 76 bpm  Recent Labs: 12/07/2017: ALT 20; BUN 11; Creatinine, Ser 0.85; Hemoglobin 14.1; Platelets 121; Potassium 4.2; Sodium 138; TSH 2.200    Lipid Panel    Component Value Date/Time   CHOL 133 12/07/2017 1619   TRIG 69 12/07/2017 1619   HDL 71 12/07/2017 1619   CHOLHDL 1.9 12/07/2017 1619   CHOLHDL 2.3 09/30/2016 0811   VLDL 26 09/30/2016 0811   LDLCALC 48 12/07/2017 1619      Wt Readings from Last 3 Encounters:  12/26/17 260 lb (117.9 kg)  12/07/17 257 lb (116.6 kg)  11/14/17 257 lb 4.8 oz (116.7 kg)       Other studies Reviewed: 12/26/2003 SUMMARY - Overall left ventricular systolic function was normal. Left    ventricular ejection fraction was estimated , range being 55    % to 65 %.. - Normal saline contrast study. IMPRESSIONS - No definite intracardiac shunt ( No PFO or ASD) - There was no echocardiographic evidence for a cardiac source of    embolism.  ASSESSMENT AND PLAN:  1. Preoperative cardiac evaluation: The patient is to undergo bariatric surgery with Dr. Cherlyn Roberts  at [facility] surgery, date has not been scheduled. She is undergoing other testing and classes as well.   CVRF: Obesity, Strong FH of CAD, Diabetes, and Hypercholesterolemia.   Will plan Cardiac CT with FFR for evaluation, as her body habitus may not be optimall for [employer]. She has trouble walking due to her knees. She is instructed to take metoprolol 25 mg that am. She has dye allergy and is given pre-medication regimen.BMET will be ordered.  I have explained the CTA to her and she is willing to proceed. If Cardiac CT is   2. Hypertension: BP is well controlled currently on lisinopril  3. Diabetes: Controlled by PCP   Current medicines are reviewed at length with the patient today.    Labs/ tests ordered today include: BMET  Phill Myron. West Pugh, ANP, AACC   12/26/2017 10:20 AM    [facility] [address], [place], [place] [address] Phone: [phone]; Fax: [phone]

## 2017-12-23 ENCOUNTER — Telehealth: Payer: Self-pay | Admitting: Cardiovascular Disease

## 2017-12-23 NOTE — Telephone Encounter (Signed)
Received incoming records from Geoffery Spruce, MD for upcoming appointment on 12/26/17 @ 8am with Jory Sims. Records located in Medical Records. 12/23/17 ab

## 2017-12-26 ENCOUNTER — Encounter: Payer: Self-pay | Admitting: Adult Health

## 2017-12-26 ENCOUNTER — Ambulatory Visit: Payer: No Typology Code available for payment source | Admitting: Adult Health

## 2017-12-26 VITALS — BP 136/84 | HR 76 | Ht 63.0 in | Wt 260.0 lb

## 2017-12-26 DIAGNOSIS — E785 Hyperlipidemia, unspecified: Secondary | ICD-10-CM

## 2017-12-26 DIAGNOSIS — I2510 Atherosclerotic heart disease of native coronary artery without angina pectoris: Secondary | ICD-10-CM

## 2017-12-26 DIAGNOSIS — I10 Essential (primary) hypertension: Secondary | ICD-10-CM | POA: Diagnosis not present

## 2017-12-26 DIAGNOSIS — E7800 Pure hypercholesterolemia, unspecified: Secondary | ICD-10-CM | POA: Diagnosis not present

## 2017-12-26 DIAGNOSIS — Z79899 Other long term (current) drug therapy: Secondary | ICD-10-CM

## 2017-12-26 DIAGNOSIS — Z01810 Encounter for preprocedural cardiovascular examination: Secondary | ICD-10-CM | POA: Diagnosis not present

## 2017-12-26 MED ORDER — DIPHENHYDRAMINE HCL 50 MG PO TABS: 50.0000 mg | ORAL_TABLET | Freq: Once | ORAL | 0 refills | Status: DC

## 2017-12-26 MED ORDER — PREDNISONE 50 MG PO TABS: ORAL_TABLET | ORAL | 0 refills | Status: DC

## 2017-12-26 MED ORDER — METOPROLOL TARTRATE 25 MG PO TABS: 25.0000 mg | ORAL_TABLET | Freq: Two times a day (BID) | ORAL | 3 refills | Status: DC

## 2017-12-26 MED FILL — predniSONE 50 MG TABS: 50 | 1 days supply | Qty: 3 | Fill #0

## 2017-12-26 MED FILL — METOPROLOL TARTRATE 25 MG T: 25 | 90 days supply | Qty: 180 | Fill #0

## 2017-12-26 NOTE — Patient Instructions (Signed)
Medication Instructions:  NO CHANGES- Your physician recommends that you continue on your current medications as directed. Please refer to the Current Medication list given to you today.  If you need a refill on your cardiac medications before your next appointment, please call your pharmacy.  Labwork: BMET 3DAYS/ 1 WEEK BEFORE SCHEDULED CT EXAM HERE IN OUR OFFICE AT [facility]  Take the provided lab slips for you to take with you to the lab for you blood draw.    You will NOT need to fast   Testing/Procedures: TAKE METOPROLOL THE MORNING OF THE CT EXAM  Do not takeMetformin 24 hours prior to [place] CT  Follow-Up: Your physician wants you to follow-up in: 3 MONTHS WITH DR [facility]    Thank you for choosing [facility] at [facility]!!    Please arrive at the [facility] main entrance of [facility] at   AM (30-45 minutes prior to test start time)  [facility] [place], [place] [address] [phone]  Proceed to the [facility] Radiology Department (First Floor).  Please follow these instructions carefully (unless otherwise directed):  Hold all erectile dysfunction medications at least 48 hours prior to test.  On the Night Before the Test: Drink plenty of water. Do not consume any caffeinated/decaffeinated beverages or chocolate 12 hours prior to your test. Do not take any antihistamines 12 hours prior to your test. If you take Metformin do not take 24 hours prior to test(THE NIGHT BEFORE AND THE MORNING OF THE CT). If the patient has contrast allergy: Patient will need a prescription for Prednisone and very clear instructions (as follows): 1. Prednisone 50 mg - take 13 hours prior to test 2. Take another Prednisone 50 mg 7 hours prior to test 3. Take another Prednisone 50 mg 1 hour prior to test 4. Take Benadryl 50 mg 1 hour prior to test 5. Patient must complete all four doses of above prophylactic  medications.  Patient will need a ride after test due to Benadryl.  On the Day of the Test: Drink plenty of water. Do not drink any water within one hour of the test. Do not eat any food 4 hours prior to the test. You may take your regular medications prior to the test. IF NOT ON A BETA BLOCKER - Take 50 mg of Lopressor (metoprolol) one hour before the test. HOLD Furosemide morning of the test.  After the Test: 1. Drink plenty of water. 2. After receiving IV contrast, you may experience a mild flushed feeling. This is normal. On occasion, you may experience a mild rash up to 24 hours after the test. This is not dangerous. If this occurs, you can take Benadryl 25 mg and increase your fluid intake. If you experience trouble breathing, this can be serious. If it is severe call 911 IMMEDIATELY! If it is mild, please call our office. If you take any of these medications: Glipizide/Metformin, Avandament, Glucavance, please do not take 48 hours after completing test.

## 2017-12-30 ENCOUNTER — Encounter: Payer: Self-pay | Admitting: Family Medicine

## 2017-12-30 ENCOUNTER — Encounter: Payer: Self-pay | Admitting: Adult Health

## 2017-12-30 DIAGNOSIS — E119 Type 2 diabetes mellitus without complications: Secondary | ICD-10-CM

## 2017-12-30 MED ORDER — DULAGLUTIDE 1.5 MG/0.5ML ~~LOC~~ SOAJ: 1.5000 mg | SUBCUTANEOUS | 1 refills | Status: DC

## 2017-12-30 MED FILL — TRULICITY 1.5 MG/0.5 ML PEN: 1.5 | 28 days supply | Qty: 2 | Fill #0

## 2018-01-02 ENCOUNTER — Encounter: Payer: Self-pay | Admitting: Family Medicine

## 2018-01-03 ENCOUNTER — Ambulatory Visit (INDEPENDENT_AMBULATORY_CARE_PROVIDER_SITE_OTHER): Payer: No Typology Code available for payment source | Admitting: Psychiatry

## 2018-01-03 DIAGNOSIS — F509 Eating disorder, unspecified: Secondary | ICD-10-CM | POA: Diagnosis not present

## 2018-01-03 NOTE — Addendum Note (Signed)
Addended by: Waylan Rocher on: 01/03/2018 11:41 AM   Modules accepted: Orders

## 2018-01-03 NOTE — Telephone Encounter (Signed)
DISCUSSED W/PT NO FURTHER QUESTIONS

## 2018-01-06 MED FILL — ONDANSETRON ODT 4 MG TABLET: 4 | 7 days supply | Qty: 20 | Fill #0

## 2018-01-06 MED FILL — PANTOPRAZOLE SOD DR 40 MG T: 40 | 30 days supply | Qty: 30 | Fill #0

## 2018-01-11 ENCOUNTER — Encounter: Payer: Self-pay | Admitting: Family Medicine

## 2018-01-11 MED FILL — GAVILYTE-G SOLUTION: 236 | 1 days supply | Qty: 4000 | Fill #0

## 2018-01-12 ENCOUNTER — Encounter: Payer: Self-pay | Admitting: Family Medicine

## 2018-01-12 MED ORDER — METRONIDAZOLE 0.75 % EX LOTN: 1.0000 "application " | TOPICAL_LOTION | Freq: Two times a day (BID) | CUTANEOUS | 12 refills | Status: DC

## 2018-01-12 MED FILL — metroNIDAZOLE 0.75 % LOTN: 0.75 | 20 days supply | Qty: 59 | Fill #0

## 2018-01-17 LAB — BASIC METABOLIC PANEL
BUN / CREAT RATIO: 14 (ref 12–28)
BUN: 11 mg/dL (ref 8–27)
CALCIUM: 9.5 mg/dL (ref 8.7–10.3)
CO2: 21 mmol/L (ref 20–29)
Chloride: 102 mmol/L (ref 96–106)
Creatinine, Ser: 0.78 mg/dL (ref 0.57–1.00)
GFR, EST AFRICAN AMERICAN: 96 mL/min/{1.73_m2} (ref >59)
GFR, EST NON AFRICAN AMERICAN: 83 mL/min/{1.73_m2} (ref >59)
Glucose: 109 mg/dL — ABNORMAL HIGH (ref 65–99)
Potassium: 4.2 mmol/L (ref 3.5–5.2)
Sodium: 139 mmol/L (ref 134–144)

## 2018-01-18 ENCOUNTER — Ambulatory Visit (HOSPITAL_COMMUNITY): Admission: RE | Admit: 2018-01-18 | Payer: No Typology Code available for payment source | Source: Ambulatory Visit

## 2018-01-18 ENCOUNTER — Ambulatory Visit (HOSPITAL_COMMUNITY)
Admission: RE | Admit: 2018-01-18 | Discharge: 2018-01-18 | Disposition: A | Discharge status: Home / Self Care | Payer: No Typology Code available for payment source | Source: Ambulatory Visit | Attending: Adult Health | Admitting: Adult Health

## 2018-01-18 DIAGNOSIS — Z8249 Family history of ischemic heart disease and other diseases of the circulatory system: Secondary | ICD-10-CM | POA: Insufficient documentation

## 2018-01-18 DIAGNOSIS — I2510 Atherosclerotic heart disease of native coronary artery without angina pectoris: Secondary | ICD-10-CM | POA: Insufficient documentation

## 2018-01-18 DIAGNOSIS — Z01810 Encounter for preprocedural cardiovascular examination: Secondary | ICD-10-CM | POA: Diagnosis not present

## 2018-01-18 DIAGNOSIS — R079 Chest pain, unspecified: Secondary | ICD-10-CM | POA: Diagnosis not present

## 2018-01-18 MED ORDER — METOPROLOL TARTRATE 5 MG/5ML IV SOLN
5.0000 mg | INTRAVENOUS | Status: DC | PRN
Start: 2018-01-18 — End: 2018-01-19
  Administered 2018-01-18 (×3): 5 mg via INTRAVENOUS
  Filled 2018-01-18 (×4): qty 5

## 2018-01-18 MED ORDER — IOPAMIDOL (ISOVUE-370) INJECTION 76%
INTRAVENOUS | Status: AC
  Filled 2018-01-18: qty 100

## 2018-01-18 MED ORDER — METOPROLOL TARTRATE 5 MG/5ML IV SOLN
INTRAVENOUS | Status: AC
  Administered 2018-01-18: 5 mg via INTRAVENOUS
  Filled 2018-01-18: qty 20

## 2018-01-18 MED ORDER — NITROGLYCERIN 0.4 MG SL SUBL
SUBLINGUAL_TABLET | SUBLINGUAL | Status: AC
  Administered 2018-01-18: 0.8 mg via SUBLINGUAL
  Filled 2018-01-18: qty 2

## 2018-01-18 MED ORDER — NITROGLYCERIN 0.4 MG SL SUBL
0.8000 mg | SUBLINGUAL_TABLET | SUBLINGUAL | Status: DC | PRN
Start: 2018-01-18 — End: 2018-01-19
  Administered 2018-01-18: 0.8 mg via SUBLINGUAL
  Filled 2018-01-18 (×2): qty 25

## 2018-01-18 MED ORDER — IOPAMIDOL (ISOVUE-370) INJECTION 76%
100.0000 mL | Freq: Once | INTRAVENOUS | Status: AC | PRN
  Administered 2018-01-18: 100 mL via INTRAVENOUS

## 2018-01-18 MED FILL — TAMOXIFEN CITRATE 20 MG TAB: 20 | 90 days supply | Qty: 90 | Fill #3

## 2018-01-18 MED FILL — traMADol HCL 50 MG TABS: 50 | 7 days supply | Qty: 30 | Fill #0

## 2018-01-20 ENCOUNTER — Encounter: Payer: Self-pay | Admitting: Family Medicine

## 2018-01-24 MED FILL — TRULICITY 1.5 MG/0.5 ML PEN: 1.5 | 28 days supply | Qty: 2 | Fill #1

## 2018-01-25 MED FILL — ACCU-CHEK FASTCLIX LANCETS: 51 days supply | Qty: 102 | Fill #1

## 2018-01-25 MED FILL — ACCU-CHEK GUIDE TEST STRIP: 51 days supply | Qty: 100 | Fill #1

## 2018-02-07 MED FILL — metroNIDAZOLE 0.75 % LOTN: 0.75 | 20 days supply | Qty: 59 | Fill #1

## 2018-02-07 MED FILL — PANTOPRAZOLE SOD DR 40 MG T: 40 | 30 days supply | Qty: 30 | Fill #1

## 2018-02-10 ENCOUNTER — Other Ambulatory Visit (INDEPENDENT_AMBULATORY_CARE_PROVIDER_SITE_OTHER): Payer: No Typology Code available for payment source

## 2018-02-10 ENCOUNTER — Encounter: Payer: Self-pay | Admitting: Family Medicine

## 2018-02-10 ENCOUNTER — Ambulatory Visit: Payer: 59 | Admitting: Hematology and Oncology

## 2018-02-10 DIAGNOSIS — Z23 Encounter for immunization: Secondary | ICD-10-CM

## 2018-02-10 DIAGNOSIS — N6099 Unspecified benign mammary dysplasia of unspecified breast: Secondary | ICD-10-CM

## 2018-02-10 MED ORDER — EPINEPHRINE 0.3 MG/0.3ML IJ SOAJ: 0.3000 mg | Freq: Once | INTRAMUSCULAR | 1 refills | Status: DC | PRN

## 2018-02-14 ENCOUNTER — Telehealth: Payer: Self-pay | Admitting: Hematology and Oncology

## 2018-02-14 ENCOUNTER — Encounter: Payer: Self-pay | Admitting: Hematology and Oncology

## 2018-02-14 NOTE — Telephone Encounter (Signed)
LVM for patient regarding 4/30 sch message.

## 2018-02-14 NOTE — Telephone Encounter (Signed)
Patient called to cancel °

## 2018-02-17 ENCOUNTER — Ambulatory Visit: Payer: 59 | Admitting: Hematology and Oncology

## 2018-02-20 LAB — HM COLONOSCOPY

## 2018-02-21 MED FILL — TRULICITY 1.5 MG/0.5 ML PEN: 1.5 | 28 days supply | Qty: 2 | Fill #2

## 2018-02-22 MED FILL — metroNIDAZOLE 0.75 % LOTN: 0.75 | 20 days supply | Qty: 59 | Fill #2

## 2018-02-23 ENCOUNTER — Encounter: Payer: Self-pay | Admitting: *Deleted

## 2018-02-24 ENCOUNTER — Encounter: Payer: Self-pay | Admitting: Family Medicine

## 2018-02-28 NOTE — Assessment & Plan Note (Signed)
Left breast intraductal papilloma with atypical ductal hyperplasia diagnosed 07/15/2015 status post lumpectomy 07/23/2015  Prognosis: Based upon American Cancer Society breast cancer risk assessment tool, her five-year risk of breast cancer that 4.6%, and average woman's risk would be 1.9%: Her lifetime risk of breast cancer would be 25%, average risk would be 9.6%  Current treatment: risk lowering therapy with Tamoxifen 20 mg daily  Tamoxifen Toxicities:  Fogginess in the head and Lack of interest in planning on doing any activity have resolved  Breast Cancer Surveillance: 1. Breast exam 03/01/18: Benign 2. Mammogram 07/18/17: No mammographic evidence of malignancy , density A  Return to clinic in 1 year for follow-up

## 2018-02-28 NOTE — Progress Notes (Signed)
Patient Care Team: Rita Ohara, MD as PCP - General (Family Medicine) Skeet Latch, MD as PCP - Cardiology (Cardiology) Himmelrich, Bryson Ha, RD (Inactive) as Dietitian [facility])  DIAGNOSIS:  Encounter Diagnosis  Name Primary?  . Atypical ductal hyperplasia of left breast     CHIEF COMPLIANT: Follow-up on tamoxifen therapy  INTERVAL HISTORY: Cindy Carey is a 62 year old with above-mentioned history of atypical ductal hyperplasia of the left breast who is here for follow-up on tamoxifen therapy.  Initially she had fogginess in the head related tamoxifen.  She appears to be doing much better.  She denies any lumps or nodules in the breast. Patient is trying to lose weight is in consideration for bariatric surgery.  REVIEW OF SYSTEMS:   Constitutional: Denies fevers, chills or abnormal weight loss Eyes: Denies blurriness of vision Ears, nose, mouth, throat, and face: Denies mucositis or sore throat Respiratory: Denies cough, dyspnea or wheezes Cardiovascular: Denies palpitation, chest discomfort Gastrointestinal:  Denies nausea, heartburn or change in bowel habits Skin: Denies abnormal skin rashes Lymphatics: Denies new lymphadenopathy or easy bruising Neurological:Denies numbness, tingling or new weaknesses Behavioral/Psych: Mood is stable, no new changes  Extremities: No lower extremity edema Breast:  denies any pain or lumps or nodules in either breasts All other systems were reviewed with the patient and are negative.  I have reviewed the past medical history, past surgical history, social history and family history with the patient and they are unchanged from previous note.  ALLERGIES:  is allergic to bee venom; shellfish allergy; and penicillins.  MEDICATIONS:  Current Outpatient Medications  Medication Sig Dispense Refill  . ACCU-CHEK FASTCLIX LANCETS MISC USE TO CHECK BLOOD SUGAR TWICE A DAY AS DIRECTED 102 each 3  . ACCU-CHEK GUIDE test strip USE TO CHECK  BLOOD SUGAR TWICE A DAY AS DIRECTED 100 each 3  . albuterol (PROVENTIL HFA;VENTOLIN HFA) 108 (90 BASE) MCG/ACT inhaler Inhale 2 puffs into the lungs every 6 (six) hours as needed for wheezing. Reported on 03/31/2016    . betamethasone valerate ointment (VALISONE) 0.1 % Apply 1 application topically 2 (two) times daily. 30 g 0  . Calcium Carbonate-Vit D-Min (CALCIUM 600+D PLUS MINERALS) 600-400 MG-UNIT TABS Take 1 tablet by mouth 2 (two) times daily.    . cyclobenzaprine (FLEXERIL) 10 MG tablet Take 1 tablet (10 mg total) by mouth 3 (three) times daily as needed for muscle spasms. 30 tablet 0  . Dulaglutide (TRULICITY) 1.5 UU/7.2ZD SOPN Inject 1.5 mg into the skin every 7 (seven) days. 12 pen 1  . EPINEPHrine 0.3 mg/0.3 mL IJ SOAJ injection Inject 0.3 mLs (0.3 mg total) into the muscle once as needed. Reported on 04/30/2016 1 Device 1  . lisinopril (PRINIVIL,ZESTRIL) 10 MG tablet Take 1 tablet (10 mg total) by mouth daily. 90 tablet 0  . Magnesium 250 MG TABS Take 250 mg by mouth daily.    . metFORMIN (GLUCOPHAGE-XR) 500 MG 24 hr tablet Take 2 tablets (1,000 mg total) by mouth 2 (two) times daily before a meal. 360 tablet 0  . METRONIDAZOLE, TOPICAL, 0.75 % LOTN Apply 1 application topically 2 (two) times daily. (Patient not taking: Reported on 01/18/2018) 59 mL 12  . Multiple Vitamins-Minerals (MULTIVITAMIN PO) Take 1 tablet by mouth daily. Reported on 03/31/2016    . nystatin cream (MYCOSTATIN) Apply 1 application topically 2 (two) times daily. Apply to affected area BID for up to 7 days. (Patient not taking: Reported on 01/18/2018) 30 g 0  . promethazine (PHENERGAN) 25  MG tablet Take 1 tablet (25 mg total) by mouth every 8 (eight) hours as needed for nausea. 30 tablet 0  . simvastatin (ZOCOR) 40 MG tablet Take 1 tablet (40 mg total) by mouth every evening. 90 tablet 0  . tamoxifen (NOLVADEX) 20 MG tablet Take 1 tablet (20 mg total) by mouth daily. 90 tablet 3  . valACYclovir (VALTREX) 1000 MG tablet Take  2 tablets together at onset of cold sore.  Repeat dose once in 12 hours (full course is 4 tabs) 40 tablet 0   No current facility-administered medications for this visit.     PHYSICAL EXAMINATION: ECOG PERFORMANCE STATUS: 1 - Symptomatic but completely ambulatory  Vitals:   03/01/18 0803  BP: (!) 124/59  Pulse: 81  Resp: 17  Temp: 97.9 F (36.6 C)  SpO2: 98%   Filed Weights   03/01/18 0803  Weight: 256 lb 3.2 oz (116.2 kg)    GENERAL:alert, no distress and comfortable SKIN: skin color, texture, turgor are normal, no rashes or significant lesions EYES: normal, Conjunctiva are pink and non-injected, sclera clear OROPHARYNX:no exudate, no erythema and lips, buccal mucosa, and tongue normal  NECK: supple, thyroid normal size, non-tender, without nodularity LYMPH:  no palpable lymphadenopathy in the cervical, axillary or inguinal LUNGS: clear to auscultation and percussion with normal breathing effort HEART: regular rate & rhythm and no murmurs and no lower extremity edema ABDOMEN:abdomen soft, non-tender and normal bowel sounds MUSCULOSKELETAL:no cyanosis of digits and no clubbing  NEURO: alert & oriented x 3 with fluent speech, no focal motor/sensory deficits EXTREMITIES: No lower extremity edema BREAST: No palpable masses or nodules in either right or left breasts. No palpable axillary supraclavicular or infraclavicular adenopathy no breast tenderness or nipple discharge. (exam performed in the presence of a chaperone)  LABORATORY DATA:  I have reviewed the data as listed CMP Latest Ref Rng & Units 01/16/2018 12/07/2017 09/30/2016  Glucose 65 - 99 mg/dL 109(H) 82 96  BUN 8 - 27 mg/dL 11 11 15   Creatinine 0.57 - 1.00 mg/dL 0.78 0.85 0.93  Sodium 134 - 144 mmol/L 139 138 137  Potassium 3.5 - 5.2 mmol/L 4.2 4.2 4.6  Chloride 96 - 106 mmol/L 102 101 104  CO2 20 - 29 mmol/L 21 22 23   Calcium 8.7 - 10.3 mg/dL 9.5 9.4 9.3  Total Protein 6.0 - 8.5 g/dL - 6.7 7.0  Total Bilirubin  0.0 - 1.2 mg/dL - 0.4 0.7  Alkaline Phos 39 - 117 IU/L - 74 55  AST 0 - 40 IU/L - 36 37(H)  ALT 0 - 32 IU/L - 20 26    Lab Results  Component Value Date   WBC 8.7 12/07/2017   HGB 14.1 12/07/2017   HCT 42.5 12/07/2017   MCV 93 12/07/2017   PLT 121 (L) 12/07/2017   NEUTROABS 4.1 12/07/2017    ASSESSMENT & PLAN:  Atypical ductal hyperplasia of left breast Left breast intraductal papilloma with atypical ductal hyperplasia diagnosed 07/15/2015 status post lumpectomy 07/23/2015  Prognosis: Based upon American Cancer Society breast cancer risk assessment tool, her five-year risk of breast cancer that 4.6%, and average woman's risk would be 1.9%: Her lifetime risk of breast cancer would be 25%, average risk would be 9.6%  Current treatment: risk lowering therapy with Tamoxifen 20 mg daily  Tamoxifen Toxicities:  Fogginess in the head and Lack of interest in planning on doing any activity have resolved  Breast Cancer Surveillance: 1. Breast exam 03/01/18: Benign 2. Mammogram 07/18/17: No mammographic  evidence of malignancy , density A  Obesity: Patient looking at bariatric surgery options Return to clinic in 1 year for follow-up  No orders of the defined types were placed in this encounter.  The patient has a good understanding of the overall plan. she agrees with it. she will call with any problems that may develop before the next visit here.   Harriette Ohara, MD 03/01/18

## 2018-03-01 ENCOUNTER — Inpatient Hospital Stay
Payer: No Typology Code available for payment source | Attending: Hematology and Oncology | Admitting: Hematology and Oncology

## 2018-03-01 ENCOUNTER — Telehealth: Payer: Self-pay | Admitting: Hematology and Oncology

## 2018-03-01 DIAGNOSIS — Z79899 Other long term (current) drug therapy: Secondary | ICD-10-CM | POA: Insufficient documentation

## 2018-03-01 DIAGNOSIS — Z79810 Long term (current) use of selective estrogen receptor modulators (SERMs): Secondary | ICD-10-CM | POA: Insufficient documentation

## 2018-03-01 DIAGNOSIS — E669 Obesity, unspecified: Secondary | ICD-10-CM | POA: Diagnosis not present

## 2018-03-01 DIAGNOSIS — N6092 Unspecified benign mammary dysplasia of left breast: Secondary | ICD-10-CM | POA: Insufficient documentation

## 2018-03-01 MED ORDER — TAMOXIFEN CITRATE 20 MG PO TABS: 20.0000 mg | ORAL_TABLET | Freq: Every day | ORAL | 3 refills | Status: DC

## 2018-03-01 NOTE — Telephone Encounter (Signed)
Gave patient AVs and calendar of upcoming may 2020 appointments °

## 2018-03-07 MED FILL — PANTOPRAZOLE SOD DR 40 MG T: 40 | 30 days supply | Qty: 30 | Fill #2

## 2018-03-14 MED FILL — ACCU-CHEK FASTCLIX LANCETS: 51 days supply | Qty: 102 | Fill #2

## 2018-03-14 MED FILL — metroNIDAZOLE 0.75 % LOTN: 0.75 | 20 days supply | Qty: 59 | Fill #3

## 2018-03-14 MED FILL — TRULICITY 1.5 MG/0.5 ML PEN: 1.5 | 28 days supply | Qty: 2 | Fill #3

## 2018-03-14 MED FILL — ACCU-CHEK GUIDE TEST STRIP: 51 days supply | Qty: 100 | Fill #2

## 2018-03-17 ENCOUNTER — Other Ambulatory Visit: Payer: Self-pay | Admitting: Family Medicine

## 2018-03-17 MED ORDER — METFORMIN HCL ER 500 MG PO TB24: 1000.0000 mg | ORAL_TABLET | Freq: Two times a day (BID) | ORAL | 0 refills | Status: DC

## 2018-03-17 MED FILL — METFORMIN HCL ER 500 MG TAB: 500 | 90 days supply | Qty: 360 | Fill #0

## 2018-03-21 ENCOUNTER — Other Ambulatory Visit: Payer: Self-pay | Admitting: Family Medicine

## 2018-03-21 DIAGNOSIS — I10 Essential (primary) hypertension: Secondary | ICD-10-CM

## 2018-03-21 MED ORDER — LISINOPRIL 10 MG PO TABS: 10.0000 mg | ORAL_TABLET | Freq: Every day | ORAL | 0 refills | Status: DC

## 2018-03-21 MED FILL — LISINOPRIL 10 MG TABLET: 10 | 90 days supply | Qty: 90 | Fill #0

## 2018-03-21 NOTE — Telephone Encounter (Signed)
Pt has an upcoming appt in june 

## 2018-03-29 ENCOUNTER — Ambulatory Visit: Payer: No Typology Code available for payment source | Admitting: Family Medicine

## 2018-04-04 ENCOUNTER — Ambulatory Visit: Payer: No Typology Code available for payment source | Admitting: Cardiovascular Disease

## 2018-04-04 ENCOUNTER — Encounter: Payer: Self-pay | Admitting: Cardiovascular Disease

## 2018-04-04 VITALS — BP 132/84 | HR 82 | Ht 63.0 in | Wt 262.2 lb

## 2018-04-04 DIAGNOSIS — I10 Essential (primary) hypertension: Secondary | ICD-10-CM

## 2018-04-04 DIAGNOSIS — I2510 Atherosclerotic heart disease of native coronary artery without angina pectoris: Secondary | ICD-10-CM | POA: Diagnosis not present

## 2018-04-04 DIAGNOSIS — E7800 Pure hypercholesterolemia, unspecified: Secondary | ICD-10-CM

## 2018-04-04 HISTORY — DX: Atherosclerotic heart disease of native coronary artery without angina pectoris: I25.10

## 2018-04-04 NOTE — Patient Instructions (Signed)
Medication Instructions:  START ASPIRIN 81 MG DAILY   Labwork: NONE  Testing/Procedures: NONE  Follow-Up: Your physician wants you to follow-up in: 1 YEAR You will receive a reminder letter in the mail two months in advance. If you don't receive a letter, please call our office to schedule the follow-up appointment.  If you need a refill on your cardiac medications before your next appointment, please call your pharmacy.

## 2018-04-04 NOTE — Progress Notes (Signed)
Cardiology Office Note   Date:  04/04/2018   ID:  Cindy Carey, Cindy Carey Aug 04, 1956, MRN [id number]  PCP:  Cindy Ohara, MD  Cardiologist:   Cindy Latch, MD   Chief Complaint  Patient presents with  . Follow-up    Pt states no Sx.       History of Present Illness: Cindy Carey is a 62 y.o. female with hypertension, hyperlipidemia, diabetes and morbid obesity here for follow up.  She was initially seen 07/2015 for evaluation of an abnormal EKG.  Cindy Carey was seen by anesthesia for pre-operative assessment was noted to have an incomplete bundle branch block.  Given that she has a strong family history of heart disease, she requested follow up with cardiology.  At that time she was feeling well and had no additional testing.  She followed up with Cindy Sims, DNP, on 12/2017 for pre-operative risk assessment prior to bariatric surgery.  Her mobility was limited due to knee pain.  She was referred for cardiac CT-A 01/18/18 that revealed a small amount of calcified plaque in the LAD but no obstructive CAD.  Her coronary calcium score was 1, which is 64th percentile.   Cindy Carey is in the process of considering bariatric surgery.  She is in a 24-month period of supervised weight loss.  She goes to [employer] several days per week and either rides the exercise bike or walks on the treadmill for 15 to 30 minutes.  She has no chest pain or shortness of breath with this activity.  She has pain in her right shoulder that limits her ability to do some of the exercise equipment.  She has no lower extremity edema, orthopnea, or PND.  She checks her blood pressure at home and at work.  It typically runs around 110-120/60-70.     Past Medical History:  Diagnosis Date  . Blood transfusion without reported diagnosis 1983  . Diabetes mellitus without complication ([place]) 9470   T2DM  . Dyspareunia   . Exercise-induced asthma    worse in winter  . H/O bone density study 2013    . H/O cold sores   . H/O colonoscopy 2009  . History of endometriosis   . Hyperlipidemia   . Hypertension   . Injury of tendon of rotator cuff 2015   gym injury (right)  . Knee pain 08/2015   L>R  . Migraine with typical aura   . Mild CAD 04/04/2018   Mild disease on coronary CT-A 01/2018.  . Morbid obesity ([place]) 09/04/12   BMI 49.4 kg/m^2     Past Surgical History:  Procedure Laterality Date  . BREAST BIOPSY  2010  . BREAST LUMPECTOMY  2010  . BREAST LUMPECTOMY WITH RADIOACTIVE SEED LOCALIZATION Left 07/23/2015   Procedure: LEFT BREAST SEED GUIDED EXCISION;  Surgeon: Rolm Bookbinder, MD;  Location: [facility];  Service: General;  Laterality: Left;  . BREATH TEK H PYLORI  08/28/2012   Procedure: BREATH TEK H PYLORI;  Surgeon: Pedro Earls, MD;  Location: [facility];  Service: General;  Laterality: N/A;  . CHOLECYSTECTOMY  2007  . EXPLORATORY LAPAROTOMY  1983   endometriosis  . GANGLION CYST EXCISION Left 1999   Foot  . TOTAL ABDOMINAL HYSTERECTOMY W/ BILATERAL SALPINGOOPHORECTOMY  1984   endometriosis (and appendectomy)     Current Outpatient Medications  Medication Sig Dispense Refill  . ACCU-CHEK FASTCLIX LANCETS MISC USE TO CHECK BLOOD SUGAR TWICE A DAY AS DIRECTED 102 each  3  . ACCU-CHEK GUIDE test strip USE TO CHECK BLOOD SUGAR TWICE A DAY AS DIRECTED 100 each 3  . albuterol (PROVENTIL HFA;VENTOLIN HFA) 108 (90 BASE) MCG/ACT inhaler Inhale 2 puffs into the lungs every 6 (six) hours as needed for wheezing. Reported on 03/31/2016    . aspirin EC 81 MG tablet Take 81 mg by mouth daily.    . betamethasone valerate ointment (VALISONE) 0.1 % Apply 1 application topically 2 (two) times daily. 30 g 0  . Calcium Carbonate-Vit D-Min (CALCIUM 600+D PLUS MINERALS) 600-400 MG-UNIT TABS Take 1 tablet by mouth 2 (two) times daily.    . cyclobenzaprine (FLEXERIL) 10 MG tablet Take 1 tablet (10 mg total) by mouth 3 (three) times daily as needed for muscle spasms. 30  tablet 0  . Dulaglutide (TRULICITY) 1.5 AC/1.6SA SOPN Inject 1.5 mg into the skin every 7 (seven) days. 12 pen 1  . EPINEPHrine 0.3 mg/0.3 mL IJ SOAJ injection Inject 0.3 mLs (0.3 mg total) into the muscle once as needed. Reported on 04/30/2016 1 Device 1  . lisinopril (PRINIVIL,ZESTRIL) 10 MG tablet Take 1 tablet (10 mg total) by mouth daily. 90 tablet 0  . Magnesium 250 MG TABS Take 250 mg by mouth daily.    . metFORMIN (GLUCOPHAGE-XR) 500 MG 24 hr tablet Take 2 tablets (1,000 mg total) by mouth 2 (two) times daily before a meal. 360 tablet 0  . METRONIDAZOLE, TOPICAL, 0.75 % LOTN Apply 1 application topically 2 (two) times daily. 59 mL 12  . Multiple Vitamins-Minerals (MULTIVITAMIN PO) Take 1 tablet by mouth daily. Reported on 03/31/2016    . nystatin cream (MYCOSTATIN) Apply 1 application topically 2 (two) times daily. Apply to affected area BID for up to 7 days. 30 g 0  . promethazine (PHENERGAN) 25 MG tablet Take 1 tablet (25 mg total) by mouth every 8 (eight) hours as needed for nausea. 30 tablet 0  . simvastatin (ZOCOR) 40 MG tablet Take 1 tablet (40 mg total) by mouth every evening. 90 tablet 0  . tamoxifen (NOLVADEX) 20 MG tablet Take 1 tablet (20 mg total) by mouth daily. 90 tablet 3  . valACYclovir (VALTREX) 1000 MG tablet Take 2 tablets together at onset of cold sore.  Repeat dose once in 12 hours (full course is 4 tabs) 40 tablet 0   No current facility-administered medications for this visit.     Allergies:   Bee venom; Shellfish allergy; and Penicillins    Social History:  The patient  reports that she has never smoked. She has never used smokeless tobacco. She reports that she does not drink alcohol or use drugs.   Family History:  The patient's family history includes Breast cancer in her cousin, cousin, and paternal aunt; Cancer in her maternal aunt, maternal grandfather, maternal uncle, and paternal grandmother; Congestive Heart Failure in her maternal uncle; Dementia in her  paternal grandmother; Diabetes in her father and paternal grandfather; Diverticulitis (age of onset: 52) in her mother; Heart attack (age of onset: 51) in her father; Heart disease in her father and paternal grandfather; Heart disease (age of onset: 39) in her mother; Heart failure in her maternal grandmother; Hypertension in her father and mother; Osteoporosis in her maternal grandmother; Peripheral Artery Disease in her father; Pneumonia in her sister; Thyroid disease in her mother.    ROS:  Please see the history of present illness.   Otherwise, review of systems are positive for none.   All other systems are reviewed and  negative.    PHYSICAL EXAM: VS:  BP 132/84   Pulse 82   Ht 5\' 3"  (1.6 m)   Wt 262 lb 3.2 oz (118.9 kg)   LMP 10/18/1982   BMI 46.45 kg/m  , BMI Body mass index is 46.45 kg/m. GENERAL:  Well appearing. Obese HEENT:  Pupils equal round and reactive, fundi not visualized, oral mucosa unremarkable NECK:  No jugular venous distention, waveform within normal limits, carotid upstroke brisk and symmetric, no bruits LUNGS:  Clear to auscultation bilaterally HEART:  RRR.  PMI not displaced or sustained,S1 and S2 within normal limits, no S3, no S4, no clicks, no rubs, no murmurs ABD:  Flat, positive bowel sounds normal in frequency in pitch, no bruits, no rebound, no guarding, no midline pulsatile mass, no hepatomegaly, no splenomegaly EXT:  2 plus pulses throughout, no edema, no cyanosis no clubbing SKIN:  No rashes no nodules NEURO:  Cranial nerves II through XII grossly intact, motor grossly intact throughout PSYCH:  Cognitively intact, oriented to person place and time  EKG:  EKG is not ordered today. The ekg ordered 07/18/15 shows sinus rhythm at 70 bpm.  Left anterior fascicular block. Incomplete right bundle branch block. Low voltage in the precordial leads.  Cardiac CT-A 01/18/18: IMPRESSION: 1. Coronary artery calcium score 1 Agatston unit, Placing the patient in the  64th percentile for age and gender. This suggests intermediate risk for future cardiac events.  2.  No obstructive coronary disease.   Recent Labs: 12/07/2017: ALT 20; Hemoglobin 14.1; Platelets 121; TSH 2.200 01/16/2018: BUN 11; Creatinine, Ser 0.78; Potassium 4.2; Sodium 139    Lipid Panel    Component Value Date/Time   CHOL 133 12/07/2017 1619   TRIG 69 12/07/2017 1619   HDL 71 12/07/2017 1619   CHOLHDL 1.9 12/07/2017 1619   CHOLHDL 2.3 09/30/2016 0811   VLDL 26 09/30/2016 0811   LDLCALC 48 12/07/2017 1619      Wt Readings from Last 3 Encounters:  04/04/18 262 lb 3.2 oz (118.9 kg)  03/01/18 256 lb 3.2 oz (116.2 kg)  12/26/17 260 lb (117.9 kg)      ASSESSMENT AND PLAN:  # Non-obstructive CAD: # Hyperlipidemia: Add aspirin 81mg  daily.  LDL was 48 on 11/2017.  Continue simvastatin and weight loss efforts.  # Pre-surgical risk assessment: Mild, non-obstructive CAD on cardiac CT-A.  She is at low risk for bariatric surgery.  # Hypertension: Blood pressure slightly above goal today.  She checks it at home and at work regularly and it is well-controlled.  She reports that she was nervous about the results of her CT scan.  She will continue to monitor it.  Continue lisinopril.  # Obesity: Working on weight loss.  Considering bariatric surgery.   Current medicines are reviewed at length with the patient today.  The patient does not have concerns regarding medicines.  The following changes have been made:  Start aspirin 81mg  daily.  Labs/ tests ordered today include:  No orders of the defined types were placed in this encounter.    Disposition:   FU with Nisaiah Bechtol C. Oval Linsey, MD in 1 year.    Signed, Cindy Latch, MD  04/04/2018 9:06 AM    [place]

## 2018-04-06 ENCOUNTER — Other Ambulatory Visit: Payer: Self-pay | Admitting: Obstetrics and Gynecology

## 2018-04-06 MED ORDER — NYSTATIN 100000 UNIT/GM EX CREA: 1.0000 "application " | TOPICAL_CREAM | Freq: Two times a day (BID) | CUTANEOUS | 0 refills | Status: DC

## 2018-04-06 MED FILL — TRULICITY 1.5 MG/0.5 ML PEN: 1.5 | 28 days supply | Qty: 2 | Fill #4

## 2018-04-06 MED FILL — metroNIDAZOLE 0.75 % LOTN: 0.75 | 20 days supply | Qty: 59 | Fill #4

## 2018-04-06 NOTE — Telephone Encounter (Signed)
Medication refill request: nystatin cream Last AEX:  09-21-17 Next AEX: 09-26-19 Last MMG (if hormonal medication request): 07-18-17 neg Refill authorized: lst given at [facility]ex 09-21-17. Please approve If appropriate

## 2018-04-07 MED FILL — NYSTATIN 100,000 UNIT/GM CR: 100000 | 7 days supply | Qty: 30 | Fill #0

## 2018-04-07 NOTE — Telephone Encounter (Signed)
rx was approved & sent to pharmacy by Dr Talbert Nan.

## 2018-04-12 NOTE — Progress Notes (Signed)
Chief Complaint  Patient presents with  . Consult    to discuss some weightloss options. Did do the entire process for bariatric surgery but would also like to discuss.    Diabetes follow-up: Medication was switched from [place] to [place] in February, and she continues on metformin.   Sugars have been running 90's before eating at night, 90's100's fasting.  Up to 140 randomly. Just slight constipation, no nausea, weight loss or other side effects.  She is in the [facility] program. Denies hypoglycemia, polydipsia, polyuria or side effects.  Eye exam is UTD, 09/2017, no retinopathy. She checks her feet regularly without concerns.  Vaginal itching improved after stopping Invokana.  Prior to 02/13/18 she was going to the gym 4-5 x/week, 30-40 minutes of treadmill and bike. Then her mother broke her hip--she hurt her back and hip staying at the hospital with her.  She has had some ongoing issues, though finally starting to improve.  It is hard to lift leg out of car, hurting at night in lower back, left hip. Improved only in the last week. Went to the gym earlier this week, pain recurred after 10 minutes. Aleve didn't help, tylenol helped with pain so she could sleep a little better.  Diet hasn't changed-- 1.5 egg with vegetables, cheese every day for breakfast [employer] carbmaster yogurt with a banana mid-morning Yogurt, cottage cheese, V8 juice and vegetables (carrots, radishes) Dinner: [place] sandwich, carrots or radishes.  Sugar-free popsicle.  Cooks more on weekends, more greens and vegetables Seafood  Today she reports being very frustrated with the process for getting bariatric surgery.  She was actually scheduled for surgery 4/30,  But then was told she didn't have 6 month supervised weight loss program, which would need to be completed before surgery. She is anxious about this, because she had everything else done (labs, upper GI, etc), and these studies are  only good for a year, may need to repeat what has been done so far. Frustrated that this wasn't recognized sooner. She is concerned about this, because [facility] weight loss clinic usually sees patients every 2 weeks, and she doesn't think she can make this work with her work/patient schedule.  She was seen here q3 mos with med management for obesity, but that was over a year ago.  No visits between 12/2016 and 11/2017.  She has seen nutritionist and psychologist as part of the process, as well as UGI, CXR, EKG. She also saw cardiologist, and had Cardiac CT: Cardiac CT-A 01/18/18: IMPRESSION: 1. Coronary artery calcium score 1 Agatston unit, Placing the patient in the 64th percentile for age and gender. This suggests intermediate risk for future cardiac events. 2. No obstructive coronary disease.  81 mg of aspirin daily was added to her medicine regimen.  Small HH was noted on UGI.  She never had heartburn, but she was having some hoarseness at her lat visit (progressively developing as the day went on).  She never had heartburn. She took a PPI from Psychologist, sport and exercise, which resolved her symptoms. Now taking OTC Pepcid and still doing well, no recurrent hoarseness or other reflux symptoms.  Hypertension follow-up: compliant with medications, BP's have been fine. Denies dizziness, headaches, chest pain. Denies side effects of medications. Some leg cramps, related to the shoes she wears.  Migraines--(has aura,green fuzzy vision change).One every 4-6 months, mild. Has usedibuprofen or aleve with good resultsfor milder headaches, sometimes requires phenergan also.  She stopped topamax due to hair thinning, which has improved.  No worsening of headaches since stopping the medication.  Hyperlipidemia follow-up: Patient is reportedly following a low-fat, low cholesterol diet. Compliant with medications and denies medication side effects. Lab Results  Component Value Date   CHOL 133 12/07/2017   HDL 71  12/07/2017   LDLCALC 48 12/07/2017   TRIG 69 12/07/2017   CHOLHDL 1.9 12/07/2017    Left breast intraductal papilloma with atypical ductal hyperplasia diagnosed 07/15/2015; status post lumpectomy 07/23/2015. Due to lifetime risk of breast cancer of 25%, she is on risk lowering therapy with Tamoxifen. She saw Dr. Lindi Adie last in 02/2018, and continues to tolerate tamoxifen well, without hot flashes or myalgias.  She had colonoscopy in May, and had hyperplastic polyp.  PMH, PSH, SH reviewed  Outpatient Encounter Medications as of 04/13/2018  Medication Sig Note  . ACCU-CHEK FASTCLIX LANCETS MISC USE TO CHECK BLOOD SUGAR TWICE A DAY AS DIRECTED   . ACCU-CHEK GUIDE test strip USE TO CHECK BLOOD SUGAR TWICE A DAY AS DIRECTED   . aspirin EC 81 MG tablet Take 81 mg by mouth daily.   . Calcium Carbonate-Vit D-Min (CALCIUM 600+D PLUS MINERALS) 600-400 MG-UNIT TABS Take 1 tablet by mouth 2 (two) times daily.   . Dulaglutide (TRULICITY) 1.5 BO/1.7PZ SOPN Inject 1.5 mg into the skin every 7 (seven) days.   [name] lisinopril (PRINIVIL,ZESTRIL) 10 MG tablet Take 1 tablet (10 mg total) by mouth daily.   . Magnesium 250 MG TABS Take 250 mg by mouth daily. 03/31/2016: Takes for migraine prevention  . metFORMIN (GLUCOPHAGE-XR) 500 MG 24 hr tablet Take 2 tablets (1,000 mg total) by mouth 2 (two) times daily before a meal.   . METRONIDAZOLE, TOPICAL, 0.75 % LOTN Apply 1 application topically 2 (two) times daily.   . Multiple Vitamins-Minerals (MULTIVITAMIN PO) Take 1 tablet by mouth daily. Reported on 03/31/2016   . nystatin cream (MYCOSTATIN) Apply 1 application topically 2 (two) times daily. Apply to affected area BID for up to 7 days.   . simvastatin (ZOCOR) 40 MG tablet Take 1 tablet (40 mg total) by mouth every evening.   . tamoxifen (NOLVADEX) 20 MG tablet Take 1 tablet (20 mg total) by mouth daily.   [name] albuterol (PROVENTIL HFA;VENTOLIN HFA) 108 (90 BASE) MCG/ACT inhaler Inhale 2 puffs into the lungs every 6  (six) hours as needed for wheezing. Reported on 03/31/2016   . betamethasone valerate ointment (VALISONE) 0.1 % Apply 1 application topically 2 (two) times daily. (Patient not taking: Reported on 04/13/2018)   . cyclobenzaprine (FLEXERIL) 10 MG tablet Take 1 tablet (10 mg total) by mouth 3 (three) times daily as needed for muscle spasms. (Patient not taking: Reported on 04/13/2018)   . promethazine (PHENERGAN) 25 MG tablet Take 1 tablet (25 mg total) by mouth every 8 (eight) hours as needed for nausea. (Patient not taking: Reported on 04/13/2018)   . valACYclovir (VALTREX) 1000 MG tablet Take 2 tablets together at onset of cold sore.  Repeat dose once in 12 hours (full course is 4 tabs) (Patient not taking: Reported on 04/13/2018)   . [DISCONTINUED] EPINEPHrine 0.3 mg/0.3 mL IJ SOAJ injection Inject 0.3 mLs (0.3 mg total) into the muscle once as needed. Reported on 04/30/2016 (Patient not taking: Reported on 04/13/2018)    No facility-administered encounter medications on file as of 04/13/2018.    Allergies  Allergen Reactions  . Bee Venom Anaphylaxis  . Shellfish Allergy Anaphylaxis  . Penicillins Hives   ROS: no fever, chills, headaches, dizziness, chest pain, shortness  of breath, palpitations.  Hoarseness resolved. Denies heartburn, abdominal pain, bowel changes, bleeding, bruising, rash. +back and leg pain since sleeping on hard bed in hospital in May, finally starting to improve. Denies numbness, tingling, weakness.  Reports being frustrated, no depression.  See HPI.    PHYSICAL EXAM:  BP 130/82   Pulse 76   Ht 5\' 3"  (1.6 m)   Wt 261 lb 9.6 oz (118.7 kg)   LMP 10/18/1982   BMI 46.34 kg/m   Wt Readings from Last 3 Encounters:  04/13/18 261 lb 9.6 oz (118.7 kg)  04/04/18 262 lb 3.2 oz (118.9 kg)  03/01/18 256 lb 3.2 oz (116.2 kg)   Well appearing, pleasant female, somewhat frustrated/irritable, in no distress HEENT: conjunctive and sclera are clear, EOMI, OP clear Neck: no  lymphadenopathy, thyromegaly or mass Heart: regular rate and rhythm Lungs: clear bilaterally Back: no spinal or CVA tenderness, no SI tenderness or muscle spasm Extremities: no edema, normal pulses.  Slightly tender at left trochanteric bursa, and slightly more diffusely at the lateral hip area Neuro: alert and oriented, cranial nerves intact, negative straight leg raise Psych: normal hygiene, grooming, eye contact, speech, affect. Skin: normal turgor, no rash   Lab Results  Component Value Date   HGBA1C 6.0 (A) 04/13/2018     ASSESSMENT/PLAN:  Diabetes mellitus without complication (HCC) - well controlled; continue current regimen - Plan: HgB A1c, Amb Ref to Medical Weight Management  Essential hypertension - controlled (slightly elevated above baseline today, somewhat agitated/frustrated) - Plan: Amb Ref to Medical Weight Management  Hyperlipidemia, unspecified hyperlipidemia type - at goal per last check, continue current regimen - Plan: Amb Ref to Medical Weight Management  Class 3 severe obesity due to excess calories with serious comorbidity and body mass index (BMI) of 45.0 to 49.9 in adult [facility]) - Plan: Amb Ref to Medical Weight Management  Low back pain without sciatica, unspecified back pain laterality, unspecified chronicity - and left hip pain; improving. Encouraged heat, stretching, core strengthening. Consider NSAID regularly x 1-2 wks. Slowly advance exercise, listen to body   With respect to her obesity and weight loss: She has not lost weight with change from invokana to Trulicity. Her diet appears to be good, and she gets regular exercise (until back pain in May).  We discussed monthly visits here to discuss diet, exercise, however given lack of response, I don't know that I have as much to offer as if she were being seen by a bariatric specialist.  We will put in referral, and see if they will be able to accommodate her schedule.   Veronica to [facility]  [place]--for generic epi autoinjector (she hasn't been able to get epi-pen filled, unavailable; going on a trip and needs one).  40-45 min visit, more than 1/2 spent counseling (diet, exercise, back/hip pain, next steps with weight loss, reasons to continue to work on weight loss options, etc)

## 2018-04-13 ENCOUNTER — Other Ambulatory Visit: Payer: Self-pay | Admitting: *Deleted

## 2018-04-13 ENCOUNTER — Ambulatory Visit (INDEPENDENT_AMBULATORY_CARE_PROVIDER_SITE_OTHER): Payer: No Typology Code available for payment source | Admitting: Family Medicine

## 2018-04-13 ENCOUNTER — Encounter: Payer: Self-pay | Admitting: Family Medicine

## 2018-04-13 VITALS — BP 130/82 | HR 76 | Ht 63.0 in | Wt 261.6 lb

## 2018-04-13 DIAGNOSIS — E119 Type 2 diabetes mellitus without complications: Secondary | ICD-10-CM | POA: Diagnosis not present

## 2018-04-13 DIAGNOSIS — M545 Low back pain, unspecified: Secondary | ICD-10-CM

## 2018-04-13 DIAGNOSIS — Z6841 Body Mass Index (BMI) 40.0 and over, adult: Secondary | ICD-10-CM

## 2018-04-13 DIAGNOSIS — E785 Hyperlipidemia, unspecified: Secondary | ICD-10-CM | POA: Diagnosis not present

## 2018-04-13 DIAGNOSIS — I10 Essential (primary) hypertension: Secondary | ICD-10-CM

## 2018-04-13 LAB — POCT GLYCOSYLATED HEMOGLOBIN (HGB A1C): Hemoglobin A1C: 6 % — AB (ref 4.0–5.6)

## 2018-04-13 MED ORDER — EPINEPHRINE 0.3 MG/0.3ML IJ SOAJ: 0.3000 mg | Freq: Once | INTRAMUSCULAR | 0 refills | Status: DC | PRN

## 2018-04-15 ENCOUNTER — Encounter: Payer: Self-pay | Admitting: Family Medicine

## 2018-04-17 ENCOUNTER — Encounter: Payer: Self-pay | Admitting: Family Medicine

## 2018-04-21 MED FILL — TAMOXIFEN CITRATE 20 MG TAB: 20 | 90 days supply | Qty: 90 | Fill #0

## 2018-05-08 ENCOUNTER — Other Ambulatory Visit: Payer: Self-pay | Admitting: Family Medicine

## 2018-05-08 ENCOUNTER — Encounter: Payer: Self-pay | Admitting: Hematology and Oncology

## 2018-05-08 MED ORDER — CYCLOBENZAPRINE HCL 10 MG PO TABS: 10.0000 mg | ORAL_TABLET | Freq: Three times a day (TID) | ORAL | 0 refills | Status: DC | PRN

## 2018-05-08 MED FILL — CYCLOBENZAPRINE 10 MG TAB: 10 | 10 days supply | Qty: 30 | Fill #0

## 2018-05-08 MED FILL — TRULICITY 1.5 MG/0.5 ML PEN: 1.5 | 28 days supply | Qty: 2 | Fill #5

## 2018-05-08 NOTE — Telephone Encounter (Signed)
Last filled 07/2017 #30. ok

## 2018-05-08 NOTE — Telephone Encounter (Signed)
Is this okay to refill? 

## 2018-05-23 NOTE — Progress Notes (Signed)
Chief Complaint  Patient presents with  . med check    med check    Patient presents for follow up on diabetes and obesity.  Diabetes: Currently on Trulicity (2.3/NT) and Metformin (2000mg /d), tolerating without side effects.  Sugars have been running 90's before eating at night, 90's-100's fasting.  Up to 140 randomly, nothing higher.  She is in the [facility].  Uses app; too cumbersome to enter the food, but does the blood sugars, med check. Gets free medication, discount on insurance. Denies hypoglycemia, polydipsia, polyuria or side effects.  Eye exam is UTD, 09/2017, no retinopathy. She checks her feet regularly without concerns. Lab Results  Component Value Date   HGBA1C 6.0 (A) 04/13/2018    On waiting list for appointment with [facility]  Typical die: 1.5 egg with vegetables, cheese every day for breakfast [employer] carbmaster yogurt with a banana mid-morning Switched to [place] sandwiches (artisan wheat bread) for lunch,vegetables (carrots, radishes), sometimes the low sodium V8 Dinner: chef salad, with ham/turkey, boiled egg. Sometimes cheese. Small amount vinaigrette dressing. No croutons.  Cooks more on weekends, more greens and vegetables Seafood  2 vacations/trips since last visit, but extremely active (one was in the mountains, lots of hiking).  Diet was different from the norm during those trips.  At her last visit, she had been bothered with back and hip pain, that had only just started to improve. She went on vacation mid-July, was much better. Very active on vacation (hiking), had no pain, felt much better. Now only has discomfort with stairs (when doing home visits), lateral hip pain. Still has some low back pain, tolerable.  Went back to treadmill at [employer], which bothers her back some, has to stop at 10 minutes. Then does 10 minutes on the bike (not recumbent), which also bothers her back some Exercise  less than her prior level due to the discomfort  PMH, PSH, SH reviewed  Outpatient Encounter Medications as of 05/24/2018  Medication Sig Note  . ACCU-CHEK FASTCLIX LANCETS MISC USE TO CHECK BLOOD SUGAR TWICE A DAY AS DIRECTED   . ACCU-CHEK GUIDE test strip USE TO CHECK BLOOD SUGAR TWICE A DAY AS DIRECTED   . aspirin EC 81 MG tablet Take 81 mg by mouth daily.   . betamethasone valerate ointment (VALISONE) 0.1 % Apply 1 application topically 2 (two) times daily.   . Calcium Carbonate-Vit D-Min (CALCIUM 600+D PLUS MINERALS) 600-400 MG-UNIT TABS Take 1 tablet by mouth 2 (two) times daily.   . cyclobenzaprine (FLEXERIL) 10 MG tablet Take 1 tablet (10 mg total) by mouth 3 (three) times daily as needed for muscle spasms.   . Dulaglutide (TRULICITY) 1.5 IR/4.4RX SOPN Inject 1.5 mg into the skin every 7 (seven) days.   [name] EPINEPHrine 0.3 mg/0.3 mL IJ SOAJ injection Inject 0.3 mLs (0.3 mg total) into the muscle once as needed. Reported on 04/30/2016   . lisinopril (PRINIVIL,ZESTRIL) 10 MG tablet Take 1 tablet (10 mg total) by mouth daily.   . Magnesium 250 MG TABS Take 250 mg by mouth daily. 03/31/2016: Takes for migraine prevention  . metFORMIN (GLUCOPHAGE-XR) 500 MG 24 hr tablet Take 2 tablets (1,000 mg total) by mouth 2 (two) times daily before a meal.   . METRONIDAZOLE, TOPICAL, 0.75 % LOTN Apply 1 application topically 2 (two) times daily.   . Multiple Vitamins-Minerals (MULTIVITAMIN PO) Take 1 tablet by mouth daily. Reported on 03/31/2016   . nystatin cream (MYCOSTATIN) Apply 1 application  topically 2 (two) times daily. Apply to affected area BID for up to 7 days.   . promethazine (PHENERGAN) 25 MG tablet Take 1 tablet (25 mg total) by mouth every 8 (eight) hours as needed for nausea.   . simvastatin (ZOCOR) 40 MG tablet Take 1 tablet (40 mg total) by mouth every evening.   . tamoxifen (NOLVADEX) 20 MG tablet Take 1 tablet (20 mg total) by mouth daily.   . valACYclovir (VALTREX) 1000 MG tablet Take 2  tablets together at onset of cold sore.  Repeat dose once in 12 hours (full course is 4 tabs)   . albuterol (PROVENTIL HFA;VENTOLIN HFA) 108 (90 BASE) MCG/ACT inhaler Inhale 2 puffs into the lungs every 6 (six) hours as needed for wheezing. Reported on 03/31/2016    No facility-administered encounter medications on file as of 05/24/2018.    Allergies  Allergen Reactions  . Bee Venom Anaphylaxis  . Shellfish Allergy Anaphylaxis  . Penicillins Hives   ROS: no fever, chills, headaches, dizziness, URI symptoms, chest pain, shortness of breath, polydipsia, polyuria, hypoglycemia, bowel changes, bleeding, bruising, rash. +back and left lateral hip pain per HPI, no other concerns,   PHYSICAL EXAM:  BP 120/84   Pulse 79   Temp 98.3 F (36.8 C) (Oral)   Resp 16   Ht 5\' 3"  (1.6 m)   Wt 263 lb 3.2 oz (119.4 kg)   LMP 10/18/1982   SpO2 96%   BMI 46.62 kg/m    Wt Readings from Last 3 Encounters:  05/24/18 263 lb 3.2 oz (119.4 kg)  04/13/18 261 lb 9.6 oz (118.7 kg)  04/04/18 262 lb 3.2 oz (118.9 kg)   Well appearing, pleasant, obese female in no distress Neck: no lymphadenopathy or mass Heart: regular rate and rhythm Lungs: clear bilaterally Back: no spinal or CVA tenderness.  Area of discomfort is over right lower lumbar paraspinous muscles, no spasm Extremities: no edema Neuro: alert and oriented, normal cranial nerves, gait Psych: normal mood, affect, hygiene and grooming  ASSESSMENT/PLAN:  Diabetes mellitus without complication ([place]) - well controlled; continue current med regimen  Class 3 severe obesity due to excess calories with serious comorbidity and body mass index (BMI) of 45.0 to 49.9 in adult [facility]) - counseled re: diet, exercise; some slight gain, recent travel and change in activity related to pain  Essential hypertension - well controlled  Right-sided low back pain without sciatica, unspecified chronicity - consider PT; discussed core strengthening, [employer]macare  patches. Try recumbent bike, discussed water aerobics   Back pain-- Consider physical therapy if limiting your ability to exercise. Consider using  thermacare patches.  F/u 1 month. Hopefully will get into weight loss clinic soon, to follow here monthly until then.

## 2018-05-24 ENCOUNTER — Ambulatory Visit (INDEPENDENT_AMBULATORY_CARE_PROVIDER_SITE_OTHER): Payer: No Typology Code available for payment source | Admitting: Family Medicine

## 2018-05-24 VITALS — BP 120/84 | HR 79 | Temp 98.3°F | Resp 16 | Ht 63.0 in | Wt 263.2 lb

## 2018-05-24 DIAGNOSIS — M545 Low back pain, unspecified: Secondary | ICD-10-CM

## 2018-05-24 DIAGNOSIS — Z6841 Body Mass Index (BMI) 40.0 and over, adult: Secondary | ICD-10-CM

## 2018-05-24 DIAGNOSIS — I10 Essential (primary) hypertension: Secondary | ICD-10-CM | POA: Diagnosis not present

## 2018-05-24 DIAGNOSIS — E119 Type 2 diabetes mellitus without complications: Secondary | ICD-10-CM | POA: Diagnosis not present

## 2018-05-24 NOTE — Patient Instructions (Signed)
  Back pain-- Consider physical therapy if limiting your ability to exercise. Consider using  thermacare patches.   Keep up the healthy diet, and work on getting back to normal exercise routine. Minimum 150 minutes of cardio each week. Consider the recumbent bike vs elliptical to see if this is better tolerated.  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

## 2018-05-26 MED FILL — metroNIDAZOLE 0.75 % LOTN: 0.75 | 20 days supply | Qty: 59 | Fill #5

## 2018-06-05 ENCOUNTER — Other Ambulatory Visit: Payer: Self-pay | Admitting: Obstetrics and Gynecology

## 2018-06-05 DIAGNOSIS — Z1231 Encounter for screening mammogram for malignant neoplasm of breast: Secondary | ICD-10-CM

## 2018-06-09 ENCOUNTER — Other Ambulatory Visit: Payer: Self-pay | Admitting: Family Medicine

## 2018-06-09 MED FILL — TRULICITY 1.5 MG/0.5 ML PEN: 1.5 | 84 days supply | Qty: 6 | Fill #0

## 2018-06-20 ENCOUNTER — Other Ambulatory Visit: Payer: Self-pay | Admitting: Family Medicine

## 2018-06-20 MED FILL — ACCU-CHEK FASTCLIX LANCETS: 51 days supply | Qty: 102 | Fill #3

## 2018-06-20 MED FILL — ACCU-CHEK GUIDE TEST STRIP: 50 days supply | Qty: 100 | Fill #3

## 2018-06-20 MED FILL — METFORMIN HCL ER 500 MG TAB: 500 | 90 days supply | Qty: 360 | Fill #0

## 2018-06-20 MED FILL — metroNIDAZOLE 0.75 % LOTN: 0.75 | 20 days supply | Qty: 59 | Fill #6

## 2018-06-22 ENCOUNTER — Telehealth: Payer: Self-pay | Admitting: *Deleted

## 2018-06-22 ENCOUNTER — Encounter: Payer: Self-pay | Admitting: Family Medicine

## 2018-06-22 NOTE — Telephone Encounter (Signed)
Patient has appt with [facility] on 07/04/18-ok per Dr.Knapp.

## 2018-06-27 NOTE — Progress Notes (Signed)
Chief Complaint  Patient presents with  . Diabetes    nonfasting med check for weight and diabetes. No new concerns.   . Flu Vaccine    will get flu shot at work.    Patient presents for follow up on diabetes and obesity. (monthly f/u required in process for bariatric surgery).  Diabetes: Currently on Trulicity (4.7/WG) and Metformin (2000mg /d), tolerating without side effects.  Sugars have been running90's before eating at night, 90's-100's fasting. Up to 140 randomly, nothing higher. She has had 2 episodes of hypoglycemia (53, 60), 2-4 hours after eating in the morning.  She is in the [employer] [facility].  Uses app; too cumbersome to enter the food, but does the blood sugars, med check. Gets free medication, discount on insurance. Denies hypoglycemia, polydipsia, polyuria or side effects. Eye exam is UTD, 09/2017, no retinopathy. She checks her feet regularly without concerns. Lab Results  Component Value Date   HGBA1C 6.0 (A) 04/13/2018   Patient has appt with [facility] on 07/04/18 (1 hour information session, then sees doctor 9/26).  Charting food intake for the last 4 weeks (portions not recorded, but using salad plate, watching her portions closely).  She reports that her back and hip pain are significantly better. She has some residual hip pain when walking stairs, but no persistent pain. At her last visit she was having back pain develop after being on treadmill at [employer] for 10 minutes. Then she was changing to do 10 minutes on the bike (not recumbent), which also bothers her back some. This has resolved, and she increased exercise back to 45 minutes of cardio at the gym (treadmill, bike, elliptical) Walks 1.5 miles with dog when husband is away, when she doesn't go to the gym.  She denies stress, anxiety. She reports being frustrated with the lack of weight loss despite doing what she is supposed to. Denies work stress.  Is a  little more anxious when her husband is OOT, doesn't sleep as well.   PMH, PSH, SH reviewed  Outpatient Encounter Medications as of 06/28/2018  Medication Sig Note  . ACCU-CHEK FASTCLIX LANCETS MISC USE TO CHECK BLOOD SUGAR TWICE A DAY AS DIRECTED   . ACCU-CHEK GUIDE test strip USE TO CHECK BLOOD SUGAR TWICE A DAY AS DIRECTED   . aspirin EC 81 MG tablet Take 81 mg by mouth daily.   . Calcium Carbonate-Vit D-Min (CALCIUM 600+D PLUS MINERALS) 600-400 MG-UNIT TABS Take 1 tablet by mouth 2 (two) times daily.   . cyclobenzaprine (FLEXERIL) 10 MG tablet Take 1 tablet (10 mg total) by mouth 3 (three) times daily as needed for muscle spasms.   [name] lisinopril (PRINIVIL,ZESTRIL) 10 MG tablet Take 1 tablet (10 mg total) by mouth daily.   . Magnesium 250 MG TABS Take 250 mg by mouth daily. 03/31/2016: Takes for migraine prevention  . metFORMIN (GLUCOPHAGE-XR) 500 MG 24 hr tablet TAKE 2 TABLETS BY MOUTH 2 TIMES DAILY BEFORE A MEAL.   [name] METRONIDAZOLE, TOPICAL, 0.75 % LOTN Apply 1 application topically 2 (two) times daily.   . Multiple Vitamins-Minerals (MULTIVITAMIN PO) Take 1 tablet by mouth daily. Reported on 03/31/2016   . nystatin cream (MYCOSTATIN) Apply 1 application topically 2 (two) times daily. Apply to affected area BID for up to 7 days.   . simvastatin (ZOCOR) 40 MG tablet Take 1 tablet (40 mg total) by mouth every evening.   . tamoxifen (NOLVADEX) 20 MG tablet Take 1 tablet (20 mg total)  by mouth daily.   . TRULICITY 1.5 BL/3.9QZ SOPN INJECT 1.5 MG INTO THE SKIN EVERY 7 DAYS.   [name] albuterol (PROVENTIL HFA;VENTOLIN HFA) 108 (90 BASE) MCG/ACT inhaler Inhale 2 puffs into the lungs every 6 (six) hours as needed for wheezing. Reported on 03/31/2016   . betamethasone valerate ointment (VALISONE) 0.1 % Apply 1 application topically 2 (two) times daily. (Patient not taking: Reported on 06/28/2018)   . EPINEPHrine 0.3 mg/0.3 mL IJ SOAJ injection Inject 0.3 mLs (0.3 mg total) into the muscle once as needed.  Reported on 04/30/2016 (Patient not taking: Reported on 06/28/2018)   . promethazine (PHENERGAN) 25 MG tablet Take 1 tablet (25 mg total) by mouth every 8 (eight) hours as needed for nausea. (Patient not taking: Reported on 06/28/2018)   . valACYclovir (VALTREX) 1000 MG tablet Take 2 tablets together at onset of cold sore.  Repeat dose once in 12 hours (full course is 4 tabs) (Patient not taking: Reported on 06/28/2018)    No facility-administered encounter medications on file as of 06/28/2018.    Allergies  Allergen Reactions  . Bee Venom Anaphylaxis  . Shellfish Allergy Anaphylaxis  . Penicillins Hives   ROS: no fever, chills, headaches, dizziness, chest pain, shortness of breath.  Some hypoglycemia per HPI.  Slight weight gain since last visit.  Denies edema. Denies bleeding, bruising, rash or other concerns. See HPI.   PHYSICAL EXAM:  BP 120/78   Pulse 76   Ht 5\' 3"  (1.6 m)   Wt 265 lb (120.2 kg)   LMP 10/18/1982   BMI 46.94 kg/m   Wt Readings from Last 3 Encounters:  06/28/18 265 lb (120.2 kg)  05/24/18 263 lb 3.2 oz (119.4 kg)  04/13/18 261 lb 9.6 oz (118.7 kg)    Well appearing, pleasant, obese female in no distress Neck: no lymphadenopathy or mass Heart: regular rate and rhythm Lungs: clear bilaterally Extremities: no edema Neuro: alert and oriented, normal cranial nerves, gait Psych: normal mood, affect, hygiene and grooming   ASSESSMENT/PLAN:  Diabetes mellitus without complication ([facility])  Class 3 severe obesity due to excess calories with serious comorbidity and body mass index (BMI) of 45.0 to 49.9 in adult [facility]) - counseled re: diet/exercise.  To try and vary intensity of workout, discussed HIIT. f/u with wt loss clinic as planned, document portions   Discussed varying workout at gym (intensity). Discussed documenting portion sizes on food log for when she meets with nutritionist.  F/u with weight loss clinic as planned. Check back with [facility]; unless weight  loss clinic can have significant success, best option for this patient is surgical approach. She is very compliant in diet, exercise, medications, and thus far has not been successful with weight loss with these and other measures.

## 2018-06-28 ENCOUNTER — Ambulatory Visit (INDEPENDENT_AMBULATORY_CARE_PROVIDER_SITE_OTHER): Payer: No Typology Code available for payment source | Admitting: Family Medicine

## 2018-06-28 ENCOUNTER — Encounter: Payer: Self-pay | Admitting: Family Medicine

## 2018-06-28 VITALS — BP 120/78 | HR 76 | Ht 63.0 in | Wt 265.0 lb

## 2018-06-28 DIAGNOSIS — Z6841 Body Mass Index (BMI) 40.0 and over, adult: Secondary | ICD-10-CM

## 2018-06-28 DIAGNOSIS — E119 Type 2 diabetes mellitus without complications: Secondary | ICD-10-CM | POA: Diagnosis not present

## 2018-07-04 ENCOUNTER — Encounter (INDEPENDENT_AMBULATORY_CARE_PROVIDER_SITE_OTHER): Payer: No Typology Code available for payment source

## 2018-07-05 ENCOUNTER — Encounter: Payer: Self-pay | Admitting: Family Medicine

## 2018-07-05 ENCOUNTER — Encounter: Payer: Self-pay | Admitting: *Deleted

## 2018-07-11 ENCOUNTER — Encounter (INDEPENDENT_AMBULATORY_CARE_PROVIDER_SITE_OTHER): Payer: Self-pay

## 2018-07-12 ENCOUNTER — Ambulatory Visit (INDEPENDENT_AMBULATORY_CARE_PROVIDER_SITE_OTHER): Payer: No Typology Code available for payment source | Admitting: Bariatrics

## 2018-07-13 ENCOUNTER — Ambulatory Visit (INDEPENDENT_AMBULATORY_CARE_PROVIDER_SITE_OTHER): Payer: No Typology Code available for payment source | Admitting: Bariatrics

## 2018-07-13 ENCOUNTER — Encounter (INDEPENDENT_AMBULATORY_CARE_PROVIDER_SITE_OTHER): Payer: Self-pay | Admitting: Bariatrics

## 2018-07-13 VITALS — BP 114/75 | HR 69 | Temp 97.6°F | Ht 63.0 in | Wt 262.0 lb

## 2018-07-13 DIAGNOSIS — E7849 Other hyperlipidemia: Secondary | ICD-10-CM | POA: Insufficient documentation

## 2018-07-13 DIAGNOSIS — J45909 Unspecified asthma, uncomplicated: Secondary | ICD-10-CM

## 2018-07-13 DIAGNOSIS — Z9189 Other specified personal risk factors, not elsewhere classified: Secondary | ICD-10-CM | POA: Diagnosis not present

## 2018-07-13 DIAGNOSIS — Z6841 Body Mass Index (BMI) 40.0 and over, adult: Secondary | ICD-10-CM

## 2018-07-13 DIAGNOSIS — R5383 Other fatigue: Secondary | ICD-10-CM | POA: Insufficient documentation

## 2018-07-13 DIAGNOSIS — E1169 Type 2 diabetes mellitus with other specified complication: Secondary | ICD-10-CM | POA: Insufficient documentation

## 2018-07-13 DIAGNOSIS — E119 Type 2 diabetes mellitus without complications: Secondary | ICD-10-CM | POA: Insufficient documentation

## 2018-07-13 DIAGNOSIS — Z1331 Encounter for screening for depression: Secondary | ICD-10-CM

## 2018-07-13 DIAGNOSIS — E559 Vitamin D deficiency, unspecified: Secondary | ICD-10-CM

## 2018-07-13 DIAGNOSIS — Z0289 Encounter for other administrative examinations: Secondary | ICD-10-CM

## 2018-07-13 NOTE — Progress Notes (Signed)
Office: [phone]  /  Fax: [phone]   Dear Dr. Tomi Bamberger,   Thank you for referring Cindy Carey to our clinic. The following note includes my evaluation and treatment recommendations.  HPI:   Chief Complaint: OBESITY    Cindy Carey has been referred by Cindy Ohara, MD for consultation regarding her obesity and obesity related comorbidities.    Cindy Carey (MR# [id number]) is a 62 y.o. female who presents on 07/13/2018 for obesity evaluation and treatment. Current BMI is Body mass index is 46.41 kg/m.[name] Cindy Carey has been struggling with her weight for many years and has been unsuccessful in either losing weight, maintaining weight loss, or reaching her healthy weight goal. Cindy Carey notes that she is craving snacks.     Cindy Carey attended our information session and states she is currently in the action stage of change and ready to dedicate time achieving and maintaining a healthier weight. Cindy Carey is interested in becoming our patient and working on intensive lifestyle modifications including (but not limited to) diet, exercise and weight loss.    Cindy Carey states her family eats meals together she thinks her family will eat healthier with  her her desired weight loss is 115 lbs she has been heavy most of  her life she started gaining weight at age 33 her heaviest weight ever was 280 lbs. she is a picky eater and doesn't like to eat healthier foods  she has significant food cravings issues  she snacks frequently in the evenings she is frequently drinking liquids with calories she frequently makes poor food choices she has problems with excessive hunger  she frequently eats larger portions than normal  she has binge eating behaviors she struggles with emotional eating    Fatigue Cindy Carey feels her energy is lower than it should be. This has worsened with weight gain and has not worsened recently. [name] admits to daytime somnolence and denies waking up still tired. Patient is at risk for  obstructive sleep apnea. Patent has a history of symptoms of daytime fatigue. Patient generally gets 7 or 8 hours of sleep per night, and states they generally have restful sleep. Snoring is not present. Apneic episodes are not present. Epworth Sleepiness Score is 1  Dyspnea on exertion Cindy Carey notes increasing shortness of breath with exercising and seems to be worsening over time with weight gain. She notes getting out of breath sooner with activity than she used to. This has not gotten worse recently.Cindy Carey denies chest pain or orthopnea.  Diabetes II Cindy Carey has a diagnosis of diabetes type II and she was diagnosed in 2010.Cindy Carey is on Metformin ER and Trulicity currently. [name] states fasting BGs range between 80 and 120 and 2 hour post prandial BGs range between 90 and 130's. She denies any hypoglycemic episodes. Last A1c was at 6.0 She has been working on intensive lifestyle modifications including diet, exercise, and weight loss to help control her blood glucose levels.  Hyperlipidemia Cindy Carey has hyperlipidemia and she is currently on Simvastatin. Her last lipid panel was on 12/07/17. Cindy Carey is attempting to improve her cholesterol levels with intensive lifestyle modification including a low saturated fat diet, exercise and weight loss. She denies any chest pain, claudication or myalgias.  Asthma Cindy Carey has a diagnosis of asthma and she uses her inhaler less than once per month.  Vitamin D deficiency Cindy Carey has a diagnosis of vitamin D deficiency. Cindy Carey is not currently taking vit D and she denies nausea, vomiting or muscle weakness.  At risk for  osteopenia and osteoporosis Cindy Carey is at higher risk of osteopenia and osteoporosis due to vitamin D deficiency.   Depression Screen Cindy Carey's Food and Mood (modified PHQ-9) score was  Depression screen PHQ 2/9 07/13/2018  Decreased Interest 3  Down, Depressed, Hopeless 0  PHQ - 2 Score 3  Altered sleeping 0  Tired, decreased energy 3  Change in appetite 0    Feeling bad or failure about yourself  1  Trouble concentrating 1  Moving slowly or fidgety/restless 1  Suicidal thoughts 0  PHQ-9 Score 9  Difficult doing work/chores Not difficult at all    ALLERGIES: Allergies  Allergen Reactions  . Bee Venom Anaphylaxis  . Shellfish Allergy Anaphylaxis  . Penicillins Hives    MEDICATIONS: Current Outpatient Medications on File Prior to Visit  Medication Sig Dispense Refill  . ACCU-CHEK FASTCLIX LANCETS MISC USE TO CHECK BLOOD SUGAR TWICE A DAY AS DIRECTED 102 each 3  . ACCU-CHEK GUIDE test strip USE TO CHECK BLOOD SUGAR TWICE A DAY AS DIRECTED 100 each 3  . albuterol (PROVENTIL HFA;VENTOLIN HFA) 108 (90 BASE) MCG/ACT inhaler Inhale 2 puffs into the lungs every 6 (six) hours as needed for wheezing. Reported on 03/31/2016    . aspirin EC 81 MG tablet Take 81 mg by mouth daily.    . Biotin 10000 MCG TABS Take by mouth.    . Calcium Carbonate-Vit D-Min (CALCIUM 600+D PLUS MINERALS) 600-400 MG-UNIT TABS Take 1 tablet by mouth 2 (two) times daily.    . cyclobenzaprine (FLEXERIL) 10 MG tablet Take 1 tablet (10 mg total) by mouth 3 (three) times daily as needed for muscle spasms. 30 tablet 0  . EPINEPHrine 0.3 mg/0.3 mL IJ SOAJ injection Inject 0.3 mLs (0.3 mg total) into the muscle once as needed. Reported on 04/30/2016 1 Device 0  . lisinopril (PRINIVIL,ZESTRIL) 10 MG tablet Take 1 tablet (10 mg total) by mouth daily. 90 tablet 0  . Magnesium 250 MG TABS Take 250 mg by mouth daily.    . metFORMIN (GLUCOPHAGE-XR) 500 MG 24 hr tablet TAKE 2 TABLETS BY MOUTH 2 TIMES DAILY BEFORE A MEAL. 360 tablet 0  . METRONIDAZOLE, TOPICAL, 0.75 % LOTN Apply 1 application topically 2 (two) times daily. 59 mL 12  . Multiple Vitamins-Minerals (MULTIVITAMIN PO) Take 1 tablet by mouth daily. Reported on 03/31/2016    . promethazine (PHENERGAN) 25 MG tablet Take 1 tablet (25 mg total) by mouth every 8 (eight) hours as needed for nausea. 30 tablet 0  . simvastatin (ZOCOR) 40  MG tablet Take 1 tablet (40 mg total) by mouth every evening. 90 tablet 0  . tamoxifen (NOLVADEX) 20 MG tablet Take 1 tablet (20 mg total) by mouth daily. 90 tablet 3  . TRULICITY 1.5 AJ/2.8NO SOPN INJECT 1.5 MG INTO THE SKIN EVERY 7 DAYS. 6 mL 0   No current facility-administered medications on file prior to visit.     PAST MEDICAL HISTORY: Past Medical History:  Diagnosis Date  . Anemia   . Asthma   . Blood transfusion without reported diagnosis 1983  . Diabetes mellitus without complication ([place]) 6767   T2DM  . Dyspareunia   . Exercise-induced asthma    worse in winter  . H/O bone density study 2013  . H/O cold sores   . H/O colonoscopy 2009  . History of endometriosis   . Hyperlipidemia   . Hypertension   . Injury of tendon of rotator cuff 2015   gym injury (right)  . Knee  pain 08/2015   L>R  . Migraine with typical aura   . Mild CAD 04/04/2018   Mild disease on coronary CT-A 01/2018.  . Morbid obesity ([place]) 09/04/12   BMI 49.4 kg/m^2   . Osteoarthritis     PAST SURGICAL HISTORY: Past Surgical History:  Procedure Laterality Date  . BREAST BIOPSY  2010  . BREAST LUMPECTOMY  2010  . BREAST LUMPECTOMY WITH RADIOACTIVE SEED LOCALIZATION Left 07/23/2015   Procedure: LEFT BREAST SEED GUIDED EXCISION;  Surgeon: Rolm Bookbinder, MD;  Location: [facility];  Service: General;  Laterality: Left;  . BREATH TEK H PYLORI  08/28/2012   Procedure: BREATH TEK H PYLORI;  Surgeon: Pedro Earls, MD;  Location: [facility];  Service: General;  Laterality: N/A;  . CHOLECYSTECTOMY  2007  . EXPLORATORY LAPAROTOMY  1983   endometriosis  . GANGLION CYST EXCISION Left 1999   Foot  . TOTAL ABDOMINAL HYSTERECTOMY W/ BILATERAL SALPINGOOPHORECTOMY  1984   endometriosis (and appendectomy)    SOCIAL HISTORY: Social History   Tobacco Use  . Smoking status: Never Smoker  . Smokeless tobacco: Never Used  Substance Use Topics  . Alcohol use: No  . Drug use: No     FAMILY HISTORY: Family History  Problem Relation Age of Onset  . Diabetes Father   . Heart attack Father 35  . Heart disease Father   . Hypertension Father   . Peripheral Artery Disease Father        stents at 70  . Diabetes Paternal Grandfather   . Heart disease Paternal Grandfather   . Osteoporosis Maternal Grandmother   . Heart failure Maternal Grandmother   . Thyroid disease Mother        hypothyroidism  . Heart disease Mother 22       CABG at 46  . Hypertension Mother   . Diverticulitis Mother 26       perforation, required surgery  . Pneumonia Sister   . Cancer Maternal Grandfather        lung  . Dementia Paternal Grandmother   . Cancer Paternal Grandmother        melanoma on her foot (not related to death)  . Breast cancer Paternal Aunt        32's  . Cancer Maternal Aunt        lung  . Cancer Maternal Uncle        lung  . Breast cancer Cousin        mets to lung  . Congestive Heart Failure Maternal Uncle   . Breast cancer Cousin     ROS: Review of Systems  Constitutional: Positive for malaise/fatigue.  Eyes:       + Wear Glasses (reading only)  Respiratory: Positive for shortness of breath (on exertion).   Cardiovascular: Negative for chest pain, orthopnea and claudication.       + Leg Cramping  Gastrointestinal: Negative for nausea and vomiting.  Musculoskeletal: Positive for back pain. Negative for myalgias.       + Muscle or Joint Pain Negative for muscle weakness  Neurological: Positive for headaches.  Endo/Heme/Allergies:       Positive for cravings    PHYSICAL EXAM: Blood pressure 114/75, pulse 69, temperature 97.6 F (36.4 C), temperature source Oral, height 5\' 3"  (1.6 m), weight 262 lb (118.8 kg), last menstrual period 10/18/1982, SpO2 96 %. Body mass index is 46.41 kg/m. Physical Exam  Constitutional: She is oriented to person, place, and time. She  appears well-developed and well-nourished.  HENT:  Head: Normocephalic and  atraumatic.  Nose: Nose normal.  M[place]anpati = 4  Eyes: EOM are normal. No scleral icterus.  Neck: Normal range of motion. Neck supple. No thyromegaly present.  Cardiovascular: Normal rate and regular rhythm.  Pulmonary/Chest: Effort normal. No respiratory distress.  Abdominal: Soft. There is no tenderness.  + Obesity  Musculoskeletal: Normal range of motion.  Range of Motion normal in all 4 extremities  Neurological: She is alert and oriented to person, place, and time. Coordination normal.  Skin: Skin is warm and dry.  Psychiatric: She has a normal mood and affect. Her behavior is normal.  Vitals reviewed.   RECENT LABS AND TESTS: BMET    Component Value Date/Time   NA 139 01/16/2018 0726   NA 139 11/21/2015 0826   K 4.2 01/16/2018 0726   K 4.7 11/21/2015 0826   CL 102 01/16/2018 0726   CO2 21 01/16/2018 0726   CO2 24 11/21/2015 0826   GLUCOSE 109 (H) 01/16/2018 0726   GLUCOSE 96 09/30/2016 0811   GLUCOSE 125 11/21/2015 0826   BUN 11 01/16/2018 0726   BUN 12.5 11/21/2015 0826   CREATININE 0.78 01/16/2018 0726   CREATININE 0.93 09/30/2016 0811   CREATININE 1.0 11/21/2015 0826   CALCIUM 9.5 01/16/2018 0726   CALCIUM 9.6 11/21/2015 0826   GFRNONAA 83 01/16/2018 0726   GFRAA 96 01/16/2018 0726   Lab Results  Component Value Date   HGBA1C 6.0 (A) 04/13/2018   No results found for: INSULIN CBC    Component Value Date/Time   WBC 8.7 12/07/2017 1619   WBC 7.8 09/30/2016 0811   RBC 4.57 12/07/2017 1619   RBC 4.92 09/30/2016 0811   HGB 14.1 12/07/2017 1619   HGB 15.1 11/21/2015 0826   HCT 42.5 12/07/2017 1619   HCT 46.3 11/21/2015 0826   PLT 121 (L) 12/07/2017 1619   MCV 93 12/07/2017 1619   MCV 95.1 11/21/2015 0826   MCH 30.9 12/07/2017 1619   MCH 32.3 09/30/2016 0811   MCHC 33.2 12/07/2017 1619   MCHC 33.1 09/30/2016 0811   RDW 14.3 12/07/2017 1619   RDW 15.1 (H) 11/21/2015 0826   LYMPHSABS 3.5 (H) 12/07/2017 1619   LYMPHSABS 2.6 11/21/2015 0826   MONOABS  546 09/30/2016 0811   MONOABS 0.7 11/21/2015 0826   EOSABS 0.2 12/07/2017 1619   BASOSABS 0.0 12/07/2017 1619   BASOSABS 0.0 11/21/2015 0826   Iron/TIBC/Ferritin/ %Sat    Component Value Date/Time   IRON 84 12/07/2017 1619   Lipid Panel     Component Value Date/Time   CHOL 133 12/07/2017 1619   TRIG 69 12/07/2017 1619   HDL 71 12/07/2017 1619   CHOLHDL 1.9 12/07/2017 1619   CHOLHDL 2.3 09/30/2016 0811   VLDL 26 09/30/2016 0811   LDLCALC 48 12/07/2017 1619   Hepatic Function Panel     Component Value Date/Time   PROT 6.7 12/07/2017 1619   PROT 7.2 11/21/2015 0826   ALBUMIN 4.0 12/07/2017 1619   ALBUMIN 3.8 11/21/2015 0826   AST 36 12/07/2017 1619   AST 24 11/21/2015 0826   ALT 20 12/07/2017 1619   ALT 24 11/21/2015 0826   ALKPHOS 74 12/07/2017 1619   ALKPHOS 75 11/21/2015 0826   BILITOT 0.4 12/07/2017 1619   BILITOT 0.58 11/21/2015 0826      Component Value Date/Time   TSH 2.200 12/07/2017 1619   TSH 1.79 03/31/2016 0859   TSH 2.711 09/06/2012 0741  Vitamin D Results for ELIA, NUNLEY (MRN [id number]) as of 07/13/2018 16:55  Ref. Range 03/31/2016 08:59  Vitamin D, 25-Hydroxy Latest Ref Range: 30 - 100 ng/mL 41    ECG  shows NSR with a rate of 74 BPM INDIRECT CALORIMETER done today shows a VO2 of 250 and a REE of 1737.  Her calculated basal metabolic rate is 0626 thus her basal metabolic rate is worse than expected.    ASSESSMENT AND PLAN: Other fatigue - Plan: EKG 12-Lead, Vitamin B12, Folate  Asthma, unspecified asthma severity, unspecified whether complicated, unspecified whether persistent  Other hyperlipidemia  Type 2 diabetes mellitus without complication, without long-term current use of insulin ([place]) - Plan: Comprehensive metabolic panel, Hemoglobin A1c, Insulin, random  Vitamin D deficiency - Plan: VITAMIN D 25 Hydroxy (Vit-D Deficiency, Fractures)  Depression screening  At risk for osteoporosis  Class 3 severe obesity with serious  comorbidity and body mass index (BMI) of 45.0 to 49.9 in adult, unspecified obesity type ([place])  PLAN: Fatigue Marquia was informed that her fatigue may be related to obesity, depression or many other causes. Labs will be ordered, and in the meanwhile Almee has agreed to work on diet and weight loss to help with fatigue and she will gradually begin exercise. Proper sleep hygiene was discussed including the need for 7-8 hours of quality sleep each night. A sleep study was not ordered based on symptoms and Epworth score.  Dyspnea on exertion Cindy Carey's shortness of breath appears to be obesity related and exercise induced. She has agreed to work on weight loss and gradually increase exercise to treat her exercise induced shortness of breath. If Laquinta follows our instructions and loses weight without improvement of her shortness of breath, we will plan to refer to pulmonology. We will monitor this condition regularly. Ayza agrees to this plan.  Diabetes II Cindy Carey has been given extensive diabetes education by myself today including ideal fasting and post-prandial blood glucose readings, individual ideal Hgb A1c goals and hypoglycemia prevention. We discussed the importance of good blood sugar control to decrease the likelihood of diabetic complications such as nephropathy, neuropathy, limb loss, blindness, coronary artery disease, and death. We discussed the importance of intensive lifestyle modification including diet, exercise and weight loss as the first line treatment for diabetes. Vidalia agrees to continue Metformin and Trulicity and will follow up at the agreed upon time.  Hyperlipidemia Nhung was informed of the American Heart Association Guidelines emphasizing intensive lifestyle modifications as the first line treatment for hyperlipidemia. We discussed many lifestyle modifications today in depth, and Cindy Carey will work on decreasing saturated fats such as fatty red meat, butter and many fried foods. She will also  increase vegetables and lean protein in her diet and will work on exercise and weight loss efforts. Cindy Carey will continue Simvastatin and follow up at the agreed upon time.  Asthma We discussed strategies to prevent asthma exacerbations today. Cindy Carey will follow up with our clinic in 2 weeks.  Vitamin D Deficiency Cindy Carey was informed that low vitamin D levels contributes to fatigue and are associated with obesity, breast, and colon cancer. We will check vitamin D level and begin vitamin D if needed. She will follow up for routine testing of vitamin D, at least 2-3 times per year.   At risk for osteopenia and osteoporosis Cindy Carey was given extended  (15 minutes) osteoporosis prevention counseling today. Cindy Carey is at risk for osteopenia and osteoporosis due to her vitamin D deficiency. She was encouraged to take  her vitamin D and follow her higher calcium diet and increase strengthening exercise to help strengthen her bones and decrease her risk of osteopenia and osteoporosis.  Depression Screen Cindy Carey had a mildly positive depression screening. Depression is commonly associated with obesity and often results in emotional eating behaviors. We will monitor this closely and work on CBT to help improve the non-hunger eating patterns. Referral to Psychology may be required if no improvement is seen as she continues in our clinic.  Obesity Cindy Carey is currently in the action stage of change and her goal is to continue with weight loss efforts. I recommend Cindy Carey begin the structured treatment plan as follows:  She has agreed to follow the modified Category 2 plan +100 calories Shavonn has been instructed to eventually work up to a goal of 150 minutes of combined cardio and strengthening exercise per week for weight loss and overall health benefits. We discussed the following Behavioral Modification Strategies today: increase H2O intake, keeping healthy foods in the home, no skipping meals, better snacking choices, increasing  lean protein intake, decreasing simple carbohydrates , increasing vegetables and work on meal planning and easy cooking plans   She was informed of the importance of frequent follow up visits to maximize her success with intensive lifestyle modifications for her multiple health conditions. She was informed we would discuss her lab results at her next visit unless there is a critical issue that needs to be addressed sooner. Kawanda agreed to keep her next visit at the agreed upon time to discuss these results.    OBESITY BEHAVIORAL INTERVENTION VISIT  Today's visit was # 1   Starting weight: 262 lbs Starting date: 07/13/18 Today's weight : 262 lbs  Today's date: 07/13/2018 Total lbs lost to date: 0    ASK: We discussed the diagnosis of obesity with Cindy Carey today and Maylen agreed to give [name] permission to discuss obesity behavioral modification therapy today.  ASSESS: Khandi has the diagnosis of obesity and her BMI today is 37.42 Allyssia is in the action stage of change   ADVISE: Pearl was educated on the multiple health risks of obesity as well as the benefit of weight loss to improve her health. She was advised of the need for long term treatment and the importance of lifestyle modifications to improve her current health and to decrease her risk of future health problems.  AGREE: Multiple dietary modification options and treatment options were discussed and  Johnette agreed to follow the recommendations documented in the above note.  ARRANGE: Kynzi was educated on the importance of frequent visits to treat obesity as outlined per CMS and USPSTF guidelines and agreed to schedule her next follow up appointment today.  Corey Skains, am acting as Location manager for [employer]. Owens Shark, DO  I have reviewed the above documentation for accuracy and completeness, and I agree with the above. -Jearld Lesch, DO

## 2018-07-14 LAB — VITAMIN B12: VITAMIN B 12: 354 pg/mL (ref 232–1245)

## 2018-07-14 LAB — VITAMIN D 25 HYDROXY (VIT D DEFICIENCY, FRACTURES): Vit D, 25-Hydroxy: 50 ng/mL (ref 30.0–100.0)

## 2018-07-14 LAB — COMPREHENSIVE METABOLIC PANEL
A/G RATIO: 1.6 (ref 1.2–2.2)
ALBUMIN: 4 g/dL (ref 3.6–4.8)
ALT: 24 IU/L (ref 0–32)
AST: 51 IU/L — ABNORMAL HIGH (ref 0–40)
Alkaline Phosphatase: 93 IU/L (ref 39–117)
BUN / CREAT RATIO: 14 (ref 12–28)
BUN: 12 mg/dL (ref 8–27)
Bilirubin Total: 0.4 mg/dL (ref 0.0–1.2)
CO2: 18 mmol/L — ABNORMAL LOW (ref 20–29)
Calcium: 9.2 mg/dL (ref 8.7–10.3)
Chloride: 102 mmol/L (ref 96–106)
Creatinine, Ser: 0.83 mg/dL (ref 0.57–1.00)
GFR, EST AFRICAN AMERICAN: 89 mL/min/{1.73_m2} (ref >59)
GFR, EST NON AFRICAN AMERICAN: 77 mL/min/{1.73_m2} (ref >59)
GLOBULIN, TOTAL: 2.5 g/dL (ref 1.5–4.5)
Glucose: 113 mg/dL — ABNORMAL HIGH (ref 65–99)
Potassium: 4.5 mmol/L (ref 3.5–5.2)
SODIUM: 139 mmol/L (ref 134–144)
Total Protein: 6.5 g/dL (ref 6.0–8.5)

## 2018-07-14 LAB — HEMOGLOBIN A1C
Est. average glucose Bld gHb Est-mCnc: 154 mg/dL
Hgb A1c MFr Bld: 7 % — ABNORMAL HIGH (ref 4.8–5.6)

## 2018-07-14 LAB — INSULIN, RANDOM: INSULIN: 59.1 u[IU]/mL — ABNORMAL HIGH (ref 2.6–24.9)

## 2018-07-14 LAB — FOLATE

## 2018-07-17 ENCOUNTER — Encounter (INDEPENDENT_AMBULATORY_CARE_PROVIDER_SITE_OTHER): Payer: Self-pay | Admitting: Bariatrics

## 2018-07-17 ENCOUNTER — Other Ambulatory Visit: Payer: Self-pay | Admitting: Family Medicine

## 2018-07-17 DIAGNOSIS — I10 Essential (primary) hypertension: Secondary | ICD-10-CM

## 2018-07-17 MED ORDER — LISINOPRIL 10 MG PO TABS: 10.0000 mg | ORAL_TABLET | Freq: Every day | ORAL | 0 refills | Status: DC

## 2018-07-17 MED FILL — metroNIDAZOLE 0.75 % LOTN: 0.75 | 20 days supply | Qty: 59 | Fill #7

## 2018-07-17 MED FILL — TAMOXIFEN CITRATE 20 MG TAB: 20 | 90 days supply | Qty: 90 | Fill #1

## 2018-07-17 MED FILL — LISINOPRIL 10 MG TABLET: 10 | 90 days supply | Qty: 90 | Fill #0

## 2018-07-20 ENCOUNTER — Ambulatory Visit: Payer: No Typology Code available for payment source | Admitting: Family Medicine

## 2018-07-20 ENCOUNTER — Ambulatory Visit
Admission: RE | Admit: 2018-07-20 | Discharge: 2018-07-20 | Disposition: A | Discharge status: Home / Self Care | Payer: No Typology Code available for payment source | Source: Ambulatory Visit | Attending: Obstetrics and Gynecology | Admitting: Obstetrics and Gynecology

## 2018-07-20 DIAGNOSIS — Z1231 Encounter for screening mammogram for malignant neoplasm of breast: Secondary | ICD-10-CM

## 2018-07-24 ENCOUNTER — Ambulatory Visit (INDEPENDENT_AMBULATORY_CARE_PROVIDER_SITE_OTHER): Payer: No Typology Code available for payment source | Admitting: Family Medicine

## 2018-07-24 ENCOUNTER — Encounter: Payer: Self-pay | Admitting: Family Medicine

## 2018-07-24 VITALS — BP 122/82 | HR 84 | Ht 63.0 in | Wt 267.2 lb

## 2018-07-24 DIAGNOSIS — M25572 Pain in left ankle and joints of left foot: Secondary | ICD-10-CM | POA: Diagnosis not present

## 2018-07-24 MED ORDER — MELOXICAM 15 MG PO TABS: 7.5000 mg | ORAL_TABLET | Freq: Every day | ORAL | 0 refills | Status: DC

## 2018-07-24 MED FILL — MELOXICAM 15 MG TABLET: 15 | 15 days supply | Qty: 15 | Fill #0

## 2018-07-24 NOTE — Progress Notes (Signed)
Chief Complaint  Patient presents with  . Ankle Pain    Sept 1st she rolled her ankle in a gravel parking lot. Has continuing to have pain off and on -inner left ankle/foot area that is sensitive to touch (she even has to take her sock and shoes it is so sensitive to touch).     She injured her ankle on 9/1--she was walking on gravel in slight heels, and the left ankle turned inward. She is having pain at the left medial foot, along the calcaneous/heel. The ankle had swelled a lot, was purple, but that resolved.  Overall, the ankle feels fine now, but has random onset of severe pain. When the pain is present, she describes it as seering, like a hot needle pain, in that limited area along her medial calcaneous, comes/goes. When it hurts, she cannot tolerate any pressure on it (has to take off shoe, can't tolerate ice).  H/o many tendon tears in ankles, this feels different.  Pain has occurred while resting--last night while watching football on TV.  Resolved after 15 mins, enough to be able to ice it.  It has also happened walking. She tried various shoes, no effect (when wearing tennis shoes, had to take them off due to pain).  Capsaicin cream helped some.  PMH, PSH, SH reviewed.  Discussed her visit with weight loss clinic, has f/u later this week. She is unable to eat the amount of protein (grilled chicken) for dinner that was recommended, plans to discuss with them. She doesn't really like meat.  Current Outpatient Medications on File Prior to Visit  Medication Sig Dispense Refill  . ACCU-CHEK FASTCLIX LANCETS MISC USE TO CHECK BLOOD SUGAR TWICE A DAY AS DIRECTED 102 each 3  . ACCU-CHEK GUIDE test strip USE TO CHECK BLOOD SUGAR TWICE A DAY AS DIRECTED 100 each 3  . aspirin EC 81 MG tablet Take 81 mg by mouth daily.    . Biotin 10000 MCG TABS Take by mouth.    . Calcium Carbonate-Vit D-Min (CALCIUM 600+D PLUS MINERALS) 600-400 MG-UNIT TABS Take 1 tablet by mouth 2 (two) times daily.     [name] lisinopril (PRINIVIL,ZESTRIL) 10 MG tablet Take 1 tablet (10 mg total) by mouth daily. 90 tablet 0  . Magnesium 250 MG TABS Take 250 mg by mouth daily.    . metFORMIN (GLUCOPHAGE-XR) 500 MG 24 hr tablet TAKE 2 TABLETS BY MOUTH 2 TIMES DAILY BEFORE A MEAL. 360 tablet 0  . Multiple Vitamins-Minerals (MULTIVITAMIN PO) Take 1 tablet by mouth daily. Reported on 03/31/2016    . simvastatin (ZOCOR) 40 MG tablet Take 1 tablet (40 mg total) by mouth every evening. 90 tablet 0  . tamoxifen (NOLVADEX) 20 MG tablet Take 1 tablet (20 mg total) by mouth daily. 90 tablet 3  . TRULICITY 1.5 CB/7.6EG SOPN INJECT 1.5 MG INTO THE SKIN EVERY 7 DAYS. 6 mL 0  . albuterol (PROVENTIL HFA;VENTOLIN HFA) 108 (90 BASE) MCG/ACT inhaler Inhale 2 puffs into the lungs every 6 (six) hours as needed for wheezing. Reported on 03/31/2016    . cyclobenzaprine (FLEXERIL) 10 MG tablet Take 1 tablet (10 mg total) by mouth 3 (three) times daily as needed for muscle spasms. (Patient not taking: Reported on 07/24/2018) 30 tablet 0  . EPINEPHrine 0.3 mg/0.3 mL IJ SOAJ injection Inject 0.3 mLs (0.3 mg total) into the muscle once as needed. Reported on 04/30/2016 (Patient not taking: Reported on 07/24/2018) 1 Device 0  . METRONIDAZOLE, TOPICAL, 0.75 % LOTN  Apply 1 application topically 2 (two) times daily. (Patient not taking: Reported on 07/24/2018) 59 mL 12  . promethazine (PHENERGAN) 25 MG tablet Take 1 tablet (25 mg total) by mouth every 8 (eight) hours as needed for nausea. (Patient not taking: Reported on 07/24/2018) 30 tablet 0   No current facility-administered medications on file prior to visit.    Allergies  Allergen Reactions  . Bee Venom Anaphylaxis  . Shellfish Allergy Anaphylaxis  . Penicillins Hives   ROS: no fever, chills, headaches, other neuro symptoms other than the neuropathic pain as described above.  No shortness of breath, bleeding, bruising (resolved after ankle sprain), rashes   PHYSICAL EXAM:  BP 122/82   Pulse  84   Ht 5\' 3"  (1.6 m)   Wt 267 lb 3.2 oz (121.2 kg)   LMP 10/18/1982   BMI 47.33 kg/m   Wt Readings from Last 3 Encounters:  07/24/18 267 lb 3.2 oz (121.2 kg)  07/13/18 262 lb (118.8 kg)  06/28/18 265 lb (120.2 kg)    LLE: 2+ pulses, normal sensation. nontender at medial and lateral malleoli. Tender at left ankle, posteromedially to the medial malleolus. Tenderness extends down to the medial calcaneous. No brusing, soft tissue swelling or other abnormality noted  No pain at PF insertion on calcaneous or pain with PF stretch Negative Drawer test  ASSESSMENT/PLAN:   Left ankle pain, unspecified chronicity - in[place]on injurly with sprain, resolved. residual neuropathic type pain, intermittent at L medial calcaneous. Trial of NSAID. Xray to check for avulsion fx.  - Plan: meloxicam (MOBIC) 15 MG tablet, DG Ankle Complete Left   Xray left ankle, if not improving with NSAID course--? Avulsion fracture at medial calcaneous)  For now, course of NSAID. Risks/SE reviewed in detail.   Take meloxicam once daily with food. If it bothers your stomach, cut the dose in half. If it still bothers your stomach, take pepcid or prilosec OTC to protect your stomach. Take it regularly for 7-10 days, or until pain is gone. If pain is not improving at all, go to get the x-ray of the ankle/calcaneous.  Continue icing, capsaicin, vs other topical agents

## 2018-07-24 NOTE — Patient Instructions (Signed)
  Take meloxicam once daily with food. If it bothers your stomach, cut the dose in half. If it still bothers your stomach, take pepcid or prilosec OTC to protect your stomach. Take it regularly for 7-10 days, or until pain is gone. If pain is not improving at all, go to get the x-ray of the ankle/calcaneous.  Continue icing, capsaicin, vs other topical agents

## 2018-07-26 ENCOUNTER — Ambulatory Visit (INDEPENDENT_AMBULATORY_CARE_PROVIDER_SITE_OTHER): Payer: No Typology Code available for payment source | Admitting: Bariatrics

## 2018-07-26 ENCOUNTER — Encounter (INDEPENDENT_AMBULATORY_CARE_PROVIDER_SITE_OTHER): Payer: Self-pay | Admitting: Bariatrics

## 2018-07-26 VITALS — BP 116/76 | HR 78 | Temp 97.9°F | Ht 63.0 in | Wt 262.0 lb

## 2018-07-26 DIAGNOSIS — E7849 Other hyperlipidemia: Secondary | ICD-10-CM | POA: Diagnosis not present

## 2018-07-26 DIAGNOSIS — Z6841 Body Mass Index (BMI) 40.0 and over, adult: Secondary | ICD-10-CM | POA: Diagnosis not present

## 2018-07-26 DIAGNOSIS — E119 Type 2 diabetes mellitus without complications: Secondary | ICD-10-CM

## 2018-07-31 ENCOUNTER — Encounter: Payer: Self-pay | Admitting: Family Medicine

## 2018-07-31 NOTE — Progress Notes (Signed)
Office: [phone]  /  Fax: [phone]   HPI:   Chief Complaint: OBESITY Cindy Carey is here to discuss her progress with her obesity treatment plan. She is on the  keep a food journal with 1500 calories and 90 protein  and is following her eating plan approximately 90 % of the time. She states she is exercising 30 minutes 3 times per week. Cindy Carey is not accustomed to that much meat, other wise she was following the plan Her weight is 262 lb (118.8 kg) today and maintained since her last visit. She has lost 0 lbs since starting treatment with [name].  Diabetes II Cindy Carey has a diagnosis of diabetes type II. Cindy Carey states BGs range between 86 and 102 and denies any hypoglycemic episodes. Lat Insulin 59.1. 2 hour PP 96-136. Last A1c was Hemoglobin A1C Latest Ref Rng & Units 07/13/2018 04/13/2018 12/07/2017  HGBA1C 4.8 - 5.6 % 7.0(H) 6.0(A) 6.2  Some recent data might be hidden    She has been working on intensive lifestyle modifications including diet, exercise, and weight loss to help control her blood glucose levels.  Hyperlipidemia Cindy Carey has hyperlipidemia and has been trying to improve her cholesterol levels with intensive lifestyle modification including a low saturated fat diet, exercise and weight loss. She denies any chest pain, claudication or myalgias. She is currently on simvastatin.    ALLERGIES: Allergies  Allergen Reactions  . Bee Venom Anaphylaxis  . Shellfish Allergy Anaphylaxis  . Penicillins Hives    MEDICATIONS: Current Outpatient Medications on File Prior to Visit  Medication Sig Dispense Refill  . ACCU-CHEK FASTCLIX LANCETS MISC USE TO CHECK BLOOD SUGAR TWICE A DAY AS DIRECTED 102 each 3  . ACCU-CHEK GUIDE test strip USE TO CHECK BLOOD SUGAR TWICE A DAY AS DIRECTED 100 each 3  . albuterol (PROVENTIL HFA;VENTOLIN HFA) 108 (90 BASE) MCG/ACT inhaler Inhale 2 puffs into the lungs every 6 (six) hours as needed for wheezing. Reported on 03/31/2016    . aspirin EC 81 MG tablet Take  81 mg by mouth daily.    . Biotin 10000 MCG TABS Take by mouth.    . Calcium Carbonate-Vit D-Min (CALCIUM 600+D PLUS MINERALS) 600-400 MG-UNIT TABS Take 1 tablet by mouth 2 (two) times daily.    . cyclobenzaprine (FLEXERIL) 10 MG tablet Take 1 tablet (10 mg total) by mouth 3 (three) times daily as needed for muscle spasms. 30 tablet 0  . EPINEPHrine 0.3 mg/0.3 mL IJ SOAJ injection Inject 0.3 mLs (0.3 mg total) into the muscle once as needed. Reported on 04/30/2016 1 Device 0  . lisinopril (PRINIVIL,ZESTRIL) 10 MG tablet Take 1 tablet (10 mg total) by mouth daily. 90 tablet 0  . Magnesium 250 MG TABS Take 250 mg by mouth daily.    . meloxicam (MOBIC) 15 MG tablet Take 0.5-1 tablets (7.5-15 mg total) by mouth daily. 15 tablet 0  . metFORMIN (GLUCOPHAGE-XR) 500 MG 24 hr tablet TAKE 2 TABLETS BY MOUTH 2 TIMES DAILY BEFORE A MEAL. 360 tablet 0  . METRONIDAZOLE, TOPICAL, 0.75 % LOTN Apply 1 application topically 2 (two) times daily. 59 mL 12  . Multiple Vitamins-Minerals (MULTIVITAMIN PO) Take 1 tablet by mouth daily. Reported on 03/31/2016    . promethazine (PHENERGAN) 25 MG tablet Take 1 tablet (25 mg total) by mouth every 8 (eight) hours as needed for nausea. 30 tablet 0  . simvastatin (ZOCOR) 40 MG tablet Take 1 tablet (40 mg total) by mouth every evening. 90 tablet 0  .  tamoxifen (NOLVADEX) 20 MG tablet Take 1 tablet (20 mg total) by mouth daily. 90 tablet 3  . TRULICITY 1.5 JY/7.8GN SOPN INJECT 1.5 MG INTO THE SKIN EVERY 7 DAYS. 6 mL 0   No current facility-administered medications on file prior to visit.     PAST MEDICAL HISTORY: Past Medical History:  Diagnosis Date  . Anemia   . Asthma   . Blood transfusion without reported diagnosis 1983  . Diabetes mellitus without complication ([place]) 5621   T2DM  . Dyspareunia   . Exercise-induced asthma    worse in winter  . H/O bone density study 2013  . H/O cold sores   . H/O colonoscopy 2009  . History of endometriosis   . Hyperlipidemia   .  Hypertension   . Injury of tendon of rotator cuff 2015   gym injury (right)  . Knee pain 08/2015   L>R  . Migraine with typical aura   . Mild CAD 04/04/2018   Mild disease on coronary CT-A 01/2018.  . Morbid obesity ([place]) 09/04/12   BMI 49.4 kg/m^2   . Osteoarthritis     PAST SURGICAL HISTORY: Past Surgical History:  Procedure Laterality Date  . BREAST BIOPSY  2010  . BREAST LUMPECTOMY  2010  . BREAST LUMPECTOMY WITH RADIOACTIVE SEED LOCALIZATION Left 07/23/2015   Procedure: LEFT BREAST SEED GUIDED EXCISION;  Surgeon: Rolm Bookbinder, MD;  Location: [place];  Service: General;  Laterality: Left;  . BREATH TEK H PYLORI  08/28/2012   Procedure: BREATH TEK H PYLORI;  Surgeon: Pedro Earls, MD;  Location: [facility];  Service: General;  Laterality: N/A;  . CHOLECYSTECTOMY  2007  . EXPLORATORY LAPAROTOMY  1983   endometriosis  . GANGLION CYST EXCISION Left 1999   Foot  . TOTAL ABDOMINAL HYSTERECTOMY W/ BILATERAL SALPINGOOPHORECTOMY  1984   endometriosis (and appendectomy)    SOCIAL HISTORY: Social History   Tobacco Use  . Smoking status: Never Smoker  . Smokeless tobacco: Never Used  Substance Use Topics  . Alcohol use: No  . Drug use: No    FAMILY HISTORY: Family History  Problem Relation Age of Onset  . Diabetes Father   . Heart attack Father 14  . Heart disease Father   . Hypertension Father   . Peripheral Artery Disease Father        stents at 63  . Diabetes Paternal Grandfather   . Heart disease Paternal Grandfather   . Osteoporosis Maternal Grandmother   . Heart failure Maternal Grandmother   . Thyroid disease Mother        hypothyroidism  . Heart disease Mother 37       CABG at 61  . Hypertension Mother   . Diverticulitis Mother 74       perforation, required surgery  . Pneumonia Sister   . Cancer Maternal Grandfather        lung  . Dementia Paternal Grandmother   . Cancer Paternal Grandmother        melanoma on her foot (not  related to death)  . Breast cancer Paternal Aunt        50's  . Cancer Maternal Aunt        lung  . Cancer Maternal Uncle        lung  . Breast cancer Cousin        mets to lung  . Congestive Heart Failure Maternal Uncle   . Breast cancer Cousin     ROS:  Review of Systems  All other systems reviewed and are negative.   PHYSICAL EXAM: Blood pressure 116/76, pulse 78, temperature 97.9 F (36.6 C), temperature source Oral, height 5\' 3"  (1.6 m), weight 262 lb (118.8 kg), last menstrual period 10/18/1982, SpO2 98 %. Body mass index is 46.41 kg/m. Physical Exam  Constitutional: She is oriented to person, place, and time. She appears well-developed and well-nourished.  HENT:  Head: Normocephalic.  Eyes: EOM are normal.  Neck: Normal range of motion.  Pulmonary/Chest: Effort normal.  Neurological: She is alert and oriented to person, place, and time.  Skin: Skin is warm and dry.  Psychiatric: She has a normal mood and affect. Her behavior is normal.  Vitals reviewed.   RECENT LABS AND TESTS: BMET    Component Value Date/Time   NA 139 07/13/2018 1031   NA 139 11/21/2015 0826   K 4.5 07/13/2018 1031   K 4.7 11/21/2015 0826   CL 102 07/13/2018 1031   CO2 18 (L) 07/13/2018 1031   CO2 24 11/21/2015 0826   GLUCOSE 113 (H) 07/13/2018 1031   GLUCOSE 96 09/30/2016 0811   GLUCOSE 125 11/21/2015 0826   BUN 12 07/13/2018 1031   BUN 12.5 11/21/2015 0826   CREATININE 0.83 07/13/2018 1031   CREATININE 0.93 09/30/2016 0811   CREATININE 1.0 11/21/2015 0826   CALCIUM 9.2 07/13/2018 1031   CALCIUM 9.6 11/21/2015 0826   GFRNONAA 77 07/13/2018 1031   GFRAA 89 07/13/2018 1031   Lab Results  Component Value Date   HGBA1C 7.0 (H) 07/13/2018   HGBA1C 6.0 (A) 04/13/2018   HGBA1C 6.2 12/07/2017   HGBA1C 6.1 01/05/2017   HGBA1C 6.0 09/30/2016   Lab Results  Component Value Date   INSULIN 59.1 (H) 07/13/2018   CBC    Component Value Date/Time   WBC 8.7 12/07/2017 1619   WBC 7.8  09/30/2016 0811   RBC 4.57 12/07/2017 1619   RBC 4.92 09/30/2016 0811   HGB 14.1 12/07/2017 1619   HGB 15.1 11/21/2015 0826   HCT 42.5 12/07/2017 1619   HCT 46.3 11/21/2015 0826   PLT 121 (L) 12/07/2017 1619   MCV 93 12/07/2017 1619   MCV 95.1 11/21/2015 0826   MCH 30.9 12/07/2017 1619   MCH 32.3 09/30/2016 0811   MCHC 33.2 12/07/2017 1619   MCHC 33.1 09/30/2016 0811   RDW 14.3 12/07/2017 1619   RDW 15.1 (H) 11/21/2015 0826   LYMPHSABS 3.5 (H) 12/07/2017 1619   LYMPHSABS 2.6 11/21/2015 0826   MONOABS 546 09/30/2016 0811   MONOABS 0.7 11/21/2015 0826   EOSABS 0.2 12/07/2017 1619   BASOSABS 0.0 12/07/2017 1619   BASOSABS 0.0 11/21/2015 0826   Iron/TIBC/Ferritin/ %Sat    Component Value Date/Time   IRON 84 12/07/2017 1619   Lipid Panel     Component Value Date/Time   CHOL 133 12/07/2017 1619   TRIG 69 12/07/2017 1619   HDL 71 12/07/2017 1619   CHOLHDL 1.9 12/07/2017 1619   CHOLHDL 2.3 09/30/2016 0811   VLDL 26 09/30/2016 0811   LDLCALC 48 12/07/2017 1619   Hepatic Function Panel     Component Value Date/Time   PROT 6.5 07/13/2018 1031   PROT 7.2 11/21/2015 0826   ALBUMIN 4.0 07/13/2018 1031   ALBUMIN 3.8 11/21/2015 0826   AST 51 (H) 07/13/2018 1031   AST 24 11/21/2015 0826   ALT 24 07/13/2018 1031   ALT 24 11/21/2015 0826   ALKPHOS 93 07/13/2018 1031   ALKPHOS 75 11/21/2015 0826  BILITOT 0.4 07/13/2018 1031   BILITOT 0.58 11/21/2015 0826      Component Value Date/Time   TSH 2.200 12/07/2017 1619   TSH 1.79 03/31/2016 0859   TSH 2.711 09/06/2012 0741    ASSESSMENT AND PLAN: Type 2 diabetes mellitus without complication, without long-term current use of insulin (HCC)  Other hyperlipidemia  Class 3 severe obesity with serious comorbidity and body mass index (BMI) of 40.0 to 44.9 in adult, unspecified obesity type ([place])  PLAN:  Diabetes II Cindy Carey has been given extensive diabetes education by myself today including ideal fasting and post-prandial blood  glucose readings, individual ideal HgA1c goals  and hypoglycemia prevention. We discussed the importance of good blood sugar control to decrease the likelihood of diabetic complications such as nephropathy, neuropathy, limb loss, blindness, coronary artery disease, and death. We discussed the importance of intensive lifestyle modification including diet, exercise and weight loss as the first line treatment for diabetes. Cindy Carey agrees to continue her diabetes medications and will follow up at the agreed upon time.Will continue Metformin and Trulicty at this time and decrease carbohydrate intake.   Hyperlipidemia Cindy Carey was informed of the American Heart Association Guidelines emphasizing intensive lifestyle modifications as the first line treatment for hyperlipidemia. We discussed many lifestyle modifications today in depth, and Cindy Carey will continue to work on decreasing saturated fats such as fatty red meat, butter and many fried foods. She will also increase vegetables and lean protein in her diet and continue to work on exercise and weight loss efforts. Continue Simvastatin at this time and decrease her carb intake as well as her saturated fat intake.   Obesity Cindy Carey is currently in the action stage of change. As such, her goal is to continue with weight loss efforts She has agreed to follow our protein rich vegetarian plan Cindy Carey has been instructed to work up to a goal of 150 minutes of combined cardio and strengthening exercise per week for weight loss and overall health benefits. We discussed the following Behavioral Modification Stratagies today: increasing vegetables and work on meal planning and easy cooking plans   Cindy Carey has agreed to follow up with our clinic in 2 weeks. She was informed of the importance of frequent follow up visits to maximize her success with intensive lifestyle modifications for her multiple health conditions.   OBESITY BEHAVIORAL INTERVENTION VISIT  Today's visit was # 2    Starting weight: 262 lbs Starting date: 07/13/18 Today's weight : Weight: 262 lb (118.8 kg)  Today's date: 07/26/18 Total lbs lost to date: 0 At least 15 minutes were spent on discussing the following behavioral intervention visit.   ASK: We discussed the diagnosis of obesity with Cindy Carey today and Cindy Carey agreed to give [name] permission to discuss obesity behavioral modification therapy today.  ASSESS: Cindy Carey has the diagnosis of obesity and her BMI today is @TBMI @ Cindy Carey is in the action stage of change   ADVISE: Cindy Carey was educated on the multiple health risks of obesity as well as the benefit of weight loss to improve her health. She was advised of the need for long term treatment and the importance of lifestyle modifications to improve her current health and to decrease her risk of future health problems.  AGREE: Multiple dietary modification options and treatment options were discussed and  Cindy Carey agreed to follow the recommendations documented in the above note.  ARRANGE: Cindy Carey was educated on the importance of frequent visits to treat obesity as outlined per CMS and USPSTF guidelines and  agreed to schedule her next follow up appointment today.  I, April Moore , am acting as Location manager for [employer] DO.   I have reviewed the above documentation for accuracy and completeness, and I agree with the above. -Jearld Lesch, DO

## 2018-08-08 ENCOUNTER — Ambulatory Visit (INDEPENDENT_AMBULATORY_CARE_PROVIDER_SITE_OTHER): Payer: No Typology Code available for payment source | Admitting: Bariatrics

## 2018-08-08 VITALS — BP 117/74 | HR 78 | Temp 97.6°F | Ht 63.0 in | Wt 259.0 lb

## 2018-08-08 DIAGNOSIS — Z6841 Body Mass Index (BMI) 40.0 and over, adult: Secondary | ICD-10-CM

## 2018-08-08 DIAGNOSIS — E119 Type 2 diabetes mellitus without complications: Secondary | ICD-10-CM | POA: Diagnosis not present

## 2018-08-08 DIAGNOSIS — E7849 Other hyperlipidemia: Secondary | ICD-10-CM

## 2018-08-10 NOTE — Progress Notes (Signed)
Office: [phone]  /  Fax: [phone]   HPI:   Chief Complaint: OBESITY Cindy Carey is here to discuss her progress with her obesity treatment plan. She is on the  follow our protein rich vegetarian plan and is following her eating plan approximately 100 % of the time. She states she is exercising by doing the treadmill and stationary bike for 30-40 minutes 3 times per week. [name] feels this plan is more tolerable. She denies hunger and cravings.  Her weight is 259 lb (117.5 kg) today and has had a weight loss of 3 pounds over a period of 2 weeks since her last visit. She has lost 3 lbs since starting treatment with [name].  Diabetes II [name] has a diagnosis of diabetes type II. [name] denies any hypoglycemic episodes. Last A1c was 7.0 and last Insulin level was 59.1.  She has been working on intensive lifestyle modifications including diet, exercise, and weight loss to help control her blood glucose levels.   ALLERGIES: Allergies  Allergen Reactions  . Bee Venom Anaphylaxis  . Shellfish Allergy Anaphylaxis  . Penicillins Hives    MEDICATIONS: Current Outpatient Medications on File Prior to Visit  Medication Sig Dispense Refill  . ACCU-CHEK FASTCLIX LANCETS MISC USE TO CHECK BLOOD SUGAR TWICE A DAY AS DIRECTED 102 each 3  . ACCU-CHEK GUIDE test strip USE TO CHECK BLOOD SUGAR TWICE A DAY AS DIRECTED 100 each 3  . albuterol (PROVENTIL HFA;VENTOLIN HFA) 108 (90 BASE) MCG/ACT inhaler Inhale 2 puffs into the lungs every 6 (six) hours as needed for wheezing. Reported on 03/31/2016    . aspirin EC 81 MG tablet Take 81 mg by mouth daily.    . Biotin 10000 MCG TABS Take by mouth.    . Calcium Carbonate-Vit D-Min (CALCIUM 600+D PLUS MINERALS) 600-400 MG-UNIT TABS Take 1 tablet by mouth 2 (two) times daily.    . cyclobenzaprine (FLEXERIL) 10 MG tablet Take 1 tablet (10 mg total) by mouth 3 (three) times daily as needed for muscle spasms. 30 tablet 0  . EPINEPHrine 0.3 mg/0.3 mL IJ SOAJ injection Inject  0.3 mLs (0.3 mg total) into the muscle once as needed. Reported on 04/30/2016 1 Device 0  . lisinopril (PRINIVIL,ZESTRIL) 10 MG tablet Take 1 tablet (10 mg total) by mouth daily. 90 tablet 0  . Magnesium 250 MG TABS Take 250 mg by mouth daily.    . meloxicam (MOBIC) 15 MG tablet Take 0.5-1 tablets (7.5-15 mg total) by mouth daily. 15 tablet 0  . metFORMIN (GLUCOPHAGE-XR) 500 MG 24 hr tablet TAKE 2 TABLETS BY MOUTH 2 TIMES DAILY BEFORE A MEAL. 360 tablet 0  . METRONIDAZOLE, TOPICAL, 0.75 % LOTN Apply 1 application topically 2 (two) times daily. 59 mL 12  . Multiple Vitamins-Minerals (MULTIVITAMIN PO) Take 1 tablet by mouth daily. Reported on 03/31/2016    . promethazine (PHENERGAN) 25 MG tablet Take 1 tablet (25 mg total) by mouth every 8 (eight) hours as needed for nausea. 30 tablet 0  . simvastatin (ZOCOR) 40 MG tablet Take 1 tablet (40 mg total) by mouth every evening. 90 tablet 0  . tamoxifen (NOLVADEX) 20 MG tablet Take 1 tablet (20 mg total) by mouth daily. 90 tablet 3  . TRULICITY 1.5 GU/4.4IH SOPN INJECT 1.5 MG INTO THE SKIN EVERY 7 DAYS. 6 mL 0   No current facility-administered medications on file prior to visit.     PAST MEDICAL HISTORY: Past Medical History:  Diagnosis Date  . Anemia   .  Asthma   . Blood transfusion without reported diagnosis 1983  . Diabetes mellitus without complication ([place]) 6962   T2DM  . Dyspareunia   . Exercise-induced asthma    worse in winter  . H/O bone density study 2013  . H/O cold sores   . H/O colonoscopy 2009  . History of endometriosis   . Hyperlipidemia   . Hypertension   . Injury of tendon of rotator cuff 2015   gym injury (right)  . Knee pain 08/2015   L>R  . Migraine with typical aura   . Mild CAD 04/04/2018   Mild disease on coronary CT-A 01/2018.  . Morbid obesity ([place]) 09/04/12   BMI 49.4 kg/m^2   . Osteoarthritis     PAST SURGICAL HISTORY: Past Surgical History:  Procedure Laterality Date  . BREAST BIOPSY  2010  . BREAST  LUMPECTOMY  2010  . BREAST LUMPECTOMY WITH RADIOACTIVE SEED LOCALIZATION Left 07/23/2015   Procedure: LEFT BREAST SEED GUIDED EXCISION;  Surgeon: Rolm Bookbinder, MD;  Location: [place];  Service: General;  Laterality: Left;  . BREATH TEK H PYLORI  08/28/2012   Procedure: BREATH TEK H PYLORI;  Surgeon: Pedro Earls, MD;  Location: [facility];  Service: General;  Laterality: N/A;  . CHOLECYSTECTOMY  2007  . EXPLORATORY LAPAROTOMY  1983   endometriosis  . GANGLION CYST EXCISION Left 1999   Foot  . TOTAL ABDOMINAL HYSTERECTOMY W/ BILATERAL SALPINGOOPHORECTOMY  1984   endometriosis (and appendectomy)    SOCIAL HISTORY: Social History   Tobacco Use  . Smoking status: Never Smoker  . Smokeless tobacco: Never Used  Substance Use Topics  . Alcohol use: No  . Drug use: No    FAMILY HISTORY: Family History  Problem Relation Age of Onset  . Diabetes Father   . Heart attack Father 73  . Heart disease Father   . Hypertension Father   . Peripheral Artery Disease Father        stents at 58  . Diabetes Paternal Grandfather   . Heart disease Paternal Grandfather   . Osteoporosis Maternal Grandmother   . Heart failure Maternal Grandmother   . Thyroid disease Mother        hypothyroidism  . Heart disease Mother 26       CABG at 73  . Hypertension Mother   . Diverticulitis Mother 75       perforation, required surgery  . Pneumonia Sister   . Cancer Maternal Grandfather        lung  . Dementia Paternal Grandmother   . Cancer Paternal Grandmother        melanoma on her foot (not related to death)  . Breast cancer Paternal Aunt        43's  . Cancer Maternal Aunt        lung  . Cancer Maternal Uncle        lung  . Breast cancer Cousin        mets to lung  . Congestive Heart Failure Maternal Uncle   . Breast cancer Cousin     ROS: Review of Systems  Constitutional: Positive for weight loss.  Cardiovascular: Negative for chest pain and claudication.    Gastrointestinal: Negative for nausea and vomiting.  Musculoskeletal: Negative for myalgias.       Negative for muscle weakness  Endo/Heme/Allergies:       Negative for hypoglycemia    PHYSICAL EXAM: Blood pressure 117/74, pulse 78, temperature 97.6 F (36.4  C), temperature source Oral, height 5\' 3"  (1.6 m), weight 259 lb (117.5 kg), last menstrual period 10/18/1982, SpO2 92 %. Body mass index is 45.88 kg/m. Physical Exam  RECENT LABS AND TESTS: BMET    Component Value Date/Time   NA 139 07/13/2018 1031   NA 139 11/21/2015 0826   K 4.5 07/13/2018 1031   K 4.7 11/21/2015 0826   CL 102 07/13/2018 1031   CO2 18 (L) 07/13/2018 1031   CO2 24 11/21/2015 0826   GLUCOSE 113 (H) 07/13/2018 1031   GLUCOSE 96 09/30/2016 0811   GLUCOSE 125 11/21/2015 0826   BUN 12 07/13/2018 1031   BUN 12.5 11/21/2015 0826   CREATININE 0.83 07/13/2018 1031   CREATININE 0.93 09/30/2016 0811   CREATININE 1.0 11/21/2015 0826   CALCIUM 9.2 07/13/2018 1031   CALCIUM 9.6 11/21/2015 0826   GFRNONAA 77 07/13/2018 1031   GFRAA 89 07/13/2018 1031   Lab Results  Component Value Date   HGBA1C 7.0 (H) 07/13/2018   HGBA1C 6.0 (A) 04/13/2018   HGBA1C 6.2 12/07/2017   HGBA1C 6.1 01/05/2017   HGBA1C 6.0 09/30/2016   Lab Results  Component Value Date   INSULIN 59.1 (H) 07/13/2018   CBC    Component Value Date/Time   WBC 8.7 12/07/2017 1619   WBC 7.8 09/30/2016 0811   RBC 4.57 12/07/2017 1619   RBC 4.92 09/30/2016 0811   HGB 14.1 12/07/2017 1619   HGB 15.1 11/21/2015 0826   HCT 42.5 12/07/2017 1619   HCT 46.3 11/21/2015 0826   PLT 121 (L) 12/07/2017 1619   MCV 93 12/07/2017 1619   MCV 95.1 11/21/2015 0826   MCH 30.9 12/07/2017 1619   MCH 32.3 09/30/2016 0811   MCHC 33.2 12/07/2017 1619   MCHC 33.1 09/30/2016 0811   RDW 14.3 12/07/2017 1619   RDW 15.1 (H) 11/21/2015 0826   LYMPHSABS 3.5 (H) 12/07/2017 1619   LYMPHSABS 2.6 11/21/2015 0826   MONOABS 546 09/30/2016 0811   MONOABS 0.7  11/21/2015 0826   EOSABS 0.2 12/07/2017 1619   BASOSABS 0.0 12/07/2017 1619   BASOSABS 0.0 11/21/2015 0826   Iron/TIBC/Ferritin/ %Sat    Component Value Date/Time   IRON 84 12/07/2017 1619   Lipid Panel     Component Value Date/Time   CHOL 133 12/07/2017 1619   TRIG 69 12/07/2017 1619   HDL 71 12/07/2017 1619   CHOLHDL 1.9 12/07/2017 1619   CHOLHDL 2.3 09/30/2016 0811   VLDL 26 09/30/2016 0811   LDLCALC 48 12/07/2017 1619   Hepatic Function Panel     Component Value Date/Time   PROT 6.5 07/13/2018 1031   PROT 7.2 11/21/2015 0826   ALBUMIN 4.0 07/13/2018 1031   ALBUMIN 3.8 11/21/2015 0826   AST 51 (H) 07/13/2018 1031   AST 24 11/21/2015 0826   ALT 24 07/13/2018 1031   ALT 24 11/21/2015 0826   ALKPHOS 93 07/13/2018 1031   ALKPHOS 75 11/21/2015 0826   BILITOT 0.4 07/13/2018 1031   BILITOT 0.58 11/21/2015 0826      Component Value Date/Time   TSH 2.200 12/07/2017 1619   TSH 1.79 03/31/2016 0859   TSH 2.711 09/06/2012 0741    ASSESSMENT AND PLAN: Type 2 diabetes mellitus without complication, without long-term current use of insulin ([place])  Other hyperlipidemia  Class 3 severe obesity with serious comorbidity and body mass index (BMI) of 45.0 to 49.9 in adult, unspecified obesity type ([place])  PLAN: Diabetes II Alexa has been given extensive diabetes education by  myself today including ideal fasting and post-prandial blood glucose readings, individual ideal HgA1c goals  and hypoglycemia prevention. We discussed the importance of good blood sugar control to decrease the likelihood of diabetic complications such as nephropathy, neuropathy, limb loss, blindness, coronary artery disease, and death. We discussed the importance of intensive lifestyle modification including diet, exercise and weight loss as the first line treatment for diabetes. Victoriah agrees to continue her diabetes medications and will follow up at the agreed upon time.  Hyperlipidemia Tangi was informed of the  American Heart Association Guidelines emphasizing intensive lifestyle modifications as the first line treatment for hyperlipidemia. We discussed many lifestyle modifications today in depth, and Chai will continue to work on decreasing saturated fats such as fatty red meat, butter and many fried foods. She will also increase vegetables and lean protein in her diet and continue to work on exercise and weight loss efforts. She will continue simvastatin as prescribed. Agrees to follow up with our clinic as directed.   I spent > than 50% of the 15 minute visit on counseling as documented in the note.  Obesity Celes is currently in the action stage of change. As such, her goal is to continue with weight loss efforts She has agreed to [employer].  [name] has been instructed to work up to a goal of 150 minutes of combined cardio and strengthening exercise per week for weight loss and overall health benefits. We discussed the following Behavioral Modification Strategies today: increasing lean protein intake, increasing water intake, no skipping meals, better snacking choices.    Kensly has agreed to follow up with our clinic in 2 weeks. She was informed of the importance of frequent follow up visits to maximize her success with intensive lifestyle modifications for her multiple health conditions.   OBESITY BEHAVIORAL INTERVENTION VISIT  Today's visit was # 3   Starting weight: 262 lb Starting date: 07/13/18 Today's weight : 259 lb Today's date: 08/08/18 Total lbs lost to date: 3 lb    ASK: We discussed the diagnosis of obesity with Joelyn Oms today and Chayah agreed to give [name] permission to discuss obesity behavioral modification therapy today.  ASSESS: Helina has the diagnosis of obesity and her BMI today is 45.89. Marquisha is in the action stage of change   ADVISE: Laquisha was educated on the multiple health risks of obesity as well as the benefit of weight loss to improve her health. She  was advised of the need for long term treatment and the importance of lifestyle modifications to improve her current health and to decrease her risk of future health problems.  AGREE: Multiple dietary modification options and treatment options were discussed and  Mercedes agreed to follow the recommendations documented in the above note.  ARRANGE: Rhenda was educated on the importance of frequent visits to treat obesity as outlined per CMS and USPSTF guidelines and agreed to schedule her next follow up appointment today.  Leary Roca, am acting as transcriptionist for [employer], DO   I have reviewed the above documentation for accuracy and completeness, and I agree with the above. -Jearld Lesch, DO

## 2018-08-18 DIAGNOSIS — M65312 Trigger thumb, left thumb: Secondary | ICD-10-CM

## 2018-08-18 HISTORY — DX: Trigger thumb, left thumb: M65.312

## 2018-08-22 ENCOUNTER — Ambulatory Visit (INDEPENDENT_AMBULATORY_CARE_PROVIDER_SITE_OTHER): Payer: No Typology Code available for payment source | Admitting: Bariatrics

## 2018-08-22 ENCOUNTER — Encounter (INDEPENDENT_AMBULATORY_CARE_PROVIDER_SITE_OTHER): Payer: Self-pay | Admitting: Bariatrics

## 2018-08-22 VITALS — BP 126/82 | HR 83 | Temp 97.9°F | Ht 63.0 in | Wt 260.0 lb

## 2018-08-22 DIAGNOSIS — Z6841 Body Mass Index (BMI) 40.0 and over, adult: Secondary | ICD-10-CM

## 2018-08-22 DIAGNOSIS — I10 Essential (primary) hypertension: Secondary | ICD-10-CM

## 2018-08-22 DIAGNOSIS — E119 Type 2 diabetes mellitus without complications: Secondary | ICD-10-CM

## 2018-08-22 NOTE — Progress Notes (Signed)
Office: [phone]  /  Fax: [phone]   HPI:   Chief Complaint: OBESITY Cindy Carey is here to discuss her progress with her obesity treatment plan. She is on the Pescatarian eating plan and is following her eating plan approximately 50 % of the time. She states she is exercising 0 minutes 0 times per week. Cindy Carey is having more stressors. She has not been doing any meal planning. Her weight is 260 lb (117.9 kg) today and has had a weight gain of 1 pound over a period of 2 weeks since her last visit. She has lost 2 lbs since starting treatment with [name].  Diabetes II Cindy Carey has a diagnosis of diabetes type II. She is currently taking Metformin and Trulicity 1.5 mg. Cindy Carey states fasting BGs range in the 90's and she denies any lows. Last A1c was at 7.0 and her last insulin level was at 59.1 She has been working on intensive lifestyle modifications including diet, exercise, and weight loss to help control her blood glucose levels.  Hypertension Cindy Carey is a 62 y.o. female with hypertension. She is currently taking Lisinopril. Cindy Carey denies hypotension or lightheadedness. She is working weight loss to help control her blood pressure with the goal of decreasing her risk of heart attack and stroke. Cindy Carey blood pressure is well controlled.  ALLERGIES: Allergies  Allergen Reactions  . Bee Venom Anaphylaxis  . Shellfish Allergy Anaphylaxis  . Penicillins Hives    MEDICATIONS: Current Outpatient Medications on File Prior to Visit  Medication Sig Dispense Refill  . ACCU-CHEK FASTCLIX LANCETS MISC USE TO CHECK BLOOD SUGAR TWICE A DAY AS DIRECTED 102 each 3  . ACCU-CHEK GUIDE test strip USE TO CHECK BLOOD SUGAR TWICE A DAY AS DIRECTED 100 each 3  . albuterol (PROVENTIL HFA;VENTOLIN HFA) 108 (90 BASE) MCG/ACT inhaler Inhale 2 puffs into the lungs every 6 (six) hours as needed for wheezing. Reported on 03/31/2016    . aspirin EC 81 MG tablet Take 81 mg by mouth daily.    . Biotin  10000 MCG TABS Take by mouth.    . Calcium Carbonate-Vit D-Min (CALCIUM 600+D PLUS MINERALS) 600-400 MG-UNIT TABS Take 1 tablet by mouth 2 (two) times daily.    . cyclobenzaprine (FLEXERIL) 10 MG tablet Take 1 tablet (10 mg total) by mouth 3 (three) times daily as needed for muscle spasms. 30 tablet 0  . EPINEPHrine 0.3 mg/0.3 mL IJ SOAJ injection Inject 0.3 mLs (0.3 mg total) into the muscle once as needed. Reported on 04/30/2016 1 Device 0  . lisinopril (PRINIVIL,ZESTRIL) 10 MG tablet Take 1 tablet (10 mg total) by mouth daily. 90 tablet 0  . Magnesium 250 MG TABS Take 250 mg by mouth daily.    . meloxicam (MOBIC) 15 MG tablet Take 0.5-1 tablets (7.5-15 mg total) by mouth daily. 15 tablet 0  . metFORMIN (GLUCOPHAGE-XR) 500 MG 24 hr tablet TAKE 2 TABLETS BY MOUTH 2 TIMES DAILY BEFORE A MEAL. 360 tablet 0  . METRONIDAZOLE, TOPICAL, 0.75 % LOTN Apply 1 application topically 2 (two) times daily. 59 mL 12  . Multiple Vitamins-Minerals (MULTIVITAMIN PO) Take 1 tablet by mouth daily. Reported on 03/31/2016    . promethazine (PHENERGAN) 25 MG tablet Take 1 tablet (25 mg total) by mouth every 8 (eight) hours as needed for nausea. 30 tablet 0  . simvastatin (ZOCOR) 40 MG tablet Take 1 tablet (40 mg total) by mouth every evening. 90 tablet 0  . tamoxifen (NOLVADEX) 20 MG tablet  Take 1 tablet (20 mg total) by mouth daily. 90 tablet 3  . TRULICITY 1.5 PY/0.9XI SOPN INJECT 1.5 MG INTO THE SKIN EVERY 7 DAYS. 6 mL 0   No current facility-administered medications on file prior to visit.     PAST MEDICAL HISTORY: Past Medical History:  Diagnosis Date  . Anemia   . Asthma   . Blood transfusion without reported diagnosis 1983  . Diabetes mellitus without complication ([place]) 3382   T2DM  . Dyspareunia   . Exercise-induced asthma    worse in winter  . H/O bone density study 2013  . H/O cold sores   . H/O colonoscopy 2009  . History of endometriosis   . Hyperlipidemia   . Hypertension   . Injury of tendon  of rotator cuff 2015   gym injury (right)  . Knee pain 08/2015   L>R  . Migraine with typical aura   . Mild CAD 04/04/2018   Mild disease on coronary CT-A 01/2018.  . Morbid obesity ([place]) 09/04/12   BMI 49.4 kg/m^2   . Osteoarthritis     PAST SURGICAL HISTORY: Past Surgical History:  Procedure Laterality Date  . BREAST BIOPSY  2010  . BREAST LUMPECTOMY  2010  . BREAST LUMPECTOMY WITH RADIOACTIVE SEED LOCALIZATION Left 07/23/2015   Procedure: LEFT BREAST SEED GUIDED EXCISION;  Surgeon: Rolm Bookbinder, MD;  Location: [facility];  Service: General;  Laterality: Left;  . BREATH TEK H PYLORI  08/28/2012   Procedure: BREATH TEK H PYLORI;  Surgeon: Pedro Earls, MD;  Location: [facility];  Service: General;  Laterality: N/A;  . CHOLECYSTECTOMY  2007  . EXPLORATORY LAPAROTOMY  1983   endometriosis  . GANGLION CYST EXCISION Left 1999   Foot  . TOTAL ABDOMINAL HYSTERECTOMY W/ BILATERAL SALPINGOOPHORECTOMY  1984   endometriosis (and appendectomy)    SOCIAL HISTORY: Social History   Tobacco Use  . Smoking status: Never Smoker  . Smokeless tobacco: Never Used  Substance Use Topics  . Alcohol use: No  . Drug use: No    FAMILY HISTORY: Family History  Problem Relation Age of Onset  . Diabetes Father   . Heart attack Father 80  . Heart disease Father   . Hypertension Father   . Peripheral Artery Disease Father        stents at 8  . Diabetes Paternal Grandfather   . Heart disease Paternal Grandfather   . Osteoporosis Maternal Grandmother   . Heart failure Maternal Grandmother   . Thyroid disease Mother        hypothyroidism  . Heart disease Mother 55       CABG at 56  . Hypertension Mother   . Diverticulitis Mother 67       perforation, required surgery  . Pneumonia Sister   . Cancer Maternal Grandfather        lung  . Dementia Paternal Grandmother   . Cancer Paternal Grandmother        melanoma on her foot (not related to death)  . Breast  cancer Paternal Aunt        92's  . Cancer Maternal Aunt        lung  . Cancer Maternal Uncle        lung  . Breast cancer Cousin        mets to lung  . Congestive Heart Failure Maternal Uncle   . Breast cancer Cousin     ROS: Review of Systems  Constitutional:  Negative for malaise/fatigue.  Cardiovascular:       Negative for hypotension  Neurological:       Negative for lightheadedness  Endo/Heme/Allergies:       Negative for hypoglycemia    PHYSICAL EXAM: Blood pressure 126/82, pulse 83, temperature 97.9 F (36.6 C), temperature source Oral, height 5\' 3"  (1.6 m), weight 260 lb (117.9 kg), last menstrual period 10/18/1982, SpO2 91 %. Body mass index is 46.06 kg/m. Physical Exam  Constitutional: She is oriented to person, place, and time. She appears well-developed and well-nourished.  Cardiovascular: Normal rate.  Pulmonary/Chest: Effort normal.  Musculoskeletal: Normal range of motion.  Neurological: She is oriented to person, place, and time.  Skin: Skin is warm and dry.  Psychiatric: She has a normal mood and affect. Her behavior is normal.  Vitals reviewed.   RECENT LABS AND TESTS: BMET    Component Value Date/Time   NA 139 07/13/2018 1031   NA 139 11/21/2015 0826   K 4.5 07/13/2018 1031   K 4.7 11/21/2015 0826   CL 102 07/13/2018 1031   CO2 18 (L) 07/13/2018 1031   CO2 24 11/21/2015 0826   GLUCOSE 113 (H) 07/13/2018 1031   GLUCOSE 96 09/30/2016 0811   GLUCOSE 125 11/21/2015 0826   BUN 12 07/13/2018 1031   BUN 12.5 11/21/2015 0826   CREATININE 0.83 07/13/2018 1031   CREATININE 0.93 09/30/2016 0811   CREATININE 1.0 11/21/2015 0826   CALCIUM 9.2 07/13/2018 1031   CALCIUM 9.6 11/21/2015 0826   GFRNONAA 77 07/13/2018 1031   GFRAA 89 07/13/2018 1031   Lab Results  Component Value Date   HGBA1C 7.0 (H) 07/13/2018   HGBA1C 6.0 (A) 04/13/2018   HGBA1C 6.2 12/07/2017   HGBA1C 6.1 01/05/2017   HGBA1C 6.0 09/30/2016   Lab Results  Component Value Date    INSULIN 59.1 (H) 07/13/2018   CBC    Component Value Date/Time   WBC 8.7 12/07/2017 1619   WBC 7.8 09/30/2016 0811   RBC 4.57 12/07/2017 1619   RBC 4.92 09/30/2016 0811   HGB 14.1 12/07/2017 1619   HGB 15.1 11/21/2015 0826   HCT 42.5 12/07/2017 1619   HCT 46.3 11/21/2015 0826   PLT 121 (L) 12/07/2017 1619   MCV 93 12/07/2017 1619   MCV 95.1 11/21/2015 0826   MCH 30.9 12/07/2017 1619   MCH 32.3 09/30/2016 0811   MCHC 33.2 12/07/2017 1619   MCHC 33.1 09/30/2016 0811   RDW 14.3 12/07/2017 1619   RDW 15.1 (H) 11/21/2015 0826   LYMPHSABS 3.5 (H) 12/07/2017 1619   LYMPHSABS 2.6 11/21/2015 0826   MONOABS 546 09/30/2016 0811   MONOABS 0.7 11/21/2015 0826   EOSABS 0.2 12/07/2017 1619   BASOSABS 0.0 12/07/2017 1619   BASOSABS 0.0 11/21/2015 0826   Iron/TIBC/Ferritin/ %Sat    Component Value Date/Time   IRON 84 12/07/2017 1619   Lipid Panel     Component Value Date/Time   CHOL 133 12/07/2017 1619   TRIG 69 12/07/2017 1619   HDL 71 12/07/2017 1619   CHOLHDL 1.9 12/07/2017 1619   CHOLHDL 2.3 09/30/2016 0811   VLDL 26 09/30/2016 0811   LDLCALC 48 12/07/2017 1619   Hepatic Function Panel     Component Value Date/Time   PROT 6.5 07/13/2018 1031   PROT 7.2 11/21/2015 0826   ALBUMIN 4.0 07/13/2018 1031   ALBUMIN 3.8 11/21/2015 0826   AST 51 (H) 07/13/2018 1031   AST 24 11/21/2015 0826   ALT 24 07/13/2018 1031  ALT 24 11/21/2015 0826   ALKPHOS 93 07/13/2018 1031   ALKPHOS 75 11/21/2015 0826   BILITOT 0.4 07/13/2018 1031   BILITOT 0.58 11/21/2015 0826      Component Value Date/Time   TSH 2.200 12/07/2017 1619   TSH 1.79 03/31/2016 0859   TSH 2.711 09/06/2012 0741   Results for Cindy Carey, Cindy Carey (MRN [id number]) as of 08/22/2018 13:37  Ref. Range 07/13/2018 10:31  Vitamin D, 25-Hydroxy Latest Ref Range: 30.0 - 100.0 ng/mL 50.0   ASSESSMENT AND PLAN: Type 2 diabetes mellitus without complication, without long-term current use of insulin (HCC)  Essential  hypertension  Class 3 severe obesity with serious comorbidity and body mass index (BMI) of 45.0 to 49.9 in adult, unspecified obesity type ([place])  PLAN:  Diabetes II Cindy Carey has been given extensive diabetes education by myself today including ideal fasting and post-prandial blood glucose readings, individual ideal Hgb A1c goals and hypoglycemia prevention. We discussed the importance of good blood sugar control to decrease the likelihood of diabetic complications such as nephropathy, neuropathy, limb loss, blindness, coronary artery disease, and death. We discussed the importance of intensive lifestyle modification including diet, exercise and weight loss as the first line treatment for diabetes. Orla will monitor her fasting blood sugar and 2 hour post prandial blood sugar. Cindy Carey agrees to continue Metformin and Trulicity and follow up at the agreed upon time.  Hypertension We discussed sodium restriction, working on healthy weight loss, and a regular exercise program as the means to achieve improved blood pressure control. Cindy Carey agreed with this plan and agreed to follow up as directed. We will continue to monitor her blood pressure as well as her progress with the above lifestyle modifications. She will continue her medications as prescribed and will watch for signs of hypotension as she continues her lifestyle modifications.  I spent > than 50% of the 15 minute visit on counseling as documented in the note.  Obesity Cindy Carey is currently in the action stage of change. As such, her goal is to continue with weight loss efforts She has agreed to follow the [place] eating plan Cindy Carey has been instructed to work up to a goal of 150 minutes of combined cardio and strengthening exercise per week for weight loss and overall health benefits. We discussed the following Behavioral Modification Strategies today: increase H2O intake, keeping healthy foods In the home,  increasing lean protein intake, decreasing  simple carbohydrates , increasing vegetables and do more meal planning  Cindy Carey has agreed to follow up with our clinic in 2 weeks. She was informed of the importance of frequent follow up visits to maximize her success with intensive lifestyle modifications for her multiple health conditions.   OBESITY BEHAVIORAL INTERVENTION VISIT  Today's visit was # 4   Starting weight: 262 lbs Starting date: 07/13/18 Today's weight : 260 lbs  Today's date: 08/22/2018 Total lbs lost to date: 2   ASK: We discussed the diagnosis of obesity with Cindy Carey today and Cindy Carey agreed to give [name] permission to discuss obesity behavioral modification therapy today.  ASSESS: Cindy Carey has the diagnosis of obesity and her BMI today is 46.07 Cindy Carey is in the action stage of change   ADVISE: Nanetta was educated on the multiple health risks of obesity as well as the benefit of weight loss to improve her health. She was advised of the need for long term treatment and the importance of lifestyle modifications to improve her current health and to decrease her risk of future  health problems.  AGREE: Multiple dietary modification options and treatment options were discussed and  Cindy Carey agreed to follow the recommendations documented in the above note.  ARRANGE: Cindy Carey was educated on the importance of frequent visits to treat obesity as outlined per CMS and USPSTF guidelines and agreed to schedule her next follow up appointment today.  Corey Skains, am acting as Location manager for [employer]. Owens Shark, DO  I have reviewed the above documentation for accuracy and completeness, and I agree with the above. -Jearld Lesch, DO

## 2018-09-06 ENCOUNTER — Encounter: Payer: Self-pay | Admitting: Family Medicine

## 2018-09-06 ENCOUNTER — Ambulatory Visit (INDEPENDENT_AMBULATORY_CARE_PROVIDER_SITE_OTHER): Payer: No Typology Code available for payment source | Admitting: Family Medicine

## 2018-09-06 VITALS — BP 140/88 | HR 72 | Ht 63.0 in | Wt 266.0 lb

## 2018-09-06 DIAGNOSIS — E119 Type 2 diabetes mellitus without complications: Secondary | ICD-10-CM

## 2018-09-06 DIAGNOSIS — S6992XA Unspecified injury of left wrist, hand and finger(s), initial encounter: Secondary | ICD-10-CM

## 2018-09-06 MED ORDER — DULAGLUTIDE 1.5 MG/0.5ML ~~LOC~~ SOAJ: SUBCUTANEOUS | 2 refills | Status: DC

## 2018-09-06 MED FILL — TRULICITY 1.5 MG/0.5 ML PEN: 1.5 | 84 days supply | Qty: 6 | Fill #0

## 2018-09-06 NOTE — Progress Notes (Signed)
Chief Complaint  Patient presents with  . Hand Injury    left thumb-hyperextended first Saturday in Nov 11/2 moving some furniture, then did it again the next weekend. Still not able to bend it and causing her pain.   . Medication Refill    needs refill on trulicity to [facility].    11/2 while moving furniture she hyperextended the left thumb. Iced it and used ACE wrap.  Swelling improved. Pain comes and goes, related to position/movement. 11/15 she picked up a heavy dish from dishwasher--was too heavy and re-injured the left thumb (hyperextended again).  Wears thumb spica splint at night (otherwise wakes up in pain from position)--finds herself taking it off sometimes during the night.  She has persistent pain, and finds that she cannot bend the IP joint of the left thumb at all.  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 No pain with typing.  PMH, PSH, SH reviewed. SH---her mother-in-law recently moved in with her (without telling them, just showed up).  She will be moving to assisted living soon.  Outpatient Encounter Medications as of 09/06/2018  Medication Sig Note  . ACCU-CHEK FASTCLIX LANCETS MISC USE TO CHECK BLOOD SUGAR TWICE A DAY AS DIRECTED   . ACCU-CHEK GUIDE test strip USE TO CHECK BLOOD SUGAR TWICE A DAY AS DIRECTED   . aspirin EC 81 MG tablet Take 81 mg by mouth daily.   . Biotin 10000 MCG TABS Take by mouth.   . Calcium Carbonate-Vit D-Min (CALCIUM 600+D PLUS MINERALS) 600-400 MG-UNIT TABS Take 1 tablet by mouth 2 (two) times daily.   . cyclobenzaprine (FLEXERIL) 10 MG tablet Take 1 tablet (10 mg total) by mouth 3 (three) times daily as needed for muscle spasms.   . Dulaglutide (TRULICITY) 1.5 XV/4.0GQ SOPN INJECT 1.5 MG INTO THE SKIN EVERY 7 DAYS.   [name] lisinopril (PRINIVIL,ZESTRIL) 10 MG tablet Take 1 tablet (10 mg total) by mouth daily.   . Magnesium 250 MG TABS Take 250 mg by mouth daily. 03/31/2016: Takes for migraine prevention  . metFORMIN  (GLUCOPHAGE-XR) 500 MG 24 hr tablet TAKE 2 TABLETS BY MOUTH 2 TIMES DAILY BEFORE A MEAL.   [name] METRONIDAZOLE, TOPICAL, 0.75 % LOTN Apply 1 application topically 2 (two) times daily.   . Multiple Vitamins-Minerals (MULTIVITAMIN PO) Take 1 tablet by mouth daily. Reported on 03/31/2016   . simvastatin (ZOCOR) 40 MG tablet Take 1 tablet (40 mg total) by mouth every evening.   . tamoxifen (NOLVADEX) 20 MG tablet Take 1 tablet (20 mg total) by mouth daily.   . [DISCONTINUED] TRULICITY 1.5 QP/6.1PJ SOPN INJECT 1.5 MG INTO THE SKIN EVERY 7 DAYS.   [name] albuterol (PROVENTIL HFA;VENTOLIN HFA) 108 (90 BASE) MCG/ACT inhaler Inhale 2 puffs into the lungs every 6 (six) hours as needed for wheezing. Reported on 03/31/2016   . EPINEPHrine 0.3 mg/0.3 mL IJ SOAJ injection Inject 0.3 mLs (0.3 mg total) into the muscle once as needed. Reported on 04/30/2016 (Patient not taking: Reported on 09/06/2018)   . promethazine (PHENERGAN) 25 MG tablet Take 1 tablet (25 mg total) by mouth every 8 (eight) hours as needed for nausea. (Patient not taking: Reported on 09/06/2018)   . [DISCONTINUED] meloxicam (MOBIC) 15 MG tablet Take 0.5-1 tablets (7.5-15 mg total) by mouth daily.    No facility-administered encounter medications on file as of 09/06/2018.    Allergies  Allergen Reactions  . Bee Venom Anaphylaxis  . Shellfish Allergy Anaphylaxis  .  Penicillins Hives    ROS: no numbness, tingling, or other complaints, except as related to the finger.  Some weight gain noted, related to increased stress (from mother-in-law).  PHYSICAL EXAM:  BP (!) 150/90   Pulse 72   Ht 5\' 3"  (1.6 m)   Wt 266 lb (120.7 kg)   LMP 10/18/1982   BMI 47.12 kg/m   Wt Readings from Last 3 Encounters:  09/06/18 266 lb (120.7 kg)  08/22/18 260 lb (117.9 kg)  08/08/18 259 lb (117.5 kg)   140/88 on repeat by MD  Well appearing, pleasant female in no distress. Exam of hands: No swelling, warmth. Very tender at the palmar aspect of the base of the  thumb (1st MCP). Also tender along the palmar aspect of the PIP, and along the proximal phalanx Small area of erythema from irritation from base at web space  Pain with passive flexion of IP joint. Unable to move actively. Has some discomfort with opposition.  Normal pulses, brisk cap refill. Normal sensation   ASSESSMENT/PLAN:  Injury of left thumb, initial encounter - suspect ligament/tendon injury. Loss of flexion at IP joint and significant persistent pain. Refer to hand surgeon for eval (declines x-ray today, do there) - Plan: Ambulatory referral to Hand Surgery  Type 2 diabetes mellitus without complication, without long-term current use of insulin (HCC) - Plan: Dulaglutide (TRULICITY) 1.5 WC/5.8NI SOPN  BP elevated today. To continue to monitor, work on relaxation/stress reduction. Should be short-lived, mother-in-law will be moving to [place].

## 2018-09-07 ENCOUNTER — Ambulatory Visit (INDEPENDENT_AMBULATORY_CARE_PROVIDER_SITE_OTHER): Payer: No Typology Code available for payment source | Admitting: Bariatrics

## 2018-09-07 VITALS — BP 100/63 | HR 70 | Temp 97.6°F | Ht 63.0 in | Wt 261.0 lb

## 2018-09-07 DIAGNOSIS — Z6841 Body Mass Index (BMI) 40.0 and over, adult: Secondary | ICD-10-CM

## 2018-09-07 DIAGNOSIS — E119 Type 2 diabetes mellitus without complications: Secondary | ICD-10-CM | POA: Diagnosis not present

## 2018-09-07 DIAGNOSIS — I10 Essential (primary) hypertension: Secondary | ICD-10-CM

## 2018-09-12 ENCOUNTER — Encounter (INDEPENDENT_AMBULATORY_CARE_PROVIDER_SITE_OTHER): Payer: Self-pay | Admitting: Bariatrics

## 2018-09-12 NOTE — Progress Notes (Signed)
Office: [phone]  /  Fax: [phone]   HPI:   Chief Complaint: OBESITY Cindy Carey is here to discuss her progress with her obesity treatment plan. She is on the Pescatarian plan and is following her eating plan approximately 50 % of the time. She states she is exercising 0 minutes 0 times per week. Cindy Carey has struggled with stress and frustration at home.  Her weight is 261 lb (118.4 kg) today and has had a weight loss of 1 pound over a period of 2 weeks since her last visit. She has lost 1 lb since starting treatment with [name].  Diabetes II Cindy Carey has a diagnosis of diabetes type II. Cindy Carey states that her fasting BG's range between 90 and 100 and she is taking metformin 500mg  and Trulicity 1.5mg . Last A1c was 7.0 on 07/13/18. She denies any hypoglycemic episodes. She has been working on intensive lifestyle modifications including diet, exercise, and weight loss to help control her blood glucose levels.  Hypertension ADDALEIGH Carey is a 62 y.o. female with hypertension. She is working on weight loss to help control her blood pressure with the goal of decreasing her risk of heart attack and stroke. Cindy Carey is taking lisinopril 10mg  and her blood pressure is currently well controlled. Regine denies lightheadedness.  ALLERGIES: Allergies  Allergen Reactions  . Bee Venom Anaphylaxis  . Shellfish Allergy Anaphylaxis  . Penicillins Hives    MEDICATIONS: Current Outpatient Medications on File Prior to Visit  Medication Sig Dispense Refill  . ACCU-CHEK FASTCLIX LANCETS MISC USE TO CHECK BLOOD SUGAR TWICE A DAY AS DIRECTED 102 each 3  . ACCU-CHEK GUIDE test strip USE TO CHECK BLOOD SUGAR TWICE A DAY AS DIRECTED 100 each 3  . albuterol (PROVENTIL HFA;VENTOLIN HFA) 108 (90 BASE) MCG/ACT inhaler Inhale 2 puffs into the lungs every 6 (six) hours as needed for wheezing. Reported on 03/31/2016    . aspirin EC 81 MG tablet Take 81 mg by mouth daily.    . Biotin 10000 MCG TABS Take by mouth.    . Calcium  Carbonate-Vit D-Min (CALCIUM 600+D PLUS MINERALS) 600-400 MG-UNIT TABS Take 1 tablet by mouth 2 (two) times daily.    . cyclobenzaprine (FLEXERIL) 10 MG tablet Take 1 tablet (10 mg total) by mouth 3 (three) times daily as needed for muscle spasms. 30 tablet 0  . Dulaglutide (TRULICITY) 1.5 LP/3.7TK SOPN INJECT 1.5 MG INTO THE SKIN EVERY 7 DAYS. 6 mL 2  . EPINEPHrine 0.3 mg/0.3 mL IJ SOAJ injection Inject 0.3 mLs (0.3 mg total) into the muscle once as needed. Reported on 04/30/2016 1 Device 0  . lisinopril (PRINIVIL,ZESTRIL) 10 MG tablet Take 1 tablet (10 mg total) by mouth daily. 90 tablet 0  . Magnesium 250 MG TABS Take 250 mg by mouth daily.    . metFORMIN (GLUCOPHAGE-XR) 500 MG 24 hr tablet TAKE 2 TABLETS BY MOUTH 2 TIMES DAILY BEFORE A MEAL. 360 tablet 0  . METRONIDAZOLE, TOPICAL, 0.75 % LOTN Apply 1 application topically 2 (two) times daily. 59 mL 12  . Multiple Vitamins-Minerals (MULTIVITAMIN PO) Take 1 tablet by mouth daily. Reported on 03/31/2016    . promethazine (PHENERGAN) 25 MG tablet Take 1 tablet (25 mg total) by mouth every 8 (eight) hours as needed for nausea. 30 tablet 0  . simvastatin (ZOCOR) 40 MG tablet Take 1 tablet (40 mg total) by mouth every evening. 90 tablet 0  . tamoxifen (NOLVADEX) 20 MG tablet Take 1 tablet (20 mg total) by mouth daily.  90 tablet 3   No current facility-administered medications on file prior to visit.     PAST MEDICAL HISTORY: Past Medical History:  Diagnosis Date  . Anemia   . Asthma   . Blood transfusion without reported diagnosis 1983  . Diabetes mellitus without complication ([place]) 6433   T2DM  . Dyspareunia   . Exercise-induced asthma    worse in winter  . H/O bone density study 2013  . H/O cold sores   . H/O colonoscopy 2009  . History of endometriosis   . Hyperlipidemia   . Hypertension   . Injury of tendon of rotator cuff 2015   gym injury (right)  . Knee pain 08/2015   L>R  . Migraine with typical aura   . Mild CAD 04/04/2018    Mild disease on coronary CT-A 01/2018.  . Morbid obesity ([place]) 09/04/12   BMI 49.4 kg/m^2   . Osteoarthritis     PAST SURGICAL HISTORY: Past Surgical History:  Procedure Laterality Date  . BREAST BIOPSY  2010  . BREAST LUMPECTOMY  2010  . BREAST LUMPECTOMY WITH RADIOACTIVE SEED LOCALIZATION Left 07/23/2015   Procedure: LEFT BREAST SEED GUIDED EXCISION;  Surgeon: Rolm Bookbinder, MD;  Location: [place];  Service: General;  Laterality: Left;  . BREATH TEK H PYLORI  08/28/2012   Procedure: BREATH TEK H PYLORI;  Surgeon: Pedro Earls, MD;  Location: [facility];  Service: General;  Laterality: N/A;  . CHOLECYSTECTOMY  2007  . EXPLORATORY LAPAROTOMY  1983   endometriosis  . GANGLION CYST EXCISION Left 1999   Foot  . TOTAL ABDOMINAL HYSTERECTOMY W/ BILATERAL SALPINGOOPHORECTOMY  1984   endometriosis (and appendectomy)    SOCIAL HISTORY: Social History   Tobacco Use  . Smoking status: Never Smoker  . Smokeless tobacco: Never Used  Substance Use Topics  . Alcohol use: No  . Drug use: No    FAMILY HISTORY: Family History  Problem Relation Age of Onset  . Diabetes Father   . Heart attack Father 42  . Heart disease Father   . Hypertension Father   . Peripheral Artery Disease Father        stents at 68  . Diabetes Paternal Grandfather   . Heart disease Paternal Grandfather   . Osteoporosis Maternal Grandmother   . Heart failure Maternal Grandmother   . Thyroid disease Mother        hypothyroidism  . Heart disease Mother 50       CABG at 45  . Hypertension Mother   . Diverticulitis Mother 12       perforation, required surgery  . Pneumonia Sister   . Cancer Maternal Grandfather        lung  . Dementia Paternal Grandmother   . Cancer Paternal Grandmother        melanoma on her foot (not related to death)  . Breast cancer Paternal Aunt        38's  . Cancer Maternal Aunt        lung  . Cancer Maternal Uncle        lung  . Breast cancer Cousin         mets to lung  . Congestive Heart Failure Maternal Uncle   . Breast cancer Cousin     ROS: Review of Systems  Constitutional: Negative for weight loss.  Neurological:       Negative for lightheadedness.  Endo/Heme/Allergies:       Negative for hypoglycemia.  PHYSICAL EXAM: Blood pressure 100/63, pulse 70, temperature 97.6 F (36.4 C), temperature source Oral, height 5\' 3"  (1.6 m), weight 261 lb (118.4 kg), last menstrual period 10/18/1982, SpO2 96 %. Body mass index is 46.23 kg/m. Physical Exam  Constitutional: She is oriented to person, place, and time. She appears well-developed and well-nourished.  Cardiovascular: Normal rate.  Pulmonary/Chest: Effort normal.  Musculoskeletal: Normal range of motion.  Neurological: She is oriented to person, place, and time.  Skin: Skin is dry.  Psychiatric: She has a normal mood and affect. Her behavior is normal.  Vitals reviewed.   RECENT LABS AND TESTS: BMET    Component Value Date/Time   NA 139 07/13/2018 1031   NA 139 11/21/2015 0826   K 4.5 07/13/2018 1031   K 4.7 11/21/2015 0826   CL 102 07/13/2018 1031   CO2 18 (L) 07/13/2018 1031   CO2 24 11/21/2015 0826   GLUCOSE 113 (H) 07/13/2018 1031   GLUCOSE 96 09/30/2016 0811   GLUCOSE 125 11/21/2015 0826   BUN 12 07/13/2018 1031   BUN 12.5 11/21/2015 0826   CREATININE 0.83 07/13/2018 1031   CREATININE 0.93 09/30/2016 0811   CREATININE 1.0 11/21/2015 0826   CALCIUM 9.2 07/13/2018 1031   CALCIUM 9.6 11/21/2015 0826   GFRNONAA 77 07/13/2018 1031   GFRAA 89 07/13/2018 1031   Lab Results  Component Value Date   HGBA1C 7.0 (H) 07/13/2018   HGBA1C 6.0 (A) 04/13/2018   HGBA1C 6.2 12/07/2017   HGBA1C 6.1 01/05/2017   HGBA1C 6.0 09/30/2016   Lab Results  Component Value Date   INSULIN 59.1 (H) 07/13/2018   CBC    Component Value Date/Time   WBC 8.7 12/07/2017 1619   WBC 7.8 09/30/2016 0811   RBC 4.57 12/07/2017 1619   RBC 4.92 09/30/2016 0811   HGB 14.1  12/07/2017 1619   HGB 15.1 11/21/2015 0826   HCT 42.5 12/07/2017 1619   HCT 46.3 11/21/2015 0826   PLT 121 (L) 12/07/2017 1619   MCV 93 12/07/2017 1619   MCV 95.1 11/21/2015 0826   MCH 30.9 12/07/2017 1619   MCH 32.3 09/30/2016 0811   MCHC 33.2 12/07/2017 1619   MCHC 33.1 09/30/2016 0811   RDW 14.3 12/07/2017 1619   RDW 15.1 (H) 11/21/2015 0826   LYMPHSABS 3.5 (H) 12/07/2017 1619   LYMPHSABS 2.6 11/21/2015 0826   MONOABS 546 09/30/2016 0811   MONOABS 0.7 11/21/2015 0826   EOSABS 0.2 12/07/2017 1619   BASOSABS 0.0 12/07/2017 1619   BASOSABS 0.0 11/21/2015 0826   Iron/TIBC/Ferritin/ %Sat    Component Value Date/Time   IRON 84 12/07/2017 1619   Lipid Panel     Component Value Date/Time   CHOL 133 12/07/2017 1619   TRIG 69 12/07/2017 1619   HDL 71 12/07/2017 1619   CHOLHDL 1.9 12/07/2017 1619   CHOLHDL 2.3 09/30/2016 0811   VLDL 26 09/30/2016 0811   LDLCALC 48 12/07/2017 1619   Hepatic Function Panel     Component Value Date/Time   PROT 6.5 07/13/2018 1031   PROT 7.2 11/21/2015 0826   ALBUMIN 4.0 07/13/2018 1031   ALBUMIN 3.8 11/21/2015 0826   AST 51 (H) 07/13/2018 1031   AST 24 11/21/2015 0826   ALT 24 07/13/2018 1031   ALT 24 11/21/2015 0826   ALKPHOS 93 07/13/2018 1031   ALKPHOS 75 11/21/2015 0826   BILITOT 0.4 07/13/2018 1031   BILITOT 0.58 11/21/2015 0826      Component Value Date/Time   TSH  2.200 12/07/2017 1619   TSH 1.79 03/31/2016 0859   TSH 2.711 09/06/2012 0741   Results for AREAL, COCHRANE (MRN [id number]) as of 09/12/2018 15:40  Ref. Range 07/13/2018 10:31  Vitamin D, 25-Hydroxy Latest Ref Range: 30.0 - 100.0 ng/mL 50.0   ASSESSMENT AND PLAN: Type 2 diabetes mellitus without complication, without long-term current use of insulin (HCC)  Essential hypertension  Class 3 severe obesity with serious comorbidity and body mass index (BMI) of 45.0 to 49.9 in adult, unspecified obesity type ([place])  PLAN:  Diabetes II Money has been given  extensive diabetes education by myself today including ideal fasting and post-prandial blood glucose readings, individual ideal Hgb A1c goals, and hypoglycemia prevention. We discussed the importance of good blood sugar control to decrease the likelihood of diabetic complications such as nephropathy, neuropathy, limb loss, blindness, coronary artery disease, and death. We discussed the importance of intensive lifestyle modification including diet, exercise and weight loss as the first line treatment for diabetes. Toshua agrees to continue her diabetes medications and will follow up at the agreed upon time.  Hypertension We discussed sodium restriction, working on healthy weight loss, and a regular exercise program as the means to achieve improved blood pressure control. We will continue to monitor her blood pressure as well as her progress with the above lifestyle modifications. She agrees to continue her medications as prescribed and will watch for signs of hypotension as she continues her lifestyle modifications. Elfida agreed with this plan and agreed to follow up as directed.   I spent > than 50% of the 15 minute visit on counseling as documented in the note.  Obesity Nalany is currently in the action stage of change. As such, her goal is to continue with weight loss efforts. She has agreed to the [employer]. She was given the "Thanksgiving" handout. Norman has been instructed to work up to a goal of 150 minutes of combined cardio and strengthening exercise per week for weight loss and overall health benefits. We discussed the following Behavioral Modification Strategies today: increasing lean protein intake, decreasing simple carbohydrates, increasing vegetables, travel eating strategies, and holiday eating strategies.   Cindy Carey has agreed to follow up with our clinic in 2 weeks. She was informed of the importance of frequent follow up visits to maximize her success with intensive lifestyle  modifications for her multiple health conditions.   OBESITY BEHAVIORAL INTERVENTION VISIT  Today's visit was # 5   Starting weight: 262 lbs Starting date: 07/13/18 Today's weight : Weight: 261 lb (118.4 kg)  Today's date: 09/07/2018 Total lbs lost to date: 1  ASK: We discussed the diagnosis of obesity with Cindy Carey today and Giavanni agreed to give [name] permission to discuss obesity behavioral modification therapy today.  ASSESS: Adlai has the diagnosis of obesity and her BMI today is 46.25. Dulcy is in the action stage of change.   ADVISE: Rory was educated on the multiple health risks of obesity as well as the benefit of weight loss to improve her health. She was advised of the need for long term treatment and the importance of lifestyle modifications to improve her current health and to decrease her risk of future health problems.  AGREE: Multiple dietary modification options and treatment options were discussed and Muna agreed to follow the recommendations documented in the above note.  ARRANGE: Aralyn was educated on the importance of frequent visits to treat obesity as outlined per CMS and USPSTF guidelines and agreed to schedule her next  follow up appointment today.  I, Marcille Blanco, am acting as Location manager for [employer]. Owens Shark, DO  I have reviewed the above documentation for accuracy and completeness, and I agree with the above. -Jearld Lesch, DO

## 2018-09-20 ENCOUNTER — Encounter: Payer: Self-pay | Admitting: Family Medicine

## 2018-09-25 ENCOUNTER — Encounter: Payer: Self-pay | Admitting: Obstetrics and Gynecology

## 2018-09-25 ENCOUNTER — Other Ambulatory Visit: Payer: Self-pay

## 2018-09-25 ENCOUNTER — Other Ambulatory Visit: Payer: Self-pay | Admitting: Family Medicine

## 2018-09-25 ENCOUNTER — Ambulatory Visit (INDEPENDENT_AMBULATORY_CARE_PROVIDER_SITE_OTHER): Payer: No Typology Code available for payment source | Admitting: Obstetrics and Gynecology

## 2018-09-25 ENCOUNTER — Ambulatory Visit (INDEPENDENT_AMBULATORY_CARE_PROVIDER_SITE_OTHER): Payer: No Typology Code available for payment source | Admitting: Family Medicine

## 2018-09-25 ENCOUNTER — Encounter (INDEPENDENT_AMBULATORY_CARE_PROVIDER_SITE_OTHER): Payer: Self-pay | Admitting: Family Medicine

## 2018-09-25 VITALS — BP 117/79 | HR 79 | Temp 97.5°F | Ht 63.0 in | Wt 262.0 lb

## 2018-09-25 VITALS — BP 120/68 | HR 76 | Ht 63.0 in | Wt 266.0 lb

## 2018-09-25 DIAGNOSIS — Z01419 Encounter for gynecological examination (general) (routine) without abnormal findings: Secondary | ICD-10-CM

## 2018-09-25 DIAGNOSIS — Z6841 Body Mass Index (BMI) 40.0 and over, adult: Secondary | ICD-10-CM

## 2018-09-25 DIAGNOSIS — E119 Type 2 diabetes mellitus without complications: Secondary | ICD-10-CM

## 2018-09-25 DIAGNOSIS — M8580 Other specified disorders of bone density and structure, unspecified site: Secondary | ICD-10-CM

## 2018-09-25 DIAGNOSIS — F3289 Other specified depressive episodes: Secondary | ICD-10-CM | POA: Diagnosis not present

## 2018-09-25 MED ORDER — METFORMIN HCL ER 500 MG PO TB24: 1000.0000 mg | ORAL_TABLET | Freq: Two times a day (BID) | ORAL | 0 refills | Status: DC

## 2018-09-25 MED FILL — metFORMIN HCL ER 500 MG TB2: 500 | 90 days supply | Qty: 360 | Fill #0

## 2018-09-25 NOTE — Progress Notes (Signed)
Office: [phone]  /  Fax: [phone]   HPI:   Chief Complaint: OBESITY Cindy Carey is here to discuss her progress with her obesity treatment plan. She is on the  Pescatarian plan  and is following her eating plan approximately 50 % of the time. She states she is exercising by doing cardio for 30 minutes 2 times per week. Cindy Carey is not eating yogurt in the AM because it is cold outside. She has increased stress eating related to mother-in-law who is in assisted living and not adjusting well. She has not had time to meal plan recently.  Her weight is 262 lb (118.8 kg) today and has had a weight gain of 1 pound over a period of 2 weeks since her last visit. She has lost 0 lbs since starting treatment with [name].  Diabetes II Cindy Carey has a diagnosis of diabetes type II. Cindy Carey states fasting BGs range between 90 and 105 and 2 hour postprandial BGs range between 120-130s. She denies any hypoglycemic episodes. Last A1c was 7.0.  She has been working on intensive lifestyle modifications including diet, exercise, and weight loss to help control her blood glucose levels. She is currently on metformin and trulicity.   Depression with emotional eating behaviors Cindy Carey is struggling with emotional eating and using food for comfort to the extent that it is negatively impacting her health. She often snacks when she is not hungry. Cindy Carey sometimes feels she is out of control and then feels guilty that she made poor food choices. She has been working on behavior modification techniques to help reduce her emotional eating and has been somewhat successful. She admits to some stress eating from situation with mother-in-law. She refused counseling with Dr. Mallie Mussel and Wellbutrin.  She shows no sign of suicidal or homicidal ideations.  Depression screen Cindy Carey 2/9 07/13/2018 05/24/2018 12/07/2017 03/31/2016 06/30/2015  Decreased Interest 3 0 0 0 0  Down, Depressed, Hopeless 0 0 0 0 0  PHQ - 2 Score 3 0 0 0 0  Altered sleeping 0 - - - -    Tired, decreased energy 3 - - - -  Change in appetite 0 - - - -  Feeling bad or failure about yourself  1 - - - -  Trouble concentrating 1 - - - -  Moving slowly or fidgety/restless 1 - - - -  Suicidal thoughts 0 - - - -  PHQ-9 Score 9 - - - -  Difficult doing work/chores Not difficult at all - - - -     ALLERGIES: Allergies  Allergen Reactions  . Bee Venom Anaphylaxis  . Shellfish Allergy Anaphylaxis  . Penicillins Hives    MEDICATIONS: Current Outpatient Medications on File Prior to Visit  Medication Sig Dispense Refill  . ACCU-CHEK FASTCLIX LANCETS MISC USE TO CHECK BLOOD SUGAR TWICE A DAY AS DIRECTED 102 each 3  . ACCU-CHEK GUIDE test strip USE TO CHECK BLOOD SUGAR TWICE A DAY AS DIRECTED 100 each 3  . albuterol (PROVENTIL HFA;VENTOLIN HFA) 108 (90 BASE) MCG/ACT inhaler Inhale 2 puffs into the lungs every 6 (six) hours as needed for wheezing. Reported on 03/31/2016    . aspirin EC 81 MG tablet Take 81 mg by mouth daily.    . Biotin 10000 MCG TABS Take by mouth.    . Calcium Carbonate-Vit D-Min (CALCIUM 600+D PLUS MINERALS) 600-400 MG-UNIT TABS Take 1 tablet by mouth 2 (two) times daily.    . cyclobenzaprine (FLEXERIL) 10 MG tablet Take 1 tablet (10 mg  total) by mouth 3 (three) times daily as needed for muscle spasms. 30 tablet 0  . Dulaglutide (TRULICITY) 1.5 UR/4.2HC SOPN INJECT 1.5 MG INTO THE SKIN EVERY 7 DAYS. 6 mL 2  . EPINEPHrine 0.3 mg/0.3 mL IJ SOAJ injection Inject 0.3 mLs (0.3 mg total) into the muscle once as needed. Reported on 04/30/2016 1 Device 0  . lisinopril (PRINIVIL,ZESTRIL) 10 MG tablet Take 1 tablet (10 mg total) by mouth daily. 90 tablet 0  . Magnesium 250 MG TABS Take 250 mg by mouth daily.    [name] METRONIDAZOLE, TOPICAL, 0.75 % LOTN Apply 1 application topically 2 (two) times daily. 59 mL 12  . Multiple Vitamins-Minerals (MULTIVITAMIN PO) Take 1 tablet by mouth daily. Reported on 03/31/2016    . promethazine (PHENERGAN) 25 MG tablet Take 1 tablet (25 mg  total) by mouth every 8 (eight) hours as needed for nausea. 30 tablet 0  . simvastatin (ZOCOR) 40 MG tablet Take 1 tablet (40 mg total) by mouth every evening. 90 tablet 0  . tamoxifen (NOLVADEX) 20 MG tablet Take 1 tablet (20 mg total) by mouth daily. 90 tablet 3   No current facility-administered medications on file prior to visit.     PAST MEDICAL HISTORY: Past Medical History:  Diagnosis Date  . Anemia   . Asthma   . Blood transfusion without reported diagnosis 1983  . Diabetes mellitus without complication ([place]) 6237   T2DM  . Dyspareunia   . Exercise-induced asthma    worse in winter  . H/O bone density study 2013  . H/O cold sores   . H/O colonoscopy 2009  . History of endometriosis   . Hyperlipidemia   . Hypertension   . Injury of tendon of rotator cuff 2015   gym injury (right)  . Knee pain 08/2015   L>R  . Migraine with typical aura   . Mild CAD 04/04/2018   Mild disease on coronary CT-A 01/2018.  . Morbid obesity ([place]) 09/04/12   BMI 49.4 kg/m^2   . Osteoarthritis   . Trigger finger of left thumb 08/2018   injected by Dr. Fredna Dow 09/18/18    PAST SURGICAL HISTORY: Past Surgical History:  Procedure Laterality Date  . BREAST BIOPSY  2010  . BREAST LUMPECTOMY  2010  . BREAST LUMPECTOMY WITH RADIOACTIVE SEED LOCALIZATION Left 07/23/2015   Procedure: LEFT BREAST SEED GUIDED EXCISION;  Surgeon: Rolm Bookbinder, MD;  Location: [facility];  Service: General;  Laterality: Left;  . BREATH TEK H PYLORI  08/28/2012   Procedure: BREATH TEK H PYLORI;  Surgeon: Pedro Earls, MD;  Location: [facility];  Service: General;  Laterality: N/A;  . CHOLECYSTECTOMY  2007  . EXPLORATORY LAPAROTOMY  1983   endometriosis  . GANGLION CYST EXCISION Left 1999   Foot  . TOTAL ABDOMINAL HYSTERECTOMY W/ BILATERAL SALPINGOOPHORECTOMY  1984   endometriosis (and appendectomy)    SOCIAL HISTORY: Social History   Tobacco Use  . Smoking status: Never Smoker  .  Smokeless tobacco: Never Used  Substance Use Topics  . Alcohol use: No  . Drug use: No    FAMILY HISTORY: Family History  Problem Relation Age of Onset  . Diabetes Father   . Heart attack Father 32  . Heart disease Father   . Hypertension Father   . Peripheral Artery Disease Father        stents at 67  . Diabetes Paternal Grandfather   . Heart disease Paternal Grandfather   . Osteoporosis  Maternal Grandmother   . Heart failure Maternal Grandmother   . Thyroid disease Mother        hypothyroidism  . Heart disease Mother 56       CABG at 22  . Hypertension Mother   . Diverticulitis Mother 36       perforation, required surgery  . Pneumonia Sister   . Cancer Maternal Grandfather        lung  . Dementia Paternal Grandmother   . Cancer Paternal Grandmother        melanoma on her foot (not related to death)  . Breast cancer Paternal Aunt        26's  . Cancer Maternal Aunt        lung  . Cancer Maternal Uncle        lung  . Breast cancer Cousin        mets to lung  . Congestive Heart Failure Maternal Uncle   . Breast cancer Cousin     ROS: Review of Systems  Constitutional: Negative for weight loss.  Endo/Heme/Allergies:       Negative for hypoglycemia  Psychiatric/Behavioral: Positive for depression. Negative for suicidal ideas.       Negative for homicidal ideations     PHYSICAL EXAM: Blood pressure 117/79, pulse 79, temperature (!) 97.5 F (36.4 C), temperature source Oral, height 5\' 3"  (1.6 m), weight 262 lb (118.8 kg), last menstrual period 10/18/1982, SpO2 94 %. Body mass index is 46.41 kg/m. Physical Exam  Constitutional: She is oriented to person, place, and time. She appears well-developed and well-nourished.  HENT:  Head: Normocephalic.  Eyes: Pupils are equal, round, and reactive to light.  Neck: Normal range of motion.  Cardiovascular: Normal rate.  Pulmonary/Chest: Effort normal.  Musculoskeletal: Normal range of motion.  Neurological: She is  alert and oriented to person, place, and time.  Skin: Skin is warm and dry.  Psychiatric: She has a normal mood and affect. Her behavior is normal.  Vitals reviewed.   RECENT LABS AND TESTS: BMET    Component Value Date/Time   NA 139 07/13/2018 1031   NA 139 11/21/2015 0826   K 4.5 07/13/2018 1031   K 4.7 11/21/2015 0826   CL 102 07/13/2018 1031   CO2 18 (L) 07/13/2018 1031   CO2 24 11/21/2015 0826   GLUCOSE 113 (H) 07/13/2018 1031   GLUCOSE 96 09/30/2016 0811   GLUCOSE 125 11/21/2015 0826   BUN 12 07/13/2018 1031   BUN 12.5 11/21/2015 0826   CREATININE 0.83 07/13/2018 1031   CREATININE 0.93 09/30/2016 0811   CREATININE 1.0 11/21/2015 0826   CALCIUM 9.2 07/13/2018 1031   CALCIUM 9.6 11/21/2015 0826   GFRNONAA 77 07/13/2018 1031   GFRAA 89 07/13/2018 1031   Lab Results  Component Value Date   HGBA1C 7.0 (H) 07/13/2018   HGBA1C 6.0 (A) 04/13/2018   HGBA1C 6.2 12/07/2017   HGBA1C 6.1 01/05/2017   HGBA1C 6.0 09/30/2016   Lab Results  Component Value Date   INSULIN 59.1 (H) 07/13/2018   CBC    Component Value Date/Time   WBC 8.7 12/07/2017 1619   WBC 7.8 09/30/2016 0811   RBC 4.57 12/07/2017 1619   RBC 4.92 09/30/2016 0811   HGB 14.1 12/07/2017 1619   HGB 15.1 11/21/2015 0826   HCT 42.5 12/07/2017 1619   HCT 46.3 11/21/2015 0826   PLT 121 (L) 12/07/2017 1619   MCV 93 12/07/2017 1619   MCV 95.1 11/21/2015 0826  MCH 30.9 12/07/2017 1619   MCH 32.3 09/30/2016 0811   MCHC 33.2 12/07/2017 1619   MCHC 33.1 09/30/2016 0811   RDW 14.3 12/07/2017 1619   RDW 15.1 (H) 11/21/2015 0826   LYMPHSABS 3.5 (H) 12/07/2017 1619   LYMPHSABS 2.6 11/21/2015 0826   MONOABS 546 09/30/2016 0811   MONOABS 0.7 11/21/2015 0826   EOSABS 0.2 12/07/2017 1619   BASOSABS 0.0 12/07/2017 1619   BASOSABS 0.0 11/21/2015 0826   Iron/TIBC/Ferritin/ %Sat    Component Value Date/Time   IRON 84 12/07/2017 1619   Lipid Panel     Component Value Date/Time   CHOL 133 12/07/2017 1619    TRIG 69 12/07/2017 1619   HDL 71 12/07/2017 1619   CHOLHDL 1.9 12/07/2017 1619   CHOLHDL 2.3 09/30/2016 0811   VLDL 26 09/30/2016 0811   LDLCALC 48 12/07/2017 1619   Hepatic Function Panel     Component Value Date/Time   PROT 6.5 07/13/2018 1031   PROT 7.2 11/21/2015 0826   ALBUMIN 4.0 07/13/2018 1031   ALBUMIN 3.8 11/21/2015 0826   AST 51 (H) 07/13/2018 1031   AST 24 11/21/2015 0826   ALT 24 07/13/2018 1031   ALT 24 11/21/2015 0826   ALKPHOS 93 07/13/2018 1031   ALKPHOS 75 11/21/2015 0826   BILITOT 0.4 07/13/2018 1031   BILITOT 0.58 11/21/2015 0826      Component Value Date/Time   TSH 2.200 12/07/2017 1619   TSH 1.79 03/31/2016 0859   TSH 2.711 09/06/2012 0741    ASSESSMENT AND PLAN: Type 2 diabetes mellitus without complication, without long-term current use of insulin (HCC)  Other depression - with emotional eating  Class 3 severe obesity with serious comorbidity and body mass index (BMI) of 45.0 to 49.9 in adult, unspecified obesity type ([place])  PLAN: Diabetes II Cindy Carey has been given extensive diabetes education by myself today including ideal fasting and post-prandial blood glucose readings, individual ideal HgA1c goals  and hypoglycemia prevention. We discussed the importance of good blood sugar control to decrease the likelihood of diabetic complications such as nephropathy, neuropathy, limb loss, blindness, coronary artery disease, and death. We discussed the importance of intensive lifestyle modification including diet, exercise and weight loss as the first line treatment for diabetes. Cindy Carey agrees to continue her diabetes medications and will follow up at the agreed upon time.  Depression with Emotional Eating Behaviors We discussed behavior modification techniques today to help Cindy Carey deal with her emotional eating and depression. Agrees to follow up with our clinic as directed.   I spent > than 50% of the 15 minute visit on counseling as documented in the  note.  Obesity Cindy Carey is currently in the action stage of change. As such, her goal is to continue with weight loss efforts  She has agreed to follow the Pescatarian plan or 1500 calories and 100g of protein daily.  She may journal as an option.  Cindy Carey has been instructed to work up to a goal of 150 minutes of combined cardio and strengthening exercise per week for weight loss and overall health benefits. We discussed the following Behavioral Modification Strategies today: increasing lean protein intake, planning for success, work on meal planning and easy cooking plans and emotional eating strategies   Cindy Carey has agreed to follow up with our clinic in 2 weeks. She was informed of the importance of frequent follow up visits to maximize her success with intensive lifestyle modifications for her multiple health conditions.   OBESITY BEHAVIORAL INTERVENTION VISIT  Today's visit was # 6   Starting weight: 262 lb Starting date: 07/13/18 Today's weight : Weight: 262 lb (118.8 kg)  Today's date: 09/25/2018 Total lbs lost to date: 0    ASK: We discussed the diagnosis of obesity with Cindy Carey today and Cindy Carey agreed to give [name] permission to discuss obesity behavioral modification therapy today.  ASSESS: Cindy Carey has the diagnosis of obesity and her BMI today is 47.13  Cindy Carey is in the action stage of change   ADVISE: Cindy Carey was educated on the multiple health risks of obesity as well as the benefit of weight loss to improve her health. She was advised of the need for long term treatment and the importance of lifestyle modifications to improve her current health and to decrease her risk of future health problems.  AGREE: Multiple dietary modification options and treatment options were discussed and  Cindy Carey agreed to follow the recommendations documented in the above note.  ARRANGE: Cindy Carey was educated on the importance of frequent visits to treat obesity as outlined per CMS and USPSTF guidelines  and agreed to schedule her next follow up appointment today.  I, Renee Ramus, am acting as Location manager for [employer], FNP-C.  I have reviewed the above documentation for accuracy and completeness, and I agree with the above.  - Dawn Whitmire, FNP-C.

## 2018-09-25 NOTE — Patient Instructions (Signed)

## 2018-09-25 NOTE — Progress Notes (Signed)
62 y.o. Cindy Carey Married White or Caucasian Not Hispanic or Latino female here for annual exam.  H/O TAH/BSO. Not sexually active.  She has long term issues with intermittent vulvar pruritis and irritation. Currently under control with Vaseline.  Small amount of GSI daily, annoying but tolerable.  Last HgbA1C was 7 in 9/19.    Patient's last menstrual period was 10/18/1982.          Sexually active: No.  The current method of family planning is status post hysterectomy.    Exercising: Yes.    treadmill, bike Smoker:  no  Health Maintenance: Pap:  2011 History of abnormal Pap:  no MMG:  07/20/2018 Birads 1 negative Colonoscopy: 02/20/2018 polyp, f/u 10 years BMD:   09/30/2017 Osteopenia T score of -1.3, FRAX 12.7/0.4% TDaP:  2016    reports that she has never smoked. She has never used smokeless tobacco. She reports that she does not drink alcohol or use drugs. She is a Pediatric NP  Past Medical History:  Diagnosis Date  . Anemia   . Asthma   . Blood transfusion without reported diagnosis 1983  . Diabetes mellitus without complication ([place]) 1660   T2DM  . Dyspareunia   . Exercise-induced asthma    worse in winter  . H/O bone density study 2013  . H/O cold sores   . H/O colonoscopy 2009  . History of endometriosis   . Hyperlipidemia   . Hypertension   . Injury of tendon of rotator cuff 2015   gym injury (right)  . Knee pain 08/2015   L>R  . Migraine with typical aura   . Mild CAD 04/04/2018   Mild disease on coronary CT-A 01/2018.  . Morbid obesity ([place]) 09/04/12   BMI 49.4 kg/m^2   . Osteoarthritis   . Trigger finger of left thumb 08/2018   injected by Dr. Fredna Dow 09/18/18    Past Surgical History:  Procedure Laterality Date  . BREAST BIOPSY  2010  . BREAST LUMPECTOMY  2010  . BREAST LUMPECTOMY WITH RADIOACTIVE SEED LOCALIZATION Left 07/23/2015   Procedure: LEFT BREAST SEED GUIDED EXCISION;  Surgeon: Rolm Bookbinder, MD;  Location: [facility];   Service: General;  Laterality: Left;  . BREATH TEK H PYLORI  08/28/2012   Procedure: BREATH TEK H PYLORI;  Surgeon: Pedro Earls, MD;  Location: [facility];  Service: General;  Laterality: N/A;  . CHOLECYSTECTOMY  2007  . EXPLORATORY LAPAROTOMY  1983   endometriosis  . GANGLION CYST EXCISION Left 1999   Foot  . TOTAL ABDOMINAL HYSTERECTOMY W/ BILATERAL SALPINGOOPHORECTOMY  1984   endometriosis (and appendectomy)    Current Outpatient Medications  Medication Sig Dispense Refill  . ACCU-CHEK FASTCLIX LANCETS MISC USE TO CHECK BLOOD SUGAR TWICE A DAY AS DIRECTED 102 each 3  . ACCU-CHEK GUIDE test strip USE TO CHECK BLOOD SUGAR TWICE A DAY AS DIRECTED 100 each 3  . albuterol (PROVENTIL HFA;VENTOLIN HFA) 108 (90 BASE) MCG/ACT inhaler Inhale 2 puffs into the lungs every 6 (six) hours as needed for wheezing. Reported on 03/31/2016    . aspirin EC 81 MG tablet Take 81 mg by mouth daily.    . Biotin 10000 MCG TABS Take by mouth.    . Calcium Carbonate-Vit D-Min (CALCIUM 600+D PLUS MINERALS) 600-400 MG-UNIT TABS Take 1 tablet by mouth 2 (two) times daily.    . cyclobenzaprine (FLEXERIL) 10 MG tablet Take 1 tablet (10 mg total) by mouth 3 (three) times daily as needed for  muscle spasms. 30 tablet 0  . Dulaglutide (TRULICITY) 1.5 JO/8.4ZY SOPN INJECT 1.5 MG INTO THE SKIN EVERY 7 DAYS. 6 mL 2  . EPINEPHrine 0.3 mg/0.3 mL IJ SOAJ injection Inject 0.3 mLs (0.3 mg total) into the muscle once as needed. Reported on 04/30/2016 1 Device 0  . lisinopril (PRINIVIL,ZESTRIL) 10 MG tablet Take 1 tablet (10 mg total) by mouth daily. 90 tablet 0  . Magnesium 250 MG TABS Take 250 mg by mouth daily.    . metFORMIN (GLUCOPHAGE-XR) 500 MG 24 hr tablet Take 2 tablets (1,000 mg total) by mouth 2 (two) times daily. 360 tablet 0  . METRONIDAZOLE, TOPICAL, 0.75 % LOTN Apply 1 application topically 2 (two) times daily. 59 mL 12  . Multiple Vitamins-Minerals (MULTIVITAMIN PO) Take 1 tablet by mouth daily. Reported on  03/31/2016    . promethazine (PHENERGAN) 25 MG tablet Take 1 tablet (25 mg total) by mouth every 8 (eight) hours as needed for nausea. 30 tablet 0  . simvastatin (ZOCOR) 40 MG tablet Take 1 tablet (40 mg total) by mouth every evening. 90 tablet 0  . tamoxifen (NOLVADEX) 20 MG tablet Take 1 tablet (20 mg total) by mouth daily. 90 tablet 3   No current facility-administered medications for this visit.     Family History  Problem Relation Age of Onset  . Diabetes Father   . Heart attack Father 21  . Heart disease Father   . Hypertension Father   . Peripheral Artery Disease Father        stents at 11  . Diabetes Paternal Grandfather   . Heart disease Paternal Grandfather   . Osteoporosis Maternal Grandmother   . Heart failure Maternal Grandmother   . Thyroid disease Mother        hypothyroidism  . Heart disease Mother 54       CABG at 62  . Hypertension Mother   . Diverticulitis Mother 77       perforation, required surgery  . Pneumonia Sister   . Cancer Maternal Grandfather        lung  . Dementia Paternal Grandmother   . Cancer Paternal Grandmother        melanoma on her foot (not related to death)  . Breast cancer Paternal Aunt        24's  . Cancer Maternal Aunt        lung  . Cancer Maternal Uncle        lung  . Breast cancer Cousin        mets to lung  . Congestive Heart Failure Maternal Uncle   . Breast cancer Cousin     Review of Systems  Constitutional: Negative.   HENT: Negative.   Eyes: Negative.   Respiratory: Negative.   Cardiovascular: Negative.   Gastrointestinal: Negative.   Endocrine: Negative.   Genitourinary:       Loss of urine cough/sneeze  Musculoskeletal: Negative.   Skin: Negative.   Allergic/Immunologic: Negative.   Neurological: Negative.   Hematological: Negative.   Psychiatric/Behavioral: Negative.     Exam:   BP 120/68 (BP Location: Right Arm, Patient Position: Sitting, Cuff Size: Normal)   Pulse 76   Ht 5\' 3"  (1.6 m)   Wt 266  lb (120.7 kg)   LMP 10/18/1982   BMI 47.12 kg/m   Weight change: @WEIGHTCHANGE @ Height:   Height: 5\' 3"  (160 cm)  Ht Readings from Last 3 Encounters:  09/25/18 5\' 3"  (1.6 m)  09/25/18 5\' 3"  (  1.6 m)  09/07/18 5\' 3"  (1.6 m)    General appearance: alert, cooperative and appears stated age Head: Normocephalic, without obvious abnormality, atraumatic Neck: no adenopathy, supple, symmetrical, trachea midline and thyroid normal to inspection and palpation Lungs: clear to auscultation bilaterally Cardiovascular: regular rate and rhythm Breasts: normal appearance, no masses or tenderness, left nipple inverted (patient states since her breast biopsies) Abdomen: soft, non-tender; non distended,  no masses,  no organomegaly Extremities: extremities normal, atraumatic, no cyanosis or edema Skin: Skin color, texture, turgor normal. No rashes or lesions Lymph nodes: Cervical, supraclavicular, and axillary nodes normal. No abnormal inguinal nodes palpated Neurologic: Grossly normal   Pelvic: External genitalia:  no lesions, loss of architecture of her clitoris and labia minora, fused in the midline. The surrounding tissue looks healthy              Urethra:  normal appearing urethra with no masses, tenderness or lesions              Bartholins and Skenes: normal                 Vagina: normal appearing vagina with normal color and discharge, no lesions              Cervix: absent               Bimanual Exam:  Uterus:  uterus absent              Adnexa: no mass, fullness, tenderness               Rectovaginal: Confirms               Anus:  normal sphincter tone, no lesions  Chaperone was present for exam.  A:  Well Woman with normal exam  Mild osteopenia 12/18  P:   No pap needed  Mammogram and colonoscopy UTD  DEXA in the next few years  Discussed breast self exam  Discussed calcium and vit D intake  Labs with primary MD

## 2018-09-26 ENCOUNTER — Encounter: Payer: Self-pay | Admitting: Family Medicine

## 2018-09-26 ENCOUNTER — Encounter (INDEPENDENT_AMBULATORY_CARE_PROVIDER_SITE_OTHER): Payer: Self-pay | Admitting: Family Medicine

## 2018-09-27 ENCOUNTER — Telehealth: Payer: Self-pay | Admitting: *Deleted

## 2018-09-27 ENCOUNTER — Other Ambulatory Visit: Payer: Self-pay | Admitting: *Deleted

## 2018-09-27 DIAGNOSIS — M79672 Pain in left foot: Secondary | ICD-10-CM

## 2018-09-27 NOTE — Telephone Encounter (Signed)
Patient is ok to see Dr.Regal on 09/29/18 for left foot pain.

## 2018-09-29 ENCOUNTER — Encounter: Payer: Self-pay | Admitting: Podiatry

## 2018-09-29 ENCOUNTER — Ambulatory Visit (INDEPENDENT_AMBULATORY_CARE_PROVIDER_SITE_OTHER): Payer: No Typology Code available for payment source

## 2018-09-29 ENCOUNTER — Other Ambulatory Visit: Payer: Self-pay | Admitting: Podiatry

## 2018-09-29 ENCOUNTER — Ambulatory Visit: Payer: No Typology Code available for payment source | Admitting: Podiatry

## 2018-09-29 VITALS — Resp 16

## 2018-09-29 DIAGNOSIS — M722 Plantar fascial fibromatosis: Secondary | ICD-10-CM

## 2018-09-29 DIAGNOSIS — M779 Enthesopathy, unspecified: Secondary | ICD-10-CM

## 2018-09-29 DIAGNOSIS — M79672 Pain in left foot: Secondary | ICD-10-CM

## 2018-09-29 DIAGNOSIS — M76822 Posterior tibial tendinitis, left leg: Secondary | ICD-10-CM

## 2018-09-29 MED ORDER — TRIAMCINOLONE ACETONIDE 10 MG/ML IJ SUSP
10.0000 mg | Freq: Once | INTRAMUSCULAR | Status: AC
  Administered 2018-09-29: 10 mg

## 2018-09-29 MED ORDER — MELOXICAM 15 MG PO TABS: 15.0000 mg | ORAL_TABLET | Freq: Every day | ORAL | 2 refills | Status: DC

## 2018-09-29 MED FILL — MELOXICAM 15 MG TABLET: 15 | 30 days supply | Qty: 30 | Fill #0

## 2018-09-29 NOTE — Patient Instructions (Addendum)

## 2018-09-29 NOTE — Progress Notes (Signed)
   Subjective:    Patient ID: Cindy Carey, female    DOB: 24-May-1956, 62 y.o.   MRN: [id number]  HPI    Review of Systems  All other systems reviewed and are negative.      Objective:   Physical Exam        Assessment & Plan:

## 2018-09-30 NOTE — Progress Notes (Signed)
Subjective:   Patient ID: Cindy Carey, female   DOB: 62 y.o.   MRN: [id number]   HPI Patient presents stating she is been getting quite a bit of discomfort in the bottom of her left heel and the side of her left ankle.  Patient states it started after she was very active working in the yard and is become more of an irritation since then.  Patient does not smoke and likes to be active and has a high BMI of 47   Review of Systems  All other systems reviewed and are negative.       Objective:  Physical Exam Vitals signs and nursing note reviewed.  Constitutional:      Appearance: She is well-developed.  Pulmonary:     Effort: Pulmonary effort is normal.  Musculoskeletal: Normal range of motion.  Skin:    General: Skin is warm.  Neurological:     Mental Status: She is alert.     Neurovascular status intact muscle strength is adequate range of motion within normal limits with patient noted to have exquisite discomfort in the plantar proximal portion of the plantar fascial insertion with pain also noted to the posterior tibial tendon with no indication of tendon dysfunction or depression of the arch.  Patient was found to have good digital perfusion and is well oriented x3     Assessment:  Acute plantar fasciitis left with inflammation fluid in a more proximal direction and posterior tibial tendinitis which may be compensatory or its own problems     Plan:  H&P x-ray reviewed and today I injected the plantar fascia 3 mg Kenalog 5 mg Xylocaine applied fascial brace gave instructions for supportive shoes and reappoint in 1 week.  Placed on Mobic 15 mg daily  X-ray indicates small spur with no indications of stress fracture arthritis

## 2018-10-09 ENCOUNTER — Encounter (INDEPENDENT_AMBULATORY_CARE_PROVIDER_SITE_OTHER): Payer: Self-pay | Admitting: Family Medicine

## 2018-10-09 ENCOUNTER — Ambulatory Visit (INDEPENDENT_AMBULATORY_CARE_PROVIDER_SITE_OTHER): Payer: No Typology Code available for payment source | Admitting: Family Medicine

## 2018-10-09 VITALS — BP 114/77 | HR 66 | Temp 98.0°F | Ht 63.0 in | Wt 260.0 lb

## 2018-10-09 DIAGNOSIS — Z6841 Body Mass Index (BMI) 40.0 and over, adult: Secondary | ICD-10-CM

## 2018-10-09 DIAGNOSIS — E119 Type 2 diabetes mellitus without complications: Secondary | ICD-10-CM

## 2018-10-10 NOTE — Progress Notes (Signed)
Office: [phone]  /  Fax: [phone]   HPI:   Chief Complaint: OBESITY Cindy Carey is here to discuss her progress with her obesity treatment plan. She is on the  keep a food journal with 1500 calories and 100 protein /Pescatarian and is following her eating plan approximately 10 % of the time. She states she is exercising 40 minutes 3 times per week.Livana was surprised with weight loss. Her stress in decreased now and she is able to focus on her meal plan.  Her weight is 260 lb (117.9 kg) today and has had a weight loss of 2 pounds over a period of 2 weeks since her last visit. She has lost 2 lbs since starting treatment with [name].  Diabetes II Cianna has a diagnosis of diabetes type II. Quandra states BGs range between 90 and 100 and denies any hypoglycemic episodes. Last A1c was Hemoglobin A1C Latest Ref Rng & Units 07/13/2018 04/13/2018 12/07/2017  HGBA1C 4.8 - 5.6 % 7.0(H) 6.0(A) 6.2  Some recent data might be hidden    She has been working on intensive lifestyle modifications including diet, exercise, and weight loss to help control her blood glucose levels. She is currently on Trulicity and Metformin. Her PCP will check labs in February.   ASSESSMENT AND PLAN:  Type 2 diabetes mellitus without complication, without long-term current use of insulin (HCC)  Class 3 severe obesity with serious comorbidity and body mass index (BMI) of 45.0 to 49.9 in adult, unspecified obesity type ([place])  PLAN: Diabetes II Charlane has been given extensive diabetes education by myself today including ideal fasting and post-prandial blood glucose readings, individual ideal HgA1c goals  and hypoglycemia prevention. We discussed the importance of good blood sugar control to decrease the likelihood of diabetic complications such as nephropathy, neuropathy, limb loss, blindness, coronary artery disease, and death. We discussed the importance of intensive lifestyle modification including diet, exercise and weight loss as  the first line treatment for diabetes. Carmon agrees to continue her diabetes medications and will follow up at the agreed upon time.   Obesity Yashvi is currently in the action stage of change. As such, her goal is to continue with weight loss efforts She has agreed to keep a food journal with 1500 calories and 100 protein /Pescartarian.  Marycatherine has been instructed to continue at [employer] three times a week at 40 minutes a visit. We discussed the following Behavioral Modification Stratagies today: increasing lean protein intake, celebration eating strategies and planning for success.   I spent > than 50% of the 15 minute visit on counseling as documented in the note.  Samanvitha has agreed to follow up with our clinic in 2 weeks. She was informed of the importance of frequent follow up visits to maximize her success with intensive lifestyle modifications for her multiple health conditions.  ALLERGIES: Allergies  Allergen Reactions  . Bee Venom Anaphylaxis  . Shellfish Allergy Anaphylaxis  . Penicillins Hives    MEDICATIONS: Current Outpatient Medications on File Prior to Visit  Medication Sig Dispense Refill  . ACCU-CHEK FASTCLIX LANCETS MISC USE TO CHECK BLOOD SUGAR TWICE A DAY AS DIRECTED 102 each 3  . ACCU-CHEK GUIDE test strip USE TO CHECK BLOOD SUGAR TWICE A DAY AS DIRECTED 100 each 3  . albuterol (PROVENTIL HFA;VENTOLIN HFA) 108 (90 BASE) MCG/ACT inhaler Inhale 2 puffs into the lungs every 6 (six) hours as needed for wheezing. Reported on 03/31/2016    . aspirin EC 81 MG tablet Take 81  mg by mouth daily.    . Biotin 10000 MCG TABS Take by mouth.    . Calcium Carbonate-Vit D-Min (CALCIUM 600+D PLUS MINERALS) 600-400 MG-UNIT TABS Take 1 tablet by mouth 2 (two) times daily.    . cyclobenzaprine (FLEXERIL) 10 MG tablet Take 1 tablet (10 mg total) by mouth 3 (three) times daily as needed for muscle spasms. 30 tablet 0  . Dulaglutide (TRULICITY) 1.5 OJ/5.0KX SOPN INJECT 1.5 MG INTO THE SKIN  EVERY 7 DAYS. 6 mL 2  . EPINEPHrine 0.3 mg/0.3 mL IJ SOAJ injection Inject 0.3 mLs (0.3 mg total) into the muscle once as needed. Reported on 04/30/2016 1 Device 0  . lisinopril (PRINIVIL,ZESTRIL) 10 MG tablet Take 1 tablet (10 mg total) by mouth daily. 90 tablet 0  . Magnesium 250 MG TABS Take 250 mg by mouth daily.    . meloxicam (MOBIC) 15 MG tablet Take 1 tablet (15 mg total) by mouth daily. 30 tablet 2  . metFORMIN (GLUCOPHAGE-XR) 500 MG 24 hr tablet Take 2 tablets (1,000 mg total) by mouth 2 (two) times daily. 360 tablet 0  . METRONIDAZOLE, TOPICAL, 0.75 % LOTN Apply 1 application topically 2 (two) times daily. 59 mL 12  . Multiple Vitamins-Minerals (MULTIVITAMIN PO) Take 1 tablet by mouth daily. Reported on 03/31/2016    . promethazine (PHENERGAN) 25 MG tablet Take 1 tablet (25 mg total) by mouth every 8 (eight) hours as needed for nausea. 30 tablet 0  . simvastatin (ZOCOR) 40 MG tablet Take 1 tablet (40 mg total) by mouth every evening. 90 tablet 0  . tamoxifen (NOLVADEX) 20 MG tablet Take 1 tablet (20 mg total) by mouth daily. 90 tablet 3   No current facility-administered medications on file prior to visit.     PAST MEDICAL HISTORY: Past Medical History:  Diagnosis Date  . Anemia   . Asthma   . Blood transfusion without reported diagnosis 1983  . Diabetes mellitus without complication ([place]) 3818   T2DM  . Dyspareunia   . Exercise-induced asthma    worse in winter  . H/O bone density study 2013  . H/O cold sores   . H/O colonoscopy 2009  . History of endometriosis   . Hyperlipidemia   . Hypertension   . Injury of tendon of rotator cuff 2015   gym injury (right)  . Knee pain 08/2015   L>R  . Migraine with typical aura   . Mild CAD 04/04/2018   Mild disease on coronary CT-A 01/2018.  . Morbid obesity ([place]) 09/04/12   BMI 49.4 kg/m^2   . Osteoarthritis   . Trigger finger of left thumb 08/2018   injected by Dr. Fredna Dow 09/18/18    PAST SURGICAL HISTORY: Past Surgical  History:  Procedure Laterality Date  . BREAST BIOPSY  2010  . BREAST LUMPECTOMY  2010  . BREAST LUMPECTOMY WITH RADIOACTIVE SEED LOCALIZATION Left 07/23/2015   Procedure: LEFT BREAST SEED GUIDED EXCISION;  Surgeon: Rolm Bookbinder, MD;  Location: [facility];  Service: General;  Laterality: Left;  . BREATH TEK H PYLORI  08/28/2012   Procedure: BREATH TEK H PYLORI;  Surgeon: Pedro Earls, MD;  Location: [facility];  Service: General;  Laterality: N/A;  . CHOLECYSTECTOMY  2007  . EXPLORATORY LAPAROTOMY  1983   endometriosis  . GANGLION CYST EXCISION Left 1999   Foot  . TOTAL ABDOMINAL HYSTERECTOMY W/ BILATERAL SALPINGOOPHORECTOMY  1984   endometriosis (and appendectomy)    SOCIAL HISTORY: Social History  Tobacco Use  . Smoking status: Never Smoker  . Smokeless tobacco: Never Used  Substance Use Topics  . Alcohol use: No  . Drug use: No    FAMILY HISTORY: Family History  Problem Relation Age of Onset  . Diabetes Father   . Heart attack Father 75  . Heart disease Father   . Hypertension Father   . Peripheral Artery Disease Father        stents at 31  . Diabetes Paternal Grandfather   . Heart disease Paternal Grandfather   . Osteoporosis Maternal Grandmother   . Heart failure Maternal Grandmother   . Thyroid disease Mother        hypothyroidism  . Heart disease Mother 75       CABG at 43  . Hypertension Mother   . Diverticulitis Mother 74       perforation, required surgery  . Pneumonia Sister   . Cancer Maternal Grandfather        lung  . Dementia Paternal Grandmother   . Cancer Paternal Grandmother        melanoma on her foot (not related to death)  . Breast cancer Paternal Aunt        88's  . Cancer Maternal Aunt        lung  . Cancer Maternal Uncle        lung  . Breast cancer Cousin        mets to lung  . Congestive Heart Failure Maternal Uncle   . Breast cancer Cousin     ROS: Review of Systems  Constitutional: Positive for  weight loss.  Endo/Heme/Allergies:       Negative for polyphagia and hypoglycemia.    PHYSICAL EXAM: Blood pressure 114/77, pulse 66, temperature 98 F (36.7 C), temperature source Oral, height 5\' 3"  (1.6 m), weight 260 lb (117.9 kg), last menstrual period 10/18/1982, SpO2 95 %. Body mass index is 46.06 kg/m. Physical Exam Vitals signs reviewed.  HENT:     Head: Normocephalic.  Eyes:     Extraocular Movements: Extraocular movements intact.  Neck:     Musculoskeletal: Normal range of motion.  Cardiovascular:     Rate and Rhythm: Normal rate.  Pulmonary:     Effort: Pulmonary effort is normal.  Musculoskeletal: Normal range of motion.  Skin:    General: Skin is warm.  Neurological:     Mental Status: She is alert and oriented to person, place, and time.  Psychiatric:        Behavior: Behavior normal.     RECENT LABS AND TESTS: BMET    Component Value Date/Time   NA 139 07/13/2018 1031   NA 139 11/21/2015 0826   K 4.5 07/13/2018 1031   K 4.7 11/21/2015 0826   CL 102 07/13/2018 1031   CO2 18 (L) 07/13/2018 1031   CO2 24 11/21/2015 0826   GLUCOSE 113 (H) 07/13/2018 1031   GLUCOSE 96 09/30/2016 0811   GLUCOSE 125 11/21/2015 0826   BUN 12 07/13/2018 1031   BUN 12.5 11/21/2015 0826   CREATININE 0.83 07/13/2018 1031   CREATININE 0.93 09/30/2016 0811   CREATININE 1.0 11/21/2015 0826   CALCIUM 9.2 07/13/2018 1031   CALCIUM 9.6 11/21/2015 0826   GFRNONAA 77 07/13/2018 1031   GFRAA 89 07/13/2018 1031   Lab Results  Component Value Date   HGBA1C 7.0 (H) 07/13/2018   HGBA1C 6.0 (A) 04/13/2018   HGBA1C 6.2 12/07/2017   HGBA1C 6.1 01/05/2017   HGBA1C 6.0  09/30/2016   Lab Results  Component Value Date   INSULIN 59.1 (H) 07/13/2018   CBC    Component Value Date/Time   WBC 8.7 12/07/2017 1619   WBC 7.8 09/30/2016 0811   RBC 4.57 12/07/2017 1619   RBC 4.92 09/30/2016 0811   HGB 14.1 12/07/2017 1619   HGB 15.1 11/21/2015 0826   HCT 42.5 12/07/2017 1619   HCT  46.3 11/21/2015 0826   PLT 121 (L) 12/07/2017 1619   MCV 93 12/07/2017 1619   MCV 95.1 11/21/2015 0826   MCH 30.9 12/07/2017 1619   MCH 32.3 09/30/2016 0811   MCHC 33.2 12/07/2017 1619   MCHC 33.1 09/30/2016 0811   RDW 14.3 12/07/2017 1619   RDW 15.1 (H) 11/21/2015 0826   LYMPHSABS 3.5 (H) 12/07/2017 1619   LYMPHSABS 2.6 11/21/2015 0826   MONOABS 546 09/30/2016 0811   MONOABS 0.7 11/21/2015 0826   EOSABS 0.2 12/07/2017 1619   BASOSABS 0.0 12/07/2017 1619   BASOSABS 0.0 11/21/2015 0826   Iron/TIBC/Ferritin/ %Sat    Component Value Date/Time   IRON 84 12/07/2017 1619   Lipid Panel     Component Value Date/Time   CHOL 133 12/07/2017 1619   TRIG 69 12/07/2017 1619   HDL 71 12/07/2017 1619   CHOLHDL 1.9 12/07/2017 1619   CHOLHDL 2.3 09/30/2016 0811   VLDL 26 09/30/2016 0811   LDLCALC 48 12/07/2017 1619   Hepatic Function Panel     Component Value Date/Time   PROT 6.5 07/13/2018 1031   PROT 7.2 11/21/2015 0826   ALBUMIN 4.0 07/13/2018 1031   ALBUMIN 3.8 11/21/2015 0826   AST 51 (H) 07/13/2018 1031   AST 24 11/21/2015 0826   ALT 24 07/13/2018 1031   ALT 24 11/21/2015 0826   ALKPHOS 93 07/13/2018 1031   ALKPHOS 75 11/21/2015 0826   BILITOT 0.4 07/13/2018 1031   BILITOT 0.58 11/21/2015 0826      Component Value Date/Time   TSH 2.200 12/07/2017 1619   TSH 1.79 03/31/2016 0859   TSH 2.711 09/06/2012 0741      OBESITY BEHAVIORAL INTERVENTION VISIT  Today's visit was # 7   Starting weight: 262 lb Starting date: 07/13/18 Today's weight : Weight: 260 lb (117.9 kg)  Today's date: 10/09/18 Total lbs lost to date: 2 At least 15 minutes were spent on discussing the following behavioral intervention visit.   ASK: We discussed the diagnosis of obesity with Joelyn Oms today and Paisely agreed to give [name] permission to discuss obesity behavioral modification therapy today.  ASSESS: Roshanna has the diagnosis of obesity and her BMI today is 36.2 Emmery is in the  action stage of change   ADVISE: Nikira was educated on the multiple health risks of obesity as well as the benefit of weight loss to improve her health. She was advised of the need for long term treatment and the importance of lifestyle modifications to improve her current health and to decrease her risk of future health problems.  AGREE: Multiple dietary modification options and treatment options were discussed and  Janelle agreed to follow the recommendations documented in the above note.  ARRANGE: Brynlynn was educated on the importance of frequent visits to treat obesity as outlined per CMS and USPSTF guidelines and agreed to schedule her next follow up appointment today.  I, April Moore, am acting as Location manager for [employer], [employer].  I have reviewed the above documentation for accuracy and completeness, and I agree with the above.  - Maurissa Ambrose  Kaytlyn Din, FNP-C.

## 2018-10-16 ENCOUNTER — Encounter (INDEPENDENT_AMBULATORY_CARE_PROVIDER_SITE_OTHER): Payer: Self-pay | Admitting: Family Medicine

## 2018-10-16 ENCOUNTER — Other Ambulatory Visit: Payer: Self-pay | Admitting: Family Medicine

## 2018-10-16 DIAGNOSIS — I10 Essential (primary) hypertension: Secondary | ICD-10-CM

## 2018-10-16 MED ORDER — LISINOPRIL 10 MG PO TABS: 10.0000 mg | ORAL_TABLET | Freq: Every day | ORAL | 0 refills | Status: DC

## 2018-10-16 MED FILL — LISINOPRIL 10 MG TABS: 10 | 90 days supply | Qty: 90 | Fill #0

## 2018-10-16 MED FILL — TAMOXIFEN CITRATE 20 MG TAB: 20 | 90 days supply | Qty: 90 | Fill #2

## 2018-10-20 ENCOUNTER — Ambulatory Visit: Payer: No Typology Code available for payment source | Admitting: Podiatry

## 2018-10-22 ENCOUNTER — Encounter: Payer: Self-pay | Admitting: Family Medicine

## 2018-10-23 ENCOUNTER — Telehealth: Payer: Self-pay | Admitting: *Deleted

## 2018-10-23 NOTE — Telephone Encounter (Signed)
Patient has an appt witrh Dr.Hollander on Tues 10/24/18 and this was approved by her PCP.

## 2018-10-24 LAB — HM DIABETES EYE EXAM

## 2018-10-26 ENCOUNTER — Encounter: Payer: Self-pay | Admitting: *Deleted

## 2018-10-27 ENCOUNTER — Ambulatory Visit: Payer: No Typology Code available for payment source | Admitting: Podiatry

## 2018-10-27 ENCOUNTER — Encounter: Payer: Self-pay | Admitting: Podiatry

## 2018-10-27 DIAGNOSIS — M722 Plantar fascial fibromatosis: Secondary | ICD-10-CM | POA: Diagnosis not present

## 2018-10-27 NOTE — Progress Notes (Signed)
Subjective:   Patient ID: Cindy Carey, female   DOB: 63 y.o.   MRN: [id number]   HPI Patient presents stating that her heel is feeling some better with discomfort still noted but quite a bit better than previously   ROS      Objective:  Physical Exam  Neurovascular status found to be intact muscle strength is adequate range of motion within normal limits with discomfort in the heel that is improved quite nicely with mild discomfort upon palpation with patient walking with a good heel toe gait pattern with obesity being complicating factor     Assessment:  Fasciitis improved with mild discomfort noted but significant improvement     Plan:  H&P condition reviewed and recommended physical therapy anti-inflammatories and supportive shoe and patient is discharged unless any symptoms were to occur occur

## 2018-10-30 ENCOUNTER — Ambulatory Visit (INDEPENDENT_AMBULATORY_CARE_PROVIDER_SITE_OTHER): Payer: No Typology Code available for payment source | Admitting: Family Medicine

## 2018-10-30 MED FILL — MELOXICAM 15 MG TABLET: 15 | 30 days supply | Qty: 30 | Fill #1

## 2018-11-06 ENCOUNTER — Ambulatory Visit (INDEPENDENT_AMBULATORY_CARE_PROVIDER_SITE_OTHER): Payer: No Typology Code available for payment source | Admitting: Family Medicine

## 2018-11-06 ENCOUNTER — Encounter (INDEPENDENT_AMBULATORY_CARE_PROVIDER_SITE_OTHER): Payer: Self-pay | Admitting: Family Medicine

## 2018-11-06 VITALS — BP 118/77 | HR 75 | Temp 97.5°F | Ht 63.0 in | Wt 261.0 lb

## 2018-11-06 DIAGNOSIS — Z6841 Body Mass Index (BMI) 40.0 and over, adult: Secondary | ICD-10-CM

## 2018-11-06 DIAGNOSIS — E119 Type 2 diabetes mellitus without complications: Secondary | ICD-10-CM

## 2018-11-07 NOTE — Progress Notes (Signed)
Office: [phone]  /  Fax: [phone]   HPI:   Chief Complaint: OBESITY Cindy Carey is here to discuss her progress with her obesity treatment plan. She is on th  keep a food journal with 1500 calories and 100 grams of protein daily and follow the Pescatarian eating plan and is following her eating plan approximately 50% of the time. She states she is exercising 0 minutes 0 times per week. Cindy Carey has been off track. Her last visit was 10/09/18. She has been ill with an upper respiratory infection and has not been cooking recently. She feels that her long work days (about 14 hours) are keeping her from eating appropriately because she does not meal prep.  Her weight is 261 lb (118.4 kg) today and has gained 1 pound since her last visit. She has lost 1 lb since starting treatment with [name].  Diabetes II Cindy Carey has a diagnosis of diabetes type II. Cindy Carey's last A1c was 7.0 on 07/13/18. She is moderately well controlled with Trulicity and metformin. She is currently on a statin. She denies hypoglycemia. She has been working on intensive lifestyle modifications including diet, exercise, and weight loss to help control her blood glucose levels.  ALLERGIES: Allergies  Allergen Reactions  . Bee Venom Anaphylaxis  . Shellfish Allergy Anaphylaxis  . Penicillins Hives    MEDICATIONS: Current Outpatient Medications on File Prior to Visit  Medication Sig Dispense Refill  . ACCU-CHEK FASTCLIX LANCETS MISC USE TO CHECK BLOOD SUGAR TWICE A DAY AS DIRECTED 102 each 3  . ACCU-CHEK GUIDE test strip USE TO CHECK BLOOD SUGAR TWICE A DAY AS DIRECTED 100 each 3  . albuterol (PROVENTIL HFA;VENTOLIN HFA) 108 (90 BASE) MCG/ACT inhaler Inhale 2 puffs into the lungs every 6 (six) hours as needed for wheezing. Reported on 03/31/2016    . aspirin EC 81 MG tablet Take 81 mg by mouth daily.    . Biotin 10000 MCG TABS Take by mouth.    . Calcium Carbonate-Vit D-Min (CALCIUM 600+D PLUS MINERALS) 600-400 MG-UNIT TABS Take 1  tablet by mouth 2 (two) times daily.    . cyclobenzaprine (FLEXERIL) 10 MG tablet Take 1 tablet (10 mg total) by mouth 3 (three) times daily as needed for muscle spasms. 30 tablet 0  . Dulaglutide (TRULICITY) 1.5 EQ/6.8TM SOPN INJECT 1.5 MG INTO THE SKIN EVERY 7 DAYS. 6 mL 2  . EPINEPHrine 0.3 mg/0.3 mL IJ SOAJ injection Inject 0.3 mLs (0.3 mg total) into the muscle once as needed. Reported on 04/30/2016 1 Device 0  . lisinopril (PRINIVIL,ZESTRIL) 10 MG tablet Take 1 tablet (10 mg total) by mouth daily. 90 tablet 0  . Magnesium 250 MG TABS Take 250 mg by mouth daily.    . meloxicam (MOBIC) 15 MG tablet Take 1 tablet (15 mg total) by mouth daily. 30 tablet 2  . metFORMIN (GLUCOPHAGE-XR) 500 MG 24 hr tablet Take 2 tablets (1,000 mg total) by mouth 2 (two) times daily. 360 tablet 0  . METRONIDAZOLE, TOPICAL, 0.75 % LOTN Apply 1 application topically 2 (two) times daily. 59 mL 12  . Multiple Vitamins-Minerals (MULTIVITAMIN PO) Take 1 tablet by mouth daily. Reported on 03/31/2016    . promethazine (PHENERGAN) 25 MG tablet Take 1 tablet (25 mg total) by mouth every 8 (eight) hours as needed for nausea. 30 tablet 0  . simvastatin (ZOCOR) 40 MG tablet Take 1 tablet (40 mg total) by mouth every evening. 90 tablet 0  . tamoxifen (NOLVADEX) 20 MG tablet Take 1  tablet (20 mg total) by mouth daily. 90 tablet 3   No current facility-administered medications on file prior to visit.     PAST MEDICAL HISTORY: Past Medical History:  Diagnosis Date  . Anemia   . Asthma   . Blood transfusion without reported diagnosis 1983  . Diabetes mellitus without complication ([place]) 2637   T2DM  . Dyspareunia   . Exercise-induced asthma    worse in winter  . H/O bone density study 2013  . H/O cold sores   . H/O colonoscopy 2009  . History of endometriosis   . Hyperlipidemia   . Hypertension   . Injury of tendon of rotator cuff 2015   gym injury (right)  . Knee pain 08/2015   L>R  . Migraine with typical aura   .  Mild CAD 04/04/2018   Mild disease on coronary CT-A 01/2018.  . Morbid obesity ([place]) 09/04/12   BMI 49.4 kg/m^2   . Osteoarthritis   . Trigger finger of left thumb 08/2018   injected by Dr. Fredna Dow 09/18/18    PAST SURGICAL HISTORY: Past Surgical History:  Procedure Laterality Date  . BREAST BIOPSY  2010  . BREAST LUMPECTOMY  2010  . BREAST LUMPECTOMY WITH RADIOACTIVE SEED LOCALIZATION Left 07/23/2015   Procedure: LEFT BREAST SEED GUIDED EXCISION;  Surgeon: Rolm Bookbinder, MD;  Location: [facility];  Service: General;  Laterality: Left;  . BREATH TEK H PYLORI  08/28/2012   Procedure: BREATH TEK H PYLORI;  Surgeon: Pedro Earls, MD;  Location: [facility];  Service: General;  Laterality: N/A;  . CHOLECYSTECTOMY  2007  . EXPLORATORY LAPAROTOMY  1983   endometriosis  . GANGLION CYST EXCISION Left 1999   Foot  . TOTAL ABDOMINAL HYSTERECTOMY W/ BILATERAL SALPINGOOPHORECTOMY  1984   endometriosis (and appendectomy)    SOCIAL HISTORY: Social History   Tobacco Use  . Smoking status: Never Smoker  . Smokeless tobacco: Never Used  Substance Use Topics  . Alcohol use: No  . Drug use: No    FAMILY HISTORY: Family History  Problem Relation Age of Onset  . Diabetes Father   . Heart attack Father 47  . Heart disease Father   . Hypertension Father   . Peripheral Artery Disease Father        stents at 62  . Diabetes Paternal Grandfather   . Heart disease Paternal Grandfather   . Osteoporosis Maternal Grandmother   . Heart failure Maternal Grandmother   . Thyroid disease Mother        hypothyroidism  . Heart disease Mother 10       CABG at 31  . Hypertension Mother   . Diverticulitis Mother 33       perforation, required surgery  . Pneumonia Sister   . Cancer Maternal Grandfather        lung  . Dementia Paternal Grandmother   . Cancer Paternal Grandmother        melanoma on her foot (not related to death)  . Breast cancer Paternal Aunt        64's  .  Cancer Maternal Aunt        lung  . Cancer Maternal Uncle        lung  . Breast cancer Cousin        mets to lung  . Congestive Heart Failure Maternal Uncle   . Breast cancer Cousin     ROS: Review of Systems  Constitutional: Negative for weight loss.  Endo/Heme/Allergies:       Negative hypoglycemia    PHYSICAL EXAM: Blood pressure 118/77, pulse 75, temperature (!) 97.5 F (36.4 C), temperature source Oral, height 5\' 3"  (1.6 m), weight 261 lb (118.4 kg), last menstrual period 10/18/1982, SpO2 98 %. Body mass index is 46.23 kg/m. Physical Exam Vitals signs reviewed.  Constitutional:      Appearance: Normal appearance. She is obese.  Cardiovascular:     Rate and Rhythm: Normal rate.     Pulses: Normal pulses.  Pulmonary:     Effort: Pulmonary effort is normal.     Breath sounds: Normal breath sounds.  Musculoskeletal: Normal range of motion.  Skin:    General: Skin is warm and dry.  Neurological:     Mental Status: She is alert and oriented to person, place, and time.  Psychiatric:        Mood and Affect: Mood normal.        Behavior: Behavior normal.     RECENT LABS AND TESTS: BMET    Component Value Date/Time   NA 139 07/13/2018 1031   NA 139 11/21/2015 0826   K 4.5 07/13/2018 1031   K 4.7 11/21/2015 0826   CL 102 07/13/2018 1031   CO2 18 (L) 07/13/2018 1031   CO2 24 11/21/2015 0826   GLUCOSE 113 (H) 07/13/2018 1031   GLUCOSE 96 09/30/2016 0811   GLUCOSE 125 11/21/2015 0826   BUN 12 07/13/2018 1031   BUN 12.5 11/21/2015 0826   CREATININE 0.83 07/13/2018 1031   CREATININE 0.93 09/30/2016 0811   CREATININE 1.0 11/21/2015 0826   CALCIUM 9.2 07/13/2018 1031   CALCIUM 9.6 11/21/2015 0826   GFRNONAA 77 07/13/2018 1031   GFRAA 89 07/13/2018 1031   Lab Results  Component Value Date   HGBA1C 7.0 (H) 07/13/2018   HGBA1C 6.0 (A) 04/13/2018   HGBA1C 6.2 12/07/2017   HGBA1C 6.1 01/05/2017   HGBA1C 6.0 09/30/2016   Lab Results  Component Value Date    INSULIN 59.1 (H) 07/13/2018   CBC    Component Value Date/Time   WBC 8.7 12/07/2017 1619   WBC 7.8 09/30/2016 0811   RBC 4.57 12/07/2017 1619   RBC 4.92 09/30/2016 0811   HGB 14.1 12/07/2017 1619   HGB 15.1 11/21/2015 0826   HCT 42.5 12/07/2017 1619   HCT 46.3 11/21/2015 0826   PLT 121 (L) 12/07/2017 1619   MCV 93 12/07/2017 1619   MCV 95.1 11/21/2015 0826   MCH 30.9 12/07/2017 1619   MCH 32.3 09/30/2016 0811   MCHC 33.2 12/07/2017 1619   MCHC 33.1 09/30/2016 0811   RDW 14.3 12/07/2017 1619   RDW 15.1 (H) 11/21/2015 0826   LYMPHSABS 3.5 (H) 12/07/2017 1619   LYMPHSABS 2.6 11/21/2015 0826   MONOABS 546 09/30/2016 0811   MONOABS 0.7 11/21/2015 0826   EOSABS 0.2 12/07/2017 1619   BASOSABS 0.0 12/07/2017 1619   BASOSABS 0.0 11/21/2015 0826   Iron/TIBC/Ferritin/ %Sat    Component Value Date/Time   IRON 84 12/07/2017 1619   Lipid Panel     Component Value Date/Time   CHOL 133 12/07/2017 1619   TRIG 69 12/07/2017 1619   HDL 71 12/07/2017 1619   CHOLHDL 1.9 12/07/2017 1619   CHOLHDL 2.3 09/30/2016 0811   VLDL 26 09/30/2016 0811   LDLCALC 48 12/07/2017 1619   Hepatic Function Panel     Component Value Date/Time   PROT 6.5 07/13/2018 1031   PROT 7.2 11/21/2015 0826   ALBUMIN 4.0  07/13/2018 1031   ALBUMIN 3.8 11/21/2015 0826   AST 51 (H) 07/13/2018 1031   AST 24 11/21/2015 0826   ALT 24 07/13/2018 1031   ALT 24 11/21/2015 0826   ALKPHOS 93 07/13/2018 1031   ALKPHOS 75 11/21/2015 0826   BILITOT 0.4 07/13/2018 1031   BILITOT 0.58 11/21/2015 0826      Component Value Date/Time   TSH 2.200 12/07/2017 1619   TSH 1.79 03/31/2016 0859   TSH 2.711 09/06/2012 0741    ASSESSMENT AND PLAN: Type 2 diabetes mellitus without complication, without long-term current use of insulin (HCC)  Class 3 severe obesity with serious comorbidity and body mass index (BMI) of 45.0 to 49.9 in adult, unspecified obesity type ([place])  PLAN:  Diabetes II Shaeley has been given extensive  diabetes education by myself today including ideal fasting and post-prandial blood glucose readings, individual ideal Hgb A1c goals and hypoglycemia prevention. We discussed the importance of good blood sugar control to decrease the likelihood of diabetic complications such as nephropathy, neuropathy, limb loss, blindness, coronary artery disease, and death. We discussed the importance of intensive lifestyle modification including diet, exercise and weight loss as the first line treatment for diabetes. Leyan agrees to continue her diabetes medications and her primary care physician will recheck A1c in February. Skylie agrees to follow up with our clinic in 2 weeks.  I spent > than 50% of the 15 minute visit on counseling as documented in the note.  Obesity Lynesha is currently in the action stage of change. As such, her goal is to continue with weight loss efforts She has agreed to follow the Pescatarian eating plan Megahn has not been prescribed exercise at this time. We discussed the following Behavioral Modification Strategies today: increasing lean protein intake and work on meal planning and easy cooking plans   Tiwanda has agreed to follow up with our clinic in 2 weeks. She was informed of the importance of frequent follow up visits to maximize her success with intensive lifestyle modifications for her multiple health conditions.   OBESITY BEHAVIORAL INTERVENTION VISIT  Today's visit was # 8  Starting weight: 262 lbs Starting date: 07/13/18 Today's weight : 261 lbs  Today's date: 11/06/2018 Total lbs lost to date: 1    ASK: We discussed the diagnosis of obesity with Joelyn Oms today and Arantza agreed to give [name] permission to discuss obesity behavioral modification therapy today.  ASSESS: Aviona has the diagnosis of obesity and her BMI today is 46.25 Hajar is in the action stage of change   ADVISE: Obera was educated on the multiple health risks of obesity as well as the benefit of weight  loss to improve her health. She was advised of the need for long term treatment and the importance of lifestyle modifications to improve her current health and to decrease her risk of future health problems.  AGREE: Multiple dietary modification options and treatment options were discussed and  Katessa agreed to follow the recommendations documented in the above note.  ARRANGE: Banesa was educated on the importance of frequent visits to treat obesity as outlined per CMS and USPSTF guidelines and agreed to schedule her next follow up appointment today.  Wilhemena Durie, am acting as Location manager for [employer], FNP-C.  I have reviewed the above documentation for accuracy and completeness, and I agree with the above.  - Tiajah Oyster, FNP-C.

## 2018-11-08 ENCOUNTER — Encounter (INDEPENDENT_AMBULATORY_CARE_PROVIDER_SITE_OTHER): Payer: Self-pay | Admitting: Family Medicine

## 2018-11-22 MED FILL — TRULICITY 1.5 MG/0.5 ML PEN: 1.5 | 84 days supply | Qty: 6 | Fill #1

## 2018-11-23 ENCOUNTER — Encounter (INDEPENDENT_AMBULATORY_CARE_PROVIDER_SITE_OTHER): Payer: Self-pay | Admitting: Family Medicine

## 2018-11-24 ENCOUNTER — Encounter (INDEPENDENT_AMBULATORY_CARE_PROVIDER_SITE_OTHER): Payer: Self-pay | Admitting: Family Medicine

## 2018-11-27 ENCOUNTER — Encounter (INDEPENDENT_AMBULATORY_CARE_PROVIDER_SITE_OTHER): Payer: Self-pay

## 2018-11-27 ENCOUNTER — Ambulatory Visit (INDEPENDENT_AMBULATORY_CARE_PROVIDER_SITE_OTHER): Payer: No Typology Code available for payment source | Admitting: Family Medicine

## 2018-11-29 ENCOUNTER — Ambulatory Visit (INDEPENDENT_AMBULATORY_CARE_PROVIDER_SITE_OTHER): Payer: No Typology Code available for payment source | Admitting: Family Medicine

## 2018-11-29 ENCOUNTER — Encounter (INDEPENDENT_AMBULATORY_CARE_PROVIDER_SITE_OTHER): Payer: Self-pay | Admitting: Family Medicine

## 2018-11-29 VITALS — BP 109/71 | HR 79 | Temp 97.5°F | Ht 63.0 in | Wt 261.0 lb

## 2018-11-29 DIAGNOSIS — E119 Type 2 diabetes mellitus without complications: Secondary | ICD-10-CM

## 2018-11-29 DIAGNOSIS — Z6841 Body Mass Index (BMI) 40.0 and over, adult: Secondary | ICD-10-CM

## 2018-11-29 DIAGNOSIS — E66813 Obesity, class 3: Secondary | ICD-10-CM

## 2018-11-29 NOTE — Progress Notes (Signed)
Office: [phone]  /  Fax: [phone]   HPI:   Chief Complaint: OBESITY Cindy Carey is here to discuss her progress with her obesity treatment plan. She is on the  [place] and is following her eating plan approximately 90 % of the time. She states she is walking for 30 minutes 3 times a week. Cindy Carey feels she is getting back on track. She started a new schedule at work this week which will hopefully decrease work hours. She will try to meal prep more. She doesn't always eat all of the food on her plan currently. Her weight is 261 lb (118.4 kg) today and has had a weight loss of 0 lbs pounds over a period of 3 weeks since her last visit. She has lost 1 lbs since starting treatment with [name].  Diabetes II Cindy Carey has a diagnosis of diabetes type II. Cindy Carey states BGs range between 80 and 90 and random reading of 140 She does not report any hypoglycemic episodes. She denies polyphagia. Last A1c was Hemoglobin A1C Latest Ref Rng & Units 07/13/2018 04/13/2018 12/07/2017  HGBA1C 4.8 - 5.6 % 7.0(H) 6.0(A) 6.2  Some recent data might be hidden    She has been working on intensive lifestyle modifications including diet, exercise, and weight loss to help control her blood glucose levels.  ASSESSMENT AND PLAN:  Type 2 diabetes mellitus without complication, without long-term current use of insulin (HCC)  Class 3 severe obesity with serious comorbidity and body mass index (BMI) of 45.0 to 49.9 in adult, unspecified obesity type ([place])  PLAN:  Diabetes II Cindy Carey has been given extensive diabetes education by myself today including ideal fasting and post-prandial blood glucose readings, individual ideal Hgb A1c goals  and hypoglycemia prevention. We discussed the importance of good blood sugar control to decrease the likelihood of diabetic complications such as nephropathy, neuropathy, limb loss, blindness, coronary artery disease, and death. We discussed the importance of intensive lifestyle modification  including diet, exercise and weight loss as the first line treatment for diabetes. Cindy Carey agrees to continue taking Metformin and Trulicity and will follow up with our clinic in 3 weeks. PCP will check A1c this month.  I spent > than 50% of the 15 minute visit on counseling as documented in the note.  Obesity Cindy Carey is currently in the action stage of change. As such, her goal is to continue with weight loss efforts She has agreed to [place] has been instructed to continue walking for 30 minutes 3 times a week. We discussed the following Behavioral Modification Strategies today: increasing lean protein intake and work on meal planning and easy cooking plans and planning for success Cindy Carey was instructed to eat all food on plan.  Cindy Carey has agreed to follow up with our clinic in 3 weeks. She was informed of the importance of frequent follow up visits to maximize her success with intensive lifestyle modifications for her multiple health conditions.  ALLERGIES: Allergies  Allergen Reactions  . Bee Venom Anaphylaxis  . Shellfish Allergy Anaphylaxis  . Penicillins Hives    MEDICATIONS: Current Outpatient Medications on File Prior to Visit  Medication Sig Dispense Refill  . ACCU-CHEK FASTCLIX LANCETS MISC USE TO CHECK BLOOD SUGAR TWICE A DAY AS DIRECTED 102 each 3  . ACCU-CHEK GUIDE test strip USE TO CHECK BLOOD SUGAR TWICE A DAY AS DIRECTED 100 each 3  . albuterol (PROVENTIL HFA;VENTOLIN HFA) 108 (90 BASE) MCG/ACT inhaler Inhale 2 puffs into the lungs every 6 (six) hours as  needed for wheezing. Reported on 03/31/2016    . aspirin EC 81 MG tablet Take 81 mg by mouth daily.    . Biotin 10000 MCG TABS Take by mouth.    . Calcium Carbonate-Vit D-Min (CALCIUM 600+D PLUS MINERALS) 600-400 MG-UNIT TABS Take 1 tablet by mouth 2 (two) times daily.    . cyclobenzaprine (FLEXERIL) 10 MG tablet Take 1 tablet (10 mg total) by mouth 3 (three) times daily as needed for muscle spasms. 30 tablet 0  .  Dulaglutide (TRULICITY) 1.5 FI/4.3PI SOPN INJECT 1.5 MG INTO THE SKIN EVERY 7 DAYS. 6 mL 2  . EPINEPHrine 0.3 mg/0.3 mL IJ SOAJ injection Inject 0.3 mLs (0.3 mg total) into the muscle once as needed. Reported on 04/30/2016 1 Device 0  . lisinopril (PRINIVIL,ZESTRIL) 10 MG tablet Take 1 tablet (10 mg total) by mouth daily. 90 tablet 0  . Magnesium 250 MG TABS Take 250 mg by mouth daily.    . meloxicam (MOBIC) 15 MG tablet Take 1 tablet (15 mg total) by mouth daily. 30 tablet 2  . metFORMIN (GLUCOPHAGE-XR) 500 MG 24 hr tablet Take 2 tablets (1,000 mg total) by mouth 2 (two) times daily. 360 tablet 0  . METRONIDAZOLE, TOPICAL, 0.75 % LOTN Apply 1 application topically 2 (two) times daily. 59 mL 12  . Multiple Vitamins-Minerals (MULTIVITAMIN PO) Take 1 tablet by mouth daily. Reported on 03/31/2016    . promethazine (PHENERGAN) 25 MG tablet Take 1 tablet (25 mg total) by mouth every 8 (eight) hours as needed for nausea. 30 tablet 0  . simvastatin (ZOCOR) 40 MG tablet Take 1 tablet (40 mg total) by mouth every evening. 90 tablet 0  . tamoxifen (NOLVADEX) 20 MG tablet Take 1 tablet (20 mg total) by mouth daily. 90 tablet 3   No current facility-administered medications on file prior to visit.     PAST MEDICAL HISTORY: Past Medical History:  Diagnosis Date  . Anemia   . Asthma   . Blood transfusion without reported diagnosis 1983  . Diabetes mellitus without complication ([place]) 9518   T2DM  . Dyspareunia   . Exercise-induced asthma    worse in winter  . H/O bone density study 2013  . H/O cold sores   . H/O colonoscopy 2009  . History of endometriosis   . Hyperlipidemia   . Hypertension   . Injury of tendon of rotator cuff 2015   gym injury (right)  . Knee pain 08/2015   L>R  . Migraine with typical aura   . Mild CAD 04/04/2018   Mild disease on coronary CT-A 01/2018.  . Morbid obesity ([place]) 09/04/12   BMI 49.4 kg/m^2   . Osteoarthritis   . Trigger finger of left thumb 08/2018   injected  by Dr. Fredna Dow 09/18/18    PAST SURGICAL HISTORY: Past Surgical History:  Procedure Laterality Date  . BREAST BIOPSY  2010  . BREAST LUMPECTOMY  2010  . BREAST LUMPECTOMY WITH RADIOACTIVE SEED LOCALIZATION Left 07/23/2015   Procedure: LEFT BREAST SEED GUIDED EXCISION;  Surgeon: Rolm Bookbinder, MD;  Location: [facility];  Service: General;  Laterality: Left;  . BREATH TEK H PYLORI  08/28/2012   Procedure: BREATH TEK H PYLORI;  Surgeon: Pedro Earls, MD;  Location: [facility];  Service: General;  Laterality: N/A;  . CHOLECYSTECTOMY  2007  . EXPLORATORY LAPAROTOMY  1983   endometriosis  . GANGLION CYST EXCISION Left 1999   Foot  . TOTAL ABDOMINAL HYSTERECTOMY W/ BILATERAL  SALPINGOOPHORECTOMY  1984   endometriosis (and appendectomy)    SOCIAL HISTORY: Social History   Tobacco Use  . Smoking status: Never Smoker  . Smokeless tobacco: Never Used  Substance Use Topics  . Alcohol use: No  . Drug use: No    FAMILY HISTORY: Family History  Problem Relation Age of Onset  . Diabetes Father   . Heart attack Father 20  . Heart disease Father   . Hypertension Father   . Peripheral Artery Disease Father        stents at 41  . Diabetes Paternal Grandfather   . Heart disease Paternal Grandfather   . Osteoporosis Maternal Grandmother   . Heart failure Maternal Grandmother   . Thyroid disease Mother        hypothyroidism  . Heart disease Mother 64       CABG at 29  . Hypertension Mother   . Diverticulitis Mother 64       perforation, required surgery  . Pneumonia Sister   . Cancer Maternal Grandfather        lung  . Dementia Paternal Grandmother   . Cancer Paternal Grandmother        melanoma on her foot (not related to death)  . Breast cancer Paternal Aunt        83's  . Cancer Maternal Aunt        lung  . Cancer Maternal Uncle        lung  . Breast cancer Cousin        mets to lung  . Congestive Heart Failure Maternal Uncle   . Breast cancer Cousin      ROS: Review of Systems  Constitutional: Negative for weight loss.  Gastrointestinal: Negative for diarrhea, nausea and vomiting.  Endo/Heme/Allergies:       Negative for hypoglycemia Negative for polyphagia    PHYSICAL EXAM: Blood pressure 109/71, pulse 79, temperature (!) 97.5 F (36.4 C), temperature source Oral, height 5\' 3"  (1.6 m), weight 261 lb (118.4 kg), last menstrual period 10/18/1982, SpO2 98 %. Body mass index is 46.23 kg/m. Physical Exam Vitals signs reviewed.  Constitutional:      Appearance: Normal appearance. She is obese.  Cardiovascular:     Rate and Rhythm: Normal rate.     Pulses: Normal pulses.  Pulmonary:     Effort: Pulmonary effort is normal.  Musculoskeletal: Normal range of motion.  Skin:    General: Skin is warm and dry.  Neurological:     Mental Status: She is alert and oriented to person, place, and time.  Psychiatric:        Mood and Affect: Mood normal.        Behavior: Behavior normal.     RECENT LABS AND TESTS: BMET    Component Value Date/Time   NA 139 07/13/2018 1031   NA 139 11/21/2015 0826   K 4.5 07/13/2018 1031   K 4.7 11/21/2015 0826   CL 102 07/13/2018 1031   CO2 18 (L) 07/13/2018 1031   CO2 24 11/21/2015 0826   GLUCOSE 113 (H) 07/13/2018 1031   GLUCOSE 96 09/30/2016 0811   GLUCOSE 125 11/21/2015 0826   BUN 12 07/13/2018 1031   BUN 12.5 11/21/2015 0826   CREATININE 0.83 07/13/2018 1031   CREATININE 0.93 09/30/2016 0811   CREATININE 1.0 11/21/2015 0826   CALCIUM 9.2 07/13/2018 1031   CALCIUM 9.6 11/21/2015 0826   GFRNONAA 77 07/13/2018 1031   GFRAA 89 07/13/2018 1031   Lab  Results  Component Value Date   HGBA1C 7.0 (H) 07/13/2018   HGBA1C 6.0 (A) 04/13/2018   HGBA1C 6.2 12/07/2017   HGBA1C 6.1 01/05/2017   HGBA1C 6.0 09/30/2016   Lab Results  Component Value Date   INSULIN 59.1 (H) 07/13/2018   CBC    Component Value Date/Time   WBC 8.7 12/07/2017 1619   WBC 7.8 09/30/2016 0811   RBC 4.57  12/07/2017 1619   RBC 4.92 09/30/2016 0811   HGB 14.1 12/07/2017 1619   HGB 15.1 11/21/2015 0826   HCT 42.5 12/07/2017 1619   HCT 46.3 11/21/2015 0826   PLT 121 (L) 12/07/2017 1619   MCV 93 12/07/2017 1619   MCV 95.1 11/21/2015 0826   MCH 30.9 12/07/2017 1619   MCH 32.3 09/30/2016 0811   MCHC 33.2 12/07/2017 1619   MCHC 33.1 09/30/2016 0811   RDW 14.3 12/07/2017 1619   RDW 15.1 (H) 11/21/2015 0826   LYMPHSABS 3.5 (H) 12/07/2017 1619   LYMPHSABS 2.6 11/21/2015 0826   MONOABS 546 09/30/2016 0811   MONOABS 0.7 11/21/2015 0826   EOSABS 0.2 12/07/2017 1619   BASOSABS 0.0 12/07/2017 1619   BASOSABS 0.0 11/21/2015 0826   Iron/TIBC/Ferritin/ %Sat    Component Value Date/Time   IRON 84 12/07/2017 1619   Lipid Panel     Component Value Date/Time   CHOL 133 12/07/2017 1619   TRIG 69 12/07/2017 1619   HDL 71 12/07/2017 1619   CHOLHDL 1.9 12/07/2017 1619   CHOLHDL 2.3 09/30/2016 0811   VLDL 26 09/30/2016 0811   LDLCALC 48 12/07/2017 1619   Hepatic Function Panel     Component Value Date/Time   PROT 6.5 07/13/2018 1031   PROT 7.2 11/21/2015 0826   ALBUMIN 4.0 07/13/2018 1031   ALBUMIN 3.8 11/21/2015 0826   AST 51 (H) 07/13/2018 1031   AST 24 11/21/2015 0826   ALT 24 07/13/2018 1031   ALT 24 11/21/2015 0826   ALKPHOS 93 07/13/2018 1031   ALKPHOS 75 11/21/2015 0826   BILITOT 0.4 07/13/2018 1031   BILITOT 0.58 11/21/2015 0826      Component Value Date/Time   TSH 2.200 12/07/2017 1619   TSH 1.79 03/31/2016 0859   TSH 2.711 09/06/2012 0741      OBESITY BEHAVIORAL INTERVENTION VISIT  Today's visit was # 9   Starting weight: 262 lbs Starting date: 07/13/2018 Today's weight :261 lbs Today's date: 11/29/2018 Total lbs lost to date: 1   ASK: We discussed the diagnosis of obesity with Cindy Carey today and Alexza agreed to give [name] permission to discuss obesity behavioral modification therapy today.  ASSESS: Cindy Carey has the diagnosis of obesity and her BMI today  is 46.25 Donyell is in the action stage of change   ADVISE: Bindu was educated on the multiple health risks of obesity as well as the benefit of weight loss to improve her health. She was advised of the need for long term treatment and the importance of lifestyle modifications to improve her current health and to decrease her risk of future health problems.  AGREE: Multiple dietary modification options and treatment options were discussed and  Imajean agreed to follow the recommendations documented in the above note.  ARRANGE: Eliette was educated on the importance of frequent visits to treat obesity as outlined per CMS and USPSTF guidelines and agreed to schedule her next follow up appointment today.  I, Tammy Wysor, am acting as Location manager for [employer], FNP-C.  I have reviewed the above documentation  for accuracy and completeness, and I agree with the above.  - Marina Desire, FNP-C.

## 2018-12-08 ENCOUNTER — Encounter: Payer: Self-pay | Admitting: Podiatry

## 2018-12-12 NOTE — Progress Notes (Signed)
Chief Complaint  Patient presents with  . Annual Exam    fasting annual exam no pap-sees Dr.Jertson. Also sees Dr. Claudean Kinds. Is having issues waking up around 1am and having a hard time getting back to sleep.     Cindy Carey is a 63 y.o. female who presents for a complete physical.  She has the following concerns:  Waking up at 1am and having a hard time getting back to sleep. This started in November. Feels refreshed and wide awake. Takes a couple of hours to get back to sleep.  Using relaxation app, goes into other room.  She is very tired on the days this occurs. Denies feeling stressed, or mind racing/thinking. Goes to bed 9:30; tried staying up later, but that didn't help.  No hot flashes.   Diabetes: Currently on Trulicity (1.2/XN) and Metformin (2097m/d), tolerating without side effects.Previously took ICashion(stopped for vaginal itching, which ultimately resolved with use of vaseline since then). Sugars have been running90's to low 100's in the mornings.  PP are 140's Denies hypoglycemia Last eye exam: 10/2018 Checks feet regularly without concerns.  Lab Results  Component Value Date   HGBA1C 7.0 (H) 07/13/2018   ASA 874madded 03/2018. Calcium score of 1  Left PF--s/p injection by Dr. RePaulla Dollyn 09/2018. Still having pain in the left heel.  Wears orthotics.  Limps by the end of the day. Limits her activity and exercise. Doesn't have f/u scheduled, but plans to.  Trigger finger L thumb 09/2018, treated with injection by Dr. KuFredna Dow Starting to trigger again just recently. Had a good response to the first injection, plans on scheduling another visit.  Hypertension follow-up: Blood pressures are normal (checks at work, and at weight loss clinic).Denies dizziness, headaches, chest pain.  Denies side effects of medications.  Some leg cramps at night, most nights.  Has tried various measures (apple cider vinegar, is well hydrated) without benefit. Seems to be more  frequent than in the past.  She continues on Mg supplement (to help prevent migraines).  BP Readings from Last 3 Encounters:  11/29/18 109/71  11/06/18 118/77  10/09/18 114/77   Migraines--has aura,green fuzzy vision change, takes ibuprofen with the aura; rarely needs other meds (phenergan) unless she can't treat early. Last headache/aura was 4-5 months ago. She has been off topamax for over a year; she had significant hair thinning, which has improved since being off the medication. She hasn't noticed any increased headache frequency since being off it.   Hyperlipidemia follow-up: Patient is reportedly following a low-fat, low cholesterol diet. Compliant with medications and denies medication side effects. Due for recheck. Lab Results  Component Value Date   CHOL 133 12/07/2017   HDL 71 12/07/2017   LDLCALC 48 12/07/2017   TRIG 69 12/07/2017   CHOLHDL 1.9 12/07/2017    Left breast intraductal papilloma with atypical ductal hyperplasia diagnosed 07/15/2015; status post lumpectomy 07/23/2015. Due to lifetime risk of breast cancer of 25%, she is on risk lowering therapy with Tamoxifen. She saw Dr. GuLindi Adieast in 02/2018. She is tolerating tamoxifen well, without hot flashes or myalgias.  She [facility] under [facility] care [facility]. She has not had significant weight loss with their program. Doesn't follow it exactly when she is exhausted when she gets home.  But most of the time she follows it. She is due for fasting labs with them, and they are requesting a vitamin D level to be repeated, per  pt. She is fasting today. She is still considering bariatric surgery (and this qualifies as monitored weight loss program), if covered. Needs to check back with them.   Immunization History  Administered Date(s) Administered  . Influenza-Unspecified 06/27/2015, 07/03/2017, 07/05/2018  . Pneumococcal Polysaccharide-23 03/31/2016  . Tdap  07/03/2015  . Zoster Recombinat (Shingrix) 12/07/2017, 02/10/2018   Gets flu shots yearly at work Last Pap smear:  per GYN, s/p hysterectomy. Last saw Dr. Talbert Nan 09/2018 Last mammogram: 07/2018 Last colonoscopy: 02/2018, hyperplastic polyp Dr. Collene Mares Last DEXA: 09/2017 T-1.3 at L fem neck Dentist: twice yearly Ophtho: yearly, Dr. Claudean Kinds (retired) Exercise: limited due to heel pain.  30 minutes of walking 3x/week. Hasn't been going to [employer] as the treadmill and bike bother her heel. Vitamin D-OH 50 in 06/2018  Past Medical History:  Diagnosis Date  . Anemia   . Asthma   . Blood transfusion without reported diagnosis 1983  . Diabetes mellitus without complication ([place]) 6962   T2DM  . Dyspareunia   . Exercise-induced asthma    worse in winter  . H/O bone density study 2013  . H/O cold sores   . H/O colonoscopy 2009  . History of endometriosis   . Hyperlipidemia   . Hypertension   . Injury of tendon of rotator cuff 2015   gym injury (right)  . Knee pain 08/2015   L>R  . Migraine with typical aura   . Mild CAD 04/04/2018   Mild disease on coronary CT-A 01/2018.  . Morbid obesity ([place]) 09/04/12   BMI 49.4 kg/m^2   . Osteoarthritis   . Plantar fasciitis, left 2019  . Trigger finger of left thumb 08/2018   injected by Dr. Fredna Dow 09/18/18    Past Surgical History:  Procedure Laterality Date  . BREAST BIOPSY  2010  . BREAST LUMPECTOMY  2010  . BREAST LUMPECTOMY WITH RADIOACTIVE SEED LOCALIZATION Left 07/23/2015   Procedure: LEFT BREAST SEED GUIDED EXCISION;  Surgeon: Rolm Bookbinder, MD;  Location: [facility];  Service: General;  Laterality: Left;  . BREATH TEK H PYLORI  08/28/2012   Procedure: BREATH TEK H PYLORI;  Surgeon: Pedro Earls, MD;  Location: [facility];  Service: General;  Laterality: N/A;  . CHOLECYSTECTOMY  2007  . EXPLORATORY LAPAROTOMY  1983   endometriosis  . GANGLION CYST EXCISION Left 1999   Foot  . TOTAL ABDOMINAL  HYSTERECTOMY W/ BILATERAL SALPINGOOPHORECTOMY  1984   endometriosis (and appendectomy)    Social History   Socioeconomic History  . Marital status: Married    Spouse name: Hephzibah Strehle  . Number of children: Not on file  . Years of education: Not on file  . Highest education level: Not on file  Occupational History  . Occupation: Designer, jewellery    Comment: [employer]  Social Needs  . Financial resource strain: Not on file  . Food insecurity:    Worry: Not on file    Inability: Not on file  . Transportation needs:    Medical: Not on file    Non-medical: Not on file  Tobacco Use  . Smoking status: Never Smoker  . Smokeless tobacco: Never Used  Substance and Sexual Activity  . Alcohol use: No  . Drug use: No  . Sexual activity: Not Currently    Partners: Male    Birth control/protection: Surgical  Lifestyle  . Physical activity:    Days per week: Not on file    Minutes per session: Not on file  .  Stress: Not on file  Relationships  . Social connections:    Talks on phone: Not on file    Gets together: Not on file    Attends religious service: Not on file    Active member of club or organization: Not on file    Attends meetings of clubs or organizations: Not on file    Relationship status: Not on file  . Intimate partner violence:    Fear of current or ex partner: Not on file    Emotionally abused: Not on file    Physically abused: Not on file    Forced sexual activity: Not on file  Other Topics Concern  . Not on file  Social History Narrative   Married, no children. 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    Family History  Problem Relation Age of Onset  . Diabetes Father   . Heart attack Father 73  . Heart disease Father   . Hypertension Father   . Peripheral Artery Disease Father        stents at 4  . Diabetes Paternal Grandfather    . Heart disease Paternal Grandfather   . Osteoporosis Maternal Grandmother   . Heart failure Maternal Grandmother   . Thyroid disease Mother        hypothyroidism  . Heart disease Mother 31       CABG at 78  . Hypertension Mother   . Diverticulitis Mother 8       perforation, required surgery  . Pneumonia Sister   . Cancer Maternal Grandfather        lung  . Dementia Paternal Grandmother   . Cancer Paternal Grandmother        melanoma on her foot (not related to death)  . Breast cancer Paternal Aunt        55's  . Cancer Maternal Aunt        lung  . Cancer Maternal Uncle        lung  . Breast cancer Cousin        mets to lung  . Congestive Heart Failure Maternal Uncle   . Breast cancer Cousin     Outpatient Encounter Medications as of 12/14/2018  Medication Sig Note  . ACCU-CHEK FASTCLIX LANCETS MISC USE TO CHECK BLOOD SUGAR TWICE A DAY AS DIRECTED   . ACCU-CHEK GUIDE test strip USE TO CHECK BLOOD SUGAR TWICE A DAY AS DIRECTED   . aspirin EC 81 MG tablet Take 81 mg by mouth daily.   . Calcium Carbonate-Vit D-Min (CALCIUM 600+D PLUS MINERALS) 600-400 MG-UNIT TABS Take 1 tablet by mouth 2 (two) times daily.   . cyclobenzaprine (FLEXERIL) 10 MG tablet Take 1 tablet (10 mg total) by mouth 3 (three) times daily as needed for muscle spasms. 12/14/2018: 1/2 tablet once or twice a month, prn for shoulder pain  . Dulaglutide (TRULICITY) 1.5 QJ/3.3LK SOPN INJECT 1.5 MG INTO THE SKIN EVERY 7 DAYS.   [name] lisinopril (PRINIVIL,ZESTRIL) 10 MG tablet Take 1 tablet (10 mg total) by mouth daily.   . Magnesium 250 MG TABS Take 250 mg by mouth daily. 03/31/2016: Takes for migraine prevention  . metFORMIN (GLUCOPHAGE-XR) 500 MG 24 hr tablet Take 2 tablets (1,000 mg total) by mouth 2 (two) times daily.   [name] METRONIDAZOLE, TOPICAL, 0.75 % LOTN Apply 1 application topically 2 (two) times daily.   [name]  Multiple Vitamins-Minerals (MULTIVITAMIN PO) Take 1 tablet by mouth daily. Reported on 03/31/2016   .  simvastatin (ZOCOR) 40 MG tablet Take 1 tablet (40 mg total) by mouth every evening.   . tamoxifen (NOLVADEX) 20 MG tablet Take 1 tablet (20 mg total) by mouth daily.   . [DISCONTINUED] meloxicam (MOBIC) 15 MG tablet Take 1 tablet (15 mg total) by mouth daily.   [name] EPINEPHrine 0.3 mg/0.3 mL IJ SOAJ injection Inject 0.3 mLs (0.3 mg total) into the muscle once as needed. Reported on 04/30/2016 (Patient not taking: Reported on 12/14/2018)   . promethazine (PHENERGAN) 25 MG tablet Take 1 tablet (25 mg total) by mouth every 8 (eight) hours as needed for nausea. (Patient not taking: Reported on 12/14/2018)   . [DISCONTINUED] albuterol (PROVENTIL HFA;VENTOLIN HFA) 108 (90 BASE) MCG/ACT inhaler Inhale 2 puffs into the lungs every 6 (six) hours as needed for wheezing. Reported on 03/31/2016   . [DISCONTINUED] Biotin 10000 MCG TABS Take by mouth.    No facility-administered encounter medications on file as of 12/14/2018.     Allergies  Allergen Reactions  . Bee Venom Anaphylaxis  . Shellfish Allergy Anaphylaxis  . Penicillins Hives   ROS: The patient denies anorexia, fever, vision changes, decreased hearing, ear pain, sore throat, breast concerns, chest pain, palpitations, dizziness, syncope, dyspnea on exertion, cough, swelling, nausea, vomiting, diarrhea, constipation, abdominal pain, melena, hematochezia, indigestion/heartburn, hematuria, incontinence, dysuria, vaginal bleeding, discharge, odor, genital lesions, numbness, tingling, weakness, tremor, suspicious skin lesions, depression, anxiety, abnormal bleeding/bruising, or enlarged lymph nodes. L>R knee pain--not bothering her since less active due to left foot pain. L heel/foot pain Left thumb trigger finger recurring. Rosacea is controlled, worse in the summer and certain foods. Migraines as per HPI, mild/infrequent Hoarseness as day progresses No longer having vaginal itching.  Nightly leg cramps Interrupted sleep per HPI Lesion at right  wrist--changed slightly Sick in January with bad URI, didn't need albuterol   PHYSICAL EXAM:  BP 130/86   Pulse 80   Ht 5' 3"  (1.6 m)   Wt 263 lb 6.4 oz (119.5 kg)   LMP 10/18/1982   BMI 46.66 kg/m    124/80 on repeat by MD  Wt Readings from Last 3 Encounters:  12/14/18 263 lb 6.4 oz (119.5 kg)  11/29/18 261 lb (118.4 kg)  11/06/18 261 lb (118.4 kg)   257# at CPE 11/2017 273.6# in 06/2016   General Appearance:  Alert, cooperative, no distress, appears stated age, obese  Head:  Normocephalic, without obvious abnormality, atraumatic  Eyes:  PERRL, conjunctiva/corneas clear, EOM's intact, fundi benign  Ears:  Normal TM's and external ear canals  Nose: Nares normal, mucosa is mildly edematous; no sinustenderness  Throat: Lips, mucosa, and tongue normal; teeth and gums normal  Neck: Supple, no lymphadenopathy; thyroid: no enlargement/tenderness/ nodules; no carotid bruit or JVD  Back:  Spine nontender, no curvature, ROM normal, no CVAtenderness  Lungs:  Clear to auscultation bilaterally without wheezes, rales orronchi; respirations unlabored  Chest Wall:  No tenderness or deformity  Heart:  Regular rate and rhythm, S1 and S2 normal, no murmur, rub or gallop  Breast Exam:  Deferred to GYN  Abdomen:  Soft, non-tender, nondistended, normoactive bowel sounds, no masses, no hepatosplenomegaly  Genitalia:  Deferred to GYN     Extremities: No clubbing, cyanosis or edema. Normal diabetic foot exam. She is nontender at PF/anteromedial calcaneous on the left.  Her discomfort is more lateral at the left heel.  Pulses: 2+ and symmetric all  extremities  Skin: Skin color, texture, turgor normal, no rashes or lesions. Some diffuse hair thinning central anterior scalp. Right dorsum of wrist--SK with some darker pigmentation centrally.  0.75 x 0.7cm  Lymph nodes: Cervical, supraclavicular, and axillary nodes normal  Neurologic: CNII-XII intact,  normal strength, sensation and gait; reflexes 2+ and symmetric throughout  Psych: Normal mood, affect, hygiene and grooming.  Diabetic foot exam performed, normal.  Lab Results  Component Value Date   HGBA1C 6.8 (A) 12/14/2018    ASSESSMENT/PLAN:  Annual physical exam - Plan: POCT Urinalysis DIP (Proadvantage Device), Comprehensive metabolic panel, Lipid panel, CBC with Differential/Platelet, VITAMIN D 25 Hydroxy (Vit-D Deficiency, Fractures), TSH, Microalbumin / creatinine urine ratio, Magnesium  Class 3 severe obesity with serious comorbidity and body mass index (BMI) of 45.0 to 49.9 in adult, unspecified obesity type ([place]) - she is aware of the risks, and unfortunately hasn't had much weight loss success thus far with [facility]. f/u with [facility] re: bariatric surgery  Essential hypertension - well controlled - Plan: Comprehensive metabolic panel  Migraine with typical aura - doing well, infrequent  Hyperlipidemia, unspecified hyperlipidemia type - continue statin - Plan: Lipid panel  Diabetes mellitus without complication (HCC) - Y5T 6.8; consider adding farxiga or jardiance for cardio-protection (and improved glycemic control); Ca score of 1 - Plan: HgB A1c, Comprehensive metabolic panel, TSH, Microalbumin / creatinine urine ratio  Medication monitoring encounter - Plan: Comprehensive metabolic panel, Lipid panel, CBC with Differential/Platelet, VITAMIN D 25 Hydroxy (Vit-D Deficiency, Fractures), Magnesium  Diabetes mellitus with coincident hypertension (HCC)  Insomnia, unspecified type - terminal--reviewed sleep hygiene, medications including belsomra, melatonin, low dose xanax prn only. Enc increased exercise  Leg cramps - stay well hydrated; consider trying bar of white soap. Will check Mg (already taking supplement)   c-met, CBC, TSH, urine microalbumin, lipid, Mg  Pt asking if possible to get Tamoxifen refill here, rather than seeing Dr. Lindi Adie this  May, reportedly needed through fall 2021. Still dealing with his office over $110 room charge last year. Discussed that I would send a message to Dr Lindi Adie to discuss this.   Discussed monthly self breast exams and yearly mammograms; at least 30 minutes of aerobic activity at least 5 days/week, weight-bearing exercise; proper sunscreen use reviewed; healthy diet, including goals of calcium and vitamin D intake and alcohol recommendations (less than or equal to 1 drink/day) reviewed; regular seatbelt use; changing batteries in smoke detectors. Immunization recommendations discussed--continue yearly flu shots. Colonoscopy recommendations reviewed, UTD  Consider starting Jardiance or Farxiga to help with diabetes control, doubt much weight loss, but couldn't hurt.  If has recurrent vaginal itching, may not tolerate.  Discussed belsomra, melatonin, tiny dose of something (xanax) prn Discussed exercise--?if sleep is worse related to less exercise due to foot issues. Recommended trying the arm machine at gym.  F/u 6 mos, sooner prn.

## 2018-12-13 NOTE — Patient Instructions (Addendum)
  HEALTH MAINTENANCE RECOMMENDATIONS:  It is recommended that you get at least 30 minutes of aerobic exercise at least 5 days/week (for weight loss, you may need as much as 60-90 minutes). This can be any activity that gets your heart rate up. This can be divided in 10-15 minute intervals if needed, but try and build up your endurance at least once a week.  Weight bearing exercise is also recommended twice weekly.  Eat a healthy diet with lots of vegetables, fruits and fiber.  "Colorful" foods have a lot of vitamins (ie green vegetables, tomatoes, red peppers, etc).  Limit sweet tea, regular sodas and alcoholic beverages, all of which has a lot of calories and sugar.  Up to 1 alcoholic drink daily may be beneficial for women (unless trying to lose weight, watch sugars).  Drink a lot of water.  Calcium recommendations are 1200-1500 mg daily (1500 mg for postmenopausal women or women without ovaries), and vitamin D 1000 IU daily.  This should be obtained from diet and/or supplements (vitamins), and calcium should not be taken all at once, but in divided doses.  Monthly self breast exams and yearly mammograms for women over the age of 46 is recommended.  Sunscreen of at least SPF 30 should be used on all sun-exposed parts of the skin when outside between the hours of 10 am and 4 pm (not just when at beach or pool, but even with exercise, golf, tennis, and yard work!)  Use a sunscreen that says "broad spectrum" so it covers both UVA and UVB rays, and make sure to reapply every 1-2 hours.  Remember to change the batteries in your smoke detectors when changing your clock times in the spring and fall.  Use your seat belt every time you are in a car, and please drive safely and not be distracted with cell phones and texting while driving.  Consider starting Jardiance or Wilder Glade to help with diabetes control, doubt much weight loss, but couldn't hurt.  If has recurrent vaginal itching, may not tolerate. Let's  see what [facility] has to say about it  Discussed belsomra, melatonin, if sleep remains an issue. Discussed exercise as helping with sleep--consider arm machine at the gym.

## 2018-12-14 ENCOUNTER — Encounter: Payer: Self-pay | Admitting: Family Medicine

## 2018-12-14 ENCOUNTER — Ambulatory Visit (INDEPENDENT_AMBULATORY_CARE_PROVIDER_SITE_OTHER): Payer: No Typology Code available for payment source | Admitting: Family Medicine

## 2018-12-14 VITALS — BP 124/80 | HR 80 | Ht 63.0 in | Wt 263.4 lb

## 2018-12-14 DIAGNOSIS — E785 Hyperlipidemia, unspecified: Secondary | ICD-10-CM

## 2018-12-14 DIAGNOSIS — Z0000 Encounter for general adult medical examination without abnormal findings: Secondary | ICD-10-CM | POA: Diagnosis not present

## 2018-12-14 DIAGNOSIS — R252 Cramp and spasm: Secondary | ICD-10-CM

## 2018-12-14 DIAGNOSIS — I10 Essential (primary) hypertension: Secondary | ICD-10-CM

## 2018-12-14 DIAGNOSIS — G4700 Insomnia, unspecified: Secondary | ICD-10-CM

## 2018-12-14 DIAGNOSIS — G43109 Migraine with aura, not intractable, without status migrainosus: Secondary | ICD-10-CM | POA: Diagnosis not present

## 2018-12-14 DIAGNOSIS — Z5181 Encounter for therapeutic drug level monitoring: Secondary | ICD-10-CM

## 2018-12-14 DIAGNOSIS — E119 Type 2 diabetes mellitus without complications: Secondary | ICD-10-CM

## 2018-12-14 DIAGNOSIS — Z6841 Body Mass Index (BMI) 40.0 and over, adult: Secondary | ICD-10-CM

## 2018-12-14 DIAGNOSIS — E66813 Obesity, class 3: Secondary | ICD-10-CM

## 2018-12-14 LAB — POCT URINALYSIS DIP (PROADVANTAGE DEVICE)
Bilirubin, UA: NEGATIVE
Blood, UA: NEGATIVE
Glucose, UA: NEGATIVE mg/dL
Ketones, POC UA: NEGATIVE mg/dL
Nitrite, UA: NEGATIVE
PROTEIN UA: NEGATIVE mg/dL
Specific Gravity, Urine: 1.025
UUROB: NEGATIVE
pH, UA: 6 (ref 5.0–8.0)

## 2018-12-14 LAB — POCT GLYCOSYLATED HEMOGLOBIN (HGB A1C): Hemoglobin A1C: 6.8 % — AB (ref 4.0–5.6)

## 2018-12-15 LAB — COMPREHENSIVE METABOLIC PANEL
ALK PHOS: 97 IU/L (ref 39–117)
ALT: 29 IU/L (ref 0–32)
AST: 56 IU/L — ABNORMAL HIGH (ref 0–40)
Albumin/Globulin Ratio: 1.5 (ref 1.2–2.2)
Albumin: 4 g/dL (ref 3.8–4.8)
BUN/Creatinine Ratio: 11 — ABNORMAL LOW (ref 12–28)
BUN: 9 mg/dL (ref 8–27)
Bilirubin Total: 0.4 mg/dL (ref 0.0–1.2)
CO2: 22 mmol/L (ref 20–29)
Calcium: 10.4 mg/dL — ABNORMAL HIGH (ref 8.7–10.3)
Chloride: 103 mmol/L (ref 96–106)
Creatinine, Ser: 0.8 mg/dL (ref 0.57–1.00)
GFR calc Af Amer: 92 mL/min/{1.73_m2} (ref >59)
GFR calc non Af Amer: 80 mL/min/{1.73_m2} (ref >59)
Globulin, Total: 2.7 g/dL (ref 1.5–4.5)
Glucose: 105 mg/dL — ABNORMAL HIGH (ref 65–99)
Potassium: 4.6 mmol/L (ref 3.5–5.2)
Sodium: 140 mmol/L (ref 134–144)
Total Protein: 6.7 g/dL (ref 6.0–8.5)

## 2018-12-15 LAB — CBC WITH DIFFERENTIAL/PLATELET
Basophils Absolute: 0 10*3/uL (ref 0.0–0.2)
Basos: 0 %
EOS (ABSOLUTE): 0.2 10*3/uL (ref 0.0–0.4)
Eos: 2 %
Hematocrit: 40.3 % (ref 34.0–46.6)
Hemoglobin: 13.6 g/dL (ref 11.1–15.9)
IMMATURE GRANS (ABS): 0 10*3/uL (ref 0.0–0.1)
Immature Granulocytes: 0 %
Lymphocytes Absolute: 2.8 10*3/uL (ref 0.7–3.1)
Lymphs: 37 %
MCH: 31.5 pg (ref 26.6–33.0)
MCHC: 33.7 g/dL (ref 31.5–35.7)
MCV: 93 fL (ref 79–97)
Monocytes Absolute: 0.7 10*3/uL (ref 0.1–0.9)
Monocytes: 9 %
Neutrophils Absolute: 3.8 10*3/uL (ref 1.4–7.0)
Neutrophils: 52 %
Platelets: 94 10*3/uL — CL (ref 150–450)
RBC: 4.32 x10E6/uL (ref 3.77–5.28)
RDW: 13.4 % (ref 11.7–15.4)
WBC: 7.5 10*3/uL (ref 3.4–10.8)

## 2018-12-15 LAB — MICROALBUMIN / CREATININE URINE RATIO
Creatinine, Urine: 204.8 mg/dL
Microalb/Creat Ratio: 21 mg/g creat (ref 0–29)
Microalbumin, Urine: 43 ug/mL

## 2018-12-15 LAB — LIPID PANEL
Chol/HDL Ratio: 2.1 ratio (ref 0.0–4.4)
Cholesterol, Total: 120 mg/dL (ref 100–199)
HDL: 58 mg/dL (ref >39)
LDL Calculated: 42 mg/dL (ref 0–99)
Triglycerides: 100 mg/dL (ref 0–149)
VLDL Cholesterol Cal: 20 mg/dL (ref 5–40)

## 2018-12-15 LAB — TSH: TSH: 2.24 u[IU]/mL (ref 0.450–4.500)

## 2018-12-15 LAB — VITAMIN D 25 HYDROXY (VIT D DEFICIENCY, FRACTURES): Vit D, 25-Hydroxy: 48.7 ng/mL (ref 30.0–100.0)

## 2018-12-15 LAB — MAGNESIUM: Magnesium: 1.5 mg/dL — ABNORMAL LOW (ref 1.6–2.3)

## 2018-12-16 ENCOUNTER — Encounter: Payer: Self-pay | Admitting: Family Medicine

## 2018-12-18 ENCOUNTER — Encounter (INDEPENDENT_AMBULATORY_CARE_PROVIDER_SITE_OTHER): Payer: Self-pay | Admitting: Family Medicine

## 2018-12-20 ENCOUNTER — Ambulatory Visit (INDEPENDENT_AMBULATORY_CARE_PROVIDER_SITE_OTHER): Payer: No Typology Code available for payment source | Admitting: Family Medicine

## 2018-12-20 ENCOUNTER — Other Ambulatory Visit: Payer: Self-pay | Admitting: Family Medicine

## 2018-12-20 VITALS — BP 125/81 | HR 74 | Temp 97.6°F | Ht 63.0 in | Wt 263.0 lb

## 2018-12-20 DIAGNOSIS — E119 Type 2 diabetes mellitus without complications: Secondary | ICD-10-CM | POA: Diagnosis not present

## 2018-12-20 DIAGNOSIS — Z6841 Body Mass Index (BMI) 40.0 and over, adult: Secondary | ICD-10-CM | POA: Diagnosis not present

## 2018-12-20 DIAGNOSIS — Z9189 Other specified personal risk factors, not elsewhere classified: Secondary | ICD-10-CM

## 2018-12-20 DIAGNOSIS — E559 Vitamin D deficiency, unspecified: Secondary | ICD-10-CM

## 2018-12-20 MED ORDER — DAPAGLIFLOZIN PROPANEDIOL 5 MG PO TABS: 5.0000 mg | ORAL_TABLET | Freq: Every day | ORAL | 0 refills | Status: DC

## 2018-12-20 MED ORDER — METFORMIN HCL ER 500 MG PO TB24: 1000.0000 mg | ORAL_TABLET | Freq: Two times a day (BID) | ORAL | 1 refills | Status: DC

## 2018-12-20 MED FILL — metFORMIN HCL ER 500 MG TB2: 500 | 90 days supply | Qty: 360 | Fill #0 | Status: TO

## 2018-12-21 ENCOUNTER — Ambulatory Visit: Payer: No Typology Code available for payment source | Admitting: Podiatry

## 2018-12-21 ENCOUNTER — Encounter (INDEPENDENT_AMBULATORY_CARE_PROVIDER_SITE_OTHER): Payer: Self-pay | Admitting: Family Medicine

## 2018-12-21 DIAGNOSIS — E559 Vitamin D deficiency, unspecified: Secondary | ICD-10-CM | POA: Insufficient documentation

## 2018-12-21 MED FILL — FARXIGA 5 MG TABLET: 5 | 30 days supply | Qty: 30 | Fill #0

## 2018-12-21 NOTE — Progress Notes (Signed)
Office: [phone]  /  Fax: [phone]   HPI:   Chief Complaint: OBESITY Cindy Carey is here to discuss her progress with her obesity treatment plan. She is on the Pescatarian meal plan and is following her eating plan approximately 95% of the time. She states she is walking 30 minutes 3 times per week. Cindy Carey states she is sticking to the plan fairly well but does not always eat all of the food on the plan due to lack of hunger. She does admit to eating breakfast off the plan - banana, bread, and peanut butter. She states she is bored with the food options. She is trying to eat other meats besides fish. She states she has increased her meal prepping because her work schedule has improved. Her weight is 263 lb (119.3 kg) today and has not lost weight since her last visit. She has lost 0 lbs since starting treatment with [name].  Diabetes Mellitus Cindy Carey has a diagnosis of diabetes mellitus. Cindy Carey denies any hypoglycemic episodes. Last A1c was elevated at 6.8 on 12/14/2018. She is currently on metformin and Trulicity 1.5 mg. Her PCP suggested adding SGLT2 for tighter control. She has been working on intensive lifestyle modifications including diet, exercise, and weight loss to help control her blood glucose levels.  Vitamin D deficiency Cindy Carey has a diagnosis of Vitamin D deficiency which is nearly at goal with a level of 48.7 last week. She is currently taking 600 of Calcium with D once daily. She had previously taken this twice daily but her calcium was elevated.   At risk for osteopenia and osteoporosis Cindy Carey is at higher risk of osteopenia and osteoporosis due to Vitamin D deficiency.   ASSESSMENT AND PLAN:  Type 2 diabetes mellitus without complication, without long-term current use of insulin (Cindy Carey) - Plan: dapagliflozin propanediol (FARXIGA) 5 MG TABS tablet  Vitamin D deficiency  At risk for osteoporosis  Class 3 severe obesity with serious comorbidity and body mass index (BMI) of 45.0 to 49.9  in adult, unspecified obesity type ([place])  PLAN:  Diabetes Mellitus Cindy Carey has been given extensive diabetes education by myself today including ideal fasting and post-prandial blood glucose readings, individual ideal Hgb A1c goals  and hypoglycemia prevention. We discussed the importance of good blood sugar control to decrease the likelihood of diabetic complications such as nephropathy, neuropathy, limb loss, blindness, coronary artery disease, and death. We discussed the importance of intensive lifestyle modification including diet, exercise and weight loss as the first line treatment for diabetes. Cindy Carey agrees to start [place] 5 mg qam #30 with 0 refills and will continue her Trulicity and metformin. Cindy Carey agrees to follow-up with our clinic in 3 weeks.  Vitamin D Deficiency Cindy Carey was informed that low Vitamin D levels contributes to fatigue and are associated with obesity, breast, and colon cancer. She agrees to continue taking 600 calcium with D daily and will follow-up for routine testing of Vitamin D, at least 2-3 times per year. She was informed of the risk of over-replacement of Vitamin D and agrees to not increase her dose unless she discusses this with [name] first. Cindy Carey agrees to follow-up with our clinic in 3 weeks.  Diabetes risk counseling Cindy Carey was given extended (15 minutes) diabetes prevention counseling today. She is a 63 y.o. female and has risk factors for diabetes including obesity. We discussed intensive lifestyle modifications today with an emphasis on weight loss as well as increasing exercise and decreasing simple carbohydrates in her diet.  Obesity Cindy Carey is currently  in the action stage of change. As such, her goal is to continue with weight loss efforts. She has agreed to follow the Pescatarian meal plan. Cindy Carey, RD was present during this visit and then met further with Cindy Carey after the visit to individualize her meal plan.  Cindy Carey has been instructed to continue her exercise  regimen as above. We discussed the following Behavioral Modification Strategies today: increasing lean protein intake, decreasing simple carbohydrates, and planning for success.  Cindy Carey has agreed to follow-up with our clinic in 3 weeks. She was informed of the importance of frequent follow up visits to maximize her success with intensive lifestyle modifications for her multiple health conditions.  ALLERGIES: Allergies  Allergen Reactions  . Bee Venom Anaphylaxis  . Shellfish Allergy Anaphylaxis  . Penicillins Hives    MEDICATIONS: Current Outpatient Medications on File Prior to Visit  Medication Sig Dispense Refill  . ACCU-CHEK FASTCLIX LANCETS MISC USE TO CHECK BLOOD SUGAR TWICE A DAY AS DIRECTED 102 each 3  . ACCU-CHEK GUIDE test strip USE TO CHECK BLOOD SUGAR TWICE A DAY AS DIRECTED 100 each 3  . aspirin EC 81 MG tablet Take 81 mg by mouth daily.    . Calcium Carbonate-Vit D-Min (CALCIUM 600+D PLUS MINERALS) 600-400 MG-UNIT TABS Take 1 tablet by mouth 2 (two) times daily.    . cyclobenzaprine (FLEXERIL) 10 MG tablet Take 1 tablet (10 mg total) by mouth 3 (three) times daily as needed for muscle spasms. 30 tablet 0  . Dulaglutide (TRULICITY) 1.5 ID/7.8EU SOPN INJECT 1.5 MG INTO THE SKIN EVERY 7 DAYS. 6 mL 2  . EPINEPHrine 0.3 mg/0.3 mL IJ SOAJ injection Inject 0.3 mLs (0.3 mg total) into the muscle once as needed. Reported on 04/30/2016 1 Device 0  . lisinopril (PRINIVIL,ZESTRIL) 10 MG tablet Take 1 tablet (10 mg total) by mouth daily. 90 tablet 0  . Magnesium 250 MG TABS Take 250 mg by mouth daily.    [name] METRONIDAZOLE, TOPICAL, 0.75 % LOTN Apply 1 application topically 2 (two) times daily. 59 mL 12  . Multiple Vitamins-Minerals (MULTIVITAMIN PO) Take 1 tablet by mouth daily. Reported on 03/31/2016    . promethazine (PHENERGAN) 25 MG tablet Take 1 tablet (25 mg total) by mouth every 8 (eight) hours as needed for nausea. 30 tablet 0  . simvastatin (ZOCOR) 40 MG tablet Take 1 tablet (40 mg  total) by mouth every evening. 90 tablet 0  . tamoxifen (NOLVADEX) 20 MG tablet Take 1 tablet (20 mg total) by mouth daily. 90 tablet 3   No current facility-administered medications on file prior to visit.     PAST MEDICAL HISTORY: Past Medical History:  Diagnosis Date  . Anemia   . Asthma   . Blood transfusion without reported diagnosis 1983  . Diabetes mellitus without complication ([place]) 2353   T2DM  . Dyspareunia   . Exercise-induced asthma    worse in winter  . H/O bone density study 2013  . H/O cold sores   . H/O colonoscopy 2009  . History of endometriosis   . Hyperlipidemia   . Hypertension   . Injury of tendon of rotator cuff 2015   gym injury (right)  . Knee pain 08/2015   L>R  . Migraine with typical aura   . Mild CAD 04/04/2018   Mild disease on coronary CT-A 01/2018.  . Morbid obesity ([place]) 09/04/12   BMI 49.4 kg/m^2   . Osteoarthritis   . Plantar fasciitis, left 2019  . Trigger  finger of left thumb 08/2018   injected by Dr. Fredna Dow 09/18/18    PAST SURGICAL HISTORY: Past Surgical History:  Procedure Laterality Date  . BREAST BIOPSY  2010  . BREAST LUMPECTOMY  2010  . BREAST LUMPECTOMY WITH RADIOACTIVE SEED LOCALIZATION Left 07/23/2015   Procedure: LEFT BREAST SEED GUIDED EXCISION;  Surgeon: Rolm Bookbinder, MD;  Location: [facility];  Service: General;  Laterality: Left;  . BREATH TEK H PYLORI  08/28/2012   Procedure: BREATH TEK H PYLORI;  Surgeon: Pedro Earls, MD;  Location: [facility];  Service: General;  Laterality: N/A;  . CHOLECYSTECTOMY  2007  . EXPLORATORY LAPAROTOMY  1983   endometriosis  . GANGLION CYST EXCISION Left 1999   Foot  . TOTAL ABDOMINAL HYSTERECTOMY W/ BILATERAL SALPINGOOPHORECTOMY  1984   endometriosis (and appendectomy)    SOCIAL HISTORY: Social History   Tobacco Use  . Smoking status: Never Smoker  . Smokeless tobacco: Never Used  Substance Use Topics  . Alcohol use: No  . Drug use: No    FAMILY  HISTORY: Family History  Problem Relation Age of Onset  . Diabetes Father   . Heart attack Father 82  . Heart disease Father   . Hypertension Father   . Peripheral Artery Disease Father        stents at 77  . Diabetes Paternal Grandfather   . Heart disease Paternal Grandfather   . Osteoporosis Maternal Grandmother   . Heart failure Maternal Grandmother   . Thyroid disease Mother        hypothyroidism  . Heart disease Mother 22       CABG at 71  . Hypertension Mother   . Diverticulitis Mother 41       perforation, required surgery  . Pneumonia Sister   . Cancer Maternal Grandfather        lung  . Dementia Paternal Grandmother   . Cancer Paternal Grandmother        melanoma on her foot (not related to death)  . Breast cancer Paternal Aunt        15's  . Cancer Maternal Aunt        lung  . Cancer Maternal Uncle        lung  . Breast cancer Cousin        mets to lung  . Congestive Heart Failure Maternal Uncle   . Breast cancer Cousin    ROS: Review of Systems  Constitutional: Negative for weight loss.  Endo/Heme/Allergies:       Negative for polyphagia. Negative for hypoglycemia.   PHYSICAL EXAM: Blood pressure 125/81, pulse 74, temperature 97.6 F (36.4 C), temperature source Oral, height 5' 3"  (1.6 m), weight 263 lb (119.3 kg), last menstrual period 10/18/1982, SpO2 97 %. Body mass index is 46.59 kg/m. Physical Exam Vitals signs reviewed.  Constitutional:      Appearance: Normal appearance. She is obese.  Cardiovascular:     Rate and Rhythm: Normal rate.     Pulses: Normal pulses.  Pulmonary:     Effort: Pulmonary effort is normal.     Breath sounds: Normal breath sounds.  Musculoskeletal: Normal range of motion.  Skin:    General: Skin is warm and dry.  Neurological:     Mental Status: She is alert and oriented to person, place, and time.  Psychiatric:        Behavior: Behavior normal.   RECENT LABS AND TESTS: BMET    Component Value Date/Time  NA 140 12/14/2018 0950   NA 139 11/21/2015 0826   K 4.6 12/14/2018 0950   K 4.7 11/21/2015 0826   CL 103 12/14/2018 0950   CO2 22 12/14/2018 0950   CO2 24 11/21/2015 0826   GLUCOSE 105 (H) 12/14/2018 0950   GLUCOSE 96 09/30/2016 0811   GLUCOSE 125 11/21/2015 0826   BUN 9 12/14/2018 0950   BUN 12.5 11/21/2015 0826   CREATININE 0.80 12/14/2018 0950   CREATININE 0.93 09/30/2016 0811   CREATININE 1.0 11/21/2015 0826   CALCIUM 10.4 (H) 12/14/2018 0950   CALCIUM 9.6 11/21/2015 0826   GFRNONAA 80 12/14/2018 0950   GFRAA 92 12/14/2018 0950   Lab Results  Component Value Date   HGBA1C 6.8 (A) 12/14/2018   HGBA1C 7.0 (H) 07/13/2018   HGBA1C 6.0 (A) 04/13/2018   HGBA1C 6.2 12/07/2017   HGBA1C 6.1 01/05/2017   Lab Results  Component Value Date   INSULIN 59.1 (H) 07/13/2018   CBC    Component Value Date/Time   WBC 7.5 12/14/2018 0950   WBC 7.8 09/30/2016 0811   RBC 4.32 12/14/2018 0950   RBC 4.92 09/30/2016 0811   HGB 13.6 12/14/2018 0950   HGB 15.1 11/21/2015 0826   HCT 40.3 12/14/2018 0950   HCT 46.3 11/21/2015 0826   PLT 94 (LL) 12/14/2018 0950   MCV 93 12/14/2018 0950   MCV 95.1 11/21/2015 0826   MCH 31.5 12/14/2018 0950   MCH 32.3 09/30/2016 0811   MCHC 33.7 12/14/2018 0950   MCHC 33.1 09/30/2016 0811   RDW 13.4 12/14/2018 0950   RDW 15.1 (H) 11/21/2015 0826   LYMPHSABS 2.8 12/14/2018 0950   LYMPHSABS 2.6 11/21/2015 0826   MONOABS 546 09/30/2016 0811   MONOABS 0.7 11/21/2015 0826   EOSABS 0.2 12/14/2018 0950   BASOSABS 0.0 12/14/2018 0950   BASOSABS 0.0 11/21/2015 0826   Iron/TIBC/Ferritin/ %Sat    Component Value Date/Time   IRON 84 12/07/2017 1619   Lipid Panel     Component Value Date/Time   CHOL 120 12/14/2018 0950   TRIG 100 12/14/2018 0950   HDL 58 12/14/2018 0950   CHOLHDL 2.1 12/14/2018 0950   CHOLHDL 2.3 09/30/2016 0811   VLDL 26 09/30/2016 0811   LDLCALC 42 12/14/2018 0950   Hepatic Function Panel     Component Value Date/Time   PROT  6.7 12/14/2018 0950   PROT 7.2 11/21/2015 0826   ALBUMIN 4.0 12/14/2018 0950   ALBUMIN 3.8 11/21/2015 0826   AST 56 (H) 12/14/2018 0950   AST 24 11/21/2015 0826   ALT 29 12/14/2018 0950   ALT 24 11/21/2015 0826   ALKPHOS 97 12/14/2018 0950   ALKPHOS 75 11/21/2015 0826   BILITOT 0.4 12/14/2018 0950   BILITOT 0.58 11/21/2015 0826      Component Value Date/Time   TSH 2.240 12/14/2018 0950   TSH 2.200 12/07/2017 1619   TSH 1.79 03/31/2016 0859    Ref. Range 12/14/2018 09:50  Vitamin D, 25-Hydroxy Latest Ref Range: 30.0 - 100.0 ng/mL 48.7   OBESITY BEHAVIORAL INTERVENTION VISIT  Today's visit was #10   Starting weight: 262 lbs Starting date: 07/13/2018 Today's weight: 263 lbs Today's date: 12/20/2018 Total lbs lost to date: 0    12/20/2018  Height 5' 3"  (1.6 m)  Weight 263 lb (119.3 kg)  BMI (Calculated) 46.6  BLOOD PRESSURE - SYSTOLIC 497  BLOOD PRESSURE - DIASTOLIC 81   Body Fat % 53.0 %  Total Body Water (lbs) 88.2 lbs  ASK: We discussed the diagnosis of obesity with Joelyn Oms today and Sayre agreed to give [name] permission to discuss obesity behavioral modification therapy today.  ASSESS: Yicel has the diagnosis of obesity and her BMI today is 46.6. Mc is in the action stage of change.   ADVISE: Mishaal was educated on the multiple health risks of obesity as well as the benefit of weight loss to improve her health. She was advised of the need for long term treatment and the importance of lifestyle modifications to improve her current health and to decrease her risk of future health problems.  AGREE: Multiple dietary modification options and treatment options were discussed and  Jadda agreed to follow the recommendations documented in the above note.  ARRANGE: Gwenyth was educated on the importance of frequent visits to treat obesity as outlined per CMS and USPSTF guidelines and agreed to schedule her next follow up appointment today.  IMichaelene Song, am acting as  Location manager for [employer], FNP-C.  I have reviewed the above documentation for accuracy and completeness, and I agree with the above.  - Lucelia Lacey, FNP-C.

## 2018-12-25 ENCOUNTER — Encounter: Payer: Self-pay | Admitting: Podiatry

## 2018-12-25 ENCOUNTER — Ambulatory Visit (INDEPENDENT_AMBULATORY_CARE_PROVIDER_SITE_OTHER): Payer: No Typology Code available for payment source | Admitting: Podiatry

## 2018-12-25 DIAGNOSIS — M722 Plantar fascial fibromatosis: Secondary | ICD-10-CM

## 2018-12-25 MED ORDER — TRIAMCINOLONE ACETONIDE 10 MG/ML IJ SUSP
10.0000 mg | Freq: Once | INTRAMUSCULAR | Status: AC
  Administered 2018-12-25: 10 mg

## 2018-12-27 NOTE — Progress Notes (Signed)
Subjective:   Patient ID: Cindy Carey, female   DOB: 63 y.o.   MRN: [id number]   HPI Patient states she has been doing pretty well with her heel but she has a spot on the bottom outside that is been sore   ROS      Objective:  Physical Exam  Neurovascular status intact with medial inflammation doing better but quite a bit of discomfort on the central lateral aspect of the plantar fascia     Assessment:  Lateral band plantar fasciitis at the insertion calcaneus     Plan:  H&P condition reviewed and today we will get a do a very careful injection I did explain this may require other treatment options.  I did a sterile prep and from the lateral side I injected the central lateral proximal portion of the fascia and we will reduce activity and see back again in 1 week

## 2018-12-29 ENCOUNTER — Encounter: Payer: Self-pay | Admitting: Family Medicine

## 2019-01-02 MED FILL — TRULICITY 1.5 MG/0.5 ML PEN: 1.5 | 84 days supply | Qty: 6 | Fill #2

## 2019-01-03 ENCOUNTER — Ambulatory Visit: Payer: No Typology Code available for payment source | Admitting: Podiatry

## 2019-01-05 ENCOUNTER — Encounter (INDEPENDENT_AMBULATORY_CARE_PROVIDER_SITE_OTHER): Payer: Self-pay | Admitting: Family Medicine

## 2019-01-09 ENCOUNTER — Encounter (INDEPENDENT_AMBULATORY_CARE_PROVIDER_SITE_OTHER): Payer: Self-pay

## 2019-01-09 ENCOUNTER — Ambulatory Visit (INDEPENDENT_AMBULATORY_CARE_PROVIDER_SITE_OTHER): Payer: No Typology Code available for payment source | Admitting: Family Medicine

## 2019-01-10 ENCOUNTER — Other Ambulatory Visit: Payer: Self-pay | Admitting: Family Medicine

## 2019-01-10 DIAGNOSIS — I10 Essential (primary) hypertension: Secondary | ICD-10-CM

## 2019-01-10 MED FILL — TAMOXIFEN CITRATE 20 MG TAB: 20 | 90 days supply | Qty: 90 | Fill #0

## 2019-01-10 NOTE — Telephone Encounter (Signed)
Patient was seen on 2/12 at 7:15 am and 3/4 at 4:45pm

## 2019-01-11 ENCOUNTER — Encounter (INDEPENDENT_AMBULATORY_CARE_PROVIDER_SITE_OTHER): Payer: Self-pay

## 2019-01-12 ENCOUNTER — Encounter: Payer: Self-pay | Admitting: Family Medicine

## 2019-01-12 DIAGNOSIS — G4700 Insomnia, unspecified: Secondary | ICD-10-CM

## 2019-01-12 MED ORDER — ALPRAZOLAM 0.25 MG PO TABS: 0.2500 mg | ORAL_TABLET | Freq: Every evening | ORAL | 0 refills | Status: DC | PRN

## 2019-01-13 ENCOUNTER — Other Ambulatory Visit (INDEPENDENT_AMBULATORY_CARE_PROVIDER_SITE_OTHER): Payer: Self-pay | Admitting: Family Medicine

## 2019-01-13 DIAGNOSIS — E119 Type 2 diabetes mellitus without complications: Secondary | ICD-10-CM

## 2019-01-17 ENCOUNTER — Other Ambulatory Visit (INDEPENDENT_AMBULATORY_CARE_PROVIDER_SITE_OTHER): Payer: Self-pay | Admitting: Family Medicine

## 2019-01-17 ENCOUNTER — Encounter (INDEPENDENT_AMBULATORY_CARE_PROVIDER_SITE_OTHER): Payer: Self-pay

## 2019-01-17 DIAGNOSIS — E119 Type 2 diabetes mellitus without complications: Secondary | ICD-10-CM

## 2019-01-17 MED FILL — LISINOPRIL 10 MG TABLET: 10 | 90 days supply | Qty: 90 | Fill #0

## 2019-01-24 ENCOUNTER — Other Ambulatory Visit (INDEPENDENT_AMBULATORY_CARE_PROVIDER_SITE_OTHER): Payer: Self-pay | Admitting: Family Medicine

## 2019-01-24 DIAGNOSIS — E119 Type 2 diabetes mellitus without complications: Secondary | ICD-10-CM

## 2019-01-30 MED FILL — MELOXICAM 15 MG TABLET: 15 | 30 days supply | Qty: 30 | Fill #0

## 2019-02-06 ENCOUNTER — Other Ambulatory Visit: Payer: Self-pay

## 2019-02-06 ENCOUNTER — Encounter (INDEPENDENT_AMBULATORY_CARE_PROVIDER_SITE_OTHER): Payer: Self-pay | Admitting: Bariatrics

## 2019-02-06 ENCOUNTER — Ambulatory Visit (INDEPENDENT_AMBULATORY_CARE_PROVIDER_SITE_OTHER): Payer: No Typology Code available for payment source | Admitting: Bariatrics

## 2019-02-06 DIAGNOSIS — E559 Vitamin D deficiency, unspecified: Secondary | ICD-10-CM

## 2019-02-06 DIAGNOSIS — E119 Type 2 diabetes mellitus without complications: Secondary | ICD-10-CM

## 2019-02-06 DIAGNOSIS — Z6841 Body Mass Index (BMI) 40.0 and over, adult: Secondary | ICD-10-CM | POA: Diagnosis not present

## 2019-02-07 NOTE — Progress Notes (Signed)
Office: [phone]  /  Fax: [phone] TeleHealth Visit:  Cindy Carey has verbally consented to this TeleHealth visit today. The patient is located at home, the provider is located at the [employer] [facility] office. The participants in this visit include the listed provider and patient. The visit was conducted today via Web-Ex.  HPI:   Chief Complaint: OBESITY Cindy Carey is here to discuss her progress with her obesity treatment plan. She is on the Pescatarian eating plan +45 grams of carbohydrates per meal and she is following her eating plan approximately 100 % of the time. She states she is walking 25 minutes 7 times per week. [name] thinks that she has stayed the same weight. She has done online shopping. We were unable to weigh the patient today for this TeleHealth visit. She feels as if she has maintained weight since her last visit. She has lost 0 lbs since starting treatment with Cindy Carey.  Diabetes II Cindy Carey has a diagnosis of diabetes type II. Cindy Carey states fasting BGs range between 80 and 90's and 2 hour post prandial BGs range between 130's and 140's. She is taking Metformin and Trulicity (stopped [place]). Chanel denies any hypoglycemic episodes. Last A1c was at 6.8 She has been working on intensive lifestyle modifications including diet, exercise, and weight loss to help control her blood glucose levels.  Vitamin D deficiency Cindy Carey has a diagnosis of vitamin D deficiency. Her last vitamin D level was at 48.7 Cindy Carey is currently taking calcium and vit D and she denies nausea, vomiting or muscle weakness.  ASSESSMENT AND PLAN:  Type 2 diabetes mellitus without complication, without long-term current use of insulin (HCC)  Vitamin D deficiency  Class 3 severe obesity with serious comorbidity and body mass index (BMI) of 45.0 to 49.9 in adult, unspecified obesity type ([place])  PLAN:  Diabetes II Cindy Carey has been given extensive diabetes education by myself today including ideal fasting  and post-prandial blood glucose readings, individual ideal Hgb A1c goals and hypoglycemia prevention. We discussed the importance of good blood sugar control to decrease the likelihood of diabetic complications such as nephropathy, neuropathy, limb loss, blindness, coronary artery disease, and death. We discussed the importance of intensive lifestyle modification including diet, exercise and weight loss as the first line treatment for diabetes. Sande will continue her diabetes medications and will follow up at the agreed upon time.  Vitamin D Deficiency [name] was informed that low vitamin D levels contributes to fatigue and are associated with obesity, breast, and colon cancer. She will continue calcium and vitamin D and will follow up for routine testing of vitamin D, at least 2-3 times per year. She was informed of the risk of over-replacement of vitamin D and agrees to not increase her dose unless she discusses this with Cindy Carey first.  Obesity Aubriee is currently in the action stage of change. As such, her goal is to continue with weight loss efforts She has agreed to follow the Pescatarian eating plan +45 grams of carbohydrates per meal Cindy Carey will continue exercise for weight loss and overall health benefits. We discussed the following Behavioral Modification Strategies today: increase H2O intake, keeping healthy foods in the home, increasing lean protein intake, decreasing simple carbohydrates, increasing vegetables, decrease eating out and work on meal planning and easy cooking plans Cindy Carey will continue to cook and be creative.  Cindy Carey has agreed to follow up with our clinic in 2 weeks. She was informed of the importance of frequent follow up visits to maximize  her success with intensive lifestyle modifications for her multiple health conditions.  ALLERGIES: Allergies  Allergen Reactions   Bee Venom Anaphylaxis   Shellfish Allergy Anaphylaxis   Penicillins Hives    MEDICATIONS: Current  Outpatient Medications on File Prior to Visit  Medication Sig Dispense Refill   ACCU-CHEK FASTCLIX LANCETS MISC USE TO CHECK BLOOD SUGAR TWICE A DAY AS DIRECTED 102 each 3   ACCU-CHEK GUIDE test strip USE TO CHECK BLOOD SUGAR TWICE A DAY AS DIRECTED 100 each 3   ALPRAZolam (XANAX) 0.25 MG tablet Take 1-2 tablets (0.25-0.5 mg total) by mouth at bedtime as needed for anxiety or sleep. 15 tablet 0   aspirin EC 81 MG tablet Take 81 mg by mouth daily.     Calcium Carbonate-Vit D-Min (CALCIUM 600+D PLUS MINERALS) 600-400 MG-UNIT TABS Take 1 tablet by mouth 2 (two) times daily.     cyclobenzaprine (FLEXERIL) 10 MG tablet Take 1 tablet (10 mg total) by mouth 3 (three) times daily as needed for muscle spasms. 30 tablet 0   dapagliflozin propanediol (FARXIGA) 5 MG TABS tablet Take 5 mg by mouth daily. 30 tablet 0   Dulaglutide (TRULICITY) 1.5 ZC/5.8IF SOPN INJECT 1.5 MG INTO THE SKIN EVERY 7 DAYS. 6 mL 2   EPINEPHrine 0.3 mg/0.3 mL IJ SOAJ injection Inject 0.3 mLs (0.3 mg total) into the muscle once as needed. Reported on 04/30/2016 1 Device 0   lisinopril (PRINIVIL,ZESTRIL) 10 MG tablet TAKE 1 TABLET (10 MG TOTAL) BY MOUTH DAILY. 90 tablet 0   Magnesium 250 MG TABS Take 250 mg by mouth daily.     metFORMIN (GLUCOPHAGE-XR) 500 MG 24 hr tablet Take 2 tablets (1,000 mg total) by mouth 2 (two) times daily. 360 tablet 1   METRONIDAZOLE, TOPICAL, 0.75 % LOTN Apply 1 application topically 2 (two) times daily. 59 mL 12   Multiple Vitamins-Minerals (MULTIVITAMIN PO) Take 1 tablet by mouth daily. Reported on 03/31/2016     promethazine (PHENERGAN) 25 MG tablet Take 1 tablet (25 mg total) by mouth every 8 (eight) hours as needed for nausea. 30 tablet 0   simvastatin (ZOCOR) 40 MG tablet Take 1 tablet (40 mg total) by mouth every evening. 90 tablet 0   tamoxifen (NOLVADEX) 20 MG tablet Take 1 tablet (20 mg total) by mouth daily. 90 tablet 3   No current facility-administered medications on file prior  to visit.     PAST MEDICAL HISTORY: Past Medical History:  Diagnosis Date   Anemia    Asthma    Blood transfusion without reported diagnosis 1983   Diabetes mellitus without complication ([place]) 0277   T2DM   Dyspareunia    Exercise-induced asthma    worse in winter   H/O bone density study 2013   H/O cold sores    H/O colonoscopy 2009   History of endometriosis    Hyperlipidemia    Hypertension    Injury of tendon of rotator cuff 2015   gym injury (right)   Knee pain 08/2015   L>R   Migraine with typical aura    Mild CAD 04/04/2018   Mild disease on coronary CT-A 01/2018.   Morbid obesity ([place]) 09/04/12   BMI 49.4 kg/m^2    Osteoarthritis    Plantar fasciitis, left 2019   Trigger finger of left thumb 08/2018   injected by Dr. Fredna Dow 09/18/18    PAST SURGICAL HISTORY: Past Surgical History:  Procedure Laterality Date   BREAST BIOPSY  2010   BREAST LUMPECTOMY  2010   BREAST LUMPECTOMY WITH RADIOACTIVE SEED LOCALIZATION Left 07/23/2015   Procedure: LEFT BREAST SEED GUIDED EXCISION;  Surgeon: Rolm Bookbinder, MD;  Location: [facility];  Service: General;  Laterality: Left;   BREATH TEK H PYLORI  08/28/2012   Procedure: BREATH TEK H PYLORI;  Surgeon: Pedro Earls, MD;  Location: [facility];  Service: General;  Laterality: N/A;   CHOLECYSTECTOMY  2007   EXPLORATORY LAPAROTOMY  1983   endometriosis   GANGLION CYST EXCISION Left 1999   Foot   TOTAL ABDOMINAL HYSTERECTOMY W/ BILATERAL SALPINGOOPHORECTOMY  1984   endometriosis (and appendectomy)    SOCIAL HISTORY: Social History   Tobacco Use   Smoking status: Never Smoker   Smokeless tobacco: Never Used  Substance Use Topics   Alcohol use: No   Drug use: No    FAMILY HISTORY: Family History  Problem Relation Age of Onset   Diabetes Father    Heart attack Father 1   Heart disease Father    Hypertension Father    Peripheral Artery Disease Father         stents at 25   Diabetes Paternal Grandfather    Heart disease Paternal Grandfather    Osteoporosis Maternal Grandmother    Heart failure Maternal Grandmother    Thyroid disease Mother        hypothyroidism   Heart disease Mother 45       CABG at 1   Hypertension Mother    Diverticulitis Mother 77       perforation, required surgery   Pneumonia Sister    Cancer Maternal Grandfather        lung   Dementia Paternal Grandmother    Cancer Paternal Grandmother        melanoma on her foot (not related to death)   Breast cancer Paternal Aunt        66's   Cancer Maternal Aunt        lung   Cancer Maternal Uncle        lung   Breast cancer Cousin        mets to lung   Congestive Heart Failure Maternal Uncle    Breast cancer Cousin     ROS: Review of Systems  Gastrointestinal: Negative for nausea and vomiting.  Musculoskeletal:       Negative for muscle weakness  Endo/Heme/Allergies:       Negative for hypoglycemia    PHYSICAL EXAM: Pt in no acute distress  RECENT LABS AND TESTS: BMET    Component Value Date/Time   NA 140 12/14/2018 0950   NA 139 11/21/2015 0826   K 4.6 12/14/2018 0950   K 4.7 11/21/2015 0826   CL 103 12/14/2018 0950   CO2 22 12/14/2018 0950   CO2 24 11/21/2015 0826   GLUCOSE 105 (H) 12/14/2018 0950   GLUCOSE 96 09/30/2016 0811   GLUCOSE 125 11/21/2015 0826   BUN 9 12/14/2018 0950   BUN 12.5 11/21/2015 0826   CREATININE 0.80 12/14/2018 0950   CREATININE 0.93 09/30/2016 0811   CREATININE 1.0 11/21/2015 0826   CALCIUM 10.4 (H) 12/14/2018 0950   CALCIUM 9.6 11/21/2015 0826   GFRNONAA 80 12/14/2018 0950   GFRAA 92 12/14/2018 0950   Lab Results  Component Value Date   HGBA1C 6.8 (A) 12/14/2018   HGBA1C 7.0 (H) 07/13/2018   HGBA1C 6.0 (A) 04/13/2018   HGBA1C 6.2 12/07/2017   HGBA1C 6.1 01/05/2017   Lab Results  Component Value Date  INSULIN 59.1 (H) 07/13/2018   CBC    Component Value Date/Time   WBC 7.5 12/14/2018  0950   WBC 7.8 09/30/2016 0811   RBC 4.32 12/14/2018 0950   RBC 4.92 09/30/2016 0811   HGB 13.6 12/14/2018 0950   HGB 15.1 11/21/2015 0826   HCT 40.3 12/14/2018 0950   HCT 46.3 11/21/2015 0826   PLT 94 (LL) 12/14/2018 0950   MCV 93 12/14/2018 0950   MCV 95.1 11/21/2015 0826   MCH 31.5 12/14/2018 0950   MCH 32.3 09/30/2016 0811   MCHC 33.7 12/14/2018 0950   MCHC 33.1 09/30/2016 0811   RDW 13.4 12/14/2018 0950   RDW 15.1 (H) 11/21/2015 0826   LYMPHSABS 2.8 12/14/2018 0950   LYMPHSABS 2.6 11/21/2015 0826   MONOABS 546 09/30/2016 0811   MONOABS 0.7 11/21/2015 0826   EOSABS 0.2 12/14/2018 0950   BASOSABS 0.0 12/14/2018 0950   BASOSABS 0.0 11/21/2015 0826   Iron/TIBC/Ferritin/ %Sat    Component Value Date/Time   IRON 84 12/07/2017 1619   Lipid Panel     Component Value Date/Time   CHOL 120 12/14/2018 0950   TRIG 100 12/14/2018 0950   HDL 58 12/14/2018 0950   CHOLHDL 2.1 12/14/2018 0950   CHOLHDL 2.3 09/30/2016 0811   VLDL 26 09/30/2016 0811   LDLCALC 42 12/14/2018 0950   Hepatic Function Panel     Component Value Date/Time   PROT 6.7 12/14/2018 0950   PROT 7.2 11/21/2015 0826   ALBUMIN 4.0 12/14/2018 0950   ALBUMIN 3.8 11/21/2015 0826   AST 56 (H) 12/14/2018 0950   AST 24 11/21/2015 0826   ALT 29 12/14/2018 0950   ALT 24 11/21/2015 0826   ALKPHOS 97 12/14/2018 0950   ALKPHOS 75 11/21/2015 0826   BILITOT 0.4 12/14/2018 0950   BILITOT 0.58 11/21/2015 0826      Component Value Date/Time   TSH 2.240 12/14/2018 0950   TSH 2.200 12/07/2017 1619   TSH 1.79 03/31/2016 0859     Ref. Range 12/14/2018 09:50  Vitamin D, 25-Hydroxy Latest Ref Range: 30.0 - 100.0 ng/mL 48.7    I, Doreene Nest, am acting as Location manager for [employer]. Owens Shark, DO  I have reviewed the above documentation for accuracy and completeness, and I agree with the above. -Jearld Lesch, DO

## 2019-02-19 ENCOUNTER — Telehealth: Payer: Self-pay | Admitting: Hematology and Oncology

## 2019-02-19 NOTE — Telephone Encounter (Signed)
Returned patient's phone call regarding Cancelling an appointment, per patient's request appointment on 05/15 has been cancelled.Patient will call back when ready to reschedule.   Message to provider.

## 2019-02-20 ENCOUNTER — Other Ambulatory Visit: Payer: Self-pay

## 2019-02-20 ENCOUNTER — Encounter (INDEPENDENT_AMBULATORY_CARE_PROVIDER_SITE_OTHER): Payer: Self-pay | Admitting: Bariatrics

## 2019-02-20 ENCOUNTER — Ambulatory Visit (INDEPENDENT_AMBULATORY_CARE_PROVIDER_SITE_OTHER): Payer: No Typology Code available for payment source | Admitting: Bariatrics

## 2019-02-20 DIAGNOSIS — Z6841 Body Mass Index (BMI) 40.0 and over, adult: Secondary | ICD-10-CM

## 2019-02-20 DIAGNOSIS — E119 Type 2 diabetes mellitus without complications: Secondary | ICD-10-CM

## 2019-02-20 DIAGNOSIS — E559 Vitamin D deficiency, unspecified: Secondary | ICD-10-CM

## 2019-02-22 NOTE — Progress Notes (Signed)
Office: [phone]  /  Fax: [phone] TeleHealth Visit:  Cindy Carey has verbally consented to this TeleHealth visit today. The patient is located at home, the provider is located at the [employer] [facility] office. The participants in this visit include the listed provider and patient and any and all parties involved. The visit was conducted today via WebEx.  HPI:   Chief Complaint: OBESITY Cindy Carey is here to discuss her progress with her obesity treatment plan. She was instructed to follow the Pescatarian eating plan, however she is on the Counting Carbs plan. She states she is walking for 30 minutes 7 times per week. Cindy Carey states that she has stayed the same (weight 260 lbs). She is struggling with boredom eating. We were unable to weigh the patient today for this TeleHealth visit. She feels as if she has maintained weight since her last visit. She has lost 0 lbs since starting treatment with Cindy Carey.  Diabetes II [name] has a diagnosis of diabetes type II. She is taking Dulaglutide and Metformin. Markan states fasting BGs range between 90 and 100 and 2 hour post prandial BGs range between 120 and 130 and she denies any hypoglycemic episodes. Last A1c was at 6.8 She has been working on intensive lifestyle modifications including diet, exercise, and weight loss to help control her blood glucose levels.  Vitamin D deficiency [name] has a diagnosis of vitamin D deficiency. She is currently taking OTC vit D and denies nausea, vomiting or muscle weakness.  ASSESSMENT AND PLAN:  Type 2 diabetes mellitus without complication, without long-term current use of insulin (HCC)  Vitamin D deficiency  Class 3 severe obesity with serious comorbidity and body mass index (BMI) of 45.0 to 49.9 in adult, unspecified obesity type ([place])  PLAN:  Diabetes II Cindy Carey has been given extensive diabetes education by myself today including ideal fasting and post-prandial blood glucose readings, individual  ideal Hgb A1c goals and hypoglycemia prevention. We discussed the importance of good blood sugar control to decrease the likelihood of diabetic complications such as nephropathy, neuropathy, limb loss, blindness, coronary artery disease, and death. We discussed the importance of intensive lifestyle modification including diet, exercise and weight loss as the first line treatment for diabetes. Joyel agrees to continue her diabetes medications and continue to monitor her fasting blood sugars and 2 hour post prandial blood sugars. Audreana agreed to follow up at the agreed upon time.  Vitamin D Deficiency Cindy Carey was informed that low vitamin D levels contributes to fatigue and are associated with obesity, breast, and colon cancer. She will continue to take OTC vitamin D and will follow up for routine testing of vitamin D, at least 2-3 times per year. She was informed of the risk of over-replacement of vitamin D and agrees to not increase her dose unless she discusses this with Cindy Carey first.  Obesity Cindy Carey is currently in the action stage of change. As such, her goal is to continue with weight loss efforts She has agreed to begin following the lower carbohydrate, vegetable and lean protein rich diet plan Cindy Carey will continue exercise (walking) for weight loss and overall health benefits. We discussed the following Behavioral Modification Strategies today: planning for success, increase H2O intake, no skipping meals, keeping healthy foods in the home, increasing lean protein intake, decreasing simple carbohydrates, increasing vegetables, decrease eating out and work on meal planning and easy cooking plans Cindy Carey will weigh herself at home before each visit. Cindy Carey will do journaling on paper.   Cindy Carey [place]  has agreed to follow up with our clinic in 2 weeks. She was informed of the importance of frequent follow up visits to maximize her success with intensive lifestyle modifications for her multiple health  conditions.  ALLERGIES: Allergies  Allergen Reactions   Bee Venom Anaphylaxis   Shellfish Allergy Anaphylaxis   Penicillins Hives    MEDICATIONS: Current Outpatient Medications on File Prior to Visit  Medication Sig Dispense Refill   ACCU-CHEK FASTCLIX LANCETS MISC USE TO CHECK BLOOD SUGAR TWICE A DAY AS DIRECTED 102 each 3   ACCU-CHEK GUIDE test strip USE TO CHECK BLOOD SUGAR TWICE A DAY AS DIRECTED 100 each 3   ALPRAZolam (XANAX) 0.25 MG tablet Take 1-2 tablets (0.25-0.5 mg total) by mouth at bedtime as needed for anxiety or sleep. 15 tablet 0   aspirin EC 81 MG tablet Take 81 mg by mouth daily.     Calcium Carbonate-Vit D-Min (CALCIUM 600+D PLUS MINERALS) 600-400 MG-UNIT TABS Take 1 tablet by mouth 2 (two) times daily.     cyclobenzaprine (FLEXERIL) 10 MG tablet Take 1 tablet (10 mg total) by mouth 3 (three) times daily as needed for muscle spasms. 30 tablet 0   Dulaglutide (TRULICITY) 1.5 AS/5.0NL SOPN INJECT 1.5 MG INTO THE SKIN EVERY 7 DAYS. 6 mL 2   EPINEPHrine 0.3 mg/0.3 mL IJ SOAJ injection Inject 0.3 mLs (0.3 mg total) into the muscle once as needed. Reported on 04/30/2016 1 Device 0   lisinopril (PRINIVIL,ZESTRIL) 10 MG tablet TAKE 1 TABLET (10 MG TOTAL) BY MOUTH DAILY. 90 tablet 0   Magnesium 250 MG TABS Take 250 mg by mouth daily.     metFORMIN (GLUCOPHAGE-XR) 500 MG 24 hr tablet Take 2 tablets (1,000 mg total) by mouth 2 (two) times daily. 360 tablet 1   METRONIDAZOLE, TOPICAL, 0.75 % LOTN Apply 1 application topically 2 (two) times daily. 59 mL 12   Multiple Vitamins-Minerals (MULTIVITAMIN PO) Take 1 tablet by mouth daily. Reported on 03/31/2016     promethazine (PHENERGAN) 25 MG tablet Take 1 tablet (25 mg total) by mouth every 8 (eight) hours as needed for nausea. 30 tablet 0   simvastatin (ZOCOR) 40 MG tablet Take 1 tablet (40 mg total) by mouth every evening. 90 tablet 0   tamoxifen (NOLVADEX) 20 MG tablet Take 1 tablet (20 mg total) by mouth daily. 90  tablet 3   No current facility-administered medications on file prior to visit.     PAST MEDICAL HISTORY: Past Medical History:  Diagnosis Date   Anemia    Asthma    Blood transfusion without reported diagnosis 1983   Diabetes mellitus without complication ([place]) 9767   T2DM   Dyspareunia    Exercise-induced asthma    worse in winter   H/O bone density study 2013   H/O cold sores    H/O colonoscopy 2009   History of endometriosis    Hyperlipidemia    Hypertension    Injury of tendon of rotator cuff 2015   gym injury (right)   Knee pain 08/2015   L>R   Migraine with typical aura    Mild CAD 04/04/2018   Mild disease on coronary CT-A 01/2018.   Morbid obesity ([place]) 09/04/12   BMI 49.4 kg/m^2    Osteoarthritis    Plantar fasciitis, left 2019   Trigger finger of left thumb 08/2018   injected by Dr. Fredna Dow 09/18/18    PAST SURGICAL HISTORY: Past Surgical History:  Procedure Laterality Date   BREAST BIOPSY  2010   BREAST LUMPECTOMY  2010   BREAST LUMPECTOMY WITH RADIOACTIVE SEED LOCALIZATION Left 07/23/2015   Procedure: LEFT BREAST SEED GUIDED EXCISION;  Surgeon: Rolm Bookbinder, MD;  Location: [facility];  Service: General;  Laterality: Left;   BREATH TEK H PYLORI  08/28/2012   Procedure: BREATH TEK H PYLORI;  Surgeon: Pedro Earls, MD;  Location: [facility];  Service: General;  Laterality: N/A;   CHOLECYSTECTOMY  2007   EXPLORATORY LAPAROTOMY  1983   endometriosis   GANGLION CYST EXCISION Left 1999   Foot   TOTAL ABDOMINAL HYSTERECTOMY W/ BILATERAL SALPINGOOPHORECTOMY  1984   endometriosis (and appendectomy)    SOCIAL HISTORY: Social History   Tobacco Use   Smoking status: Never Smoker   Smokeless tobacco: Never Used  Substance Use Topics   Alcohol use: No   Drug use: No    FAMILY HISTORY: Family History  Problem Relation Age of Onset   Diabetes Father    Heart attack Father 100   Heart disease  Father    Hypertension Father    Peripheral Artery Disease Father        stents at 30   Diabetes Paternal Grandfather    Heart disease Paternal Grandfather    Osteoporosis Maternal Grandmother    Heart failure Maternal Grandmother    Thyroid disease Mother        hypothyroidism   Heart disease Mother 19       CABG at 13   Hypertension Mother    Diverticulitis Mother 22       perforation, required surgery   Pneumonia Sister    Cancer Maternal Grandfather        lung   Dementia Paternal Grandmother    Cancer Paternal Grandmother        melanoma on her foot (not related to death)   Breast cancer Paternal Aunt        49's   Cancer Maternal Aunt        lung   Cancer Maternal Uncle        lung   Breast cancer Cousin        mets to lung   Congestive Heart Failure Maternal Uncle    Breast cancer Cousin     ROS: Review of Systems  Constitutional: Negative for weight loss.  Gastrointestinal: Negative for nausea and vomiting.  Musculoskeletal:       Negative for muscle weakness  Endo/Heme/Allergies:       Negative for hypoglycemia    PHYSICAL EXAM: Pt in no acute distress  RECENT LABS AND TESTS: BMET    Component Value Date/Time   NA 140 12/14/2018 0950   NA 139 11/21/2015 0826   K 4.6 12/14/2018 0950   K 4.7 11/21/2015 0826   CL 103 12/14/2018 0950   CO2 22 12/14/2018 0950   CO2 24 11/21/2015 0826   GLUCOSE 105 (H) 12/14/2018 0950   GLUCOSE 96 09/30/2016 0811   GLUCOSE 125 11/21/2015 0826   BUN 9 12/14/2018 0950   BUN 12.5 11/21/2015 0826   CREATININE 0.80 12/14/2018 0950   CREATININE 0.93 09/30/2016 0811   CREATININE 1.0 11/21/2015 0826   CALCIUM 10.4 (H) 12/14/2018 0950   CALCIUM 9.6 11/21/2015 0826   GFRNONAA 80 12/14/2018 0950   GFRAA 92 12/14/2018 0950   Lab Results  Component Value Date   HGBA1C 6.8 (A) 12/14/2018   HGBA1C 7.0 (H) 07/13/2018   HGBA1C 6.0 (A) 04/13/2018   HGBA1C 6.2 12/07/2017  HGBA1C 6.1 01/05/2017   Lab  Results  Component Value Date   INSULIN 59.1 (H) 07/13/2018   CBC    Component Value Date/Time   WBC 7.5 12/14/2018 0950   WBC 7.8 09/30/2016 0811   RBC 4.32 12/14/2018 0950   RBC 4.92 09/30/2016 0811   HGB 13.6 12/14/2018 0950   HGB 15.1 11/21/2015 0826   HCT 40.3 12/14/2018 0950   HCT 46.3 11/21/2015 0826   PLT 94 (LL) 12/14/2018 0950   MCV 93 12/14/2018 0950   MCV 95.1 11/21/2015 0826   MCH 31.5 12/14/2018 0950   MCH 32.3 09/30/2016 0811   MCHC 33.7 12/14/2018 0950   MCHC 33.1 09/30/2016 0811   RDW 13.4 12/14/2018 0950   RDW 15.1 (H) 11/21/2015 0826   LYMPHSABS 2.8 12/14/2018 0950   LYMPHSABS 2.6 11/21/2015 0826   MONOABS 546 09/30/2016 0811   MONOABS 0.7 11/21/2015 0826   EOSABS 0.2 12/14/2018 0950   BASOSABS 0.0 12/14/2018 0950   BASOSABS 0.0 11/21/2015 0826   Iron/TIBC/Ferritin/ %Sat    Component Value Date/Time   IRON 84 12/07/2017 1619   Lipid Panel     Component Value Date/Time   CHOL 120 12/14/2018 0950   TRIG 100 12/14/2018 0950   HDL 58 12/14/2018 0950   CHOLHDL 2.1 12/14/2018 0950   CHOLHDL 2.3 09/30/2016 0811   VLDL 26 09/30/2016 0811   LDLCALC 42 12/14/2018 0950   Hepatic Function Panel     Component Value Date/Time   PROT 6.7 12/14/2018 0950   PROT 7.2 11/21/2015 0826   ALBUMIN 4.0 12/14/2018 0950   ALBUMIN 3.8 11/21/2015 0826   AST 56 (H) 12/14/2018 0950   AST 24 11/21/2015 0826   ALT 29 12/14/2018 0950   ALT 24 11/21/2015 0826   ALKPHOS 97 12/14/2018 0950   ALKPHOS 75 11/21/2015 0826   BILITOT 0.4 12/14/2018 0950   BILITOT 0.58 11/21/2015 0826      Component Value Date/Time   TSH 2.240 12/14/2018 0950   TSH 2.200 12/07/2017 1619   TSH 1.79 03/31/2016 0859     Ref. Range 12/14/2018 09:50  Vitamin D, 25-Hydroxy Latest Ref Range: 30.0 - 100.0 ng/mL 48.7     I, Doreene Nest, am acting as Location manager for [employer]. Owens Shark, DO  I have reviewed the above documentation for accuracy and completeness, and I agree with the above.  -Jearld Lesch, DO

## 2019-02-23 ENCOUNTER — Encounter: Payer: Self-pay | Admitting: Family Medicine

## 2019-03-02 ENCOUNTER — Ambulatory Visit: Payer: Self-pay | Admitting: Hematology and Oncology

## 2019-03-05 ENCOUNTER — Encounter: Payer: Self-pay | Admitting: Family Medicine

## 2019-03-07 ENCOUNTER — Other Ambulatory Visit: Payer: Self-pay

## 2019-03-07 ENCOUNTER — Encounter (INDEPENDENT_AMBULATORY_CARE_PROVIDER_SITE_OTHER): Payer: Self-pay | Admitting: Bariatrics

## 2019-03-07 ENCOUNTER — Ambulatory Visit (INDEPENDENT_AMBULATORY_CARE_PROVIDER_SITE_OTHER): Payer: No Typology Code available for payment source | Admitting: Bariatrics

## 2019-03-07 DIAGNOSIS — E119 Type 2 diabetes mellitus without complications: Secondary | ICD-10-CM

## 2019-03-07 DIAGNOSIS — Z6841 Body Mass Index (BMI) 40.0 and over, adult: Secondary | ICD-10-CM

## 2019-03-07 DIAGNOSIS — E559 Vitamin D deficiency, unspecified: Secondary | ICD-10-CM

## 2019-03-08 ENCOUNTER — Other Ambulatory Visit (INDEPENDENT_AMBULATORY_CARE_PROVIDER_SITE_OTHER): Payer: Self-pay

## 2019-03-08 ENCOUNTER — Telehealth (INDEPENDENT_AMBULATORY_CARE_PROVIDER_SITE_OTHER): Payer: Self-pay | Admitting: Bariatrics

## 2019-03-08 DIAGNOSIS — E119 Type 2 diabetes mellitus without complications: Secondary | ICD-10-CM

## 2019-03-08 MED ORDER — EMPAGLIFLOZIN 10 MG PO TABS: 10.0000 mg | ORAL_TABLET | Freq: Every day | ORAL | 0 refills | Status: DC

## 2019-03-08 NOTE — Telephone Encounter (Signed)
Please advise 

## 2019-03-08 NOTE — Telephone Encounter (Signed)
Talked to the patient. I discussed diabetic medications ( Onglyza and Jardiance ). Her PCP had prescribed Farxiga but patient thought blood sugars were higher. She had itching with Invokana, but no rash or SOB. Will begin Jardiance.

## 2019-03-09 MED FILL — JARDIANCE 10 MG TABLET: 10 | 30 days supply | Qty: 30 | Fill #0

## 2019-03-11 NOTE — Progress Notes (Addendum)
Virtual Visit via Video Note  I connected with Martha Ellerby Farace on 03/11/19 at  3:40 PM EDT by a video enabled telemedicine application( Web-Ex ) and verified that I am speaking with the correct person using two identifiers.  Location: Patient: Work Provider: In office    I discussed the limitations of evaluation and management by telemedicine and the availability of in person appointments. The patient expressed understanding and agreed to proceed.  History of Present Illness: She states that her blood sugars have been higher than normal. She states that she has lost 2 lbs ( wt. 258 ).   Diabetes II Shriya has a diagnosis of diabetes type II. Clessie states BGs range between 120 to 140  and her 2 hr PP are 170 to 190  and denies any hypoglycemic episodes. She states that these values are higher than normal and she is concerned.   Hemoglobin A1C Latest Ref Rng & Units 12/14/2018 07/13/2018 04/13/2018  HGBA1C 4.0 - 5.6 % 6.8(A) 7.0(H) 6.0(A)  Some recent data might be hidden    She has been working on intensive lifestyle modifications including diet, exercise, and weight loss to help control her blood glucose levels.    Vitamin D deficiency Joyelle has a diagnosis of vitamin D deficiency. She is currently taking vit D and denies nausea, vomiting or muscle weakness.   Obesity  She states that she has lost 2 lbs. She is following the low carbohydrate plan 100 % of the time per patient.   Assessment and Plan:  Diabetes II Dama has been given extensive diabetes education by myself today including ideal fasting and post-prandial blood glucose readings, individual ideal HgA1c goals  and hypoglycemia prevention. We discussed the importance of good blood sugar control to decrease the likelihood of diabetic complications such as nephropathy, neuropathy, limb loss, blindness, coronary artery disease, and death. We discussed the importance of intensive lifestyle modification including diet, exercise and  weight loss as the first line treatment for diabetes. Sharetha agrees to continue her diabetes medications and will follow up at the agreed upon time. I am going to add Jardiance to her medication regimen.  Vitamin D Deficiency Ayslin was informed that low vitamin D levels contributes to fatigue and are associated with obesity, breast, and colon cancer. She agrees to continue to take vitamin D OTC.[name]  She was informed of the risk of over-replacement of vitamin D and agrees to not increase her dose unless she discusses this with [name] first.  Obesity Janeil is currently in the action stage of change. As such, her goal is to continue weight loss.  She has agreed to continue the low carbohydrate diet .  Sharyn has been instructed to work up to a goal of 150 minutes of combined cardio and strengthening exercise per week for weight loss and overall health benefits. We discussed the following Behavioral Modification Stratagies today: meal planning, and decreasing her carbohydrates.    Follow Up Instructions:  I discussed the assessment and treatment plan with the patient. The patient was provided an opportunity to ask questions and all were answered. The patient agreed with the plan and demonstrated an understanding of the instructions.   The patient was advised to call back or seek an in-person evaluation if the symptoms worsen or if the condition fails to improve as anticipated.  I provided 22 minutes of non-face-to-face time during this encounter.  F/U in 2 weeks.    Jearld Lesch, DO

## 2019-03-15 ENCOUNTER — Other Ambulatory Visit: Payer: Self-pay | Admitting: Family Medicine

## 2019-03-15 ENCOUNTER — Encounter: Payer: Self-pay | Admitting: Family Medicine

## 2019-03-15 MED ORDER — GLUCOSE BLOOD VI STRP: ORAL_STRIP | 3 refills | Status: DC

## 2019-03-15 MED ORDER — CYCLOBENZAPRINE HCL 10 MG PO TABS: 10.0000 mg | ORAL_TABLET | Freq: Three times a day (TID) | ORAL | 0 refills | Status: DC | PRN

## 2019-03-15 MED ORDER — ACCU-CHEK FASTCLIX LANCETS MISC: 3 refills | Status: DC

## 2019-03-15 MED FILL — metFORMIN HCL ER 500 MG TB2: 500 | 90 days supply | Qty: 360 | Fill #0

## 2019-03-15 MED FILL — ACCU-CHEK FASTCLIX LANCETS: 51 days supply | Qty: 102 | Fill #0

## 2019-03-15 MED FILL — CYCLOBENZAPRINE HCL 10 MG T: 10 | 10 days supply | Qty: 30 | Fill #0

## 2019-03-15 NOTE — Telephone Encounter (Signed)
Is this okay to refill? 

## 2019-03-15 NOTE — Telephone Encounter (Signed)
Last filled #30 04/2018.  Ok (done)

## 2019-03-21 ENCOUNTER — Ambulatory Visit (INDEPENDENT_AMBULATORY_CARE_PROVIDER_SITE_OTHER): Payer: No Typology Code available for payment source | Admitting: Bariatrics

## 2019-03-21 ENCOUNTER — Encounter (INDEPENDENT_AMBULATORY_CARE_PROVIDER_SITE_OTHER): Payer: Self-pay | Admitting: Bariatrics

## 2019-03-21 ENCOUNTER — Other Ambulatory Visit: Payer: Self-pay

## 2019-03-21 DIAGNOSIS — I10 Essential (primary) hypertension: Secondary | ICD-10-CM | POA: Diagnosis not present

## 2019-03-21 DIAGNOSIS — E119 Type 2 diabetes mellitus without complications: Secondary | ICD-10-CM | POA: Diagnosis not present

## 2019-03-21 DIAGNOSIS — Z6841 Body Mass Index (BMI) 40.0 and over, adult: Secondary | ICD-10-CM | POA: Diagnosis not present

## 2019-03-21 NOTE — Telephone Encounter (Signed)
Please review

## 2019-03-22 ENCOUNTER — Telehealth: Payer: Self-pay | Admitting: Family Medicine

## 2019-03-22 ENCOUNTER — Other Ambulatory Visit: Payer: Self-pay | Admitting: Family Medicine

## 2019-03-22 ENCOUNTER — Encounter: Payer: Self-pay | Admitting: Family Medicine

## 2019-03-22 ENCOUNTER — Other Ambulatory Visit: Payer: Self-pay | Admitting: *Deleted

## 2019-03-22 DIAGNOSIS — E559 Vitamin D deficiency, unspecified: Secondary | ICD-10-CM

## 2019-03-22 DIAGNOSIS — E119 Type 2 diabetes mellitus without complications: Secondary | ICD-10-CM

## 2019-03-22 DIAGNOSIS — D696 Thrombocytopenia, unspecified: Secondary | ICD-10-CM

## 2019-03-22 MED FILL — TRULICITY 1.5 MG/0.5 ML PEN: 1.5 | 84 days supply | Qty: 6 | Fill #0

## 2019-03-22 NOTE — Telephone Encounter (Signed)
Fine with me--not sure if we can release their orders or if needs to be re-ordered?

## 2019-03-22 NOTE — Progress Notes (Signed)
Office: [phone]  /  Fax: [phone] TeleHealth Visit:  Cindy Carey has verbally consented to this TeleHealth visit today. The patient is located at home, the provider is located at the [facility] office. The participants in this visit include the listed provider and patient and any and all parties involved. The visit was conducted today via WebEx.  HPI:   Chief Complaint: OBESITY Cindy Carey is here to discuss her progress with her obesity treatment plan. She is on the Pescatarian eating plan and is following her eating plan approximately 90 % of the time. She states she is walking 30 minutes 7 times per week. Cindy Carey has been under more stress (dog was sick and had surgery, car transmission). We were unable to weigh the patient today for this TeleHealth visit. She feels as if she has gained weight since her last visit. She has lost 3 lbs since starting treatment with [name].  Diabetes II Cindy Carey has a diagnosis of diabetes type II. Cindy Carey states fasting BGs range between 110 and 120 and her 2 hour post prandial BGs are down about 30 points. I reviewed blood sugar log from patient (improving). Last A1c was at 6.8 She has been working on intensive lifestyle modifications including diet, exercise, and weight loss to help control her blood glucose levels.  Hypertension Cindy Carey is a 63 y.o. female with hypertension.  Cindy Carey denies lightheadedness. She is working weight loss to help control her blood pressure with the goal of decreasing her risk of heart attack and stroke. Cindy Carey blood pressure is 110/70 and is currently controlled.  ASSESSMENT AND PLAN:  Type 2 diabetes mellitus without complication, without long-term current use of insulin (HCC)  Essential hypertension  Class 3 severe obesity with serious comorbidity and body mass index (BMI) of 45.0 to 49.9 in adult, unspecified obesity type (Cindy Carey)  PLAN:  Diabetes II Cindy Carey has been given extensive  diabetes education by myself today including ideal fasting and post-prandial blood glucose readings, individual ideal Hgb A1c goals and hypoglycemia prevention. We discussed the importance of good blood sugar control to decrease the likelihood of diabetic complications such as nephropathy, neuropathy, limb loss, blindness, coronary artery disease, and death. We discussed the importance of intensive lifestyle modification including diet, exercise and weight loss as the first line treatment for diabetes. Cindy Carey agrees to continue her diabetes medications and will follow up at the agreed upon time.  Hypertension We discussed sodium restriction, working on healthy weight loss, and a regular exercise program as the means to achieve improved blood pressure control. Cindy Carey agreed with this plan and agreed to follow up as directed. We will continue to monitor her blood pressure as well as her progress with the above lifestyle modifications. She will continue her medications as prescribed and will watch for signs of hypotension as she continues her lifestyle modifications.  Obesity Cindy Carey is currently in the action stage of change. As such, her goal is to continue with weight loss efforts She has agreed to follow the Pescatarian eating plan Cindy Carey will continue her exercise regimen for weight loss and overall health benefits. We discussed the following Behavioral Modification Strategies today: increase H2O intake, no skipping meals, keeping healthy foods in the home, increasing lean protein intake, decreasing simple carbohydrates, increasing vegetables, decrease eating out, work on meal planning and intentional eating and decrease liquid calories  Cindy Carey has agreed to follow up with our clinic in 2 weeks. She was informed of the importance of frequent  follow up visits to maximize her success with intensive lifestyle modifications for her multiple health conditions.  ALLERGIES: Allergies  Allergen Reactions   Bee  Venom Anaphylaxis   Shellfish Allergy Anaphylaxis   Invokana [Canagliflozin] Itching   Penicillins Hives    MEDICATIONS: Current Outpatient Medications on File Prior to Visit  Medication Sig Dispense Refill   Accu-Chek FastClix Lancets MISC USE TO CHECK BLOOD SUGAR TWICE A DAY AS DIRECTED 102 each 3   ALPRAZolam (XANAX) 0.25 MG tablet Take 1-2 tablets (0.25-0.5 mg total) by mouth at bedtime as needed for anxiety or sleep. 15 tablet 0   aspirin EC 81 MG tablet Take 81 mg by mouth daily.     Calcium Carbonate-Vit D-Min (CALCIUM 600+D PLUS MINERALS) 600-400 MG-UNIT TABS Take 1 tablet by mouth 2 (two) times daily.     cyclobenzaprine (FLEXERIL) 10 MG tablet Take 1 tablet (10 mg total) by mouth 3 (three) times daily as needed for muscle spasms. 30 tablet 0   empagliflozin (JARDIANCE) 10 MG TABS tablet Take 10 mg by mouth daily with breakfast. 30 tablet 0   EPINEPHrine 0.3 mg/0.3 mL IJ SOAJ injection Inject 0.3 mLs (0.3 mg total) into the muscle once as needed. Reported on 04/30/2016 1 Device 0   glucose blood (ACCU-CHEK GUIDE) test strip USE TO CHECK BLOOD SUGAR TWICE A DAY AS DIRECTED 100 each 3   lisinopril (PRINIVIL,ZESTRIL) 10 MG tablet TAKE 1 TABLET (10 MG TOTAL) BY MOUTH DAILY. 90 tablet 0   Magnesium 250 MG TABS Take 250 mg by mouth daily.     metFORMIN (GLUCOPHAGE-XR) 500 MG 24 hr tablet Take 2 tablets (1,000 mg total) by mouth 2 (two) times daily. 360 tablet 1   METRONIDAZOLE, TOPICAL, 0.75 % LOTN Apply 1 application topically 2 (two) times daily. 59 mL 12   Multiple Vitamins-Minerals (MULTIVITAMIN PO) Take 1 tablet by mouth daily. Reported on 03/31/2016     promethazine (PHENERGAN) 25 MG tablet Take 1 tablet (25 mg total) by mouth every 8 (eight) hours as needed for nausea. 30 tablet 0   simvastatin (ZOCOR) 40 MG tablet Take 1 tablet (40 mg total) by mouth every evening. 90 tablet 0   tamoxifen (NOLVADEX) 20 MG tablet Take 1 tablet (20 mg total) by mouth daily. 90 tablet  3   No current facility-administered medications on file prior to visit.     PAST MEDICAL HISTORY: Past Medical History:  Diagnosis Date   Anemia    Asthma    Blood transfusion without reported diagnosis 1983   Diabetes mellitus without complication ([place]) 8341   T2DM   Dyspareunia    Exercise-induced asthma    worse in winter   H/O bone density study 2013   H/O cold sores    H/O colonoscopy 2009   History of endometriosis    Hyperlipidemia    Hypertension    Injury of tendon of rotator cuff 2015   gym injury (right)   Knee pain 08/2015   L>R   Migraine with typical aura    Mild CAD 04/04/2018   Mild disease on coronary CT-A 01/2018.   Morbid obesity ([place]) 09/04/12   BMI 49.4 kg/m^2    Osteoarthritis    Plantar fasciitis, left 2019   Trigger finger of left thumb 08/2018   injected by Dr. Fredna Dow 09/18/18    PAST SURGICAL HISTORY: Past Surgical History:  Procedure Laterality Date   BREAST BIOPSY  2010   BREAST LUMPECTOMY  2010   BREAST LUMPECTOMY WITH  RADIOACTIVE SEED LOCALIZATION Left 07/23/2015   Procedure: LEFT BREAST SEED GUIDED EXCISION;  Surgeon: Rolm Bookbinder, MD;  Location: [facility];  Service: General;  Laterality: Left;   BREATH TEK H PYLORI  08/28/2012   Procedure: BREATH TEK H PYLORI;  Surgeon: Pedro Earls, MD;  Location: [facility];  Service: General;  Laterality: N/A;   CHOLECYSTECTOMY  2007   EXPLORATORY LAPAROTOMY  1983   endometriosis   GANGLION CYST EXCISION Left 1999   Foot   TOTAL ABDOMINAL HYSTERECTOMY W/ BILATERAL SALPINGOOPHORECTOMY  1984   endometriosis (and appendectomy)    SOCIAL HISTORY: Social History   Tobacco Use   Smoking status: Never Smoker   Smokeless tobacco: Never Used  Substance Use Topics   Alcohol use: No   Drug use: No    FAMILY HISTORY: Family History  Problem Relation Age of Onset   Diabetes Father    Heart attack Father 23   Heart disease Father     Hypertension Father    Peripheral Artery Disease Father        stents at 43   Diabetes Paternal Grandfather    Heart disease Paternal Grandfather    Osteoporosis Maternal Grandmother    Heart failure Maternal Grandmother    Thyroid disease Mother        hypothyroidism   Heart disease Mother 1       CABG at 2   Hypertension Mother    Diverticulitis Mother 12       perforation, required surgery   Pneumonia Sister    Cancer Maternal Grandfather        lung   Dementia Paternal Grandmother    Cancer Paternal Grandmother        melanoma on her foot (not related to death)   Breast cancer Paternal Aunt        64's   Cancer Maternal Aunt        lung   Cancer Maternal Uncle        lung   Breast cancer Cousin        mets to lung   Congestive Heart Failure Maternal Uncle    Breast cancer Cousin     ROS: Review of Systems  Constitutional: Negative for weight loss.  Neurological:       Negative for lightheadedness    PHYSICAL EXAM: Pt in no acute distress  RECENT LABS AND TESTS: BMET    Component Value Date/Time   NA 140 12/14/2018 0950   NA 139 11/21/2015 0826   K 4.6 12/14/2018 0950   K 4.7 11/21/2015 0826   CL 103 12/14/2018 0950   CO2 22 12/14/2018 0950   CO2 24 11/21/2015 0826   GLUCOSE 105 (H) 12/14/2018 0950   GLUCOSE 96 09/30/2016 0811   GLUCOSE 125 11/21/2015 0826   BUN 9 12/14/2018 0950   BUN 12.5 11/21/2015 0826   CREATININE 0.80 12/14/2018 0950   CREATININE 0.93 09/30/2016 0811   CREATININE 1.0 11/21/2015 0826   CALCIUM 10.4 (H) 12/14/2018 0950   CALCIUM 9.6 11/21/2015 0826   GFRNONAA 80 12/14/2018 0950   GFRAA 92 12/14/2018 0950   Lab Results  Component Value Date   HGBA1C 6.8 (A) 12/14/2018   HGBA1C 7.0 (H) 07/13/2018   HGBA1C 6.0 (A) 04/13/2018   HGBA1C 6.2 12/07/2017   HGBA1C 6.1 01/05/2017   Lab Results  Component Value Date   INSULIN 59.1 (H) 07/13/2018   CBC    Component Value Date/Time   WBC 7.5  12/14/2018  0950   WBC 7.8 09/30/2016 0811   RBC 4.32 12/14/2018 0950   RBC 4.92 09/30/2016 0811   HGB 13.6 12/14/2018 0950   HGB 15.1 11/21/2015 0826   HCT 40.3 12/14/2018 0950   HCT 46.3 11/21/2015 0826   PLT 94 (LL) 12/14/2018 0950   MCV 93 12/14/2018 0950   MCV 95.1 11/21/2015 0826   MCH 31.5 12/14/2018 0950   MCH 32.3 09/30/2016 0811   MCHC 33.7 12/14/2018 0950   MCHC 33.1 09/30/2016 0811   RDW 13.4 12/14/2018 0950   RDW 15.1 (H) 11/21/2015 0826   LYMPHSABS 2.8 12/14/2018 0950   LYMPHSABS 2.6 11/21/2015 0826   MONOABS 546 09/30/2016 0811   MONOABS 0.7 11/21/2015 0826   EOSABS 0.2 12/14/2018 0950   BASOSABS 0.0 12/14/2018 0950   BASOSABS 0.0 11/21/2015 0826   Iron/TIBC/Ferritin/ %Sat    Component Value Date/Time   IRON 84 12/07/2017 1619   Lipid Panel     Component Value Date/Time   CHOL 120 12/14/2018 0950   TRIG 100 12/14/2018 0950   HDL 58 12/14/2018 0950   CHOLHDL 2.1 12/14/2018 0950   CHOLHDL 2.3 09/30/2016 0811   VLDL 26 09/30/2016 0811   LDLCALC 42 12/14/2018 0950   Hepatic Function Panel     Component Value Date/Time   PROT 6.7 12/14/2018 0950   PROT 7.2 11/21/2015 0826   ALBUMIN 4.0 12/14/2018 0950   ALBUMIN 3.8 11/21/2015 0826   AST 56 (H) 12/14/2018 0950   AST 24 11/21/2015 0826   ALT 29 12/14/2018 0950   ALT 24 11/21/2015 0826   ALKPHOS 97 12/14/2018 0950   ALKPHOS 75 11/21/2015 0826   BILITOT 0.4 12/14/2018 0950   BILITOT 0.58 11/21/2015 0826      Component Value Date/Time   TSH 2.240 12/14/2018 0950   TSH 2.200 12/07/2017 1619   TSH 1.79 03/31/2016 0859     Ref. Range 12/14/2018 09:50  Vitamin D, 25-Hydroxy Latest Ref Range: 30.0 - 100.0 ng/mL 48.7    I, Doreene Nest, am acting as Location manager for [employer]. Owens Shark, DO  I have reviewed the above documentation for accuracy and completeness, and I agree with the above. -Jearld Lesch, DO

## 2019-03-22 NOTE — Telephone Encounter (Signed)
Pt called and wanting to know if she can have blood work that a Dr Production assistant, radio ordered, the orders are in there, pt can be reached at [phone]

## 2019-03-22 NOTE — Telephone Encounter (Signed)
Do we still fill this for her? If so, is this ok to refill?

## 2019-03-23 ENCOUNTER — Other Ambulatory Visit: Payer: No Typology Code available for payment source

## 2019-03-23 ENCOUNTER — Other Ambulatory Visit: Payer: Self-pay

## 2019-03-23 DIAGNOSIS — D696 Thrombocytopenia, unspecified: Secondary | ICD-10-CM

## 2019-03-23 DIAGNOSIS — E559 Vitamin D deficiency, unspecified: Secondary | ICD-10-CM

## 2019-03-23 DIAGNOSIS — E119 Type 2 diabetes mellitus without complications: Secondary | ICD-10-CM

## 2019-03-24 LAB — CBC WITH DIFFERENTIAL/PLATELET
Basophils Absolute: 0 10*3/uL (ref 0.0–0.2)
Basos: 0 %
EOS (ABSOLUTE): 0.1 10*3/uL (ref 0.0–0.4)
Eos: 2 %
Hematocrit: 40.5 % (ref 34.0–46.6)
Hemoglobin: 13.1 g/dL (ref 11.1–15.9)
Immature Grans (Abs): 0 10*3/uL (ref 0.0–0.1)
Immature Granulocytes: 0 %
Lymphocytes Absolute: 2.3 10*3/uL (ref 0.7–3.1)
Lymphs: 41 %
MCH: 30.1 pg (ref 26.6–33.0)
MCHC: 32.3 g/dL (ref 31.5–35.7)
MCV: 93 fL (ref 79–97)
Monocytes Absolute: 0.6 10*3/uL (ref 0.1–0.9)
Monocytes: 11 %
Neutrophils Absolute: 2.6 10*3/uL (ref 1.4–7.0)
Neutrophils: 46 %
Platelets: 94 10*3/uL — CL (ref 150–450)
RBC: 4.35 x10E6/uL (ref 3.77–5.28)
RDW: 13.5 % (ref 11.7–15.4)
WBC: 5.6 10*3/uL (ref 3.4–10.8)

## 2019-03-24 LAB — COMPREHENSIVE METABOLIC PANEL
ALT: 27 IU/L (ref 0–32)
AST: 50 IU/L — ABNORMAL HIGH (ref 0–40)
Albumin/Globulin Ratio: 1.2 (ref 1.2–2.2)
Albumin: 3.5 g/dL — ABNORMAL LOW (ref 3.8–4.8)
Alkaline Phosphatase: 103 IU/L (ref 39–117)
BUN/Creatinine Ratio: 16 (ref 12–28)
BUN: 14 mg/dL (ref 8–27)
Bilirubin Total: 0.4 mg/dL (ref 0.0–1.2)
CO2: 22 mmol/L (ref 20–29)
Calcium: 9.4 mg/dL (ref 8.7–10.3)
Chloride: 103 mmol/L (ref 96–106)
Creatinine, Ser: 0.9 mg/dL (ref 0.57–1.00)
GFR calc Af Amer: 80 mL/min/{1.73_m2} (ref >59)
GFR calc non Af Amer: 69 mL/min/{1.73_m2} (ref >59)
Globulin, Total: 3 g/dL (ref 1.5–4.5)
Glucose: 115 mg/dL — ABNORMAL HIGH (ref 65–99)
Potassium: 5.3 mmol/L — ABNORMAL HIGH (ref 3.5–5.2)
Sodium: 138 mmol/L (ref 134–144)
Total Protein: 6.5 g/dL (ref 6.0–8.5)

## 2019-03-24 LAB — VITAMIN D 25 HYDROXY (VIT D DEFICIENCY, FRACTURES): Vit D, 25-Hydroxy: 41.4 ng/mL (ref 30.0–100.0)

## 2019-03-24 LAB — HEMOGLOBIN A1C
Est. average glucose Bld gHb Est-mCnc: 154 mg/dL
Hgb A1c MFr Bld: 7 % — ABNORMAL HIGH (ref 4.8–5.6)

## 2019-03-24 LAB — INSULIN, RANDOM: INSULIN: 40.3 u[IU]/mL — ABNORMAL HIGH (ref 2.6–24.9)

## 2019-03-25 ENCOUNTER — Other Ambulatory Visit: Payer: Self-pay | Admitting: Family Medicine

## 2019-03-25 DIAGNOSIS — E785 Hyperlipidemia, unspecified: Secondary | ICD-10-CM

## 2019-03-26 ENCOUNTER — Other Ambulatory Visit: Payer: Self-pay | Admitting: Family Medicine

## 2019-03-26 DIAGNOSIS — E785 Hyperlipidemia, unspecified: Secondary | ICD-10-CM

## 2019-03-26 MED ORDER — SIMVASTATIN 40 MG PO TABS: 40.0000 mg | ORAL_TABLET | Freq: Every evening | ORAL | 1 refills | Status: DC

## 2019-03-26 MED FILL — SIMVASTATIN 40 MG TABLET: 40 | 90 days supply | Qty: 90 | Fill #0

## 2019-03-26 MED FILL — ACCU-CHEK GUIDE TEST STRIP: 50 days supply | Qty: 100 | Fill #0

## 2019-03-26 NOTE — Telephone Encounter (Signed)
Is this ok to refill?  

## 2019-03-28 ENCOUNTER — Encounter: Payer: Self-pay | Admitting: Family Medicine

## 2019-04-03 ENCOUNTER — Other Ambulatory Visit: Payer: Self-pay

## 2019-04-03 ENCOUNTER — Encounter (INDEPENDENT_AMBULATORY_CARE_PROVIDER_SITE_OTHER): Payer: Self-pay | Admitting: Bariatrics

## 2019-04-03 ENCOUNTER — Ambulatory Visit (INDEPENDENT_AMBULATORY_CARE_PROVIDER_SITE_OTHER): Payer: No Typology Code available for payment source | Admitting: Bariatrics

## 2019-04-03 DIAGNOSIS — E119 Type 2 diabetes mellitus without complications: Secondary | ICD-10-CM | POA: Diagnosis not present

## 2019-04-03 DIAGNOSIS — Z6841 Body Mass Index (BMI) 40.0 and over, adult: Secondary | ICD-10-CM | POA: Diagnosis not present

## 2019-04-03 DIAGNOSIS — I10 Essential (primary) hypertension: Secondary | ICD-10-CM | POA: Diagnosis not present

## 2019-04-04 ENCOUNTER — Encounter (INDEPENDENT_AMBULATORY_CARE_PROVIDER_SITE_OTHER): Payer: Self-pay | Admitting: Bariatrics

## 2019-04-04 NOTE — Progress Notes (Signed)
Office: [phone]  /  Fax: [phone] TeleHealth Visit:  Cindy Carey has verbally consented to this TeleHealth visit today. The patient is located at work, the provider is located at the [employer] [facility] office. The participants in this visit include the listed provider and patient. The visit was conducted today via Webex.  HPI:   Chief Complaint: OBESITY Cindy Carey is here to discuss her progress with her obesity treatment plan. She is on the Pescatarian plan and is following her eating plan approximately 90% of the time. She states she is walking her dog 30 minutes 7 times per week. Cindy Carey states that she has lost 1 lb (weight 258 lbs). She states that she is doing well and is doing well with getting in her protein.  We were unable to weigh the patient today for this TeleHealth visit. She feels as if she has lost weight since her last visit. She has lost 0 lbs since starting treatment with [name].  Diabetes II Cindy Carey has a diagnosis of diabetes type II. Cindy Carey states fasting blood sugars are in the range of 90 and 100. She denies any hypoglycemic episodes. Last A1c was 7.0 on 03/23/2019. She has been working on intensive lifestyle modifications including diet, exercise, and weight loss to help control her blood glucose levels. No polyphagia.  Hypertension Cindy Carey is a 63 y.o. female with hypertension, which is well controlled. She is taking Lisinopril and reports no side effects. Cindy Carey denies chest pain or shortness of breath on exertion. She is working weight loss to help control her blood pressure with the goal of decreasing her risk of heart attack and stroke. Cindy Carey's blood pressure is currently controlled.  ASSESSMENT AND PLAN:  Essential hypertension  Type 2 diabetes mellitus without complication, without long-term current use of insulin (HCC)  Class 3 severe obesity with serious comorbidity and body mass index (BMI) of 45.0 to 49.9 in adult, unspecified  obesity type ([place])  PLAN:  Diabetes II Cindy Carey has been given extensive diabetes education by myself today including ideal fasting and post-prandial blood glucose readings, individual ideal HgA1c goals  and hypoglycemia prevention. We discussed the importance of good blood sugar control to decrease the likelihood of diabetic complications such as nephropathy, neuropathy, limb loss, blindness, coronary artery disease, and death. We discussed the importance of intensive lifestyle modification including diet, exercise and weight loss as the first line treatment for diabetes. Cindy Carey was given a prescription for Jardiance 10 mg to be taken once daily #90 with 0 refills. She agrees to follow-up with our clinic in 2 weeks.  Hypertension We discussed sodium restriction, working on healthy weight loss, and a regular exercise program as the means to achieve improved blood pressure control. Cindy Carey agreed with this plan and agreed to follow up as directed. We will continue to monitor her blood pressure as well as her progress with the above lifestyle modifications. She will continue her medications as prescribed and will watch for signs of hypotension as she continues her lifestyle modifications.  Obesity Cindy Carey is currently in the action stage of change. As such, her goal is to continue with weight loss efforts. She has agreed to follow the Pescatarian plan. Cindy Carey will work on meal planning and will weigh herself at home and record until she returns to the office. Cindy Carey has been instructed to continue her current exercise regimen for weight loss and overall health benefits. We discussed the following Behavioral Modification Strategies today: increasing lean protein intake, decreasing  simple carbohydrates, increasing vegetables, increase H20 intake, increasing fiber rich foods, decrease eating out, no skipping meals, work on meal planning and easy cooking plans, keeping healthy foods in the home, and planning for success.   Cindy Carey has agreed to follow-up with our clinic in 2 weeks. She was informed of the importance of frequent follow-up visits to maximize her success with intensive lifestyle modifications for her multiple health conditions.  ALLERGIES: Allergies  Allergen Reactions  . Bee Venom Anaphylaxis  . Shellfish Allergy Anaphylaxis  . Invokana [Canagliflozin] Itching  . Penicillins Hives    MEDICATIONS: Current Outpatient Medications on File Prior to Visit  Medication Sig Dispense Refill  . Accu-Chek FastClix Lancets MISC USE TO CHECK BLOOD SUGAR TWICE A DAY AS DIRECTED 102 each 3  . ACCU-CHEK GUIDE test strip USE TO CHECK BLOOD SUGAR TWICE A DAY AS DIRECTED 100 each 1  . ALPRAZolam (XANAX) 0.25 MG tablet Take 1-2 tablets (0.25-0.5 mg total) by mouth at bedtime as needed for anxiety or sleep. 15 tablet 0  . aspirin EC 81 MG tablet Take 81 mg by mouth daily.    . Calcium Carbonate-Vit D-Min (CALCIUM 600+D PLUS MINERALS) 600-400 MG-UNIT TABS Take 1 tablet by mouth 2 (two) times daily.    . cyclobenzaprine (FLEXERIL) 10 MG tablet Take 1 tablet (10 mg total) by mouth 3 (three) times daily as needed for muscle spasms. 30 tablet 0  . empagliflozin (JARDIANCE) 10 MG TABS tablet Take 10 mg by mouth daily with breakfast. 30 tablet 0  . EPINEPHrine 0.3 mg/0.3 mL IJ SOAJ injection Inject 0.3 mLs (0.3 mg total) into the muscle once as needed. Reported on 04/30/2016 1 Device 0  . lisinopril (PRINIVIL,ZESTRIL) 10 MG tablet TAKE 1 TABLET (10 MG TOTAL) BY MOUTH DAILY. 90 tablet 0  . Magnesium 250 MG TABS Take 250 mg by mouth daily.    . metFORMIN (GLUCOPHAGE-XR) 500 MG 24 hr tablet Take 2 tablets (1,000 mg total) by mouth 2 (two) times daily. 360 tablet 1  . METRONIDAZOLE, TOPICAL, 0.75 % LOTN Apply 1 application topically 2 (two) times daily. 59 mL 12  . Multiple Vitamins-Minerals (MULTIVITAMIN PO) Take 1 tablet by mouth daily. Reported on 03/31/2016    . promethazine (PHENERGAN) 25 MG tablet Take 1 tablet (25 mg  total) by mouth every 8 (eight) hours as needed for nausea. 30 tablet 0  . simvastatin (ZOCOR) 40 MG tablet Take 1 tablet (40 mg total) by mouth every evening. 90 tablet 1  . tamoxifen (NOLVADEX) 20 MG tablet Take 1 tablet (20 mg total) by mouth daily. 90 tablet 3  . TRULICITY 1.5 EH/6.3JS SOPN INJECT 1.5 MG INTO THE SKIN EVERY 7 DAYS. 6 mL 2   No current facility-administered medications on file prior to visit.     PAST MEDICAL HISTORY: Past Medical History:  Diagnosis Date  . Anemia   . Asthma   . Blood transfusion without reported diagnosis 1983  . Diabetes mellitus without complication ([place]) 9702   T2DM  . Dyspareunia   . Exercise-induced asthma    worse in winter  . H/O bone density study 2013  . H/O cold sores   . H/O colonoscopy 2009  . History of endometriosis   . Hyperlipidemia   . Hypertension   . Injury of tendon of rotator cuff 2015   gym injury (right)  . Knee pain 08/2015   L>R  . Migraine with typical aura   . Mild CAD 04/04/2018   Mild disease on  coronary CT-A 01/2018.  . Morbid obesity ([place]) 09/04/12   BMI 49.4 kg/m^2   . Osteoarthritis   . Plantar fasciitis, left 2019  . Trigger finger of left thumb 08/2018   injected by Dr. Fredna Dow 09/18/18    PAST SURGICAL HISTORY: Past Surgical History:  Procedure Laterality Date  . BREAST BIOPSY  2010  . BREAST LUMPECTOMY  2010  . BREAST LUMPECTOMY WITH RADIOACTIVE SEED LOCALIZATION Left 07/23/2015   Procedure: LEFT BREAST SEED GUIDED EXCISION;  Surgeon: Rolm Bookbinder, MD;  Location: [facility];  Service: General;  Laterality: Left;  . BREATH TEK H PYLORI  08/28/2012   Procedure: BREATH TEK H PYLORI;  Surgeon: Pedro Earls, MD;  Location: [facility];  Service: General;  Laterality: N/A;  . CHOLECYSTECTOMY  2007  . EXPLORATORY LAPAROTOMY  1983   endometriosis  . GANGLION CYST EXCISION Left 1999   Foot  . TOTAL ABDOMINAL HYSTERECTOMY W/ BILATERAL SALPINGOOPHORECTOMY  1984   endometriosis  (and appendectomy)    SOCIAL HISTORY: Social History   Tobacco Use  . Smoking status: Never Smoker  . Smokeless tobacco: Never Used  Substance Use Topics  . Alcohol use: No  . Drug use: No    FAMILY HISTORY: Family History  Problem Relation Age of Onset  . Diabetes Father   . Heart attack Father 40  . Heart disease Father   . Hypertension Father   . Peripheral Artery Disease Father        stents at 11  . Diabetes Paternal Grandfather   . Heart disease Paternal Grandfather   . Osteoporosis Maternal Grandmother   . Heart failure Maternal Grandmother   . Thyroid disease Mother        hypothyroidism  . Heart disease Mother 42       CABG at 62  . Hypertension Mother   . Diverticulitis Mother 68       perforation, required surgery  . Pneumonia Sister   . Cancer Maternal Grandfather        lung  . Dementia Paternal Grandmother   . Cancer Paternal Grandmother        melanoma on her foot (not related to death)  . Breast cancer Paternal Aunt        39's  . Cancer Maternal Aunt        lung  . Cancer Maternal Uncle        lung  . Breast cancer Cousin        mets to lung  . Congestive Heart Failure Maternal Uncle   . Breast cancer Cousin    ROS: Review of Systems  Endo/Heme/Allergies:       Negative for hypoglycemia. Negative for polyphagia.   PHYSICAL EXAM: Pt in no acute distress  RECENT LABS AND TESTS: BMET    Component Value Date/Time   NA 138 03/23/2019 0831   NA 139 11/21/2015 0826   K 5.3 (H) 03/23/2019 0831   K 4.7 11/21/2015 0826   CL 103 03/23/2019 0831   CO2 22 03/23/2019 0831   CO2 24 11/21/2015 0826   GLUCOSE 115 (H) 03/23/2019 0831   GLUCOSE 96 09/30/2016 0811   GLUCOSE 125 11/21/2015 0826   BUN 14 03/23/2019 0831   BUN 12.5 11/21/2015 0826   CREATININE 0.90 03/23/2019 0831   CREATININE 0.93 09/30/2016 0811   CREATININE 1.0 11/21/2015 0826   CALCIUM 9.4 03/23/2019 0831   CALCIUM 9.6 11/21/2015 0826   GFRNONAA 69 03/23/2019 0831    GFRAA  80 03/23/2019 0831   Lab Results  Component Value Date   HGBA1C 7.0 (H) 03/23/2019   HGBA1C 6.8 (A) 12/14/2018   HGBA1C 7.0 (H) 07/13/2018   HGBA1C 6.0 (A) 04/13/2018   HGBA1C 6.2 12/07/2017   Lab Results  Component Value Date   INSULIN 40.3 (H) 03/23/2019   INSULIN 59.1 (H) 07/13/2018   CBC    Component Value Date/Time   WBC 5.6 03/23/2019 0831   WBC 7.8 09/30/2016 0811   RBC 4.35 03/23/2019 0831   RBC 4.92 09/30/2016 0811   HGB 13.1 03/23/2019 0831   HGB 15.1 11/21/2015 0826   HCT 40.5 03/23/2019 0831   HCT 46.3 11/21/2015 0826   PLT 94 (LL) 03/23/2019 0831   MCV 93 03/23/2019 0831   MCV 95.1 11/21/2015 0826   MCH 30.1 03/23/2019 0831   MCH 32.3 09/30/2016 0811   MCHC 32.3 03/23/2019 0831   MCHC 33.1 09/30/2016 0811   RDW 13.5 03/23/2019 0831   RDW 15.1 (H) 11/21/2015 0826   LYMPHSABS 2.3 03/23/2019 0831   LYMPHSABS 2.6 11/21/2015 0826   MONOABS 546 09/30/2016 0811   MONOABS 0.7 11/21/2015 0826   EOSABS 0.1 03/23/2019 0831   BASOSABS 0.0 03/23/2019 0831   BASOSABS 0.0 11/21/2015 0826   Iron/TIBC/Ferritin/ %Sat    Component Value Date/Time   IRON 84 12/07/2017 1619   Lipid Panel     Component Value Date/Time   CHOL 120 12/14/2018 0950   TRIG 100 12/14/2018 0950   HDL 58 12/14/2018 0950   CHOLHDL 2.1 12/14/2018 0950   CHOLHDL 2.3 09/30/2016 0811   VLDL 26 09/30/2016 0811   LDLCALC 42 12/14/2018 0950   Hepatic Function Panel     Component Value Date/Time   PROT 6.5 03/23/2019 0831   PROT 7.2 11/21/2015 0826   ALBUMIN 3.5 (L) 03/23/2019 0831   ALBUMIN 3.8 11/21/2015 0826   AST 50 (H) 03/23/2019 0831   AST 24 11/21/2015 0826   ALT 27 03/23/2019 0831   ALT 24 11/21/2015 0826   ALKPHOS 103 03/23/2019 0831   ALKPHOS 75 11/21/2015 0826   BILITOT 0.4 03/23/2019 0831   BILITOT 0.58 11/21/2015 0826      Component Value Date/Time   TSH 2.240 12/14/2018 0950   TSH 2.200 12/07/2017 1619   TSH 1.79 03/31/2016 0859   Results for MALU, PELLEGRINI (MRN [id number]) as of 04/04/2019 07:55  Ref. Range 03/23/2019 08:31  Vitamin D, 25-Hydroxy Latest Ref Range: 30.0 - 100.0 ng/mL 41.4   I, [name] Song, am acting as Location manager for [employer], DO  I have reviewed the above documentation for accuracy and completeness, and I agree with the above. -Jearld Lesch, DO

## 2019-04-08 ENCOUNTER — Encounter (INDEPENDENT_AMBULATORY_CARE_PROVIDER_SITE_OTHER): Payer: Self-pay | Admitting: Bariatrics

## 2019-04-08 ENCOUNTER — Other Ambulatory Visit (INDEPENDENT_AMBULATORY_CARE_PROVIDER_SITE_OTHER): Payer: Self-pay

## 2019-04-08 DIAGNOSIS — E119 Type 2 diabetes mellitus without complications: Secondary | ICD-10-CM

## 2019-04-08 MED ORDER — JARDIANCE 10 MG PO TABS: 10.0000 mg | ORAL_TABLET | Freq: Every day | ORAL | 0 refills | Status: DC

## 2019-04-11 MED FILL — JARDIANCE 10 MG TABLET: 10 | 60 days supply | Qty: 60 | Fill #0

## 2019-04-18 ENCOUNTER — Ambulatory Visit (INDEPENDENT_AMBULATORY_CARE_PROVIDER_SITE_OTHER): Payer: No Typology Code available for payment source | Admitting: Bariatrics

## 2019-04-18 ENCOUNTER — Encounter (INDEPENDENT_AMBULATORY_CARE_PROVIDER_SITE_OTHER): Payer: Self-pay | Admitting: Bariatrics

## 2019-04-18 ENCOUNTER — Other Ambulatory Visit: Payer: Self-pay

## 2019-04-18 VITALS — BP 104/70 | HR 88 | Temp 98.5°F | Ht 63.0 in | Wt 259.0 lb

## 2019-04-18 DIAGNOSIS — E559 Vitamin D deficiency, unspecified: Secondary | ICD-10-CM

## 2019-04-18 DIAGNOSIS — I10 Essential (primary) hypertension: Secondary | ICD-10-CM | POA: Diagnosis not present

## 2019-04-18 DIAGNOSIS — Z9189 Other specified personal risk factors, not elsewhere classified: Secondary | ICD-10-CM | POA: Diagnosis not present

## 2019-04-18 DIAGNOSIS — E119 Type 2 diabetes mellitus without complications: Secondary | ICD-10-CM

## 2019-04-18 DIAGNOSIS — Z6841 Body Mass Index (BMI) 40.0 and over, adult: Secondary | ICD-10-CM

## 2019-04-18 NOTE — Progress Notes (Signed)
Office: [phone]  /  Fax: [phone]   HPI:   Chief Complaint: OBESITY Cindy Carey is here to discuss her progress with her obesity treatment plan. She is on the Pescatarian eating plan and is following her eating plan approximately 50 % of the time. She states she is walking her dog 30 minutes 7 times per week. Cindy Carey has been crazy busy at work. She is doing well with her water, and she has increased her protein. Her weight is 259 lb (117.5 kg) today and has had a weight loss of 4 pounds over a period of 2 weeks since her last visit. She has lost 3 lbs since starting treatment with [name].  Diabetes II Cindy Carey has a diagnosis of diabetes type II. She is taking Jardiance and Metformin without any side effects. Cindy Carey states fasting BGs range between 90 and 100's. She denies any hypoglycemic episodes. Last A1c was at 7.0 and last insulin level was at 40.3 She has been working on intensive lifestyle modifications including diet, exercise, and weight loss to help control her blood glucose levels.  Hypertension Cindy Carey is a 63 y.o. female with hypertension. She is currently on medications. Cindy Carey denies chest pain or shortness of breath on exertion. She is working weight loss to help control her blood pressure with the goal of decreasing her risk of heart attack and stroke. [name] blood pressure is well controlled.  Vitamin D deficiency Cindy Carey has a diagnosis of vitamin D deficiency. She is currently taking vit D and denies nausea, vomiting or muscle weakness.  At risk for osteopenia and osteoporosis Cindy Carey is at higher risk of osteopenia and osteoporosis due to vitamin D deficiency.   ASSESSMENT AND PLAN:  Type 2 diabetes mellitus without complication, without long-term current use of insulin (HCC)  Essential hypertension  Vitamin D deficiency  At risk for osteoporosis  Class 3 severe obesity with serious comorbidity and body mass index (BMI) of 45.0 to 49.9 in adult,  unspecified obesity type ([place])  PLAN:  Diabetes II Sarika has been given extensive diabetes education by myself today including ideal fasting and post-prandial blood glucose readings, individual ideal Hgb A1c goals and hypoglycemia prevention. We discussed the importance of good blood sugar control to decrease the likelihood of diabetic complications such as nephropathy, neuropathy, limb loss, blindness, coronary artery disease, and death. We discussed the importance of intensive lifestyle modification including diet, exercise and weight loss as the first line treatment for diabetes. Searra agrees to continue her diabetes medications and will follow up at the agreed upon time.  Hypertension We discussed sodium restriction, working on healthy weight loss, and a regular exercise program as the means to achieve improved blood pressure control. Vung agreed with this plan and agreed to follow up as directed. We will continue to monitor her blood pressure as well as her progress with the above lifestyle modifications. She will continue her medications as prescribed and will watch for signs of hypotension as she continues her lifestyle modifications.  Vitamin D Deficiency Cindy Carey was informed that low vitamin D levels contributes to fatigue and are associated with obesity, breast, and colon cancer. She agrees to take Vitamin D3 1,000 IU once daily and will follow up for routine testing of vitamin D, at least 2-3 times per year. She was informed of the risk of over-replacement of vitamin D and agrees to not increase her dose unless she discusses this with [name] first. Cindy Carey agrees to follow up as directed.  At risk  for osteopenia and osteoporosis Cindy Carey was given extended  (15 minutes) osteoporosis prevention counseling today. Cindy Carey is at risk for osteopenia and osteoporosis due to her vitamin D deficiency. She was encouraged to take her vitamin D and follow her higher calcium diet and increase strengthening exercise to  help strengthen her bones and decrease her risk of osteopenia and osteoporosis.  Obesity Cindy Carey is currently in the action stage of change. As such, her goal is to continue with weight loss efforts She has agreed to follow the [place] eating plan Cindy Carey has been instructed to work up to a goal of 150 minutes of combined cardio and strengthening exercise per week for weight loss and overall health benefits. We discussed the following Behavioral Modification Strategies today: increase H2O intake, no skipping meals, keeping healthy foods in the home, increasing lean protein intake, decreasing simple carbohydrates, increasing vegetables, decrease eating out and work on meal planning and intentional eating Cindy Carey will work on Child psychotherapist.  Cindy Carey has agreed to follow up with our clinic in 2 weeks. She was informed of the importance of frequent follow up visits to maximize her success with intensive lifestyle modifications for her multiple health conditions.  ALLERGIES: Allergies  Allergen Reactions  . Bee Venom Anaphylaxis  . Shellfish Allergy Anaphylaxis  . Invokana [Canagliflozin] Itching  . Penicillins Hives    MEDICATIONS: Current Outpatient Medications on File Prior to Visit  Medication Sig Dispense Refill  . Accu-Chek FastClix Lancets MISC USE TO CHECK BLOOD SUGAR TWICE A DAY AS DIRECTED 102 each 3  . ACCU-CHEK GUIDE test strip USE TO CHECK BLOOD SUGAR TWICE A DAY AS DIRECTED 100 each 1  . ALPRAZolam (XANAX) 0.25 MG tablet Take 1-2 tablets (0.25-0.5 mg total) by mouth at bedtime as needed for anxiety or sleep. 15 tablet 0  . aspirin EC 81 MG tablet Take 81 mg by mouth daily.    . Calcium Carbonate-Vit D-Min (CALCIUM 600+D PLUS MINERALS) 600-400 MG-UNIT TABS Take 1 tablet by mouth 2 (two) times daily.    . cyclobenzaprine (FLEXERIL) 10 MG tablet Take 1 tablet (10 mg total) by mouth 3 (three) times daily as needed for muscle spasms. 30 tablet 0  . empagliflozin (JARDIANCE) 10 MG TABS  tablet Take 10 mg by mouth daily with breakfast. 90 tablet 0  . EPINEPHrine 0.3 mg/0.3 mL IJ SOAJ injection Inject 0.3 mLs (0.3 mg total) into the muscle once as needed. Reported on 04/30/2016 1 Device 0  . lisinopril (PRINIVIL,ZESTRIL) 10 MG tablet TAKE 1 TABLET (10 MG TOTAL) BY MOUTH DAILY. 90 tablet 0  . Magnesium 250 MG TABS Take 250 mg by mouth daily.    [name] METRONIDAZOLE, TOPICAL, 0.75 % LOTN Apply 1 application topically 2 (two) times daily. 59 mL 12  . Multiple Vitamins-Minerals (MULTIVITAMIN PO) Take 1 tablet by mouth daily. Reported on 03/31/2016    . promethazine (PHENERGAN) 25 MG tablet Take 1 tablet (25 mg total) by mouth every 8 (eight) hours as needed for nausea. 30 tablet 0  . simvastatin (ZOCOR) 40 MG tablet Take 1 tablet (40 mg total) by mouth every evening. 90 tablet 1  . tamoxifen (NOLVADEX) 20 MG tablet Take 1 tablet (20 mg total) by mouth daily. 90 tablet 3  . TRULICITY 1.5 WI/0.9BD SOPN INJECT 1.5 MG INTO THE SKIN EVERY 7 DAYS. 6 mL 2  . metFORMIN (GLUCOPHAGE-XR) 500 MG 24 hr tablet Take 2 tablets (1,000 mg total) by mouth 2 (two) times daily. 360 tablet 1   No current  facility-administered medications on file prior to visit.     PAST MEDICAL HISTORY: Past Medical History:  Diagnosis Date  . Anemia   . Asthma   . Blood transfusion without reported diagnosis 1983  . Diabetes mellitus without complication ([place]) 9417   T2DM  . Dyspareunia   . Exercise-induced asthma    worse in winter  . H/O bone density study 2013  . H/O cold sores   . H/O colonoscopy 2009  . History of endometriosis   . Hyperlipidemia   . Hypertension   . Injury of tendon of rotator cuff 2015   gym injury (right)  . Knee pain 08/2015   L>R  . Migraine with typical aura   . Mild CAD 04/04/2018   Mild disease on coronary CT-A 01/2018.  . Morbid obesity ([place]) 09/04/12   BMI 49.4 kg/m^2   . Osteoarthritis   . Plantar fasciitis, left 2019  . Trigger finger of left thumb 08/2018   injected by Dr.  Fredna Dow 09/18/18    PAST SURGICAL HISTORY: Past Surgical History:  Procedure Laterality Date  . BREAST BIOPSY  2010  . BREAST LUMPECTOMY  2010  . BREAST LUMPECTOMY WITH RADIOACTIVE SEED LOCALIZATION Left 07/23/2015   Procedure: LEFT BREAST SEED GUIDED EXCISION;  Surgeon: Rolm Bookbinder, MD;  Location: [facility];  Service: General;  Laterality: Left;  . BREATH TEK H PYLORI  08/28/2012   Procedure: BREATH TEK H PYLORI;  Surgeon: Pedro Earls, MD;  Location: [facility];  Service: General;  Laterality: N/A;  . CHOLECYSTECTOMY  2007  . EXPLORATORY LAPAROTOMY  1983   endometriosis  . GANGLION CYST EXCISION Left 1999   Foot  . TOTAL ABDOMINAL HYSTERECTOMY W/ BILATERAL SALPINGOOPHORECTOMY  1984   endometriosis (and appendectomy)    SOCIAL HISTORY: Social History   Tobacco Use  . Smoking status: Never Smoker  . Smokeless tobacco: Never Used  Substance Use Topics  . Alcohol use: No  . Drug use: No    FAMILY HISTORY: Family History  Problem Relation Age of Onset  . Diabetes Father   . Heart attack Father 23  . Heart disease Father   . Hypertension Father   . Peripheral Artery Disease Father        stents at 30  . Diabetes Paternal Grandfather   . Heart disease Paternal Grandfather   . Osteoporosis Maternal Grandmother   . Heart failure Maternal Grandmother   . Thyroid disease Mother        hypothyroidism  . Heart disease Mother 54       CABG at 43  . Hypertension Mother   . Diverticulitis Mother 23       perforation, required surgery  . Pneumonia Sister   . Cancer Maternal Grandfather        lung  . Dementia Paternal Grandmother   . Cancer Paternal Grandmother        melanoma on her foot (not related to death)  . Breast cancer Paternal Aunt        19's  . Cancer Maternal Aunt        lung  . Cancer Maternal Uncle        lung  . Breast cancer Cousin        mets to lung  . Congestive Heart Failure Maternal Uncle   . Breast cancer Cousin      ROS: Review of Systems  Constitutional: Positive for weight loss.  Respiratory: Negative for shortness of breath (on exertion).  Cardiovascular: Negative for chest pain.  Gastrointestinal: Negative for diarrhea, nausea and vomiting.  Musculoskeletal:       Negative for muscle weakness  Endo/Heme/Allergies:       Negative for hypoglycemia    PHYSICAL EXAM: Blood pressure 104/70, pulse 88, temperature 98.5 F (36.9 C), temperature source Oral, height 5\' 3"  (1.6 m), weight 259 lb (117.5 kg), last menstrual period 10/18/1982, SpO2 96 %. Body mass index is 45.88 kg/m. Physical Exam Vitals signs reviewed.  Constitutional:      Appearance: Normal appearance. She is well-developed. She is obese.  Cardiovascular:     Rate and Rhythm: Normal rate.  Pulmonary:     Effort: Pulmonary effort is normal.  Musculoskeletal: Normal range of motion.  Skin:    General: Skin is warm and dry.  Neurological:     Mental Status: She is alert and oriented to person, place, and time.  Psychiatric:        Mood and Affect: Mood normal.        Behavior: Behavior normal.     RECENT LABS AND TESTS: BMET    Component Value Date/Time   NA 138 03/23/2019 0831   NA 139 11/21/2015 0826   K 5.3 (H) 03/23/2019 0831   K 4.7 11/21/2015 0826   CL 103 03/23/2019 0831   CO2 22 03/23/2019 0831   CO2 24 11/21/2015 0826   GLUCOSE 115 (H) 03/23/2019 0831   GLUCOSE 96 09/30/2016 0811   GLUCOSE 125 11/21/2015 0826   BUN 14 03/23/2019 0831   BUN 12.5 11/21/2015 0826   CREATININE 0.90 03/23/2019 0831   CREATININE 0.93 09/30/2016 0811   CREATININE 1.0 11/21/2015 0826   CALCIUM 9.4 03/23/2019 0831   CALCIUM 9.6 11/21/2015 0826   GFRNONAA 69 03/23/2019 0831   GFRAA 80 03/23/2019 0831   Lab Results  Component Value Date   HGBA1C 7.0 (H) 03/23/2019   HGBA1C 6.8 (A) 12/14/2018   HGBA1C 7.0 (H) 07/13/2018   HGBA1C 6.0 (A) 04/13/2018   HGBA1C 6.2 12/07/2017   Lab Results  Component Value Date   INSULIN  40.3 (H) 03/23/2019   INSULIN 59.1 (H) 07/13/2018   CBC    Component Value Date/Time   WBC 5.6 03/23/2019 0831   WBC 7.8 09/30/2016 0811   RBC 4.35 03/23/2019 0831   RBC 4.92 09/30/2016 0811   HGB 13.1 03/23/2019 0831   HGB 15.1 11/21/2015 0826   HCT 40.5 03/23/2019 0831   HCT 46.3 11/21/2015 0826   PLT 94 (LL) 03/23/2019 0831   MCV 93 03/23/2019 0831   MCV 95.1 11/21/2015 0826   MCH 30.1 03/23/2019 0831   MCH 32.3 09/30/2016 0811   MCHC 32.3 03/23/2019 0831   MCHC 33.1 09/30/2016 0811   RDW 13.5 03/23/2019 0831   RDW 15.1 (H) 11/21/2015 0826   LYMPHSABS 2.3 03/23/2019 0831   LYMPHSABS 2.6 11/21/2015 0826   MONOABS 546 09/30/2016 0811   MONOABS 0.7 11/21/2015 0826   EOSABS 0.1 03/23/2019 0831   BASOSABS 0.0 03/23/2019 0831   BASOSABS 0.0 11/21/2015 0826   Iron/TIBC/Ferritin/ %Sat    Component Value Date/Time   IRON 84 12/07/2017 1619   Lipid Panel     Component Value Date/Time   CHOL 120 12/14/2018 0950   TRIG 100 12/14/2018 0950   HDL 58 12/14/2018 0950   CHOLHDL 2.1 12/14/2018 0950   CHOLHDL 2.3 09/30/2016 0811   VLDL 26 09/30/2016 0811   LDLCALC 42 12/14/2018 0950   Hepatic Function Panel  Component Value Date/Time   PROT 6.5 03/23/2019 0831   PROT 7.2 11/21/2015 0826   ALBUMIN 3.5 (L) 03/23/2019 0831   ALBUMIN 3.8 11/21/2015 0826   AST 50 (H) 03/23/2019 0831   AST 24 11/21/2015 0826   ALT 27 03/23/2019 0831   ALT 24 11/21/2015 0826   ALKPHOS 103 03/23/2019 0831   ALKPHOS 75 11/21/2015 0826   BILITOT 0.4 03/23/2019 0831   BILITOT 0.58 11/21/2015 0826      Component Value Date/Time   TSH 2.240 12/14/2018 0950   TSH 2.200 12/07/2017 1619   TSH 1.79 03/31/2016 0859    Results for DHRUVI, CRENSHAW (MRN [id number]) as of 04/18/2019 16:09  Ref. Range 03/23/2019 08:31  Vitamin D, 25-Hydroxy Latest Ref Range: 30.0 - 100.0 ng/mL 41.4    OBESITY BEHAVIORAL INTERVENTION VISIT  Today's visit was # 16   Starting weight: 262 lbs Starting date:  07/13/2018 Today's weight : 259 lbs  Today's date: 04/18/2019 Total lbs lost to date: 3    04/18/2019  Height 5\' 3"  (1.6 m)  Weight 259 lb (117.5 kg)  BMI (Calculated) 45.89  BLOOD PRESSURE - SYSTOLIC 453  BLOOD PRESSURE - DIASTOLIC 70   Body Fat % 64.6 %  Total Body Water (lbs) 85.8 lbs    ASK: We discussed the diagnosis of obesity with Joelyn Oms today and Zamarah agreed to give [name] permission to discuss obesity behavioral modification therapy today.  ASSESS: Sumayyah has the diagnosis of obesity and her BMI today is 45.89 Myana is in the action stage of change   ADVISE: Jaycey was educated on the multiple health risks of obesity as well as the benefit of weight loss to improve her health. She was advised of the need for long term treatment and the importance of lifestyle modifications to improve her current health and to decrease her risk of future health problems.  AGREE: Multiple dietary modification options and treatment options were discussed and  Lianny agreed to follow the recommendations documented in the above note.  ARRANGE: Javana was educated on the importance of frequent visits to treat obesity as outlined per CMS and USPSTF guidelines and agreed to schedule her next follow up appointment today.  Corey Skains, am acting as Location manager for [employer]. Owens Shark, Do  I have reviewed the above documentation for accuracy and completeness, and I agree with the above. -Jearld Lesch, DO

## 2019-04-30 MED FILL — ACCU-CHEK FASTCLIX LANCETS: 51 days supply | Qty: 102 | Fill #1

## 2019-05-02 ENCOUNTER — Encounter (INDEPENDENT_AMBULATORY_CARE_PROVIDER_SITE_OTHER): Payer: Self-pay | Admitting: Bariatrics

## 2019-05-02 ENCOUNTER — Ambulatory Visit (INDEPENDENT_AMBULATORY_CARE_PROVIDER_SITE_OTHER): Payer: No Typology Code available for payment source | Admitting: Bariatrics

## 2019-05-02 ENCOUNTER — Other Ambulatory Visit: Payer: Self-pay

## 2019-05-02 VITALS — BP 118/77 | HR 78 | Temp 97.9°F | Ht 63.0 in | Wt 262.0 lb

## 2019-05-02 DIAGNOSIS — E119 Type 2 diabetes mellitus without complications: Secondary | ICD-10-CM | POA: Diagnosis not present

## 2019-05-02 DIAGNOSIS — Z6841 Body Mass Index (BMI) 40.0 and over, adult: Secondary | ICD-10-CM

## 2019-05-02 DIAGNOSIS — E559 Vitamin D deficiency, unspecified: Secondary | ICD-10-CM | POA: Diagnosis not present

## 2019-05-02 NOTE — Progress Notes (Signed)
Office: [phone]  /  Fax: [phone]   HPI:   Chief Complaint: OBESITY Cindy Carey is here to discuss her progress with her obesity treatment plan. She is on the Pescatarian eating plan and is following her eating plan approximately 75 % of the time. She states she is walking for 30 minutes 5 times per week. Cindy Carey is doing well overall, but she has gained 3 lbs since her last visit. She is drinking adequate water. She is getting adequate protein.  Her weight is 262 lb (118.8 kg) today and has gained 3 lbs since her last visit. She has lost 0 lbs since starting treatment with [name].  Diabetes II Cindy Carey has a diagnosis of diabetes type II. Cindy Carey is taking Jardiance, metformin, and Trulicity. She states her fasting BGs range between 90 and 100. She denies hypoglycemia. Last A1c was 7.0 and insulin of 40.3. She has been working on intensive lifestyle modifications including diet, exercise, and weight loss to help control her blood glucose levels.  Vitamin D Deficiency Cindy Carey has a diagnosis of vitamin D deficiency. She is currently taking OTC Vit D and denies nausea, vomiting or muscle weakness.  ASSESSMENT AND PLAN:  Type 2 diabetes mellitus without complication, without long-term current use of insulin (HCC)  Vitamin D deficiency  Class 3 severe obesity with serious comorbidity and body mass index (BMI) of 45.0 to 49.9 in adult, unspecified obesity type ([place])  PLAN:  Diabetes II Cindy Carey has been given extensive diabetes education by myself today including ideal fasting and post-prandial blood glucose readings, individual ideal Hgb A1c goals and hypoglycemia prevention. We discussed the importance of good blood sugar control to decrease the likelihood of diabetic complications such as nephropathy, neuropathy, limb loss, blindness, coronary artery disease, and death. We discussed the importance of intensive lifestyle modification including diet, exercise and weight loss as the first line treatment for  diabetes. Melany agrees to continue her diabetes medications, and she agrees to follow up with our clinic in 2 weeks.  Vitamin D Deficiency Cindy Carey was informed that low vitamin D levels contributes to fatigue and are associated with obesity, breast, and colon cancer. Cindy Carey agrees to continue taking OTC Vit D and will follow up for routine testing of vitamin D, at least 2-3 times per year. She was informed of the risk of over-replacement of vitamin D and agrees to not increase her dose unless she discusses this with [name] first. Cindy Carey agrees to follow up with our clinic in 2 weeks.  I spent > than 50% of the 15 minute visit on counseling as documented in the note.  Obesity Cindy Carey is currently in the action stage of change. As such, her goal is to continue with weight loss efforts She has agreed to follow the [place] eating plan Cindy Carey has been instructed to work up to a goal of 150 minutes of combined cardio and strengthening exercise per week for weight loss and overall health benefits. We discussed the following Behavioral Modification Strategies today: increasing lean protein intake, decreasing simple carbohydrates, increasing vegetables, increase H20 intake, decrease eating out, no skipping meals, work on meal planning and easy cooking plans, and keeping healthy foods in the home We will repeat IC before her next visit. Recommendation book: Pescatarian diet cookbook, by Dr. Pearlie Oyster.  Jordann has agreed to follow up with our clinic in 2 weeks. She was informed of the importance of frequent follow up visits to maximize her success with intensive lifestyle modifications for her multiple health conditions.  ALLERGIES: Allergies  Allergen Reactions   Bee Venom Anaphylaxis   Shellfish Allergy Anaphylaxis   Invokana [Canagliflozin] Itching   Penicillins Hives    MEDICATIONS: Current Outpatient Medications on File Prior to Visit  Medication Sig Dispense Refill   Accu-Chek FastClix Lancets  MISC USE TO CHECK BLOOD SUGAR TWICE A DAY AS DIRECTED 102 each 3   ACCU-CHEK GUIDE test strip USE TO CHECK BLOOD SUGAR TWICE A DAY AS DIRECTED 100 each 1   ALPRAZolam (XANAX) 0.25 MG tablet Take 1-2 tablets (0.25-0.5 mg total) by mouth at bedtime as needed for anxiety or sleep. 15 tablet 0   aspirin EC 81 MG tablet Take 81 mg by mouth daily.     Calcium Carbonate-Vit D-Min (CALCIUM 600+D PLUS MINERALS) 600-400 MG-UNIT TABS Take 1 tablet by mouth 2 (two) times daily.     cyclobenzaprine (FLEXERIL) 10 MG tablet Take 1 tablet (10 mg total) by mouth 3 (three) times daily as needed for muscle spasms. 30 tablet 0   empagliflozin (JARDIANCE) 10 MG TABS tablet Take 10 mg by mouth daily with breakfast. 90 tablet 0   EPINEPHrine 0.3 mg/0.3 mL IJ SOAJ injection Inject 0.3 mLs (0.3 mg total) into the muscle once as needed. Reported on 04/30/2016 1 Device 0   lisinopril (PRINIVIL,ZESTRIL) 10 MG tablet TAKE 1 TABLET (10 MG TOTAL) BY MOUTH DAILY. 90 tablet 0   Magnesium 250 MG TABS Take 250 mg by mouth daily.     METRONIDAZOLE, TOPICAL, 0.75 % LOTN Apply 1 application topically 2 (two) times daily. 59 mL 12   Multiple Vitamins-Minerals (MULTIVITAMIN PO) Take 1 tablet by mouth daily. Reported on 03/31/2016     promethazine (PHENERGAN) 25 MG tablet Take 1 tablet (25 mg total) by mouth every 8 (eight) hours as needed for nausea. 30 tablet 0   simvastatin (ZOCOR) 40 MG tablet Take 1 tablet (40 mg total) by mouth every evening. 90 tablet 1   tamoxifen (NOLVADEX) 20 MG tablet Take 1 tablet (20 mg total) by mouth daily. 90 tablet 3   TRULICITY 1.5 CW/2.3JS SOPN INJECT 1.5 MG INTO THE SKIN EVERY 7 DAYS. 6 mL 2   metFORMIN (GLUCOPHAGE-XR) 500 MG 24 hr tablet Take 2 tablets (1,000 mg total) by mouth 2 (two) times daily. 360 tablet 1   No current facility-administered medications on file prior to visit.     PAST MEDICAL HISTORY: Past Medical History:  Diagnosis Date   Anemia    Asthma    Blood  transfusion without reported diagnosis 1983   Diabetes mellitus without complication ([place]) 2831   T2DM   Dyspareunia    Exercise-induced asthma    worse in winter   H/O bone density study 2013   H/O cold sores    H/O colonoscopy 2009   History of endometriosis    Hyperlipidemia    Hypertension    Injury of tendon of rotator cuff 2015   gym injury (right)   Knee pain 08/2015   L>R   Migraine with typical aura    Mild CAD 04/04/2018   Mild disease on coronary CT-A 01/2018.   Morbid obesity ([place]) 09/04/12   BMI 49.4 kg/m^2    Osteoarthritis    Plantar fasciitis, left 2019   Trigger finger of left thumb 08/2018   injected by Dr. Fredna Dow 09/18/18    PAST SURGICAL HISTORY: Past Surgical History:  Procedure Laterality Date   BREAST BIOPSY  2010   BREAST LUMPECTOMY  2010   BREAST LUMPECTOMY WITH RADIOACTIVE  SEED LOCALIZATION Left 07/23/2015   Procedure: LEFT BREAST SEED GUIDED EXCISION;  Surgeon: Rolm Bookbinder, MD;  Location: [facility];  Service: General;  Laterality: Left;   BREATH TEK H PYLORI  08/28/2012   Procedure: BREATH TEK H PYLORI;  Surgeon: Pedro Earls, MD;  Location: [facility];  Service: General;  Laterality: N/A;   CHOLECYSTECTOMY  2007   EXPLORATORY LAPAROTOMY  1983   endometriosis   GANGLION CYST EXCISION Left 1999   Foot   TOTAL ABDOMINAL HYSTERECTOMY W/ BILATERAL SALPINGOOPHORECTOMY  1984   endometriosis (and appendectomy)    SOCIAL HISTORY: Social History   Tobacco Use   Smoking status: Never Smoker   Smokeless tobacco: Never Used  Substance Use Topics   Alcohol use: No   Drug use: No    FAMILY HISTORY: Family History  Problem Relation Age of Onset   Diabetes Father    Heart attack Father 69   Heart disease Father    Hypertension Father    Peripheral Artery Disease Father        stents at 59   Diabetes Paternal Grandfather    Heart disease Paternal Grandfather    Osteoporosis  Maternal Grandmother    Heart failure Maternal Grandmother    Thyroid disease Mother        hypothyroidism   Heart disease Mother 65       CABG at 25   Hypertension Mother    Diverticulitis Mother 105       perforation, required surgery   Pneumonia Sister    Cancer Maternal Grandfather        lung   Dementia Paternal Grandmother    Cancer Paternal Grandmother        melanoma on her foot (not related to death)   Breast cancer Paternal Aunt        41's   Cancer Maternal Aunt        lung   Cancer Maternal Uncle        lung   Breast cancer Cousin        mets to lung   Congestive Heart Failure Maternal Uncle    Breast cancer Cousin     ROS: Review of Systems  Constitutional: Negative for weight loss.  Gastrointestinal: Negative for nausea and vomiting.  Musculoskeletal:       Negative muscle weakness  Endo/Heme/Allergies:       Negative hypoglycemia    PHYSICAL EXAM: Blood pressure 118/77, pulse 78, temperature 97.9 F (36.6 C), temperature source Oral, height 5\' 3"  (1.6 m), weight 262 lb (118.8 kg), last menstrual period 10/18/1982, SpO2 97 %. Body mass index is 46.41 kg/m. Physical Exam Vitals signs reviewed.  Constitutional:      Appearance: Normal appearance. She is obese.  Cardiovascular:     Rate and Rhythm: Normal rate.     Pulses: Normal pulses.  Pulmonary:     Effort: Pulmonary effort is normal.     Breath sounds: Normal breath sounds.  Musculoskeletal: Normal range of motion.  Skin:    General: Skin is warm and dry.  Neurological:     Mental Status: She is alert and oriented to person, place, and time.  Psychiatric:        Mood and Affect: Mood normal.        Behavior: Behavior normal.     RECENT LABS AND TESTS: BMET    Component Value Date/Time   NA 138 03/23/2019 0831   NA 139 11/21/2015 0826  K 5.3 (H) 03/23/2019 0831   K 4.7 11/21/2015 0826   CL 103 03/23/2019 0831   CO2 22 03/23/2019 0831   CO2 24 11/21/2015 0826    GLUCOSE 115 (H) 03/23/2019 0831   GLUCOSE 96 09/30/2016 0811   GLUCOSE 125 11/21/2015 0826   BUN 14 03/23/2019 0831   BUN 12.5 11/21/2015 0826   CREATININE 0.90 03/23/2019 0831   CREATININE 0.93 09/30/2016 0811   CREATININE 1.0 11/21/2015 0826   CALCIUM 9.4 03/23/2019 0831   CALCIUM 9.6 11/21/2015 0826   GFRNONAA 69 03/23/2019 0831   GFRAA 80 03/23/2019 0831   Lab Results  Component Value Date   HGBA1C 7.0 (H) 03/23/2019   HGBA1C 6.8 (A) 12/14/2018   HGBA1C 7.0 (H) 07/13/2018   HGBA1C 6.0 (A) 04/13/2018   HGBA1C 6.2 12/07/2017   Lab Results  Component Value Date   INSULIN 40.3 (H) 03/23/2019   INSULIN 59.1 (H) 07/13/2018   CBC    Component Value Date/Time   WBC 5.6 03/23/2019 0831   WBC 7.8 09/30/2016 0811   RBC 4.35 03/23/2019 0831   RBC 4.92 09/30/2016 0811   HGB 13.1 03/23/2019 0831   HGB 15.1 11/21/2015 0826   HCT 40.5 03/23/2019 0831   HCT 46.3 11/21/2015 0826   PLT 94 (LL) 03/23/2019 0831   MCV 93 03/23/2019 0831   MCV 95.1 11/21/2015 0826   MCH 30.1 03/23/2019 0831   MCH 32.3 09/30/2016 0811   MCHC 32.3 03/23/2019 0831   MCHC 33.1 09/30/2016 0811   RDW 13.5 03/23/2019 0831   RDW 15.1 (H) 11/21/2015 0826   LYMPHSABS 2.3 03/23/2019 0831   LYMPHSABS 2.6 11/21/2015 0826   MONOABS 546 09/30/2016 0811   MONOABS 0.7 11/21/2015 0826   EOSABS 0.1 03/23/2019 0831   BASOSABS 0.0 03/23/2019 0831   BASOSABS 0.0 11/21/2015 0826   Iron/TIBC/Ferritin/ %Sat    Component Value Date/Time   IRON 84 12/07/2017 1619   Lipid Panel     Component Value Date/Time   CHOL 120 12/14/2018 0950   TRIG 100 12/14/2018 0950   HDL 58 12/14/2018 0950   CHOLHDL 2.1 12/14/2018 0950   CHOLHDL 2.3 09/30/2016 0811   VLDL 26 09/30/2016 0811   LDLCALC 42 12/14/2018 0950   Hepatic Function Panel     Component Value Date/Time   PROT 6.5 03/23/2019 0831   PROT 7.2 11/21/2015 0826   ALBUMIN 3.5 (L) 03/23/2019 0831   ALBUMIN 3.8 11/21/2015 0826   AST 50 (H) 03/23/2019 0831   AST  24 11/21/2015 0826   ALT 27 03/23/2019 0831   ALT 24 11/21/2015 0826   ALKPHOS 103 03/23/2019 0831   ALKPHOS 75 11/21/2015 0826   BILITOT 0.4 03/23/2019 0831   BILITOT 0.58 11/21/2015 0826      Component Value Date/Time   TSH 2.240 12/14/2018 0950   TSH 2.200 12/07/2017 1619   TSH 1.79 03/31/2016 0859      OBESITY BEHAVIORAL INTERVENTION VISIT  Today's visit was # 17   Starting weight: 262 lbs Starting date: 07/13/18 Today's weight : 262 lbs Today's date: 05/02/2019 Total lbs lost to date: 0    ASK: We discussed the diagnosis of obesity with Joelyn Oms today and Lilymarie agreed to give [name] permission to discuss obesity behavioral modification therapy today.  ASSESS: Thanh has the diagnosis of obesity and her BMI today is 25.42 Meshawn is in the action stage of change   ADVISE: Milagros was educated on the multiple health risks of obesity as  well as the benefit of weight loss to improve her health. She was advised of the need for long term treatment and the importance of lifestyle modifications to improve her current health and to decrease her risk of future health problems.  AGREE: Multiple dietary modification options and treatment options were discussed and  Azalee agreed to follow the recommendations documented in the above note.  ARRANGE: Chaney was educated on the importance of frequent visits to treat obesity as outlined per CMS and USPSTF guidelines and agreed to schedule her next follow up appointment today.  Wilhemena Durie, am acting as transcriptionist for [employer], DO   I have reviewed the above documentation for accuracy and completeness, and I agree with the above. -Jearld Lesch, DO

## 2019-05-14 ENCOUNTER — Other Ambulatory Visit: Payer: Self-pay | Admitting: Family Medicine

## 2019-05-14 ENCOUNTER — Encounter: Payer: Self-pay | Admitting: Family Medicine

## 2019-05-14 DIAGNOSIS — N6092 Unspecified benign mammary dysplasia of left breast: Secondary | ICD-10-CM

## 2019-05-14 DIAGNOSIS — I10 Essential (primary) hypertension: Secondary | ICD-10-CM

## 2019-05-14 MED ORDER — TAMOXIFEN CITRATE 20 MG PO TABS: 20.0000 mg | ORAL_TABLET | Freq: Every day | ORAL | 3 refills | Status: DC

## 2019-05-14 MED FILL — LISINOPRIL 10 MG TABS: 10 | 90 days supply | Qty: 90 | Fill #0

## 2019-05-14 MED FILL — TAMOXIFEN CITRATE 20 MG TAB: 20 | 90 days supply | Qty: 90 | Fill #0

## 2019-05-21 ENCOUNTER — Ambulatory Visit (INDEPENDENT_AMBULATORY_CARE_PROVIDER_SITE_OTHER): Payer: No Typology Code available for payment source | Admitting: Bariatrics

## 2019-05-21 ENCOUNTER — Other Ambulatory Visit: Payer: Self-pay

## 2019-05-21 ENCOUNTER — Encounter (INDEPENDENT_AMBULATORY_CARE_PROVIDER_SITE_OTHER): Payer: Self-pay | Admitting: Bariatrics

## 2019-05-21 VITALS — BP 121/75 | HR 73 | Temp 97.7°F | Ht 63.0 in | Wt 260.0 lb

## 2019-05-21 DIAGNOSIS — E559 Vitamin D deficiency, unspecified: Secondary | ICD-10-CM | POA: Diagnosis not present

## 2019-05-21 DIAGNOSIS — R0602 Shortness of breath: Secondary | ICD-10-CM

## 2019-05-21 DIAGNOSIS — E119 Type 2 diabetes mellitus without complications: Secondary | ICD-10-CM | POA: Diagnosis not present

## 2019-05-21 DIAGNOSIS — R5383 Other fatigue: Secondary | ICD-10-CM | POA: Diagnosis not present

## 2019-05-21 DIAGNOSIS — Z6841 Body Mass Index (BMI) 40.0 and over, adult: Secondary | ICD-10-CM

## 2019-05-22 ENCOUNTER — Encounter (INDEPENDENT_AMBULATORY_CARE_PROVIDER_SITE_OTHER): Payer: Self-pay | Admitting: Bariatrics

## 2019-05-22 NOTE — Progress Notes (Signed)
Office: [phone]  /  Fax: [phone]   HPI:   Chief Complaint: OBESITY Cindy Carey is here to discuss her progress with her obesity treatment plan. She is on the Pescatarian eating plan and is following her eating plan approximately 90 % of the time. She states she is walking 30 minutes 5 times per week. Cindy Carey is down two pounds. She will have a new indirect calorimetry done today, secondary to lack of weight loss. She is having 1300 to 1400 calories. She is not as active. Cindy Carey is getting adequate water. Her weight is 260 lb (117.9 kg) today and has had a weight loss of 2 pounds over a period of 3 weeks since her last visit. She has lost 2 lbs since starting treatment with [name].  Fatigue Cindy Carey feels her energy is lower than it should be. This has worsened with weight gain and has not worsened recently. Indirect Calorimetry will be ordered today.  Dyspnea with Exercise Cindy Carey notes increasing shortness of breath with certain activities and seems to be worsening over time with weight gain. She notes getting out of breath sooner with activity than she used to. This has not gotten worse recently. Indirect Calorimetry will be ordered today.  Diabetes II without complications Cindy Carey has a diagnosis of diabetes type II. Cindy Carey is taking Jardiance and Metformin, and she denies any GI side effects. Cindy Carey states fasting BGs range between 90and 100's. Last A1c was at 7.0 She has been working on intensive lifestyle modifications including diet, exercise, and weight loss to help control her blood glucose levels.  Vitamin D deficiency Cindy Carey has a diagnosis of vitamin D deficiency. She is currently taking OTC vit D and denies nausea, vomiting or muscle weakness.  ASSESSMENT AND PLAN:  Other fatigue  Shortness of breath on exertion  Type 2 diabetes mellitus without complication, without long-term current use of insulin (HCC)  Vitamin D deficiency  Class 3 severe obesity with serious comorbidity and body mass  index (BMI) of 45.0 to 49.9 in adult, unspecified obesity type ([place])  PLAN:  Fatigue Cindy Carey was informed that her fatigue may be related to obesity, depression or many other causes. Cindy Carey has agreed to work on diet, exercise and weight loss to help with fatigue.   Dyspnea with exercise Cindy Carey's shortness of breath appears to be obesity related and exercise induced. The indirect calorimeter results showed VO2 of 263 and a REE of 1828. Her REE was at 1737 on 07/13/18. She will continue to work on weight loss and gradually increase exercise over time to treat her exercise induced shortness of breath. If Cindy Carey follows our instructions and loses weight without improvement of her shortness of breath, we will plan to refer to pulmonology. Cindy Carey agrees to this plan.  Diabetes II without complications Cindy Carey has been given extensive diabetes education by myself today including ideal fasting and post-prandial blood glucose readings, individual ideal Hgb A1c goals and hypoglycemia prevention. We discussed the importance of good blood sugar control to decrease the likelihood of diabetic complications such as nephropathy, neuropathy, limb loss, blindness, coronary artery disease, and death. We discussed the importance of intensive lifestyle modification including diet, exercise and weight loss as the first line treatment for diabetes. Cindy Carey agrees to continue her diabetes medications and she will follow up at the agreed upon time.  Vitamin D Deficiency Cindy Carey was informed that low vitamin D levels contributes to fatigue and are associated with obesity, breast, and colon cancer. Cindy Carey will continue to take OTC Vit D  and she will follow up for routine testing of vitamin D, at least 2-3 times per year. She was informed of the risk of over-replacement of vitamin D and agrees to not increase her dose unless she discusses this with [name] first.  I spent > than 50% of the 25 minute visit on counseling as documented in the note.   Obesity Cindy Carey is currently in the action stage of change. As such, her goal is to continue with weight loss efforts She has agreed to follow the Pescatarian eating plan Cindy Carey will continue walking and she will increase walking at work for weight loss and overall health benefits. Cindy Carey will begin small weights and resistance exercise. We discussed the following Behavioral Modification Strategies today: consistent 1300 to 1400 calories, increase H2O intake, no skipping meals, keeping healthy foods in the home, increasing Cindy Carey protein intake, decreasing simple carbohydrates, increasing vegetables, decrease eating out and work on meal planning and intentional eating We discussed indirect calorimetry results today.  Cindy Carey has agreed to follow up with our clinic in 2 weeks. She was informed of the importance of frequent follow up visits to maximize her success with intensive lifestyle modifications for her multiple health conditions.  ALLERGIES: Allergies  Allergen Reactions  . Bee Venom Anaphylaxis  . Shellfish Allergy Anaphylaxis  . Invokana [Canagliflozin] Itching  . Penicillins Hives    MEDICATIONS: Current Outpatient Medications on File Prior to Visit  Medication Sig Dispense Refill  . Accu-Chek FastClix Lancets MISC USE TO CHECK BLOOD SUGAR TWICE A DAY AS DIRECTED 102 each 3  . ACCU-CHEK GUIDE test strip USE TO CHECK BLOOD SUGAR TWICE A DAY AS DIRECTED 100 each 1  . ALPRAZolam (XANAX) 0.25 MG tablet Take 1-2 tablets (0.25-0.5 mg total) by mouth at bedtime as needed for anxiety or sleep. 15 tablet 0  . aspirin EC 81 MG tablet Take 81 mg by mouth daily.    . Calcium Carbonate-Vit D-Min (CALCIUM 600+D PLUS MINERALS) 600-400 MG-UNIT TABS Take 1 tablet by mouth 2 (two) times daily.    . cyclobenzaprine (FLEXERIL) 10 MG tablet Take 1 tablet (10 mg total) by mouth 3 (three) times daily as needed for muscle spasms. 30 tablet 0  . empagliflozin (JARDIANCE) 10 MG TABS tablet Take 10 mg by mouth daily  with breakfast. 90 tablet 0  . EPINEPHrine 0.3 mg/0.3 mL IJ SOAJ injection Inject 0.3 mLs (0.3 mg total) into the muscle once as needed. Reported on 04/30/2016 1 Device 0  . lisinopril (ZESTRIL) 10 MG tablet TAKE 1 TABLET BY MOUTH DAILY. 90 tablet 0  . Magnesium 250 MG TABS Take 250 mg by mouth daily.    [name] METRONIDAZOLE, TOPICAL, 0.75 % LOTN Apply 1 application topically 2 (two) times daily. 59 mL 12  . Multiple Vitamins-Minerals (MULTIVITAMIN PO) Take 1 tablet by mouth daily. Reported on 03/31/2016    . promethazine (PHENERGAN) 25 MG tablet Take 1 tablet (25 mg total) by mouth every 8 (eight) hours as needed for nausea. 30 tablet 0  . simvastatin (ZOCOR) 40 MG tablet Take 1 tablet (40 mg total) by mouth every evening. 90 tablet 1  . tamoxifen (NOLVADEX) 20 MG tablet Take 1 tablet (20 mg total) by mouth daily. 90 tablet 3  . TRULICITY 1.5 PJ/0.3PR SOPN INJECT 1.5 MG INTO THE SKIN EVERY 7 DAYS. 6 mL 2  . metFORMIN (GLUCOPHAGE-XR) 500 MG 24 hr tablet Take 2 tablets (1,000 mg total) by mouth 2 (two) times daily. 360 tablet 1   No current  facility-administered medications on file prior to visit.     PAST MEDICAL HISTORY: Past Medical History:  Diagnosis Date  . Anemia   . Asthma   . Blood transfusion without reported diagnosis 1983  . Diabetes mellitus without complication ([place]) 5462   T2DM  . Dyspareunia   . Exercise-induced asthma    worse in winter  . H/O bone density study 2013  . H/O cold sores   . H/O colonoscopy 2009  . History of endometriosis   . Hyperlipidemia   . Hypertension   . Injury of tendon of rotator cuff 2015   gym injury (right)  . Knee pain 08/2015   L>R  . Migraine with typical aura   . Mild CAD 04/04/2018   Mild disease on coronary CT-A 01/2018.  . Morbid obesity ([place]) 09/04/12   BMI 49.4 kg/m^2   . Osteoarthritis   . Plantar fasciitis, left 2019  . Trigger finger of left thumb 08/2018   injected by Dr. Fredna Dow 09/18/18    PAST SURGICAL HISTORY: Past  Surgical History:  Procedure Laterality Date  . BREAST BIOPSY  2010  . BREAST LUMPECTOMY  2010  . BREAST LUMPECTOMY WITH RADIOACTIVE SEED LOCALIZATION Left 07/23/2015   Procedure: LEFT BREAST SEED GUIDED EXCISION;  Surgeon: Rolm Bookbinder, MD;  Location: [facility];  Service: General;  Laterality: Left;  . BREATH TEK H PYLORI  08/28/2012   Procedure: BREATH TEK H PYLORI;  Surgeon: Pedro Earls, MD;  Location: [facility];  Service: General;  Laterality: N/A;  . CHOLECYSTECTOMY  2007  . EXPLORATORY LAPAROTOMY  1983   endometriosis  . GANGLION CYST EXCISION Left 1999   Foot  . TOTAL ABDOMINAL HYSTERECTOMY W/ BILATERAL SALPINGOOPHORECTOMY  1984   endometriosis (and appendectomy)    SOCIAL HISTORY: Social History   Tobacco Use  . Smoking status: Never Smoker  . Smokeless tobacco: Never Used  Substance Use Topics  . Alcohol use: No  . Drug use: No    FAMILY HISTORY: Family History  Problem Relation Age of Onset  . Diabetes Father   . Heart attack Father 51  . Heart disease Father   . Hypertension Father   . Peripheral Artery Disease Father        stents at 98  . Diabetes Paternal Grandfather   . Heart disease Paternal Grandfather   . Osteoporosis Maternal Grandmother   . Heart failure Maternal Grandmother   . Thyroid disease Mother        hypothyroidism  . Heart disease Mother 40       CABG at 57  . Hypertension Mother   . Diverticulitis Mother 20       perforation, required surgery  . Pneumonia Sister   . Cancer Maternal Grandfather        lung  . Dementia Paternal Grandmother   . Cancer Paternal Grandmother        melanoma on her foot (not related to death)  . Breast cancer Paternal Aunt        20's  . Cancer Maternal Aunt        lung  . Cancer Maternal Uncle        lung  . Breast cancer Cousin        mets to lung  . Congestive Heart Failure Maternal Uncle   . Breast cancer Cousin     ROS: Review of Systems  Constitutional:  Positive for malaise/fatigue and weight loss.  Respiratory: Positive for shortness of breath (  on exertion).   Gastrointestinal: Negative for diarrhea, nausea and vomiting.  Musculoskeletal:       Negative for muscle weakness    PHYSICAL EXAM: Blood pressure 121/75, pulse 73, temperature 97.7 F (36.5 C), temperature source Oral, height 5\' 3"  (1.6 m), weight 260 lb (117.9 kg), last menstrual period 10/18/1982, SpO2 96 %. Body mass index is 46.06 kg/m. Physical Exam Vitals signs reviewed.  Constitutional:      Appearance: Normal appearance. She is well-developed. She is obese.  Cardiovascular:     Rate and Rhythm: Normal rate.  Pulmonary:     Effort: Pulmonary effort is normal.  Musculoskeletal: Normal range of motion.  Skin:    General: Skin is warm and dry.  Neurological:     Mental Status: She is alert and oriented to person, place, and time.  Psychiatric:        Mood and Affect: Mood normal.        Behavior: Behavior normal.     RECENT LABS AND TESTS: BMET    Component Value Date/Time   NA 138 03/23/2019 0831   NA 139 11/21/2015 0826   K 5.3 (H) 03/23/2019 0831   K 4.7 11/21/2015 0826   CL 103 03/23/2019 0831   CO2 22 03/23/2019 0831   CO2 24 11/21/2015 0826   GLUCOSE 115 (H) 03/23/2019 0831   GLUCOSE 96 09/30/2016 0811   GLUCOSE 125 11/21/2015 0826   BUN 14 03/23/2019 0831   BUN 12.5 11/21/2015 0826   CREATININE 0.90 03/23/2019 0831   CREATININE 0.93 09/30/2016 0811   CREATININE 1.0 11/21/2015 0826   CALCIUM 9.4 03/23/2019 0831   CALCIUM 9.6 11/21/2015 0826   GFRNONAA 69 03/23/2019 0831   GFRAA 80 03/23/2019 0831   Lab Results  Component Value Date   HGBA1C 7.0 (H) 03/23/2019   HGBA1C 6.8 (A) 12/14/2018   HGBA1C 7.0 (H) 07/13/2018   HGBA1C 6.0 (A) 04/13/2018   HGBA1C 6.2 12/07/2017   Lab Results  Component Value Date   INSULIN 40.3 (H) 03/23/2019   INSULIN 59.1 (H) 07/13/2018   CBC    Component Value Date/Time   WBC 5.6 03/23/2019 0831   WBC  7.8 09/30/2016 0811   RBC 4.35 03/23/2019 0831   RBC 4.92 09/30/2016 0811   HGB 13.1 03/23/2019 0831   HGB 15.1 11/21/2015 0826   HCT 40.5 03/23/2019 0831   HCT 46.3 11/21/2015 0826   PLT 94 (LL) 03/23/2019 0831   MCV 93 03/23/2019 0831   MCV 95.1 11/21/2015 0826   MCH 30.1 03/23/2019 0831   MCH 32.3 09/30/2016 0811   MCHC 32.3 03/23/2019 0831   MCHC 33.1 09/30/2016 0811   RDW 13.5 03/23/2019 0831   RDW 15.1 (H) 11/21/2015 0826   LYMPHSABS 2.3 03/23/2019 0831   LYMPHSABS 2.6 11/21/2015 0826   MONOABS 546 09/30/2016 0811   MONOABS 0.7 11/21/2015 0826   EOSABS 0.1 03/23/2019 0831   BASOSABS 0.0 03/23/2019 0831   BASOSABS 0.0 11/21/2015 0826   Iron/TIBC/Ferritin/ %Sat    Component Value Date/Time   IRON 84 12/07/2017 1619   Lipid Panel     Component Value Date/Time   CHOL 120 12/14/2018 0950   TRIG 100 12/14/2018 0950   HDL 58 12/14/2018 0950   CHOLHDL 2.1 12/14/2018 0950   CHOLHDL 2.3 09/30/2016 0811   VLDL 26 09/30/2016 0811   LDLCALC 42 12/14/2018 0950   Hepatic Function Panel     Component Value Date/Time   PROT 6.5 03/23/2019 0831   PROT  7.2 11/21/2015 0826   ALBUMIN 3.5 (L) 03/23/2019 0831   ALBUMIN 3.8 11/21/2015 0826   AST 50 (H) 03/23/2019 0831   AST 24 11/21/2015 0826   ALT 27 03/23/2019 0831   ALT 24 11/21/2015 0826   ALKPHOS 103 03/23/2019 0831   ALKPHOS 75 11/21/2015 0826   BILITOT 0.4 03/23/2019 0831   BILITOT 0.58 11/21/2015 0826      Component Value Date/Time   TSH 2.240 12/14/2018 0950   TSH 2.200 12/07/2017 1619   TSH 1.79 03/31/2016 0859     Ref. Range 03/23/2019 08:31  Vitamin D, 25-Hydroxy Latest Ref Range: 30.0 - 100.0 ng/mL 41.4    OBESITY BEHAVIORAL INTERVENTION VISIT  Today's visit was # 18   Starting weight: 262 lbs Starting date: 07/13/2018 Today's weight : 260 lbs Today's date: 05/21/2019 Total lbs lost to date: 2    05/21/2019  Height 5\' 3"  (1.6 m)  Weight 260 lb (117.9 kg)  BMI (Calculated) 46.07  BLOOD PRESSURE -  SYSTOLIC 789  BLOOD PRESSURE - DIASTOLIC 75   Body Fat % 52 %  Total Body Water (lbs) 88 lbs  RMR 1828    ASK: We discussed the diagnosis of obesity with Joelyn Oms today and Dreanna agreed to give [name] permission to discuss obesity behavioral modification therapy today.  ASSESS: Tian has the diagnosis of obesity and her BMI today is 46.07 Taisha is in the action stage of change   ADVISE: Odelle was educated on the multiple health risks of obesity as well as the benefit of weight loss to improve her health. She was advised of the need for long term treatment and the importance of lifestyle modifications to improve her current health and to decrease her risk of future health problems.  AGREE: Multiple dietary modification options and treatment options were discussed and  Cindy Carey agreed to follow the recommendations documented in the above note.  ARRANGE: Cindy Carey was educated on the importance of frequent visits to treat obesity as outlined per CMS and USPSTF guidelines and agreed to schedule her next follow up appointment today.  Corey Skains, am acting as Location manager for [employer]. Owens Shark, DO  I have reviewed the above documentation for accuracy and completeness, and I agree with the above. -Jearld Lesch, DO

## 2019-06-01 ENCOUNTER — Other Ambulatory Visit: Payer: Self-pay | Admitting: Family Medicine

## 2019-06-01 DIAGNOSIS — Z1231 Encounter for screening mammogram for malignant neoplasm of breast: Secondary | ICD-10-CM

## 2019-06-04 ENCOUNTER — Encounter (INDEPENDENT_AMBULATORY_CARE_PROVIDER_SITE_OTHER): Payer: Self-pay | Admitting: Bariatrics

## 2019-06-04 MED FILL — JARDIANCE 10 MG TABLET: 10 | 30 days supply | Qty: 30 | Fill #1

## 2019-06-04 MED FILL — ACCU-CHEK GUIDE TEST STRIP: 50 days supply | Qty: 100 | Fill #1

## 2019-06-04 NOTE — Telephone Encounter (Signed)
Please address

## 2019-06-05 ENCOUNTER — Encounter (INDEPENDENT_AMBULATORY_CARE_PROVIDER_SITE_OTHER): Payer: Self-pay | Admitting: Bariatrics

## 2019-06-05 ENCOUNTER — Other Ambulatory Visit: Payer: Self-pay

## 2019-06-05 ENCOUNTER — Telehealth (INDEPENDENT_AMBULATORY_CARE_PROVIDER_SITE_OTHER): Payer: No Typology Code available for payment source | Admitting: Bariatrics

## 2019-06-05 DIAGNOSIS — I10 Essential (primary) hypertension: Secondary | ICD-10-CM

## 2019-06-05 DIAGNOSIS — Z6841 Body Mass Index (BMI) 40.0 and over, adult: Secondary | ICD-10-CM | POA: Diagnosis not present

## 2019-06-05 DIAGNOSIS — E119 Type 2 diabetes mellitus without complications: Secondary | ICD-10-CM | POA: Diagnosis not present

## 2019-06-06 ENCOUNTER — Ambulatory Visit (INDEPENDENT_AMBULATORY_CARE_PROVIDER_SITE_OTHER): Payer: No Typology Code available for payment source | Admitting: Bariatrics

## 2019-06-06 NOTE — Progress Notes (Signed)
Office: [phone]  /  Fax: [phone] TeleHealth Visit:  Cindy Carey has verbally consented to this TeleHealth visit today. The patient is located at work, the provider is located at the [employer] [facility] office. The participants in this visit include the listed provider and patient and any and all parties involved. The visit was conducted today via WebEx.  HPI:   Chief Complaint: OBESITY Cindy Carey is here to discuss her progress with her obesity treatment plan. She is on the Pescatarian eating plan and is following her eating plan approximately 90 % of the time. She states she is walking 30 minutes 5 times per week. [name] states that she has lost 1 pound (weight 261 lbs 06/05/19). She is doing well with protein and water. We were unable to weigh the patient today for this TeleHealth visit. She feels as if she has lost weight since her last visit. She has lost 2 lbs since starting treatment with [name].  Diabetes II Cindy Carey has a diagnosis of diabetes type II. She is taking Jardiance and Metformin. [name] states fasting BGs range between 90 and 110's and she denies any hypoglycemic episodes. Last A1c was at 7.0 and last insulin level was at 40.3 She has been working on intensive lifestyle modifications including diet, exercise, and weight loss to help control her blood glucose levels.  Hypertension Cindy Carey is a 63 y.o. female with hypertension. She is taking Lisinopril. Cindy Carey denies chest pain. She is working weight loss to help control her blood pressure with the goal of decreasing her risk of heart attack and stroke. Cindy Carey blood pressure is well controlled.  ASSESSMENT AND PLAN:  Diabetes mellitus without complication ([place])  Essential hypertension  Class 3 severe obesity with serious comorbidity and body mass index (BMI) of 40.0 to 44.9 in adult, unspecified obesity type ([place])  PLAN:  Diabetes II [name] has been given extensive diabetes education by myself  today including ideal fasting and post-prandial blood glucose readings, individual ideal Hgb A1c goals and hypoglycemia prevention. We discussed the importance of good blood sugar control to decrease the likelihood of diabetic complications such as nephropathy, neuropathy, limb loss, blindness, coronary artery disease, and death. We discussed the importance of intensive lifestyle modification including diet, exercise and weight loss as the first line treatment for diabetes. [name] agrees to continue her diabetes medications and will follow up at the agreed upon time.  Hypertension We discussed sodium restriction, working on healthy weight loss, and a regular exercise program as the means to achieve improved blood pressure control. [name] agreed with this plan and agreed to follow up as directed. We will continue to monitor her blood pressure as well as her progress with the above lifestyle modifications. She will continue her medications as prescribed and will watch for signs of hypotension as she continues her lifestyle modifications.  Obesity Cindy Carey is currently in the action stage of change. As such, her goal is to continue with weight loss efforts She has agreed to follow the Pescatarian eating plan Cindy Carey is walking on a regular basis. She will consider using resistance bands for weight loss and overall health benefits. We discussed the following Behavioral Modification Strategies today: planning for success, increase H2O intake, no skipping meals, keeping healthy foods in the home, increasing lean protein intake, decreasing simple carbohydrates, increasing vegetables, decrease eating out and work on meal planning and intentional eating  Cindy Carey has agreed to follow up with our clinic in 4 weeks. She was informed  of the importance of frequent follow up visits to maximize her success with intensive lifestyle modifications for her multiple health conditions.  ALLERGIES: Allergies  Allergen Reactions  . Bee  Venom Anaphylaxis  . Shellfish Allergy Anaphylaxis  . Invokana [Canagliflozin] Itching  . Penicillins Hives    MEDICATIONS: Current Outpatient Medications on File Prior to Visit  Medication Sig Dispense Refill  . Accu-Chek FastClix Lancets MISC USE TO CHECK BLOOD SUGAR TWICE A DAY AS DIRECTED 102 each 3  . ACCU-CHEK GUIDE test strip USE TO CHECK BLOOD SUGAR TWICE A DAY AS DIRECTED 100 each 1  . ALPRAZolam (XANAX) 0.25 MG tablet Take 1-2 tablets (0.25-0.5 mg total) by mouth at bedtime as needed for anxiety or sleep. 15 tablet 0  . aspirin EC 81 MG tablet Take 81 mg by mouth daily.    . Calcium Carbonate-Vit D-Min (CALCIUM 600+D PLUS MINERALS) 600-400 MG-UNIT TABS Take 1 tablet by mouth 2 (two) times daily.    . cyclobenzaprine (FLEXERIL) 10 MG tablet Take 1 tablet (10 mg total) by mouth 3 (three) times daily as needed for muscle spasms. 30 tablet 0  . empagliflozin (JARDIANCE) 10 MG TABS tablet Take 10 mg by mouth daily with breakfast. 90 tablet 0  . EPINEPHrine 0.3 mg/0.3 mL IJ SOAJ injection Inject 0.3 mLs (0.3 mg total) into the muscle once as needed. Reported on 04/30/2016 1 Device 0  . lisinopril (ZESTRIL) 10 MG tablet TAKE 1 TABLET BY MOUTH DAILY. 90 tablet 0  . Magnesium 250 MG TABS Take 250 mg by mouth daily.    . metFORMIN (GLUCOPHAGE-XR) 500 MG 24 hr tablet Take 2 tablets (1,000 mg total) by mouth 2 (two) times daily. 360 tablet 1  . METRONIDAZOLE, TOPICAL, 0.75 % LOTN Apply 1 application topically 2 (two) times daily. 59 mL 12  . Multiple Vitamins-Minerals (MULTIVITAMIN PO) Take 1 tablet by mouth daily. Reported on 03/31/2016    . promethazine (PHENERGAN) 25 MG tablet Take 1 tablet (25 mg total) by mouth every 8 (eight) hours as needed for nausea. 30 tablet 0  . simvastatin (ZOCOR) 40 MG tablet Take 1 tablet (40 mg total) by mouth every evening. 90 tablet 1  . tamoxifen (NOLVADEX) 20 MG tablet Take 1 tablet (20 mg total) by mouth daily. 90 tablet 3  . TRULICITY 1.5 EV/0.3JK SOPN  INJECT 1.5 MG INTO THE SKIN EVERY 7 DAYS. 6 mL 2   No current facility-administered medications on file prior to visit.     PAST MEDICAL HISTORY: Past Medical History:  Diagnosis Date  . Anemia   . Asthma   . Blood transfusion without reported diagnosis 1983  . Diabetes mellitus without complication ([place]) 0938   T2DM  . Dyspareunia   . Exercise-induced asthma    worse in winter  . H/O bone density study 2013  . H/O cold sores   . H/O colonoscopy 2009  . History of endometriosis   . Hyperlipidemia   . Hypertension   . Injury of tendon of rotator cuff 2015   gym injury (right)  . Knee pain 08/2015   L>R  . Migraine with typical aura   . Mild CAD 04/04/2018   Mild disease on coronary CT-A 01/2018.  . Morbid obesity ([place]) 09/04/12   BMI 49.4 kg/m^2   . Osteoarthritis   . Plantar fasciitis, left 2019  . Trigger finger of left thumb 08/2018   injected by Dr. Fredna Dow 09/18/18    PAST SURGICAL HISTORY: Past Surgical History:  Procedure Laterality  Date  . BREAST BIOPSY  2010  . BREAST LUMPECTOMY  2010  . BREAST LUMPECTOMY WITH RADIOACTIVE SEED LOCALIZATION Left 07/23/2015   Procedure: LEFT BREAST SEED GUIDED EXCISION;  Surgeon: Rolm Bookbinder, MD;  Location: [facility];  Service: General;  Laterality: Left;  . BREATH TEK H PYLORI  08/28/2012   Procedure: BREATH TEK H PYLORI;  Surgeon: Pedro Earls, MD;  Location: [facility];  Service: General;  Laterality: N/A;  . CHOLECYSTECTOMY  2007  . EXPLORATORY LAPAROTOMY  1983   endometriosis  . GANGLION CYST EXCISION Left 1999   Foot  . TOTAL ABDOMINAL HYSTERECTOMY W/ BILATERAL SALPINGOOPHORECTOMY  1984   endometriosis (and appendectomy)    SOCIAL HISTORY: Social History   Tobacco Use  . Smoking status: Never Smoker  . Smokeless tobacco: Never Used  Substance Use Topics  . Alcohol use: No  . Drug use: No    FAMILY HISTORY: Family History  Problem Relation Age of Onset  . Diabetes Father   . Heart  attack Father 68  . Heart disease Father   . Hypertension Father   . Peripheral Artery Disease Father        stents at 6  . Diabetes Paternal Grandfather   . Heart disease Paternal Grandfather   . Osteoporosis Maternal Grandmother   . Heart failure Maternal Grandmother   . Thyroid disease Mother        hypothyroidism  . Heart disease Mother 3       CABG at 70  . Hypertension Mother   . Diverticulitis Mother 29       perforation, required surgery  . Pneumonia Sister   . Cancer Maternal Grandfather        lung  . Dementia Paternal Grandmother   . Cancer Paternal Grandmother        melanoma on her foot (not related to death)  . Breast cancer Paternal Aunt        55's  . Cancer Maternal Aunt        lung  . Cancer Maternal Uncle        lung  . Breast cancer Cousin        mets to lung  . Congestive Heart Failure Maternal Uncle   . Breast cancer Cousin     ROS: Review of Systems  Constitutional: Positive for weight loss.  Cardiovascular: Negative for chest pain.  Endo/Heme/Allergies:       Negative for hypoglycemia    PHYSICAL EXAM: Pt in no acute distress  RECENT LABS AND TESTS: BMET    Component Value Date/Time   NA 138 03/23/2019 0831   NA 139 11/21/2015 0826   K 5.3 (H) 03/23/2019 0831   K 4.7 11/21/2015 0826   CL 103 03/23/2019 0831   CO2 22 03/23/2019 0831   CO2 24 11/21/2015 0826   GLUCOSE 115 (H) 03/23/2019 0831   GLUCOSE 96 09/30/2016 0811   GLUCOSE 125 11/21/2015 0826   BUN 14 03/23/2019 0831   BUN 12.5 11/21/2015 0826   CREATININE 0.90 03/23/2019 0831   CREATININE 0.93 09/30/2016 0811   CREATININE 1.0 11/21/2015 0826   CALCIUM 9.4 03/23/2019 0831   CALCIUM 9.6 11/21/2015 0826   GFRNONAA 69 03/23/2019 0831   GFRAA 80 03/23/2019 0831   Lab Results  Component Value Date   HGBA1C 7.0 (H) 03/23/2019   HGBA1C 6.8 (A) 12/14/2018   HGBA1C 7.0 (H) 07/13/2018   HGBA1C 6.0 (A) 04/13/2018   HGBA1C 6.2 12/07/2017  Lab Results  Component Value  Date   INSULIN 40.3 (H) 03/23/2019   INSULIN 59.1 (H) 07/13/2018   CBC    Component Value Date/Time   WBC 5.6 03/23/2019 0831   WBC 7.8 09/30/2016 0811   RBC 4.35 03/23/2019 0831   RBC 4.92 09/30/2016 0811   HGB 13.1 03/23/2019 0831   HGB 15.1 11/21/2015 0826   HCT 40.5 03/23/2019 0831   HCT 46.3 11/21/2015 0826   PLT 94 (LL) 03/23/2019 0831   MCV 93 03/23/2019 0831   MCV 95.1 11/21/2015 0826   MCH 30.1 03/23/2019 0831   MCH 32.3 09/30/2016 0811   MCHC 32.3 03/23/2019 0831   MCHC 33.1 09/30/2016 0811   RDW 13.5 03/23/2019 0831   RDW 15.1 (H) 11/21/2015 0826   LYMPHSABS 2.3 03/23/2019 0831   LYMPHSABS 2.6 11/21/2015 0826   MONOABS 546 09/30/2016 0811   MONOABS 0.7 11/21/2015 0826   EOSABS 0.1 03/23/2019 0831   BASOSABS 0.0 03/23/2019 0831   BASOSABS 0.0 11/21/2015 0826   Iron/TIBC/Ferritin/ %Sat    Component Value Date/Time   IRON 84 12/07/2017 1619   Lipid Panel     Component Value Date/Time   CHOL 120 12/14/2018 0950   TRIG 100 12/14/2018 0950   HDL 58 12/14/2018 0950   CHOLHDL 2.1 12/14/2018 0950   CHOLHDL 2.3 09/30/2016 0811   VLDL 26 09/30/2016 0811   LDLCALC 42 12/14/2018 0950   Hepatic Function Panel     Component Value Date/Time   PROT 6.5 03/23/2019 0831   PROT 7.2 11/21/2015 0826   ALBUMIN 3.5 (L) 03/23/2019 0831   ALBUMIN 3.8 11/21/2015 0826   AST 50 (H) 03/23/2019 0831   AST 24 11/21/2015 0826   ALT 27 03/23/2019 0831   ALT 24 11/21/2015 0826   ALKPHOS 103 03/23/2019 0831   ALKPHOS 75 11/21/2015 0826   BILITOT 0.4 03/23/2019 0831   BILITOT 0.58 11/21/2015 0826      Component Value Date/Time   TSH 2.240 12/14/2018 0950   TSH 2.200 12/07/2017 1619   TSH 1.79 03/31/2016 0859     Ref. Range 03/23/2019 08:31  Vitamin D, 25-Hydroxy Latest Ref Range: 30.0 - 100.0 ng/mL 41.4    I, Doreene Nest, am acting as Location manager for [employer]. Owens Shark, DO  I have reviewed the above documentation for accuracy and completeness, and I agree with the  above. -Jearld Lesch, DO

## 2019-06-13 MED FILL — TRULICITY 1.5 MG/0.5 ML PEN: 1.5 | 84 days supply | Qty: 6 | Fill #0

## 2019-06-18 ENCOUNTER — Encounter: Payer: Self-pay | Admitting: Family Medicine

## 2019-06-19 ENCOUNTER — Ambulatory Visit (INDEPENDENT_AMBULATORY_CARE_PROVIDER_SITE_OTHER): Payer: No Typology Code available for payment source | Admitting: Bariatrics

## 2019-06-22 NOTE — Telephone Encounter (Signed)
covid screening sheet has been done for patient.

## 2019-06-26 ENCOUNTER — Ambulatory Visit: Payer: No Typology Code available for payment source | Admitting: Cardiovascular Disease

## 2019-06-26 NOTE — Progress Notes (Signed)
Start time: 11:12 End time: 11:41   Virtual Visit via Video Note  I connected with Cindy Carey on 06/27/2019 by a video enabled telemedicine application and verified that I am speaking with the correct person using two identifiers.  Location: Patient: work, alone in a room Provider: office   I discussed the limitations of evaluation and management by telemedicine and the availability of in person appointments. The patient expressed understanding and agreed to proceed. She consents to insurance being filed for this visit.   History of Present Illness:  Chief Complaint  Patient presents with  . Med check    she wanted to talk to you about going forward with the healthy weightloss center-does not desire to continue.    Patient presents for 6 month follow-up. She has been going to [facility] clinic as part of supervised weight loss program, that was needed for approval for bariatric surgery.  Unfortunately, she found out that the fine print said that she needed to show progress with her sessions, which she has not done.  So insurance won't approve it.  She feels like she is told the same thing each visit, doesn't find it helpful, feels like she is not fitting into the mold they want her to. She has been under [facility] care of [facility] since September 2019. Starting weight was 262# She has not had significant weight loss with their program.  Recent labs were reviewed, including A1c, c-met, CBC, normal Vitamin D level, and insulin level (elevated at 40.3, down from 59 on initial check). Wt Readings from Last 3 Encounters:  06/27/19 258 lb (117 kg)  05/21/19 260 lb (117.9 kg)  05/02/19 262 lb (118.8 kg)   Diabetes: Currently on Trulicity (3.3LK/TG), Metformin (2054m/d), and Jardiance 163mtolerating without side effects. Sugars have been running89-103 in the mornings; 2 hr PP 130's-140's.  Denies hypoglycemia Last eye exam: 10/2018 Checks feet regularly without  concerns. Lab Results  Component Value Date   HGBA1C 7.0 (H) 03/23/2019   ASA 8128mdded 03/2018. Calcium score of 1  Left PF--s/p injection by Dr. RegPaulla Dolly 09/2018, and again in 12/2018. Wears orthotics. She reports that her heel is completely healed. She just recently went camping, did a lot of walking/hiking and was fine.  Trigger finger L thumb 09/2018, treated with injection by Dr. KuzFredna Dowhe ended up having a second injection as well. Occasionally locks up, but not for long.  Hypertension follow-up: Blood pressures are normal (checks at work, and at weight loss clinic), 110/70.Denies dizziness, headaches, chest pain.  Denies side effects of medications.  She no longer has leg cramps at night, since taking flexeril at night (only if she doesn't take it). BP Readings from Last 3 Encounters:  05/21/19 121/75  05/02/19 118/77  04/18/19 104/70   Migraines--Had more over the past couple of months than she has in years.  She isn't sure why (?stress from COVID, taking a lot of call, interrupted sleep).  She has been having about one a week.  Every other week she needs a phenergan and to go to bed. Has been off topamax (caused hair thinning) for well over a year.  She has aura,green fuzzy vision change, takes ibuprofen with the aura; uses phenergan if this doesn't work.  Hyperlipidemia follow-up: Patient is reportedly following a low-fat, low cholesterol diet. Compliant with medications and denies medication side effects. Lab Results  Component Value Date   CHOL 120 12/14/2018   HDL 58 12/14/2018  LDLCALC 42 12/14/2018   TRIG 100 12/14/2018   CHOLHDL 2.1 12/14/2018   She reports that she has been using 1/4 to 1/2 tablet of flexeril every night, and is sleeping much better. Realized that she was waking up with pain.  "I now sleep like a normal person".  No longer has as much pain (shoulders, hip) when she wakes up, and is not waking her at night.  She is requesting refill of this  medication.   PMH, PSH, SH reviewed  Outpatient Encounter Medications as of 06/27/2019  Medication Sig Note  . Accu-Chek FastClix Lancets MISC USE TO CHECK BLOOD SUGAR TWICE A DAY AS DIRECTED   . ACCU-CHEK GUIDE test strip USE TO CHECK BLOOD SUGAR TWICE A DAY AS DIRECTED   . aspirin EC 81 MG tablet Take 81 mg by mouth daily.   . Calcium Carbonate-Vit D-Min (CALCIUM 600+D PLUS MINERALS) 600-400 MG-UNIT TABS Take 1 tablet by mouth 2 (two) times daily.   . Cholecalciferol (VITAMIN D3) 125 MCG (5000 UT) CAPS Take 1 capsule by mouth daily.   . cyclobenzaprine (FLEXERIL) 10 MG tablet Take 1 tablet (10 mg total) by mouth 3 (three) times daily as needed for muscle spasms.   . empagliflozin (JARDIANCE) 10 MG TABS tablet Take 10 mg by mouth daily with breakfast.   . lisinopril (ZESTRIL) 10 MG tablet TAKE 1 TABLET BY MOUTH DAILY.   . Magnesium 250 MG TABS Take 250 mg by mouth daily. 03/31/2016: Takes for migraine prevention  . metFORMIN (GLUCOPHAGE-XR) 500 MG 24 hr tablet Take 2 tablets (1,000 mg total) by mouth 2 (two) times daily.   [name] METRONIDAZOLE, TOPICAL, 0.75 % LOTN Apply 1 application topically 2 (two) times daily.   . Multiple Vitamins-Minerals (MULTIVITAMIN PO) Take 1 tablet by mouth daily. Reported on 03/31/2016   . simvastatin (ZOCOR) 40 MG tablet Take 1 tablet (40 mg total) by mouth every evening.   . tamoxifen (NOLVADEX) 20 MG tablet Take 1 tablet (20 mg total) by mouth daily.   . TRULICITY 1.5 SX/1.1BZ SOPN INJECT 1.5 MG INTO THE SKIN EVERY 7 DAYS.   . [DISCONTINUED] cyclobenzaprine (FLEXERIL) 10 MG tablet Take 1 tablet (10 mg total) by mouth 3 (three) times daily as needed for muscle spasms. 06/27/2019: Uses 1/4-1/2 at bedtime  . EPINEPHrine 0.3 mg/0.3 mL IJ SOAJ injection Inject 0.3 mLs (0.3 mg total) into the muscle once as needed. Reported on 04/30/2016 (Patient not taking: Reported on 06/27/2019)   . promethazine (PHENERGAN) 25 MG tablet Take 1 tablet (25 mg total) by mouth every 8 (eight)  hours as needed for nausea. (Patient not taking: Reported on 06/27/2019)   . [DISCONTINUED] ALPRAZolam (XANAX) 0.25 MG tablet Take 1-2 tablets (0.25-0.5 mg total) by mouth at bedtime as needed for anxiety or sleep. (Patient not taking: Reported on 06/27/2019)    No facility-administered encounter medications on file as of 06/27/2019.    Allergies  Allergen Reactions  . Bee Venom Anaphylaxis  . Shellfish Allergy Anaphylaxis  . Invokana [Canagliflozin] Itching  . Penicillins Hives   ROS:  Denies fever, chills, URI symptoms, dizziness, chest pain, shortness of breath. GI or GU complaints.  +migraines per HPI.  Heel pain resolved.  She had rash due to mask (under nose)---responded to acne meds, and is now using barrier cream with good results.  PHYSICAL EXAM:  BP 106/70   Pulse 68   Ht 5' 3"  (1.6 m)   Wt 258 lb (117 kg)   LMP 10/18/1982  BMI 45.70 kg/m  Wt Readings from Last 3 Encounters:  06/27/19 258 lb (117 kg)  05/21/19 260 lb (117.9 kg)  05/02/19 262 lb (118.8 kg)   Well appearing, obese female, in good spirits.  Normal eye contact, speech. She is alert and oriented.   Normal grooming Exam is limited due to virtual nature of the visit.   Assessment and Plan:  Essential hypertension - well controlled - Plan: Comprehensive metabolic panel  Class 3 severe obesity due to excess calories with serious comorbidity and body mass index (BMI) of 45.0 to 49.9 in adult [facility]) - doesn't seem to be benefitting from [facility] clinic, will stop. Discussed need to explore other ways to get the weight off, if bariatric surgery not covered  Diabetes mellitus with coincident hypertension ([place]) - due for repeat A1c.  If still close to 7, recommend increasing Jardiance dose to 94m - Plan: Hemoglobin A1c, Comprehensive metabolic panel  Hyperlipidemia, unspecified hyperlipidemia type - controlled on last check  Migraine with typical aura - increasing frequency. Try to control triggers. Consider resuming  topamax vs other preventative (perhaps nortriptyline, and stop flexeril)  Vitamin D deficiency - adequately replaced. Cont 5000 IU daily  Thrombocytopenia ([facility]) - mild, stable.  Pt would like recheck with labs - Plan: CBC with Differential/Platelet   A1c, CBC, c-met--to schedule lab visit.   Follow Up Instructions:    I discussed the assessment and treatment plan with the patient. The patient was provided an opportunity to ask questions and all were answered. The patient agreed with the plan and demonstrated an understanding of the instructions.   The patient was advised to call back or seek an in-person evaluation if the symptoms worsen or if the condition fails to improve as anticipated.  I provided 29 minutes of non-face-to-face time during this encounter.   EVikki Ports MD

## 2019-06-27 ENCOUNTER — Ambulatory Visit (INDEPENDENT_AMBULATORY_CARE_PROVIDER_SITE_OTHER): Payer: No Typology Code available for payment source | Admitting: Family Medicine

## 2019-06-27 ENCOUNTER — Encounter: Payer: Self-pay | Admitting: Family Medicine

## 2019-06-27 ENCOUNTER — Other Ambulatory Visit: Payer: Self-pay

## 2019-06-27 VITALS — BP 106/70 | HR 68 | Ht 63.0 in | Wt 258.0 lb

## 2019-06-27 DIAGNOSIS — E66813 Obesity, class 3: Secondary | ICD-10-CM

## 2019-06-27 DIAGNOSIS — I10 Essential (primary) hypertension: Secondary | ICD-10-CM

## 2019-06-27 DIAGNOSIS — D696 Thrombocytopenia, unspecified: Secondary | ICD-10-CM

## 2019-06-27 DIAGNOSIS — Z6841 Body Mass Index (BMI) 40.0 and over, adult: Secondary | ICD-10-CM

## 2019-06-27 DIAGNOSIS — E119 Type 2 diabetes mellitus without complications: Secondary | ICD-10-CM | POA: Diagnosis not present

## 2019-06-27 DIAGNOSIS — E559 Vitamin D deficiency, unspecified: Secondary | ICD-10-CM

## 2019-06-27 DIAGNOSIS — G43109 Migraine with aura, not intractable, without status migrainosus: Secondary | ICD-10-CM

## 2019-06-27 DIAGNOSIS — E785 Hyperlipidemia, unspecified: Secondary | ICD-10-CM | POA: Diagnosis not present

## 2019-06-27 MED ORDER — CYCLOBENZAPRINE HCL 10 MG PO TABS: 10.0000 mg | ORAL_TABLET | Freq: Three times a day (TID) | ORAL | 0 refills | Status: DC | PRN

## 2019-06-27 MED FILL — CYCLOBENZAPRINE HCL 10 MG T: 10 | 10 days supply | Qty: 30 | Fill #0

## 2019-06-27 NOTE — Progress Notes (Signed)
Done

## 2019-06-27 NOTE — Patient Instructions (Addendum)
Continue to track your headaches, and let me know if/when you're ready to be started back on a preventative med (either restarting topamax, vs another medication).  I entered orders for your lab visit--checking A1c, CBC and c-met. If the A1c remains close to 7, I think it may be worth trying the 3m dose of Jardiance, rather than refilling the 115m  Your insulin levels were still a little elevated (though improved) on your last check in June.

## 2019-06-28 MED FILL — ACCU-CHEK FASTCLIX LANCETS: 51 days supply | Qty: 102 | Fill #2

## 2019-06-28 MED FILL — SIMVASTATIN 40 MG TABLET: 40 | 90 days supply | Qty: 90 | Fill #1

## 2019-07-02 ENCOUNTER — Ambulatory Visit (INDEPENDENT_AMBULATORY_CARE_PROVIDER_SITE_OTHER): Payer: No Typology Code available for payment source | Admitting: Bariatrics

## 2019-07-03 ENCOUNTER — Other Ambulatory Visit: Payer: No Typology Code available for payment source

## 2019-07-03 ENCOUNTER — Other Ambulatory Visit: Payer: Self-pay

## 2019-07-03 DIAGNOSIS — E119 Type 2 diabetes mellitus without complications: Secondary | ICD-10-CM

## 2019-07-03 DIAGNOSIS — I10 Essential (primary) hypertension: Secondary | ICD-10-CM

## 2019-07-03 DIAGNOSIS — D696 Thrombocytopenia, unspecified: Secondary | ICD-10-CM

## 2019-07-04 ENCOUNTER — Encounter: Payer: Self-pay | Admitting: Family Medicine

## 2019-07-04 LAB — CBC WITH DIFFERENTIAL/PLATELET
Basophils Absolute: 0 10*3/uL (ref 0.0–0.2)
Basos: 0 %
EOS (ABSOLUTE): 0.2 10*3/uL (ref 0.0–0.4)
Eos: 2 %
Hematocrit: 37.6 % (ref 34.0–46.6)
Hemoglobin: 11.6 g/dL (ref 11.1–15.9)
Immature Grans (Abs): 0 10*3/uL (ref 0.0–0.1)
Immature Granulocytes: 0 %
Lymphocytes Absolute: 2.4 10*3/uL (ref 0.7–3.1)
Lymphs: 38 %
MCH: 27.2 pg (ref 26.6–33.0)
MCHC: 30.9 g/dL — ABNORMAL LOW (ref 31.5–35.7)
MCV: 88 fL (ref 79–97)
Monocytes Absolute: 0.7 10*3/uL (ref 0.1–0.9)
Monocytes: 12 %
Neutrophils Absolute: 3 10*3/uL (ref 1.4–7.0)
Neutrophils: 48 %
Platelets: 92 10*3/uL — CL (ref 150–450)
RBC: 4.27 x10E6/uL (ref 3.77–5.28)
RDW: 15.1 % (ref 11.7–15.4)
WBC: 6.3 10*3/uL (ref 3.4–10.8)

## 2019-07-04 LAB — COMPREHENSIVE METABOLIC PANEL
ALT: 27 IU/L (ref 0–32)
AST: 50 IU/L — ABNORMAL HIGH (ref 0–40)
Albumin/Globulin Ratio: 1.5 (ref 1.2–2.2)
Albumin: 3.7 g/dL — ABNORMAL LOW (ref 3.8–4.8)
Alkaline Phosphatase: 104 IU/L (ref 39–117)
BUN/Creatinine Ratio: 15 (ref 12–28)
BUN: 12 mg/dL (ref 8–27)
Bilirubin Total: 0.4 mg/dL (ref 0.0–1.2)
CO2: 24 mmol/L (ref 20–29)
Calcium: 9.2 mg/dL (ref 8.7–10.3)
Chloride: 103 mmol/L (ref 96–106)
Creatinine, Ser: 0.81 mg/dL (ref 0.57–1.00)
GFR calc Af Amer: 91 mL/min/{1.73_m2} (ref >59)
GFR calc non Af Amer: 79 mL/min/{1.73_m2} (ref >59)
Globulin, Total: 2.5 g/dL (ref 1.5–4.5)
Glucose: 128 mg/dL — ABNORMAL HIGH (ref 65–99)
Potassium: 4.6 mmol/L (ref 3.5–5.2)
Sodium: 141 mmol/L (ref 134–144)
Total Protein: 6.2 g/dL (ref 6.0–8.5)

## 2019-07-04 LAB — HEMOGLOBIN A1C
Est. average glucose Bld gHb Est-mCnc: 143 mg/dL
Hgb A1c MFr Bld: 6.6 % — ABNORMAL HIGH (ref 4.8–5.6)

## 2019-07-07 ENCOUNTER — Encounter: Payer: Self-pay | Admitting: Family Medicine

## 2019-07-07 DIAGNOSIS — E119 Type 2 diabetes mellitus without complications: Secondary | ICD-10-CM

## 2019-07-07 MED ORDER — JARDIANCE 10 MG PO TABS: 10.0000 mg | ORAL_TABLET | Freq: Every day | ORAL | 1 refills | Status: DC

## 2019-07-09 MED FILL — JARDIANCE 10 MG TABLET: 10 | 90 days supply | Qty: 90 | Fill #0

## 2019-07-18 ENCOUNTER — Other Ambulatory Visit: Payer: Self-pay | Admitting: Family Medicine

## 2019-07-18 MED ORDER — METFORMIN HCL ER 500 MG PO TB24: 1000.0000 mg | ORAL_TABLET | Freq: Two times a day (BID) | ORAL | 1 refills | Status: DC

## 2019-07-18 MED FILL — METFORMIN HCL ER 500 MG TB2: 500 | 90 days supply | Qty: 360 | Fill #0

## 2019-07-24 ENCOUNTER — Other Ambulatory Visit: Payer: Self-pay

## 2019-07-24 ENCOUNTER — Ambulatory Visit
Admission: RE | Admit: 2019-07-24 | Discharge: 2019-07-24 | Disposition: A | Discharge status: Home / Self Care | Payer: No Typology Code available for payment source | Source: Ambulatory Visit | Attending: Family Medicine | Admitting: Family Medicine

## 2019-07-24 DIAGNOSIS — Z1231 Encounter for screening mammogram for malignant neoplasm of breast: Secondary | ICD-10-CM

## 2019-08-10 ENCOUNTER — Encounter: Payer: Self-pay | Admitting: Family Medicine

## 2019-08-10 NOTE — Telephone Encounter (Signed)
Okay for referral?

## 2019-08-13 ENCOUNTER — Telehealth: Payer: Self-pay | Admitting: *Deleted

## 2019-08-13 MED FILL — TAMOXIFEN 20 MG TABLET: 20 | 90 days supply | Qty: 90 | Fill #1

## 2019-08-13 NOTE — Telephone Encounter (Signed)
Patient is going to see Dr.Tiffany Oval Linsey 08/14/2019 @ 3:20pm. Dr. Tomi Bamberger has approved this and patient will contact [facility] to inform them.

## 2019-08-14 ENCOUNTER — Encounter: Payer: Self-pay | Admitting: Cardiovascular Disease

## 2019-08-14 ENCOUNTER — Ambulatory Visit: Payer: No Typology Code available for payment source | Admitting: Cardiovascular Disease

## 2019-08-14 ENCOUNTER — Other Ambulatory Visit: Payer: Self-pay

## 2019-08-14 DIAGNOSIS — I10 Essential (primary) hypertension: Secondary | ICD-10-CM

## 2019-08-14 DIAGNOSIS — I2510 Atherosclerotic heart disease of native coronary artery without angina pectoris: Secondary | ICD-10-CM

## 2019-08-14 NOTE — Progress Notes (Signed)
Cardiology Office Note   Date:  08/14/2019   ID:  Kalista, Cugini 23-May-1956, MRN [id number]  PCP:  Rita Ohara, MD  Cardiologist:   Skeet Latch, MD   No chief complaint on file.     History of Present Illness: KAEDENCE PEAIRS is a 63 y.o. female with non-obstructive CAD, hypertension, hyperlipidemia, diabetes and morbid obesity here for follow up.  She was initially seen 07/2015 for evaluation of an abnormal EKG.  Ms. Imgrund was seen by anesthesia for pre-operative assessment was noted to have an incomplete bundle branch block.  Given that she has a strong family history of heart disease, she requested follow up with cardiology.  At that time she was feeling well and had no additional testing.  She followed up with Jory Sims, DNP, on 12/2017 for pre-operative risk assessment prior to bariatric surgery.  Her mobility was limited due to knee pain.  She was referred for cardiac CT-A 01/18/18 that revealed a small amount of calcified plaque in the LAD but no obstructive CAD.  Her coronary calcium score was 1, which was 64th percentile.   At her last appointment Ms. Hinsley was in the process of considering bariatric surgery.  At her last appointment her blood pressure was elevated.  However this was thought to be due to being anxious about her CT scan results.  She was asked to track it at home.  At home her blood pressure has been well have been controlled.  Since the time of her last appointment she has been feeling well.  Her insurance did not approve the request for bariatric surgery despite the fact that she had participated in all the weight loss programs that were requested.  Since then the coronavirus started, she lost motivation to pursue it any further.  She walks her dog for exercise and has no exertional chest pain or shortness of breath.  She has not had any lower extremity edema, orthopnea, or PND.  She has been busy with work and is doing 50% virtual  visits.   Past Medical History:  Diagnosis Date   Anemia    Asthma    Blood transfusion without reported diagnosis 1983   Diabetes mellitus without complication ([facility]) [id number]   T2DM   Dyspareunia    Exercise-induced asthma    worse in winter   H/O bone density study 2013   H/O cold sores    H/O colonoscopy 2009   History of endometriosis    Hyperlipidemia    Hypertension    Injury of tendon of rotator cuff 2015   gym injury (right)   Knee pain 08/2015   L>R   Migraine with typical aura    Mild CAD 04/04/2018   Mild disease on coronary CT-A 01/2018.   Morbid obesity ([facility]) 09/04/12   BMI 49.4 kg/m^2    Osteoarthritis    Plantar fasciitis, left 2019   Trigger finger of left thumb 08/2018   injected by Dr. Fredna Dow 09/18/18    Past Surgical History:  Procedure Laterality Date   BREAST BIOPSY  2010   BREAST LUMPECTOMY  2010   BREAST LUMPECTOMY WITH RADIOACTIVE SEED LOCALIZATION Left 07/23/2015   Procedure: LEFT BREAST SEED GUIDED EXCISION;  Surgeon: Rolm Bookbinder, MD;  Location: [facility];  Service: General;  Laterality: Left;   BREATH TEK H PYLORI  08/28/2012   Procedure: BREATH TEK H PYLORI;  Surgeon: Pedro Earls, MD;  Location: [facility];  Service: General;  Laterality:  N/A;   CHOLECYSTECTOMY  2007   EXPLORATORY LAPAROTOMY  1983   endometriosis   GANGLION CYST EXCISION Left 1999   Foot   TOTAL ABDOMINAL HYSTERECTOMY W/ BILATERAL SALPINGOOPHORECTOMY  1984   endometriosis (and appendectomy)     Current Outpatient Medications  Medication Sig Dispense Refill   Accu-Chek FastClix Lancets MISC USE TO CHECK BLOOD SUGAR TWICE A DAY AS DIRECTED 102 each 3   ACCU-CHEK GUIDE test strip USE TO CHECK BLOOD SUGAR TWICE A DAY AS DIRECTED 100 each 1   aspirin EC 81 MG tablet Take 81 mg by mouth daily.     Calcium Carbonate-Vit D-Min (CALCIUM 600+D PLUS MINERALS) 600-400 MG-UNIT TABS Take 1 tablet by mouth 2 (two) times daily.       Cholecalciferol (VITAMIN D3) 125 MCG (5000 UT) CAPS Take 1 capsule by mouth daily.     cyclobenzaprine (FLEXERIL) 10 MG tablet Take 1 tablet (10 mg total) by mouth 3 (three) times daily as needed for muscle spasms. 30 tablet 0   empagliflozin (JARDIANCE) 10 MG TABS tablet Take 10 mg by mouth daily with breakfast. 90 tablet 1   EPINEPHrine 0.3 mg/0.3 mL IJ SOAJ injection Inject 0.3 mLs (0.3 mg total) into the muscle once as needed. Reported on 04/30/2016 1 Device 0   lisinopril (ZESTRIL) 10 MG tablet TAKE 1 TABLET BY MOUTH DAILY. 90 tablet 0   Magnesium 250 MG TABS Take 250 mg by mouth daily.     metFORMIN (GLUCOPHAGE-XR) 500 MG 24 hr tablet Take 2 tablets (1,000 mg total) by mouth 2 (two) times daily. 360 tablet 1   METRONIDAZOLE, TOPICAL, 0.75 % LOTN Apply 1 application topically 2 (two) times daily. 59 mL 12   Multiple Vitamins-Minerals (MULTIVITAMIN PO) Take 1 tablet by mouth daily. Reported on 03/31/2016     promethazine (PHENERGAN) 25 MG tablet Take 1 tablet (25 mg total) by mouth every 8 (eight) hours as needed for nausea. 30 tablet 0   simvastatin (ZOCOR) 40 MG tablet Take 1 tablet (40 mg total) by mouth every evening. 90 tablet 1   tamoxifen (NOLVADEX) 20 MG tablet Take 1 tablet (20 mg total) by mouth daily. 90 tablet 3   TRULICITY 1.5 0000000 SOPN INJECT 1.5 MG INTO THE SKIN EVERY 7 DAYS. 6 mL 2   No current facility-administered medications for this visit.     Allergies:   Bee venom, Shellfish allergy, Invokana [canagliflozin], and Penicillins    Social History:  The patient  reports that she has never smoked. She has never used smokeless tobacco. She reports that she does not drink alcohol or use drugs.   Family History:  The patient's family history includes Breast cancer in her cousin, cousin, and paternal aunt; Cancer in her maternal aunt, maternal grandfather, maternal uncle, and paternal grandmother; Congestive Heart Failure in her maternal uncle; Dementia in her  paternal grandmother; Diabetes in her father and paternal grandfather; Diverticulitis (age of onset: 54) in her mother; Heart attack (age of onset: 81) in her father; Heart disease in her father and paternal grandfather; Heart disease (age of onset: 20) in her mother; Heart failure in her maternal grandmother; Hypertension in her father and mother; Osteoporosis in her maternal grandmother; Peripheral Artery Disease in her father; Pneumonia in her sister; Thyroid disease in her mother.    ROS:  Please see the history of present illness.   Otherwise, review of systems are positive for none.   All other systems are reviewed and negative.  PHYSICAL EXAM: VS:  BP 118/72    Pulse 80    Ht 5\' 3"  (1.6 m)    Wt 265 lb (120.2 kg)    LMP 10/18/1982    BMI 46.94 kg/m  , BMI Body mass index is 46.94 kg/m. GENERAL:  Well appearing HEENT: Pupils equal round and reactive, fundi not visualized, oral mucosa unremarkable NECK:  No jugular venous distention, waveform within normal limits, carotid upstroke brisk and symmetric, no bruits LUNGS:  Clear to auscultation bilaterally HEART:  RRR.  PMI not displaced or sustained,S1 and S2 within normal limits, no S3, no S4, no clicks, no rubs, no murmurs ABD:  Flat, positive bowel sounds normal in frequency in pitch, no bruits, no rebound, no guarding, no midline pulsatile mass, no hepatomegaly, no splenomegaly EXT:  2 plus pulses throughout, no edema, no cyanosis no clubbing SKIN:  No rashes no nodules NEURO:  Cranial nerves II through XII grossly intact, motor grossly intact throughout PSYCH:  Cognitively intact, oriented to person place and time   EKG:  EKG is ordered today. The ekg ordered 07/18/15 shows sinus rhythm at 70 bpm.  Left anterior fascicular block. Incomplete right bundle branch block. Low voltage in the precordial leads. 08/14/2019: Sinus rhythm.  Rate 80 bpm.  LAFB.  Cardiac CT-A 01/18/18: IMPRESSION: 1. Coronary artery calcium score 1 Agatston  unit, Placing the patient in the 64th percentile for age and gender. This suggests intermediate risk for future cardiac events. 2.  No obstructive coronary disease.   Recent Labs: 12/14/2018: Magnesium 1.5; TSH 2.240 07/03/2019: ALT 27; BUN 12; Creatinine, Ser 0.81; Hemoglobin 11.6; Platelets 92; Potassium 4.6; Sodium 141    Lipid Panel    Component Value Date/Time   CHOL 120 12/14/2018 0950   TRIG 100 12/14/2018 0950   HDL 58 12/14/2018 0950   CHOLHDL 2.1 12/14/2018 0950   CHOLHDL 2.3 09/30/2016 0811   VLDL 26 09/30/2016 0811   LDLCALC 42 12/14/2018 0950      Wt Readings from Last 3 Encounters:  08/14/19 265 lb (120.2 kg)  06/27/19 258 lb (117 kg)  05/21/19 260 lb (117.9 kg)      ASSESSMENT AND PLAN:  # Non-obstructive CAD: # Hyperlipidemia: Add aspirin 81mg  daily.  LDL was 42 on 11/2018.  # Pre-surgical risk assessment: Mild, non-obstructive CAD on cardiac CT-A.  She is at low risk for bariatric surgery.  # Hypertension: Blood pressure has been well-controlled.  Continue lisinopril.  # Obesity: Working on weight loss.  She was deemed in eligible for bariatric surgery by her insurance provider.  It is unclear to me why this would be.  I encouraged her to seek an appeal if she is still interested.  In the interim, continue working on diet and exercise.   Current medicines are reviewed at length with the patient today.  The patient does not have concerns regarding medicines.  The following changes have been made: None  Labs/ tests ordered today include:  No orders of the defined types were placed in this encounter.    Disposition:   FU with Rani Idler C. Oval Linsey, MD in 2 years.    Signed, Skeet Latch, MD  08/14/2019 6:17 PM    [place]

## 2019-08-14 NOTE — Patient Instructions (Signed)
Medication Instructions:  Your physician recommends that you continue on your current medications as directed. Please refer to the Current Medication list given to you today.  *If you need a refill on your cardiac medications before your next appointment, please call your pharmacy*  Lab Work: NONE   Testing/Procedures: NONE  Follow-Up: At [facility], you and your health needs are our priority.  As part of our continuing mission to provide you with exceptional heart care, we have created designated Provider Care Teams.  These Care Teams include your primary Cardiologist (physician) and Advanced Practice Providers (APPs -  Physician Assistants and Nurse Practitioners) who all work together to provide you with the care you need, when you need it.  Your next appointment:    24 months You will receive a reminder letter in the mail two months in advance. If you don't receive a letter, please call our office to schedule the follow-up appointment.  The format for your next appointment:   In Person  Provider:   You may see Skeet Latch, MD or one of the following Advanced Practice Providers on your designated Care Team:    Kerin Ransom, PA-C  [place], [place]  Coletta Memos, [place]

## 2019-08-26 ENCOUNTER — Other Ambulatory Visit: Payer: Self-pay | Admitting: Family Medicine

## 2019-08-26 DIAGNOSIS — I10 Essential (primary) hypertension: Secondary | ICD-10-CM

## 2019-08-27 MED ORDER — LISINOPRIL 10 MG PO TABS: 10.0000 mg | ORAL_TABLET | Freq: Every day | ORAL | 0 refills | Status: DC

## 2019-08-27 MED ORDER — PROMETHAZINE HCL 25 MG PO TABS: 25.0000 mg | ORAL_TABLET | Freq: Three times a day (TID) | ORAL | 0 refills | Status: DC | PRN

## 2019-08-27 MED FILL — PROMETHAZINE 25 MG TABLET: 25 | 10 days supply | Qty: 30 | Fill #0

## 2019-08-27 MED FILL — LISINOPRIL 10 MG TABS: 10 | 90 days supply | Qty: 90 | Fill #0

## 2019-08-27 NOTE — Telephone Encounter (Signed)
Is this okay to refill? Her tablets have expired.

## 2019-09-03 MED FILL — TRULICITY 1.5 MG/0.5 ML PEN: 1.5 | 84 days supply | Qty: 6 | Fill #1

## 2019-09-24 ENCOUNTER — Encounter: Payer: Self-pay | Admitting: Family Medicine

## 2019-09-24 ENCOUNTER — Other Ambulatory Visit: Payer: Self-pay | Admitting: Family Medicine

## 2019-09-24 DIAGNOSIS — E785 Hyperlipidemia, unspecified: Secondary | ICD-10-CM

## 2019-09-24 MED FILL — SIMVASTATIN 40 MG TABLET: 40 | 90 days supply | Qty: 90 | Fill #0

## 2019-09-27 ENCOUNTER — Ambulatory Visit: Payer: No Typology Code available for payment source | Admitting: Obstetrics and Gynecology

## 2019-10-03 MED FILL — JARDIANCE 10 MG TABLET: 10 | 90 days supply | Qty: 90 | Fill #1

## 2019-10-09 ENCOUNTER — Other Ambulatory Visit: Payer: Self-pay | Admitting: Family Medicine

## 2019-10-09 MED ORDER — EPINEPHRINE 0.3 MG/0.3ML IJ SOAJ: 0.3000 mg | Freq: Once | INTRAMUSCULAR | 0 refills | Status: DC | PRN

## 2019-10-09 MED FILL — metFORMIN HCL ER 500 MG TB2: 500 | 90 days supply | Qty: 360 | Fill #1

## 2019-10-09 NOTE — Telephone Encounter (Signed)
Is this okay to refill? 

## 2019-10-16 ENCOUNTER — Encounter: Payer: Self-pay | Admitting: Family Medicine

## 2019-10-16 MED ORDER — EPINEPHRINE 0.3 MG/0.3ML IJ SOAJ: 0.3000 mg | Freq: Once | INTRAMUSCULAR | 0 refills | Status: DC | PRN

## 2019-10-17 MED FILL — EPINEPHRINE 0.3 MG AUTO-INJ: 0.3 | 30 days supply | Qty: 2 | Fill #0

## 2019-10-22 ENCOUNTER — Other Ambulatory Visit: Payer: Self-pay | Admitting: *Deleted

## 2019-10-22 ENCOUNTER — Telehealth: Payer: Self-pay | Admitting: *Deleted

## 2019-10-22 ENCOUNTER — Encounter: Payer: Self-pay | Admitting: Family Medicine

## 2019-10-22 MED ORDER — GLUCOSE BLOOD VI STRP: 1.0000 | ORAL_STRIP | Freq: Two times a day (BID) | 2 refills | Status: DC

## 2019-10-22 MED ORDER — FREESTYLE SYSTEM KIT: 1.0000 | PACK | 0 refills | Status: DC | PRN

## 2019-10-22 MED ORDER — FREESTYLE LANCETS MISC: 1.0000 | Freq: Two times a day (BID) | 2 refills | Status: AC

## 2019-10-22 MED FILL — FREESTYLE LITE METER: 30 days supply | Qty: 1 | Fill #0

## 2019-10-22 MED FILL — FREESTYLE LITE TEST STRIP: 50 days supply | Qty: 100 | Fill #0

## 2019-10-22 MED FILL — FREESTYLE LANCETS: 50 days supply | Qty: 100 | Fill #0

## 2019-10-22 NOTE — Telephone Encounter (Signed)
Ok per PCP for patient to see Dr. Melissa Noon for annual eye exam on 10/31/19-patient will contact [facility].

## 2019-10-31 LAB — HM DIABETES EYE EXAM

## 2019-11-01 ENCOUNTER — Encounter: Payer: Self-pay | Admitting: *Deleted

## 2019-11-05 ENCOUNTER — Ambulatory Visit: Payer: No Typology Code available for payment source | Admitting: Obstetrics and Gynecology

## 2019-11-13 MED FILL — TAMOXIFEN 20 MG TABLET: 20 | 90 days supply | Qty: 90 | Fill #2

## 2019-11-20 ENCOUNTER — Other Ambulatory Visit: Payer: Self-pay | Admitting: Family Medicine

## 2019-11-20 DIAGNOSIS — I10 Essential (primary) hypertension: Secondary | ICD-10-CM

## 2019-11-20 MED ORDER — LISINOPRIL 10 MG PO TABS: 10.0000 mg | ORAL_TABLET | Freq: Every day | ORAL | 0 refills | Status: DC

## 2019-11-20 MED FILL — LISINOPRIL 10 MG TABS: 10 | 90 days supply | Qty: 90 | Fill #0

## 2019-12-05 ENCOUNTER — Ambulatory Visit: Payer: No Typology Code available for payment source | Admitting: Obstetrics and Gynecology

## 2019-12-10 ENCOUNTER — Other Ambulatory Visit: Payer: Self-pay | Admitting: *Deleted

## 2019-12-10 ENCOUNTER — Encounter: Payer: Self-pay | Admitting: Family Medicine

## 2019-12-10 DIAGNOSIS — E119 Type 2 diabetes mellitus without complications: Secondary | ICD-10-CM

## 2019-12-10 MED ORDER — TRULICITY 1.5 MG/0.5ML ~~LOC~~ SOAJ: SUBCUTANEOUS | 0 refills | Status: DC

## 2019-12-11 MED FILL — TRULICITY 1.5 MG/0.5 ML PEN: 1.5 | 28 days supply | Qty: 2 | Fill #0

## 2019-12-11 MED FILL — FREESTYLE LITE TEST STRIP: 50 days supply | Qty: 100 | Fill #1

## 2019-12-19 NOTE — Patient Instructions (Addendum)
  HEALTH MAINTENANCE RECOMMENDATIONS:  It is recommended that you get at least 30 minutes of aerobic exercise at least 5 days/week (for weight loss, you may need as much as 60-90 minutes). This can be any activity that gets your heart rate up. This can be divided in 10-15 minute intervals if needed, but try and build up your endurance at least once a week.  Weight bearing exercise is also recommended twice weekly.  Eat a healthy diet with lots of vegetables, fruits and fiber.  "Colorful" foods have a lot of vitamins (ie green vegetables, tomatoes, red peppers, etc).  Limit sweet tea, regular sodas and alcoholic beverages, all of which has a lot of calories and sugar.  Up to 1 alcoholic drink daily may be beneficial for women (unless trying to lose weight, watch sugars).  Drink a lot of water.  Calcium recommendations are 1200-1500 mg daily (1500 mg for postmenopausal women or women without ovaries), and vitamin D 1000 IU daily.  This should be obtained from diet and/or supplements (vitamins), and calcium should not be taken all at once, but in divided doses.  Monthly self breast exams and yearly mammograms for women over the age of 68 is recommended.  Sunscreen of at least SPF 30 should be used on all sun-exposed parts of the skin when outside between the hours of 10 am and 4 pm (not just when at beach or pool, but even with exercise, golf, tennis, and yard work!)  Use a sunscreen that says "broad spectrum" so it covers both UVA and UVB rays, and make sure to reapply every 1-2 hours.  Remember to change the batteries in your smoke detectors when changing your clock times in the spring and fall. Carbon monoxide detectors are recommended for your home.  Use your seat belt every time you are in a car, and please drive safely and not be distracted with cell phones and texting while driving.  When you need your next refill of Tamoxifen, remind me that you only need 4 additional months (to finish in  08/2020).  Continue to work hard on healthy diet, portion control, limiting snacking and late-night eating, and weight loss.  Let me know if you would be interested in a sleep study, or if you have any more useful information regarding these fatigue episodes.

## 2019-12-19 NOTE — Progress Notes (Signed)
Chief Complaint  Patient presents with  . Annual Exam    fasting annual exam, no pap. No concerns. Needs refills on lisinopril, jardiance, metformin and trulicity.     Cindy Carey is a 64 y.o. female who presents for a complete physical.  She has the following concerns:  Recheck lesion on R wrist, thinks it looks different.  Periods of 48 hours of unusual fatigue, since December.  Occurs every 3 weeks, and she can't figure out a cause. 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 No known snoring. No known triggers.  Diabetes: Currently on Trulicity (9.4HW/TU), Metformin (2037m/d), and Jardiance 178mtolerating without side effects. Sugars have been running92-100 fasting, 120-140 random Denies hypoglycemia Last eye exam: 10/2019 Checks feet regularly without concerns. Lab Results  Component Value Date   HGBA1C 6.6 (H) 07/03/2019   Hypertension follow-up: Blood pressures are 110/70 when checked at work. Denies dizziness, headaches, chest pain.   Denies side effects of medications.  She has leg cramps intermittently at night, related to which shoes she wore all day.  Leg cramps with long car rides if they don't stop.  In September she reported using 1/4 to 1/2 tablet of flexeril every night, which gave her a good night sleep. Realized that she was waking up with pain. She now uses flexeril if needed for R shoulder pain, when muscles are very tight, at bedtime, or else she won't sleep well.  She uses Tylenol before bedtime for other pains (back, finger pain).   Migraines--At her last visit (06/2019) she reported having increased frequency of migraines, once a week (with aura), using ibuprofen with aura, phenergan if that didn't work.  She previously took topiramate, stopped due to hair thinning. Headaches have improved, none since December. Continues on Mg.  Hyperlipidemia follow-up: Patient is reportedly following a  low-fat, low cholesterol diet. Compliant with medications (simvastatin 4066mand denies medication side effects. Lab Results  Component Value Date   CHOL 120 12/14/2018   HDL 58 12/14/2018   LDLCALC 42 12/14/2018   TRIG 100 12/14/2018   CHOLHDL 2.1 12/14/2018   Non-obstructive CAD. Calcium score of 1. She continues to take ASA 56m7mily.  LDL is at goal on statin. She last saw Dr. RandOval Linsey10/2020 and no changes were made.  Left breast intraductal papilloma with atypical ductal hyperplasia diagnosed 07/15/2015; status post lumpectomy 07/23/2015. Due to lifetime risk of breast cancer of 25%, she is on risk lowering therapy with Tamoxifen. She last saw Dr. GudeLindi Adiet in 02/2018, and agreed last year that we could continue to fill her Tamoxifen until she reached her 5 year treatment mark,which will be 08/2020.She is tolerating tamoxifen well, withouthot flashes or myalgias.  Obesity:  She was deemed ineligible for bariatric surgery by her insurance.  She has tried multiple weight loss medications, and was not successful in losing weight with [facility].  She tries to follow a healthy diet.   Current exercise includes walking 30 minutes with the dog. Sugar-free popcicles for dessert.  She has a lot of stress related to her job, especially when taking call.  Sometimes eats late at night.  Immunization History  Administered Date(s) Administered  . Influenza-Unspecified 06/27/2015, 07/03/2017, 07/05/2018  . PFIZER SARS-COV-2 Vaccination 10/20/2019, 11/10/2019  . Pneumococcal Polysaccharide-23 03/31/2016  . Tdap 07/03/2015  . Zoster Recombinat (Shingrix) 12/07/2017, 02/10/2018   Gets flu shots yearly at work Last Pap smear:  per GYN, s/p hysterectomy. Last saw Dr. Talbert Nan 09/2018, appt scheduled for 01/2020 Last mammogram:07/2019 Last colonoscopy: 02/2018, hyperplastic polyp Dr. Collene Mares Last DEXA:09/2017 T-1.3 at L fem neck Dentist: twice yearly Ophtho:  yearly Exercise: walks 30 minutes daily with the dog. No weight-bearing exercise.   Vitamin D-OH 41.4 in 03/2019   PMH, PSH, SH and FH were reviewed and updated  Outpatient Encounter Medications as of 12/20/2019  Medication Sig Note  . aspirin EC 81 MG tablet Take 81 mg by mouth daily.   . Calcium Carbonate-Vit D-Min (CALCIUM 600+D PLUS MINERALS) 600-400 MG-UNIT TABS Take 1 tablet by mouth 2 (two) times daily.   . Cholecalciferol (VITAMIN D3) 125 MCG (5000 UT) CAPS Take 1 capsule by mouth daily.   . cyclobenzaprine (FLEXERIL) 10 MG tablet Take 1 tablet (10 mg total) by mouth 3 (three) times daily as needed for muscle spasms.   . Dulaglutide (TRULICITY) 1.5 YH/9.0BP SOPN INJECT 1.5 MG INTO THE SKIN EVERY 7 DAYS.   [name] empagliflozin (JARDIANCE) 10 MG TABS tablet Take 10 mg by mouth daily with breakfast.   . glucose blood test strip 1 each by Other route 2 (two) times daily. Test BID Dx E11.9   . glucose monitoring kit (FREESTYLE) monitoring kit 1 each by Does not apply route as needed for other.   . Lancets (FREESTYLE) lancets 1 each by Other route 2 (two) times daily. Test twice daily Dx:E11.9   . lisinopril (ZESTRIL) 10 MG tablet Take 1 tablet (10 mg total) by mouth daily.   . Magnesium 250 MG TABS Take 250 mg by mouth daily. 03/31/2016: Takes for migraine prevention  . metFORMIN (GLUCOPHAGE-XR) 500 MG 24 hr tablet Take 2 tablets (1,000 mg total) by mouth 2 (two) times daily.   [name] METRONIDAZOLE, TOPICAL, 0.75 % LOTN Apply 1 application topically 2 (two) times daily.   . Multiple Vitamins-Minerals (MULTIVITAMIN PO) Take 1 tablet by mouth daily. Reported on 03/31/2016   . simvastatin (ZOCOR) 40 MG tablet TAKE 1 TABLET (40 MG TOTAL) BY MOUTH EVERY EVENING.   . tamoxifen (NOLVADEX) 20 MG tablet Take 1 tablet (20 mg total) by mouth daily.   [name] EPINEPHrine 0.3 mg/0.3 mL IJ SOAJ injection Inject 0.3 mLs (0.3 mg total) into the muscle once as needed for anaphylaxis. Reported on 04/30/2016 (Patient not taking:  Reported on 12/20/2019)   . promethazine (PHENERGAN) 25 MG tablet Take 1 tablet (25 mg total) by mouth every 8 (eight) hours as needed for nausea. (Patient not taking: Reported on 12/20/2019)   . [DISCONTINUED] glucose blood test strip    . [DISCONTINUED] Lancets (FREESTYLE) lancets     No facility-administered encounter medications on file as of 12/20/2019.   Allergies  Allergen Reactions  . Bee Venom Anaphylaxis  . Shellfish Allergy Anaphylaxis  . Invokana [Canagliflozin] Itching  . Penicillins Hives   ROS: The patient denies anorexia, fever, vision changes, decreased hearing, ear pain, sore throat, breast concerns, chest pain, palpitations, dizziness, syncope, dyspnea on exertion, cough, swelling, nausea, vomiting, diarrhea, constipation, abdominal pain, melena, hematochezia, indigestion/heartburn, hematuria, incontinence (occ with sneeze), dysuria, vaginal bleeding, discharge, odor, genital lesions, numbness, tingling, weakness, tremor, suspicious skin lesions, depression, anxiety, abnormal bleeding/bruising, or enlarged lymph nodes. L>R knee pain, depends on the shoes she wears. Worse with stairs/hills, not a problem when walking dog. Migraines are infrequent Lesion at right wrist--changed slightly in color, sees an area that is slightly pink.   PHYSICAL EXAM:  BP 122/70   Pulse 80   Temp 98  F (36.7 C) (Other (Comment))   Ht 5' 3"  (1.6 m)   Wt 256 lb 12.8 oz (116.5 kg)   LMP 10/18/1982   BMI 45.49 kg/m   Wt Readings from Last 3 Encounters:  12/20/19 256 lb 12.8 oz (116.5 kg)  08/14/19 265 lb (120.2 kg)  06/27/19 258 lb (117 kg)   Wt 263# at her physical last year  General Appearance:  Alert, cooperative, no distress, appears stated age. She did not undress into gown for exam.  Head:  Normocephalic, without obvious abnormality, atraumatic  Eyes:  PERRL, conjunctiva/corneas clear, EOM's intact, fundi benign  Ears:  Normal TM's and external ear canals  Nose: Not  examined, wearing mask due to COVID-19 pandemic  Throat: Not examined, wearing mask due to COVID-19 pandemic  Neck: Supple, no lymphadenopathy; thyroid: no enlargement/tenderness/ nodules; no carotid bruit or JVD  Back:  Spine nontender, no curvature, ROM normal, no CVAtenderness  Lungs:  Clear to auscultation bilaterally without wheezes, rales orronchi; respirations unlabored  Chest Wall:  No tenderness or deformity  Heart:  Regular rate and rhythm, S1 and S2 normal, no murmur, rub or gallop  Breast Exam:  Deferred to GYN  Abdomen:  Soft, non-tender, nondistended, normoactive bowel sounds, no masses, no hepatosplenomegaly  Genitalia:  Deferred to GYN     Extremities: No clubbing, cyanosis or edema. Normal diabetic foot exam.   Pulses: 2+ and symmetric all extremities  Skin: Skin color, texture, turgor normal, no rashes or lesions. Exam is limited due to not changing into gown. Some diffuse hair thinning central anterior scalp. Right dorsum of wrist--SK with some darker pigmentation centrally.  0.75 x 0.7cm. Overall shape is atypical--almost could like like 2 round and 1 oval coalesced (could almost seem like MickeyMouse shape).  Overall size hasn't changed from last year.  There is slight pink just at the distal edge of the SK in the surrounding skin (like it got irritated).  Lymph nodes: Cervical, supraclavicular, and axillary nodes normal  Neurologic: Normal strength, sensation and gait; reflexes 2+ and symmetric throughout  Psych: Normal mood, affect, hygiene and grooming.  Diabetic foot exam performed, normal.   Lab Results  Component Value Date   HGBA1C 6.5 (A) 12/20/2019     ASSESSMENT/PLAN:  Annual physical exam - Plan: POCT Urinalysis DIP (Proadvantage Device), Lipid panel, Comprehensive metabolic panel, CBC with Differential/Platelet, TSH  Diabetes mellitus with coincident hypertension ([place]) - well controlled on  current regimen - Plan: HgB A1c, Comprehensive metabolic panel, TSH, Microalbumin / creatinine urine ratio  Essential hypertension - well controlled - Plan: Comprehensive metabolic panel  Class 3 severe obesity due to excess calories with serious comorbidity and body mass index (BMI) of 45.0 to 49.9 in adult [facility]) - counseled re: diet, portions, minimizing late night eating, exercise. Wt loss encouraged  Migraine with typical aura - infrequent  Vitamin D deficiency - continue supplements  Thrombocytopenia (HCC) - no change in bruising, no bleeding; due for recheck - Plan: CBC with Differential/Platelet  Atypical ductal hyperplasia of left breast - continue tamoxifen through 08/2020 (will need refill in [place])  Mild CAD - cont ASA, statin, BP and DM control.  Hyperlipidemia, unspecified hyperlipidemia type - at goal on statin, continue - Plan: Lipid panel  Medication monitoring encounter - Plan: Lipid panel, Comprehensive metabolic panel, Microalbumin / creatinine urine ratio  Type 2 diabetes mellitus without complication, without long-term current use of insulin (HCC) - Plan: metFORMIN (GLUCOPHAGE-XR) 500 MG 24 hr tablet, empagliflozin (JARDIANCE) 10 MG TABS  tablet, Dulaglutide (TRULICITY) 1.5 AX/6.5VV SOPN   A1c CBC, c-met, lipids, TSH, urine  microalb  Tamoxifen started 08/2015. Treat through 08/2020?  Discussed monthly self breast exams and yearly mammograms; at least 30 minutes of aerobic activity at least 5 days/week, weight-bearing exercise; proper sunscreen use reviewed; healthy diet, including goals of calcium and vitamin D intake and alcohol recommendations (less than or equal to 1 drink/day) reviewed; regular seatbelt use; changing batteries in smoke detectors. Immunization recommendations discussed--continue yearly flu shots.Colonoscopy recommendations reviewed, UTD  F/u 6 mos

## 2019-12-20 ENCOUNTER — Other Ambulatory Visit: Payer: Self-pay

## 2019-12-20 ENCOUNTER — Ambulatory Visit (INDEPENDENT_AMBULATORY_CARE_PROVIDER_SITE_OTHER): Payer: No Typology Code available for payment source | Admitting: Family Medicine

## 2019-12-20 ENCOUNTER — Encounter: Payer: Self-pay | Admitting: Family Medicine

## 2019-12-20 VITALS — BP 122/70 | HR 80 | Temp 98.0°F | Ht 63.0 in | Wt 256.8 lb

## 2019-12-20 DIAGNOSIS — I10 Essential (primary) hypertension: Secondary | ICD-10-CM

## 2019-12-20 DIAGNOSIS — Z0000 Encounter for general adult medical examination without abnormal findings: Secondary | ICD-10-CM | POA: Diagnosis not present

## 2019-12-20 DIAGNOSIS — N6092 Unspecified benign mammary dysplasia of left breast: Secondary | ICD-10-CM

## 2019-12-20 DIAGNOSIS — G43109 Migraine with aura, not intractable, without status migrainosus: Secondary | ICD-10-CM

## 2019-12-20 DIAGNOSIS — D696 Thrombocytopenia, unspecified: Secondary | ICD-10-CM

## 2019-12-20 DIAGNOSIS — Z5181 Encounter for therapeutic drug level monitoring: Secondary | ICD-10-CM

## 2019-12-20 DIAGNOSIS — E559 Vitamin D deficiency, unspecified: Secondary | ICD-10-CM

## 2019-12-20 DIAGNOSIS — I2510 Atherosclerotic heart disease of native coronary artery without angina pectoris: Secondary | ICD-10-CM

## 2019-12-20 DIAGNOSIS — E785 Hyperlipidemia, unspecified: Secondary | ICD-10-CM

## 2019-12-20 DIAGNOSIS — Z6841 Body Mass Index (BMI) 40.0 and over, adult: Secondary | ICD-10-CM

## 2019-12-20 DIAGNOSIS — E119 Type 2 diabetes mellitus without complications: Secondary | ICD-10-CM

## 2019-12-20 LAB — POCT URINALYSIS DIP (PROADVANTAGE DEVICE)
Blood, UA: NEGATIVE
Glucose, UA: 500 mg/dL — AB
Leukocytes, UA: NEGATIVE
Nitrite, UA: NEGATIVE
Protein Ur, POC: NEGATIVE mg/dL
Specific Gravity, Urine: 1.025
Urobilinogen, Ur: NEGATIVE
pH, UA: 6 (ref 5.0–8.0)

## 2019-12-20 LAB — POCT GLYCOSYLATED HEMOGLOBIN (HGB A1C): Hemoglobin A1C: 6.5 % — AB (ref 4.0–5.6)

## 2019-12-20 MED ORDER — JARDIANCE 10 MG PO TABS: 10.0000 mg | ORAL_TABLET | Freq: Every day | ORAL | 1 refills | Status: DC

## 2019-12-20 MED ORDER — METFORMIN HCL ER 500 MG PO TB24: 1000.0000 mg | ORAL_TABLET | Freq: Two times a day (BID) | ORAL | 1 refills | Status: DC

## 2019-12-20 MED ORDER — TRULICITY 1.5 MG/0.5ML ~~LOC~~ SOAJ: SUBCUTANEOUS | 1 refills | Status: DC

## 2019-12-20 MED FILL — JARDIANCE 10 MG TABLET: 10 | 90 days supply | Qty: 90 | Fill #0

## 2019-12-20 MED FILL — metFORMIN HCL ER 500 MG TB2: 500 | 90 days supply | Qty: 360 | Fill #0

## 2019-12-21 LAB — COMPREHENSIVE METABOLIC PANEL
ALT: 22 IU/L (ref 0–32)
AST: 41 IU/L — ABNORMAL HIGH (ref 0–40)
Albumin/Globulin Ratio: 1.3 (ref 1.2–2.2)
Albumin: 3.9 g/dL (ref 3.8–4.8)
Alkaline Phosphatase: 109 IU/L (ref 39–117)
BUN/Creatinine Ratio: 18 (ref 12–28)
BUN: 15 mg/dL (ref 8–27)
Bilirubin Total: 0.5 mg/dL (ref 0.0–1.2)
CO2: 22 mmol/L (ref 20–29)
Calcium: 9.7 mg/dL (ref 8.7–10.3)
Chloride: 104 mmol/L (ref 96–106)
Creatinine, Ser: 0.85 mg/dL (ref 0.57–1.00)
GFR calc Af Amer: 85 mL/min/{1.73_m2} (ref >59)
GFR calc non Af Amer: 74 mL/min/{1.73_m2} (ref >59)
Globulin, Total: 3 g/dL (ref 1.5–4.5)
Glucose: 112 mg/dL — ABNORMAL HIGH (ref 65–99)
Potassium: 4.8 mmol/L (ref 3.5–5.2)
Sodium: 139 mmol/L (ref 134–144)
Total Protein: 6.9 g/dL (ref 6.0–8.5)

## 2019-12-21 LAB — CBC WITH DIFFERENTIAL/PLATELET
Basophils Absolute: 0 10*3/uL (ref 0.0–0.2)
Basos: 0 %
EOS (ABSOLUTE): 0.1 10*3/uL (ref 0.0–0.4)
Eos: 2 %
Hematocrit: 38.6 % (ref 34.0–46.6)
Hemoglobin: 11.9 g/dL (ref 11.1–15.9)
Immature Grans (Abs): 0 10*3/uL (ref 0.0–0.1)
Immature Granulocytes: 0 %
Lymphocytes Absolute: 2.8 10*3/uL (ref 0.7–3.1)
Lymphs: 36 %
MCH: 27.8 pg (ref 26.6–33.0)
MCHC: 30.8 g/dL — ABNORMAL LOW (ref 31.5–35.7)
MCV: 90 fL (ref 79–97)
Monocytes Absolute: 0.8 10*3/uL (ref 0.1–0.9)
Monocytes: 10 %
Neutrophils Absolute: 4 10*3/uL (ref 1.4–7.0)
Neutrophils: 52 %
Platelets: 94 10*3/uL — CL (ref 150–450)
RBC: 4.28 x10E6/uL (ref 3.77–5.28)
RDW: 15 % (ref 11.7–15.4)
WBC: 7.8 10*3/uL (ref 3.4–10.8)

## 2019-12-21 LAB — LIPID PANEL
Chol/HDL Ratio: 1.7 ratio (ref 0.0–4.4)
Cholesterol, Total: 131 mg/dL (ref 100–199)
HDL: 75 mg/dL (ref >39)
LDL Chol Calc (NIH): 38 mg/dL (ref 0–99)
Triglycerides: 102 mg/dL (ref 0–149)
VLDL Cholesterol Cal: 18 mg/dL (ref 5–40)

## 2019-12-21 LAB — MICROALBUMIN / CREATININE URINE RATIO
Creatinine, Urine: 161.8 mg/dL
Microalb/Creat Ratio: 5 mg/g creat (ref 0–29)
Microalbumin, Urine: 8 ug/mL

## 2019-12-21 LAB — TSH: TSH: 2.67 u[IU]/mL (ref 0.450–4.500)

## 2019-12-28 MED FILL — SIMVASTATIN 40 MG TABLET: 40 | 90 days supply | Qty: 90 | Fill #1

## 2020-01-01 IMAGING — RF DG UGI W/ KUB
10 of 12 series · 12 of 24 positions shown · non-contrast
Comparison: None.

CLINICAL DATA: Preop gastric sleeve.  Morbid obesity

EXAM:
UPPER GI SERIES WITH KUB
TECHNIQUE: After obtaining a scout radiograph a routine upper GI series was
performed using thin barium
FLUOROSCOPY TIME:  Fluoroscopy Time:  1.6 min
Radiation Exposure Index (if provided by the fluoroscopic device):
45.8 mGy
Number of Acquired Spot Images: 0

[Series 2: cp_standard · 0.34mm/px · 1 of 172 frames shown (1 of 10)]
[frame 63/172]
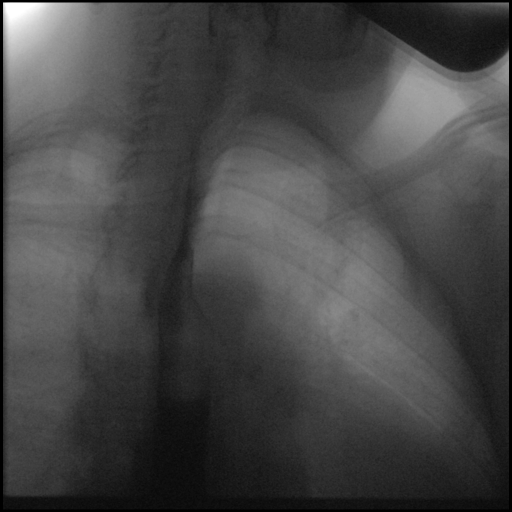

[Series 3: cp_standard · 0.34mm/px · 1 of 84 frames shown (2 of 10)]
[frame 13/84]
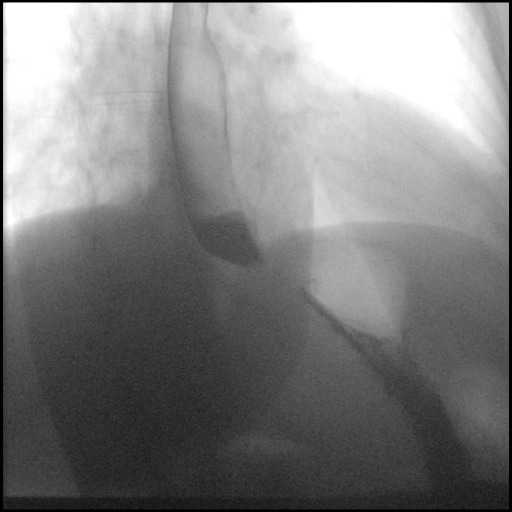

[Series 4: cp_standard · 0.18mm/px · 1 of 1 slices shown (3 of 10)]
[im 1/1]
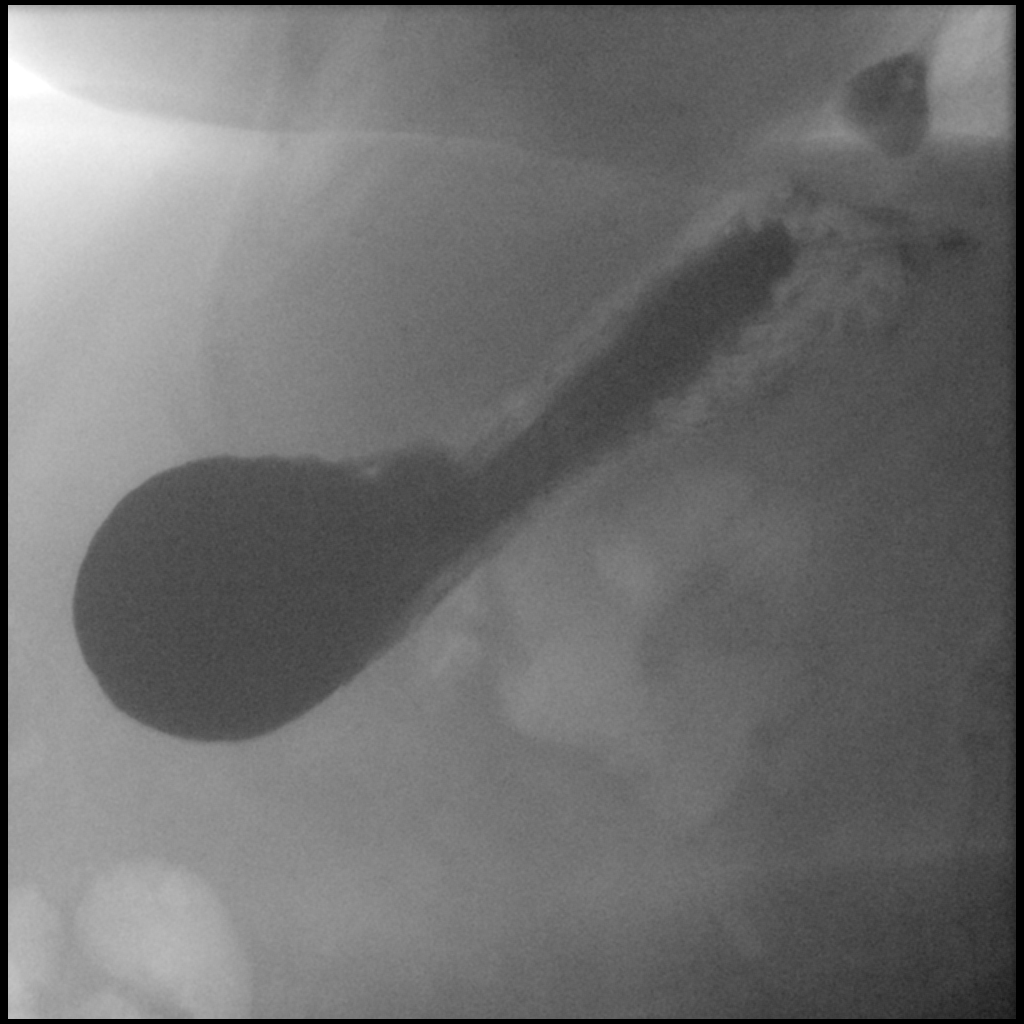

[Series 7: cp_standard · 0.37mm/px · 1 of 139 frames shown (4 of 10)]
[frame 10/139]
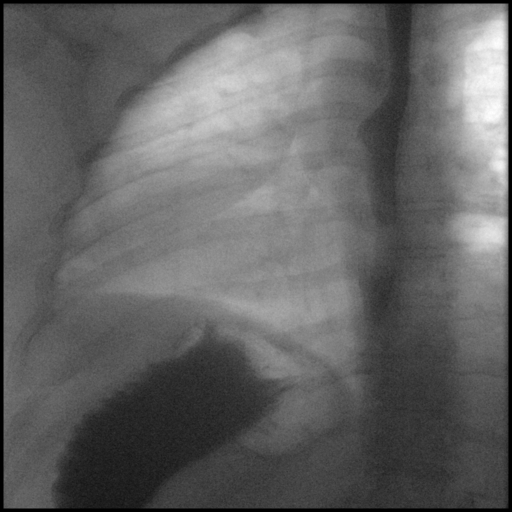

[Series 8: cp_standard · 0.37mm/px · 2 of 136 frames shown (5 of 10)]
[frame 7/136]
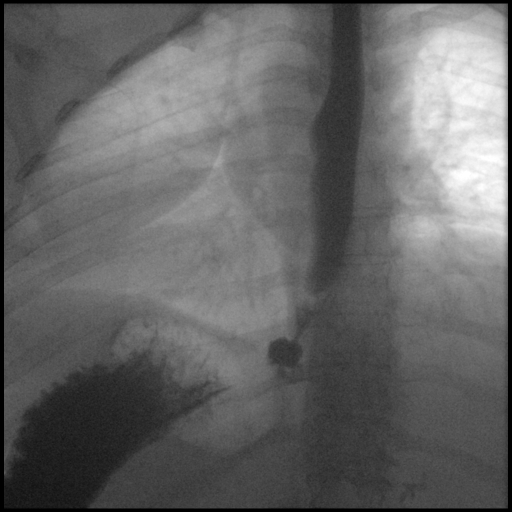
[frame 116/136]
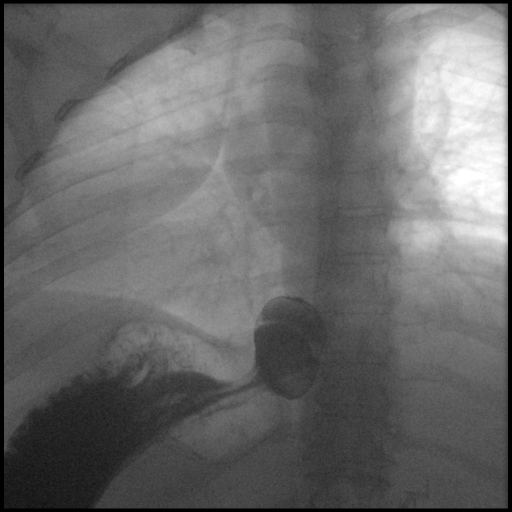

[Series 9: cp_standard · 0.37mm/px · 1 of 100 frames shown (6 of 10)]
[frame 100/100]
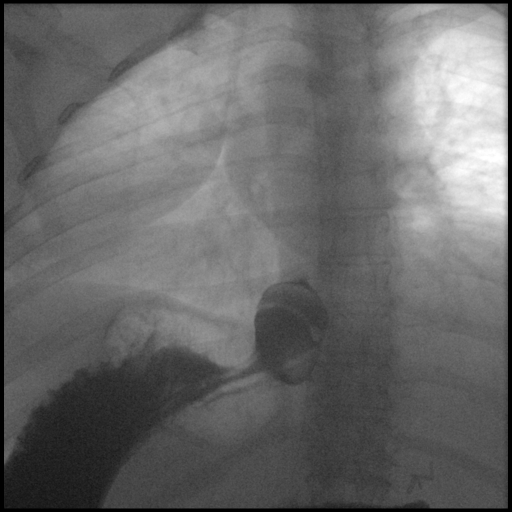

[Series 10: cp_standard · 0.37mm/px · 1 of 66 frames shown (7 of 10)]
[frame 34/66]
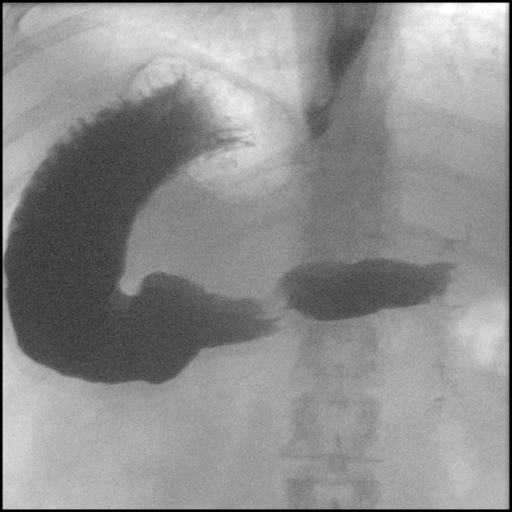

[Series 11: cp_standard · 0.37mm/px · 1 of 125 frames shown (8 of 10)]
[frame 63/125]
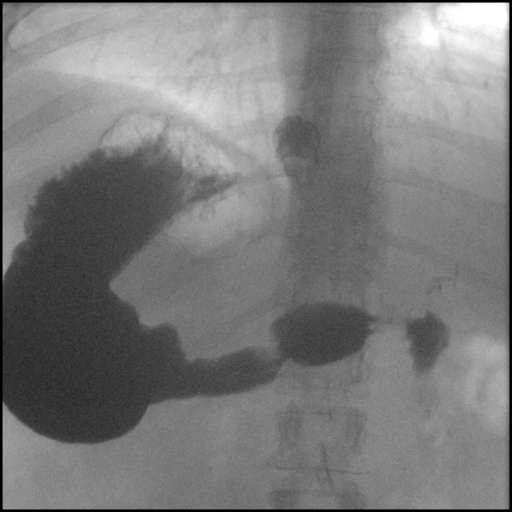

[Series 13: cp_standard · 0.37mm/px · 1 of 96 frames shown (9 of 10)]
[frame 15/96]
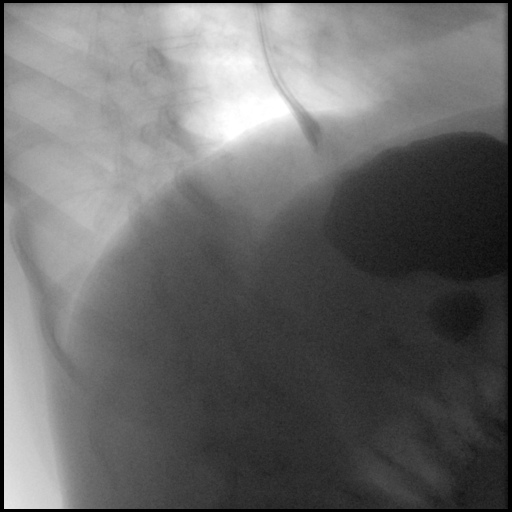

[Series 14: cp_standard · 0.37mm/px · 2 of 107 frames shown (10 of 10)]
[frame 10/107]
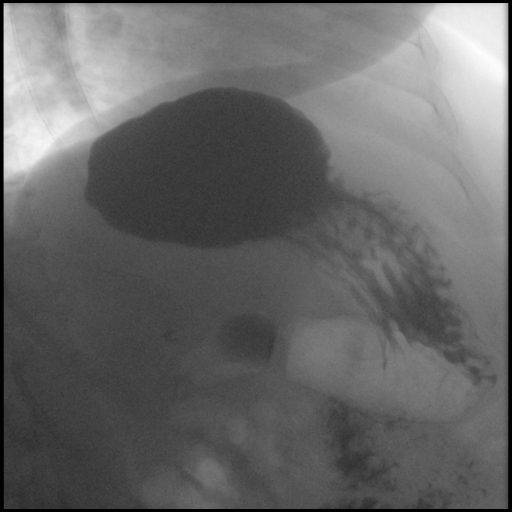
[frame 91/107]
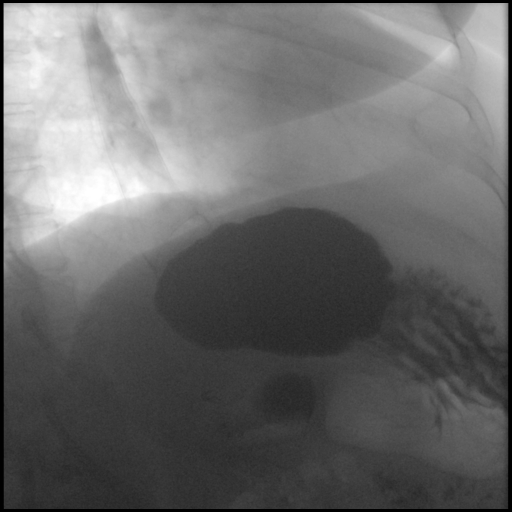

[12 of 24 positions shown; findings below may reference images not displayed]

FINDINGS: Scout film demonstrates normal bowel gas pattern. Prior
cholecystectomy.

Fluoroscopic evaluation of swallowing demonstrates normal esophageal
peristalsis. No fixed stricture, fold thickening or mass. No reflux
with the water siphon maneuver. Very small hiatal hernia.

Stomach, duodenal bulb and duodenal sweep are unremarkable. No
ulceration, mass or fold thickening.
IMPRESSION: Very small hiatal hernia.  Otherwise unremarkable upper GI.

## 2020-01-02 MED FILL — TRULICITY 1.5 MG/0.5 ML PEN: 1.5 | 28 days supply | Qty: 2 | Fill #0

## 2020-01-29 ENCOUNTER — Encounter: Payer: Self-pay | Admitting: Family Medicine

## 2020-01-30 ENCOUNTER — Other Ambulatory Visit: Payer: Self-pay | Admitting: *Deleted

## 2020-01-30 DIAGNOSIS — R21 Rash and other nonspecific skin eruption: Secondary | ICD-10-CM

## 2020-01-31 MED FILL — metroNIDAZOLE 0.75 % GEL: 0.75 | 30 days supply | Qty: 45 | Fill #0

## 2020-01-31 MED FILL — DOXYCYCLINE HYC 100 MG CAPS: 100 | 30 days supply | Qty: 60 | Fill #0

## 2020-02-04 MED FILL — TRULICITY 1.5 MG/0.5 ML PEN: 1.5 | 84 days supply | Qty: 6 | Fill #1

## 2020-02-05 ENCOUNTER — Other Ambulatory Visit: Payer: Self-pay

## 2020-02-05 MED FILL — TAMOXIFEN 20 MG TABLET: 20 | 90 days supply | Qty: 90 | Fill #3

## 2020-02-05 NOTE — Progress Notes (Signed)
64 y.o. G0P0000 Married White or Caucasian Not Hispanic or Latino female here for annual exam.  H/O TAH/BSO at 25 for severe endometriosis.  No vaginal bleeding.  She has mild GSI, overall tolerable. No bowel issues.   HgbA1C 6.5%    Patient's last menstrual period was 10/18/1982.          Sexually active: No.  The current method of family planning is status post hysterectomy.    Exercising: No.  The patient does not participate in regular exercise at present. Smoker:  no  Health Maintenance: Pap:  2011 History of abnormal Pap:  no MMG:  07/24/19 density b Bi-rads 1 neg  Colonoscopy:02/20/2018 polyp, f/u 10 years BMD:09/30/2017 Osteopenia T score of -1.3, FRAX 12.7/0.4% TDaP:2016    reports that she has never smoked. She has never used smokeless tobacco. She reports that she does not drink alcohol or use drugs. She is a Pediatric NP, neurology and complex care. She is now having to take call for 1 out of 3 weeks, exhausted.   Past Medical History:  Diagnosis Date  . Anemia   . Asthma   . Blood transfusion without reported diagnosis 1983  . Diabetes mellitus without complication (HCC) 2010   T2DM  . Dyspareunia   . Exercise-induced asthma    worse in winter  . H/O bone density study 2013  . H/O cold sores   . H/O colonoscopy 2009  . History of endometriosis   . Hyperlipidemia   . Hypertension   . Injury of tendon of rotator cuff 2015   gym injury (right)  . Knee pain 08/2015   L>R  . Migraine with typical aura   . Mild CAD 04/04/2018   Mild disease on coronary CT-A 01/2018.  . Morbid obesity (HCC) 09/04/12   BMI 49.4 kg/m^2   . Osteoarthritis   . Plantar fasciitis, left 2019  . Trigger finger of left thumb 08/2018   injected by Dr. Kuzma 09/18/18    Past Surgical History:  Procedure Laterality Date  . BREAST BIOPSY  2010  . BREAST LUMPECTOMY  2010  . BREAST LUMPECTOMY WITH RADIOACTIVE SEED LOCALIZATION Left 07/23/2015   Procedure: LEFT BREAST SEED GUIDED  EXCISION;  Surgeon: Matthew Wakefield, MD;  Location: [facility];  Service: General;  Laterality: Left;  . BREATH TEK H PYLORI  08/28/2012   Procedure: BREATH TEK H PYLORI;  Surgeon: Matthew B Martin, MD;  Location: [facility];  Service: General;  Laterality: N/A;  . CHOLECYSTECTOMY  2007  . EXPLORATORY LAPAROTOMY  1983   endometriosis  . GANGLION CYST EXCISION Left 1999   Foot  . TOTAL ABDOMINAL HYSTERECTOMY W/ BILATERAL SALPINGOOPHORECTOMY  1984   endometriosis (and appendectomy)    Current Outpatient Medications  Medication Sig Dispense Refill  . aspirin EC 81 MG tablet Take 81 mg by mouth daily.    . Calcium Carbonate-Vit D-Min (CALCIUM 600+D PLUS MINERALS) 600-400 MG-UNIT TABS Take 1 tablet by mouth 2 (two) times daily.    . Cholecalciferol (VITAMIN D3) 125 MCG (5000 UT) CAPS Take 1 capsule by mouth daily.    . cyclobenzaprine (FLEXERIL) 10 MG tablet Take 1 tablet (10 mg total) by mouth 3 (three) times daily as needed for muscle spasms. 30 tablet 0  . Dulaglutide (TRULICITY) 1.5 MG/0.5ML SOPN INJECT 1.5 MG INTO THE SKIN EVERY 7 DAYS. 6 mL 1  . empagliflozin (JARDIANCE) 10 MG TABS tablet Take 10 mg by mouth daily with breakfast. 90 tablet 1  . EPINEPHrine   0.3 mg/0.3 mL IJ SOAJ injection Inject 0.3 mLs (0.3 mg total) into the muscle once as needed for anaphylaxis. Reported on 04/30/2016 1 each 0  . glucose blood test strip 1 each by Other route 2 (two) times daily. Test BID Dx E11.9 100 each 2  . glucose monitoring kit (FREESTYLE) monitoring kit 1 each by Does not apply route as needed for other. 1 each 0  . Lancets (FREESTYLE) lancets 1 each by Other route 2 (two) times daily. Test twice daily Dx:E11.9 100 each 2  . lisinopril (ZESTRIL) 10 MG tablet Take 1 tablet (10 mg total) by mouth daily. 90 tablet 0  . Magnesium 250 MG TABS Take 250 mg by mouth daily.    . metFORMIN (GLUCOPHAGE-XR) 500 MG 24 hr tablet Take 2 tablets (1,000 mg total) by mouth 2 (two) times daily. 360  tablet 1  . METRONIDAZOLE, TOPICAL, 0.75 % LOTN Apply 1 application topically 2 (two) times daily. 59 mL 12  . Multiple Vitamins-Minerals (MULTIVITAMIN PO) Take 1 tablet by mouth daily. Reported on 03/31/2016    . promethazine (PHENERGAN) 25 MG tablet Take 1 tablet (25 mg total) by mouth every 8 (eight) hours as needed for nausea. 30 tablet 0  . simvastatin (ZOCOR) 40 MG tablet TAKE 1 TABLET (40 MG TOTAL) BY MOUTH EVERY EVENING. 90 tablet 1  . tamoxifen (NOLVADEX) 20 MG tablet Take 1 tablet (20 mg total) by mouth daily. 90 tablet 3  . doxycycline (VIBRAMYCIN) 100 MG capsule Take 100 mg by mouth 2 (two) times daily.     No current facility-administered medications for this visit.    Family History  Problem Relation Age of Onset  . Diabetes Father   . Heart attack Father 3  . Heart disease Father   . Hypertension Father   . Peripheral Artery Disease Father        stents at 62  . Diabetes Paternal Grandfather   . Heart disease Paternal Grandfather   . Osteoporosis Maternal Grandmother   . Heart failure Maternal Grandmother   . Thyroid disease Mother        hypothyroidism  . Heart disease Mother 75       CABG at 64  . Hypertension Mother   . Diverticulitis Mother 44       perforation, required surgery  . Pneumonia Sister   . Cancer Maternal Grandfather        lung  . Dementia Paternal Grandmother   . Cancer Paternal Grandmother        melanoma on her foot (not related to death)  . Breast cancer Paternal Aunt        53's  . Cancer Maternal Aunt        lung  . Cancer Maternal Uncle        lung  . Breast cancer Cousin        mets to lung  . Congestive Heart Failure Maternal Uncle   . Breast cancer Cousin     Review of Systems  All other systems reviewed and are negative.   Exam:   BP 118/68   Pulse 82   Temp (!) 97.5 F (36.4 C)   Ht 5' 3" (1.6 m)   Wt 258 lb (117 kg)   LMP 10/18/1982   SpO2 97%   BMI 45.70 kg/m   Weight change: _0 @ Height:   Height:  5' 3" (160 cm)  Ht Readings from Last 3 Encounters:  02/06/20 5' 3" (1.6 m)  12/20/19  5' 3" (1.6 m)  08/14/19 5' 3" (1.6 m)    General appearance: alert, cooperative and appears stated age Head: Normocephalic, without obvious abnormality, atraumatic Neck: no adenopathy, supple, symmetrical, trachea midline and thyroid normal to inspection and palpation Lungs: clear to auscultation bilaterally Cardiovascular: regular rate and rhythm Breasts: normal appearance, no masses or tenderness Abdomen: soft, non-tender; non distended,  no masses,  no organomegaly Extremities: extremities normal, atraumatic, no cyanosis or edema Skin: Skin color, texture, turgor normal. No rashes or lesions Lymph nodes: Cervical, supraclavicular, and axillary nodes normal. No abnormal inguinal nodes palpated Neurologic: Grossly normal   Pelvic: External genitalia:  no lesions              Urethra:  normal appearing urethra with no masses, tenderness or lesions              Bartholins and Skenes: normal                 Vagina: normal appearing vagina with normal color and discharge, no lesions              Cervix: absent               Bimanual Exam:  Uterus:  uterus absent              Adnexa: no mass, fullness, tenderness               Rectovaginal: Confirms               Anus:  normal sphincter tone, no lesions  Terra Myers chaperoned for the exam.  A:  Well Woman with normal exam  Multiple medical issues, AODM (well controlled), HTN, elevated lipids, BMI 45. Managed by primary MD  P:   No pap needed  Mammogram, colonoscopy, DEXA UTD  Discussed breast self exam  Discussed calcium and vit D intake  Labs with primary   

## 2020-02-06 ENCOUNTER — Encounter: Payer: Self-pay | Admitting: Obstetrics and Gynecology

## 2020-02-06 ENCOUNTER — Ambulatory Visit (INDEPENDENT_AMBULATORY_CARE_PROVIDER_SITE_OTHER): Payer: No Typology Code available for payment source | Admitting: Obstetrics and Gynecology

## 2020-02-06 VITALS — BP 118/68 | HR 82 | Temp 97.5°F | Ht 63.0 in | Wt 258.0 lb

## 2020-02-06 DIAGNOSIS — Z6841 Body Mass Index (BMI) 40.0 and over, adult: Secondary | ICD-10-CM

## 2020-02-06 DIAGNOSIS — Z01419 Encounter for gynecological examination (general) (routine) without abnormal findings: Secondary | ICD-10-CM

## 2020-02-06 NOTE — Patient Instructions (Signed)
EXERCISE AND DIET:  We recommended that you start or continue a regular exercise program for good health. Regular exercise means any activity that makes your heart beat faster and makes you sweat.  We recommend exercising at least 30 minutes per day at least 3 days a week, preferably 4 or 5.  We also recommend a diet low in fat and sugar.  Inactivity, poor dietary choices and obesity can cause diabetes, heart attack, stroke, and kidney damage, among others.    ALCOHOL AND SMOKING:  Women should limit their alcohol intake to no more than 7 drinks/beers/glasses of wine (combined, not each!) per week. Moderation of alcohol intake to this level decreases your risk of breast cancer and liver damage. And of course, no recreational drugs are part of a healthy lifestyle.  And absolutely no smoking or even second hand smoke. Most people know smoking can cause heart and lung diseases, but did you know it also contributes to weakening of your bones? Aging of your skin?  Yellowing of your teeth and nails?  CALCIUM AND VITAMIN D:  Adequate intake of calcium and Vitamin D are recommended.  The recommendations for exact amounts of these supplements seem to change often, but generally speaking 1,200 mg of calcium (between diet and supplement) and 800 units of Vitamin D per day seems prudent. Certain women may benefit from higher intake of Vitamin D.  If you are among these women, your doctor will have told you during your visit.    PAP SMEARS:  Pap smears, to check for cervical cancer or precancers,  have traditionally been done yearly, although recent scientific advances have shown that most women can have pap smears less often.  However, every woman still should have a physical exam from her gynecologist every year. It will include a breast check, inspection of the vulva and vagina to check for abnormal growths or skin changes, a visual exam of the cervix, and then an exam to evaluate the size and shape of the uterus and  ovaries.  And after 63 years of age, a rectal exam is indicated to check for rectal cancers. We will also provide age appropriate advice regarding health maintenance, like when you should have certain vaccines, screening for sexually transmitted diseases, bone density testing, colonoscopy, mammograms, etc.   MAMMOGRAMS:  All women over 40 years old should have a yearly mammogram. Many facilities now offer a "3D" mammogram, which may cost around $50 extra out of pocket. If possible,  we recommend you accept the option to have the 3D mammogram performed.  It both reduces the number of women who will be called back for extra views which then turn out to be normal, and it is better than the routine mammogram at detecting truly abnormal areas.    COLON CANCER SCREENING: Now recommend starting at age 45. At this time colonoscopy is not covered for routine screening until 50. There are take home tests that can be done between 45-49.   COLONOSCOPY:  Colonoscopy to screen for colon cancer is recommended for all women at age 50.  We know, you hate the idea of the prep.  We agree, BUT, having colon cancer and not knowing it is worse!!  Colon cancer so often starts as a polyp that can be seen and removed at colonscopy, which can quite literally save your life!  And if your first colonoscopy is normal and you have no family history of colon cancer, most women don't have to have it again for   10 years.  Once every ten years, you can do something that may end up saving your life, right?  We will be happy to help you get it scheduled when you are ready.  Be sure to check your insurance coverage so you understand how much it will cost.  It may be covered as a preventative service at no cost, but you should check your particular policy.      Breast Self-Awareness Breast self-awareness means being familiar with how your breasts look and feel. It involves checking your breasts regularly and reporting any changes to your  health care provider. Practicing breast self-awareness is important. A change in your breasts can be a sign of a serious medical problem. Being familiar with how your breasts look and feel allows you to find any problems early, when treatment is more likely to be successful. All women should practice breast self-awareness, including women who have had breast implants. How to do a breast self-exam One way to learn what is normal for your breasts and whether your breasts are changing is to do a breast self-exam. To do a breast self-exam: Look for Changes  1. Remove all the clothing above your waist. 2. Stand in front of a mirror in a room with good lighting. 3. Put your hands on your hips. 4. Push your hands firmly downward. 5. Compare your breasts in the mirror. Look for differences between them (asymmetry), such as: ? Differences in shape. ? Differences in size. ? Puckers, dips, and bumps in one breast and not the other. 6. Look at each breast for changes in your skin, such as: ? Redness. ? Scaly areas. 7. Look for changes in your nipples, such as: ? Discharge. ? Bleeding. ? Dimpling. ? Redness. ? A change in position. Feel for Changes Carefully feel your breasts for lumps and changes. It is best to do this while lying on your back on the floor and again while sitting or standing in the shower or tub with soapy water on your skin. Feel each breast in the following way:  Place the arm on the side of the breast you are examining above your head.  Feel your breast with the other hand.  Start in the nipple area and make  inch (2 cm) overlapping circles to feel your breast. Use the pads of your three middle fingers to do this. Apply light pressure, then medium pressure, then firm pressure. The light pressure will allow you to feel the tissue closest to the skin. The medium pressure will allow you to feel the tissue that is a little deeper. The firm pressure will allow you to feel the tissue  close to the ribs.  Continue the overlapping circles, moving downward over the breast until you feel your ribs below your breast.  Move one finger-width toward the center of the body. Continue to use the  inch (2 cm) overlapping circles to feel your breast as you move slowly up toward your collarbone.  Continue the up and down exam using all three pressures until you reach your armpit.  Write Down What You Find  Write down what is normal for each breast and any changes that you find. Keep a written record with breast changes or normal findings for each breast. By writing this information down, you do not need to depend only on memory for size, tenderness, or location. Write down where you are in your menstrual cycle, if you are still menstruating. If you are having trouble noticing differences   in your breasts, do not get discouraged. With time you will become more familiar with the variations in your breasts and more comfortable with the exam. How often should I examine my breasts? Examine your breasts every month. If you are breastfeeding, the best time to examine your breasts is after a feeding or after using a breast pump. If you menstruate, the best time to examine your breasts is 5-7 days after your period is over. During your period, your breasts are lumpier, and it may be more difficult to notice changes. When should I see my health care provider? See your health care provider if you notice:  A change in shape or size of your breasts or nipples.  A change in the skin of your breast or nipples, such as a reddened or scaly area.  Unusual discharge from your nipples.  A lump or thick area that was not there before.  Pain in your breasts.  Anything that concerns you.  

## 2020-02-19 ENCOUNTER — Other Ambulatory Visit: Payer: Self-pay | Admitting: Family Medicine

## 2020-02-19 DIAGNOSIS — Z1231 Encounter for screening mammogram for malignant neoplasm of breast: Secondary | ICD-10-CM

## 2020-03-20 ENCOUNTER — Other Ambulatory Visit: Payer: Self-pay | Admitting: Family Medicine

## 2020-03-20 DIAGNOSIS — E785 Hyperlipidemia, unspecified: Secondary | ICD-10-CM

## 2020-03-20 MED FILL — SIMVASTATIN 40 MG TABLET: 40 | 90 days supply | Qty: 90 | Fill #0

## 2020-03-20 MED FILL — metFORMIN HCL ER 500 MG TB2: 500 | 90 days supply | Qty: 360 | Fill #1

## 2020-03-20 MED FILL — JARDIANCE 10 MG TABLET: 10 | 90 days supply | Qty: 90 | Fill #1

## 2020-04-03 MED FILL — metroNIDAZOLE 0.75 % GEL: 0.75 | 30 days supply | Qty: 45 | Fill #2

## 2020-05-04 ENCOUNTER — Other Ambulatory Visit: Payer: Self-pay | Admitting: Family Medicine

## 2020-05-04 DIAGNOSIS — I10 Essential (primary) hypertension: Secondary | ICD-10-CM

## 2020-05-05 ENCOUNTER — Other Ambulatory Visit: Payer: Self-pay | Admitting: Family Medicine

## 2020-05-05 DIAGNOSIS — E119 Type 2 diabetes mellitus without complications: Secondary | ICD-10-CM

## 2020-05-05 MED ORDER — LISINOPRIL 10 MG PO TABS: 10.0000 mg | ORAL_TABLET | Freq: Every day | ORAL | 0 refills | Status: DC

## 2020-05-05 MED FILL — LISINOPRIL 10 MG TABS: 10 | 90 days supply | Qty: 90 | Fill #0

## 2020-05-05 MED FILL — TRULICITY 1.5 MG/0.5 ML PEN: 1.5 | 84 days supply | Qty: 6 | Fill #0

## 2020-06-05 MED FILL — metroNIDAZOLE 0.75 % GEL: 0.75 | 30 days supply | Qty: 45 | Fill #0

## 2020-06-25 ENCOUNTER — Other Ambulatory Visit: Payer: Self-pay | Admitting: Family Medicine

## 2020-06-25 DIAGNOSIS — E785 Hyperlipidemia, unspecified: Secondary | ICD-10-CM

## 2020-06-25 DIAGNOSIS — E119 Type 2 diabetes mellitus without complications: Secondary | ICD-10-CM

## 2020-06-25 MED FILL — SIMVASTATIN 40 MG TABLET: 40 | 90 days supply | Qty: 90 | Fill #0

## 2020-06-25 MED FILL — metFORMIN HCL ER 500 MG TB2: 500 | 90 days supply | Qty: 360 | Fill #0

## 2020-06-25 MED FILL — JARDIANCE 10 MG TABLET: 10 | 90 days supply | Qty: 90 | Fill #0

## 2020-06-25 NOTE — Telephone Encounter (Signed)
Waiting to see if patient needs refills on these medications before her appointment on 9/22.

## 2020-07-08 NOTE — Progress Notes (Signed)
Chief Complaint  Patient presents with  . Diabetes    fasting med check, no concerns. Is interested in booster but would like to discuss.   . Flu Vaccine    she will get through work, first week of Oct.    Patient presents for 6 month follow-up.  At her last visit she reported having spells lasting 48 hours of unusual fatigue, since December.  She reported it occurred every 3 weeks, and she couldn't figure out a cause. 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 No known snoring. No known triggers. Today she reports that has, thankfully, completely resolved.  She reports she has had a very rough 6 months. MIL passed away in 23-Apr-2023, issues with her estate and husband's family. 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 Call was very stressful, finally realized she couldn't take it anymore, and was able to stop doing it, last call was last week.  She has also been busy with other projects. Reports she wasn't able to even really "try" regarding diet, exercise.  Diabetes: Currently on Trulicity (1.9XJ/OI),TGPQDIYME (2080m/d),and Jardiance 142molerating without side effects. "I hate the Trulicity"--bruises, is sore at injection sites.  +nausea for 1/2 day for a few days after the shot, sometimes even gagging in the trashcan, even when eating small meals, has been the same since she started the medication. Sugars have been running100-110 in the mornings, 140-150's after meals Denies hypoglycemia Last eye exam: 10/2019 Checks feet regularly without concerns. Lab Results  Component Value Date   HGBA1C 6.5 (A) 12/20/2019   Hypertension follow-up: Blood pressures are 104-110/70 when checked at work. Denies dizziness, headaches, chest pain.   Denies side effects of medications. She has leg cramps intermittently at night, related to which shoes she wore all day.  She also gets leg cramps with long car rides if they don't stop.  This is unchanged.  Migraines--She takes magnesium supplement.  She hasn't had a migraine since Easter. She uses ibuprofen at time of aura, phenergan if that doesn't work.  (Stopped topiramate in the past due to hair thinning.)  Hyperlipidemia follow-up: Patient is reportedly following a low-fat, low cholesterol diet, not great recently due to stressors. Compliant with medications (simvastatin 4068mand denies medication side effects. Lipids were at goal on last check. Lab Results  Component Value Date   CHOL 131 12/20/2019   HDL 75 12/20/2019   LDLCALC 38 12/20/2019   TRIG 102 12/20/2019   CHOLHDL 1.7 12/20/2019   Non-obstructive CAD. Calcium score of 1. She is compliant with ASA 55m3mily and statin. She last saw Dr. RandOval Linsey10/2020 and no changes were made.  Left breast intraductal papilloma with atypical ductal hyperplasia diagnosed 07/15/2015; status post lumpectomy 07/23/2015. Due to lifetime risk of breast cancer of 25%, she is on risk lowering therapy with Tamoxifen. She last saw Dr. GudeLindi Adiet in5/2019, and agreed last year that we could continue to fill her Tamoxifen until she reached her 5 year treatment mark,which will be 08/2020.She is tolerating tamoxifen well, withouthot flashes or myalgias. She states she will run 20 days short of her 5 year mark. She has mammogram scheduled.  Obesity:  She was deemed ineligible for bariatric surgery by her insurance.  She has tried multiple weight loss medications, and was not successful in losing weight with [facility].  She admits that her diet hasn't been great in the last 3-6 months.  Walks with the dog are shorter, no other exercise.  Late night eating related to stress and call (which has just recently ended).  Mild thrombocytopenia. She denies bleeding or bruising (other than minor bruising at Trulicity injection sites). Lab Results  Component Value Date   PLT 94 (LL) 12/20/2019   She uses  flexeril before bedtime when muscles are very tight, or if needed for R shoulder pain (1/2 tablet once a week), or else she won't sleep well.  She uses Tylenol before bedtime for other pains (back, finger).   PMH, PSH, SH reviewed  Outpatient Encounter Medications as of 07/09/2020  Medication Sig Note  . aspirin EC 81 MG tablet Take 81 mg by mouth daily.   . Calcium Carbonate-Vit D-Min (CALCIUM 600+D PLUS MINERALS) 600-400 MG-UNIT TABS Take 1 tablet by mouth 2 (two) times daily.   . Cholecalciferol (VITAMIN D3) 125 MCG (5000 UT) CAPS Take 1 capsule by mouth daily.   [name] glucose blood test strip 1 each by Other route 2 (two) times daily. Test BID Dx E11.9   . glucose monitoring kit (FREESTYLE) monitoring kit 1 each by Does not apply route as needed for other.   [name] JARDIANCE 10 MG TABS tablet TAKE 1 TABLET BY MOUTH DAILY WITH BREAKFAST   . Lancets (FREESTYLE) lancets 1 each by Other route 2 (two) times daily. Test twice daily Dx:E11.9   . lisinopril (ZESTRIL) 10 MG tablet Take 1 tablet (10 mg total) by mouth daily.   . Magnesium 250 MG TABS Take 250 mg by mouth daily. 03/31/2016: Takes for migraine prevention  . metFORMIN (GLUCOPHAGE-XR) 500 MG 24 hr tablet TAKE 2 TABLETS BY MOUTH TWO TIMES DAILY   . METRONIDAZOLE, TOPICAL, 0.75 % LOTN Apply 1 application topically 2 (two) times daily.   . Multiple Vitamins-Minerals (MULTIVITAMIN PO) Take 1 tablet by mouth daily. Reported on 03/31/2016   . simvastatin (ZOCOR) 40 MG tablet TAKE 1 TABLET BY MOUTH EVERY EVENING   . tamoxifen (NOLVADEX) 20 MG tablet Take 1 tablet (20 mg total) by mouth daily.   . TRULICITY 1.5 WU/9.8JX SOPN INJECT 1.5 MG INTO THE SKIN EVERY 7 DAYS.   . [DISCONTINUED] lisinopril (ZESTRIL) 10 MG tablet Take 1 tablet (10 mg total) by mouth daily.   . [DISCONTINUED] tamoxifen (NOLVADEX) 20 MG tablet Take 1 tablet (20 mg total) by mouth daily.   . cyclobenzaprine (FLEXERIL) 10 MG tablet Take 1 tablet (10 mg total) by mouth 3 (three) times  daily as needed for muscle spasms. (Patient not taking: Reported on 07/09/2020) 07/09/2020: 1/2 tablet qHS prn (about once/week)  . EPINEPHrine 0.3 mg/0.3 mL IJ SOAJ injection Inject 0.3 mLs (0.3 mg total) into the muscle once as needed for anaphylaxis. Reported on 04/30/2016 (Patient not taking: Reported on 07/09/2020)   . promethazine (PHENERGAN) 25 MG tablet Take 1 tablet (25 mg total) by mouth every 8 (eight) hours as needed for nausea. (Patient not taking: Reported on 07/09/2020)   . [DISCONTINUED] doxycycline (VIBRAMYCIN) 100 MG capsule Take 100 mg by mouth 2 (two) times daily.    No facility-administered encounter medications on file as of 07/09/2020.   Allergies  Allergen Reactions  . Bee Venom Anaphylaxis  . Shellfish Allergy Anaphylaxis  . Invokana [Canagliflozin] Itching  . Penicillins Hives    ROS:  No fever, chills, URI symptoms, cough, shortness of breath, chest pain, dizziness, edema.   No heartburn, bowel changes. +nausea related to Trulicity injections per HPI. No urinary complaints. No bleeding, bruising (just from  injection sites), rash (perioral dermatitis resolved with doxy). No migraines Weight is stable.   PHYSICAL EXAM:  BP 120/82   Pulse 76   Ht 5' 3" (1.6 m)   Wt 257 lb (116.6 kg)   LMP 10/18/1982   BMI 45.53 kg/m   Wt Readings from Last 3 Encounters:  07/09/20 257 lb (116.6 kg)  02/06/20 258 lb (117 kg)  12/20/19 256 lb 12.8 oz (116.5 kg)    Well appearing, pleasant, obese female in no distress HEENT: conjunctiva and sclera are clear, EOMI. Wearing mask Neck: no lymphadenopathy thyromegaly or mass, no bruit Heart: regular rate and rhythm Lungs: clear bilaterally Back: no spinal or CVA tenderness Extremities: no edema Neuro: alert and oriented, normal gait Psych: normal mood, affect, hygiene and grooming  Lab Results  Component Value Date   HGBA1C 7.0 (A) 07/09/2020     ASSESSMENT/PLAN:  Essential hypertension - well controlled - Plan:  lisinopril (ZESTRIL) 10 MG tablet  Thrombocytopenia (HCC) - no significant bleeding/bruising. Due for recheck - Plan: CBC with Differential/Platelet  Atypical ductal hyperplasia of left breast - complete 5 years of medication - Plan: tamoxifen (NOLVADEX) 20 MG tablet  Migraine with typical aura - doing very well, with rare migraines.  Diabetes mellitus with coincident hypertension ([place]) - Increase in A1c--recent stress, poor diet, and not as much exercise.  Will work on these measures now that stresors improved - Plan: HgB A1c, Comprehensive metabolic panel  Elevated LFTs  Medication monitoring encounter - Plan: CBC with Differential/Platelet, Comprehensive metabolic panel  Need for influenza vaccination - Plan: Flu Vaccine QUAD 6+ mos PF IM (Fluarix Quad PF)  Need for viral immunization - discussed boosters, she is high risk and frontline. - Plan: Pfizer SARS-COV-2 Vaccine   Has 2 months left of Trulicity. She is having significant side effects, mainly related to nausea. She would like to finish what she has, and will consider her options when running low. Discussed increasing Jardiance and/or adding in another med (ie change to Janumet/kombiglyze).   c-met and CBC Mildly elevated AST, due for recheck.    F/u 6 mos CPE

## 2020-07-09 ENCOUNTER — Other Ambulatory Visit: Payer: Self-pay

## 2020-07-09 ENCOUNTER — Ambulatory Visit (INDEPENDENT_AMBULATORY_CARE_PROVIDER_SITE_OTHER): Payer: No Typology Code available for payment source | Admitting: Family Medicine

## 2020-07-09 ENCOUNTER — Other Ambulatory Visit: Payer: Self-pay | Admitting: Family Medicine

## 2020-07-09 ENCOUNTER — Encounter: Payer: Self-pay | Admitting: Family Medicine

## 2020-07-09 VITALS — BP 120/82 | HR 76 | Ht 63.0 in | Wt 257.0 lb

## 2020-07-09 DIAGNOSIS — G43109 Migraine with aura, not intractable, without status migrainosus: Secondary | ICD-10-CM | POA: Diagnosis not present

## 2020-07-09 DIAGNOSIS — Z23 Encounter for immunization: Secondary | ICD-10-CM

## 2020-07-09 DIAGNOSIS — I10 Essential (primary) hypertension: Secondary | ICD-10-CM

## 2020-07-09 DIAGNOSIS — N6092 Unspecified benign mammary dysplasia of left breast: Secondary | ICD-10-CM

## 2020-07-09 DIAGNOSIS — E119 Type 2 diabetes mellitus without complications: Secondary | ICD-10-CM

## 2020-07-09 DIAGNOSIS — D696 Thrombocytopenia, unspecified: Secondary | ICD-10-CM

## 2020-07-09 DIAGNOSIS — Z5181 Encounter for therapeutic drug level monitoring: Secondary | ICD-10-CM

## 2020-07-09 DIAGNOSIS — R7989 Other specified abnormal findings of blood chemistry: Secondary | ICD-10-CM

## 2020-07-09 LAB — POCT GLYCOSYLATED HEMOGLOBIN (HGB A1C): Hemoglobin A1C: 7 % — AB (ref 4.0–5.6)

## 2020-07-09 MED ORDER — TAMOXIFEN CITRATE 20 MG PO TABS: 20.0000 mg | ORAL_TABLET | Freq: Every day | ORAL | 0 refills | Status: DC

## 2020-07-09 MED ORDER — LISINOPRIL 10 MG PO TABS: 10.0000 mg | ORAL_TABLET | Freq: Every day | ORAL | 1 refills | Status: DC

## 2020-07-09 MED FILL — TAMOXIFEN 20 MG TABLET: 20 | 30 days supply | Qty: 30 | Fill #0

## 2020-07-09 NOTE — Patient Instructions (Signed)
Continue current medications for now.   If your nausea doesn't improve with less stress and improved diet/exercise, then we should make changes to your diabetes regimen and stop the Trulicity. Touch base with me before needing a refill.

## 2020-07-10 LAB — CBC WITH DIFFERENTIAL/PLATELET
Basophils Absolute: 0 10*3/uL (ref 0.0–0.2)
Basos: 0 %
EOS (ABSOLUTE): 0.2 10*3/uL (ref 0.0–0.4)
Eos: 2 %
Hematocrit: 39.7 % (ref 34.0–46.6)
Hemoglobin: 12.1 g/dL (ref 11.1–15.9)
Immature Grans (Abs): 0 10*3/uL (ref 0.0–0.1)
Immature Granulocytes: 0 %
Lymphocytes Absolute: 2.4 10*3/uL (ref 0.7–3.1)
Lymphs: 35 %
MCH: 27.1 pg (ref 26.6–33.0)
MCHC: 30.5 g/dL — ABNORMAL LOW (ref 31.5–35.7)
MCV: 89 fL (ref 79–97)
Monocytes Absolute: 0.7 10*3/uL (ref 0.1–0.9)
Monocytes: 10 %
Neutrophils Absolute: 3.6 10*3/uL (ref 1.4–7.0)
Neutrophils: 53 %
Platelets: 88 10*3/uL — CL (ref 150–450)
RBC: 4.47 x10E6/uL (ref 3.77–5.28)
RDW: 15.8 % — ABNORMAL HIGH (ref 11.7–15.4)
WBC: 6.9 10*3/uL (ref 3.4–10.8)

## 2020-07-10 LAB — COMPREHENSIVE METABOLIC PANEL
ALT: 23 IU/L (ref 0–32)
AST: 38 IU/L (ref 0–40)
Albumin/Globulin Ratio: 1.3 (ref 1.2–2.2)
Albumin: 3.7 g/dL — ABNORMAL LOW (ref 3.8–4.8)
Alkaline Phosphatase: 129 IU/L — ABNORMAL HIGH (ref 44–121)
BUN/Creatinine Ratio: 12 (ref 12–28)
BUN: 10 mg/dL (ref 8–27)
Bilirubin Total: 0.4 mg/dL (ref 0.0–1.2)
CO2: 22 mmol/L (ref 20–29)
Calcium: 9.4 mg/dL (ref 8.7–10.3)
Chloride: 105 mmol/L (ref 96–106)
Creatinine, Ser: 0.83 mg/dL (ref 0.57–1.00)
GFR calc Af Amer: 87 mL/min/{1.73_m2} (ref >59)
GFR calc non Af Amer: 76 mL/min/{1.73_m2} (ref >59)
Globulin, Total: 2.9 g/dL (ref 1.5–4.5)
Glucose: 106 mg/dL — ABNORMAL HIGH (ref 65–99)
Potassium: 4.9 mmol/L (ref 3.5–5.2)
Sodium: 140 mmol/L (ref 134–144)
Total Protein: 6.6 g/dL (ref 6.0–8.5)

## 2020-07-29 DIAGNOSIS — Z1231 Encounter for screening mammogram for malignant neoplasm of breast: Secondary | ICD-10-CM

## 2020-08-01 ENCOUNTER — Other Ambulatory Visit: Payer: Self-pay | Admitting: Family Medicine

## 2020-08-01 DIAGNOSIS — Z1231 Encounter for screening mammogram for malignant neoplasm of breast: Secondary | ICD-10-CM

## 2020-08-18 ENCOUNTER — Encounter: Payer: Self-pay | Admitting: Family Medicine

## 2020-08-18 MED FILL — TRULICITY 1.5 MG/0.5 ML PEN: 1.5 | 84 days supply | Qty: 6 | Fill #0

## 2020-08-18 MED FILL — LISINOPRIL 10 MG TABS: 10 | 90 days supply | Qty: 90 | Fill #0

## 2020-08-28 DIAGNOSIS — Z1231 Encounter for screening mammogram for malignant neoplasm of breast: Secondary | ICD-10-CM

## 2020-08-29 ENCOUNTER — Other Ambulatory Visit: Payer: Self-pay

## 2020-08-29 ENCOUNTER — Ambulatory Visit
Admission: RE | Admit: 2020-08-29 | Discharge: 2020-08-29 | Disposition: A | Discharge status: Home / Self Care | Payer: No Typology Code available for payment source | Source: Ambulatory Visit

## 2020-08-29 DIAGNOSIS — Z1231 Encounter for screening mammogram for malignant neoplasm of breast: Secondary | ICD-10-CM

## 2020-09-08 ENCOUNTER — Other Ambulatory Visit (HOSPITAL_COMMUNITY): Payer: Self-pay | Admitting: Specialist

## 2020-09-08 MED FILL — DOXYCYCLINE HYCLATE 100 MG: 100 | 30 days supply | Qty: 60 | Fill #0

## 2020-09-17 ENCOUNTER — Other Ambulatory Visit: Payer: Self-pay | Admitting: Family Medicine

## 2020-09-17 DIAGNOSIS — E785 Hyperlipidemia, unspecified: Secondary | ICD-10-CM

## 2020-09-17 DIAGNOSIS — E119 Type 2 diabetes mellitus without complications: Secondary | ICD-10-CM

## 2020-09-17 MED FILL — metFORMIN HCL ER 500 MG TB2: 500 | 90 days supply | Qty: 360 | Fill #0

## 2020-09-17 MED FILL — JARDIANCE 10 MG TABLET: 10 | 90 days supply | Qty: 90 | Fill #0

## 2020-09-17 MED FILL — SIMVASTATIN 40 MG TABLET: 40 | 90 days supply | Qty: 90 | Fill #0

## 2020-10-06 ENCOUNTER — Encounter: Payer: Self-pay | Admitting: Family Medicine

## 2020-10-20 ENCOUNTER — Encounter: Payer: Self-pay | Admitting: Family Medicine

## 2020-10-20 ENCOUNTER — Telehealth: Payer: Self-pay | Admitting: *Deleted

## 2020-10-20 NOTE — Telephone Encounter (Signed)
Patient is okay to see Dr. Emily Filbert for her annual eye exam on 10/30/20 per Dr.Knapp, she needs to call [facility] and let them know.

## 2020-10-21 MED FILL — DOXYCYCLINE HYCLATE 100 MG: 100 | 30 days supply | Qty: 60 | Fill #0

## 2020-10-30 LAB — HM DIABETES EYE EXAM

## 2020-11-05 ENCOUNTER — Encounter: Payer: Self-pay | Admitting: *Deleted

## 2020-11-07 ENCOUNTER — Encounter: Payer: Self-pay | Admitting: Family Medicine

## 2020-11-11 ENCOUNTER — Other Ambulatory Visit: Payer: Self-pay | Admitting: Family Medicine

## 2020-11-11 DIAGNOSIS — E119 Type 2 diabetes mellitus without complications: Secondary | ICD-10-CM

## 2020-11-11 MED FILL — LISINOPRIL 10 MG TABS: 10 | 90 days supply | Qty: 90 | Fill #1

## 2020-11-11 MED FILL — TRULICITY 1.5 MG/0.5 ML PEN: 1.5 | 84 days supply | Qty: 6 | Fill #0

## 2020-12-24 ENCOUNTER — Encounter: Payer: Self-pay | Admitting: Family Medicine

## 2020-12-25 ENCOUNTER — Telehealth: Payer: Self-pay | Admitting: Family Medicine

## 2020-12-25 NOTE — Telephone Encounter (Signed)
Looks like her CPE was scheduled in Oct by Beverlee Nims but I was able to call and get her moved up to 5/4. I called the pt that was scheduled on 6/6 again today and moved her to December. She declined all the other appts due to her work scheduled so I added her to the cancellation list also. The front is aware of the new dates that are available

## 2020-12-25 NOTE — Telephone Encounter (Signed)
Please send me staff message with the name of the person (attach to note) from 6/6. I need to see if she needs any kind of med check.  Delaying 6 months is quite a long time, and I need to look at her meds, etc to see if she really needs to be seen in office sooner or not.  Thanks

## 2020-12-31 ENCOUNTER — Encounter: Payer: No Typology Code available for payment source | Admitting: Family Medicine

## 2021-01-06 ENCOUNTER — Other Ambulatory Visit (HOSPITAL_BASED_OUTPATIENT_CLINIC_OR_DEPARTMENT_OTHER): Payer: Self-pay

## 2021-01-07 ENCOUNTER — Other Ambulatory Visit: Payer: Self-pay | Admitting: Family Medicine

## 2021-01-07 DIAGNOSIS — E119 Type 2 diabetes mellitus without complications: Secondary | ICD-10-CM

## 2021-01-07 DIAGNOSIS — E785 Hyperlipidemia, unspecified: Secondary | ICD-10-CM

## 2021-01-07 MED FILL — METFORMIN HCL ER 500 MG TB2: 500 | 90 days supply | Qty: 360 | Fill #0

## 2021-01-07 MED FILL — SIMVASTATIN 40 MG TABLET: 40 | 90 days supply | Qty: 90 | Fill #0

## 2021-01-07 MED FILL — JARDIANCE 10 MG TABLET: 10 | 90 days supply | Qty: 90 | Fill #0

## 2021-01-07 MED FILL — DOXYCYCLINE HYCLATE 100 MG: 100 | 30 days supply | Qty: 60 | Fill #0

## 2021-02-09 NOTE — Progress Notes (Signed)
65 y.o. G0P0000 Married White or Caucasian Not Hispanic or Latino female here for annual exam.   H/O TAH/BSO at 7 for severe endometriosis. Not sexually active, ED and vaginal pain. Married x 35 years.     H/O DM, Last HgbA1C in 2/22 was 6.4%.   Patient's last menstrual period was 10/18/1982.          Sexually active: No.  The current method of family planning is post menopausal status.    Exercising: No.  The patient does not participate in regular exercise at present. Smoker:  no  Health Maintenance: Pap:  2011  History of abnormal Pap:  no MMG:  08/29/20 Density B Bi-rads 1 neg  BMD:   09/30/2017 OsteopeniaT score of -1.3, FRAX 12.7/0.4% Colonoscopy:02/20/2018 polyp, f/u 10 years  TDaP:  2016  Gardasil: NA   reports that she has never smoked. She has never used smokeless tobacco. She reports that she does not drink alcohol and does not use drugs. She is a Pediatric NP, neurology and complex care. On call 2 weeks a month, working too much. Plans to retire in 12/23.   Past Medical History:  Diagnosis Date  . Anemia   . Asthma   . Atypical ductal hyperplasia of left breast   . Blood transfusion without reported diagnosis 1983  . Diabetes mellitus without complication ([place]) 7026   T2DM  . Dyspareunia   . Exercise-induced asthma    worse in winter  . H/O bone density study 2013  . H/O cold sores   . H/O colonoscopy 2009  . History of endometriosis   . Hyperlipidemia   . Hypertension   . Injury of tendon of rotator cuff 2015   gym injury (right)  . Knee pain 08/2015   L>R  . Migraine with typical aura   . Mild CAD 04/04/2018   Mild disease on coronary CT-A 01/2018.  . Morbid obesity ([place]) 09/04/12   BMI 49.4 kg/m^2   . Osteoarthritis   . Plantar fasciitis, left 2019  . Trigger finger of left thumb 08/2018   injected by Dr. Fredna Dow 09/18/18    Past Surgical History:  Procedure Laterality Date  . BREAST BIOPSY  2010  . BREAST LUMPECTOMY  2010   Benign  . BREAST  LUMPECTOMY WITH RADIOACTIVE SEED LOCALIZATION Left 07/23/2015   Procedure: LEFT BREAST SEED GUIDED EXCISION;  Surgeon: Rolm Bookbinder, MD;  Location: [facility];  Service: General;  Laterality: Left;  . BREATH TEK H PYLORI  08/28/2012   Procedure: BREATH TEK H PYLORI;  Surgeon: Pedro Earls, MD;  Location: [facility];  Service: General;  Laterality: N/A;  . CHOLECYSTECTOMY  2007  . EXPLORATORY LAPAROTOMY  1983   endometriosis  . GANGLION CYST EXCISION Left 1999   Foot  . TOTAL ABDOMINAL HYSTERECTOMY W/ BILATERAL SALPINGOOPHORECTOMY  1984   endometriosis (and appendectomy)    Current Outpatient Medications  Medication Sig Dispense Refill  . aspirin EC 81 MG tablet Take 81 mg by mouth daily.    . Calcium Carbonate-Vit D-Min (CALCIUM 600+D PLUS MINERALS) 600-400 MG-UNIT TABS Take 1 tablet by mouth 2 (two) times daily.    . Cholecalciferol (VITAMIN D3) 125 MCG (5000 UT) CAPS Take 1 capsule by mouth daily.    . cyclobenzaprine (FLEXERIL) 10 MG tablet Take 1 tablet (10 mg total) by mouth 3 (three) times daily as needed for muscle spasms. 30 tablet 0  . doxycycline (VIBRAMYCIN) 100 MG capsule TAKE 1 CAPSULE BY MOUTH TWICE DAILY.  60 capsule 0  . Dulaglutide 1.5 MG/0.5ML SOPN INJECT 1.5 MG (ONE PEN) INTO THE SKIN EVERY 7 DAYS. 6 mL 0  . empagliflozin (JARDIANCE) 10 MG TABS tablet TAKE 1 TABLET BY MOUTH ONCE DAILY WITH BREAKFAST 90 tablet 0  . EPINEPHrine 0.3 mg/0.3 mL IJ SOAJ injection Inject 0.3 mLs (0.3 mg total) into the muscle once as needed for anaphylaxis. Reported on 04/30/2016 1 each 0  . glucose blood test strip 1 each by Other route 2 (two) times daily. Test BID Dx E11.9 100 each 2  . glucose monitoring kit (FREESTYLE) monitoring kit 1 each by Does not apply route as needed for other. 1 each 0  . Lancets (FREESTYLE) lancets 1 each by Other route 2 (two) times daily. Test twice daily Dx:E11.9 100 each 2  . lisinopril (ZESTRIL) 10 MG tablet TAKE 1 TABLET BY MOUTH ONCE  DAILY 90 tablet 1  . Magnesium 250 MG TABS Take 250 mg by mouth daily.    . metFORMIN (GLUCOPHAGE-XR) 500 MG 24 hr tablet TAKE 2 TABLETS BY MOUTH TWO TIMES DAILY 360 tablet 0  . METRONIDAZOLE, TOPICAL, 0.75 % LOTN Apply 1 application topically 2 (two) times daily. 59 mL 12  . Multiple Vitamins-Minerals (MULTIVITAMIN PO) Take 1 tablet by mouth daily. Reported on 03/31/2016    . promethazine (PHENERGAN) 25 MG tablet Take 1 tablet (25 mg total) by mouth every 8 (eight) hours as needed for nausea. 30 tablet 0  . simvastatin (ZOCOR) 40 MG tablet TAKE 1 TABLET BY MOUTH EVERY EVENING 90 tablet 0   No current facility-administered medications for this visit.  Phenergan for migraines.  Family History  Problem Relation Age of Onset  . Diabetes Father   . Heart attack Father 55  . Heart disease Father   . Hypertension Father   . Peripheral Artery Disease Father        stents at 37  . Diabetes Paternal Grandfather   . Heart disease Paternal Grandfather   . Osteoporosis Maternal Grandmother   . Heart failure Maternal Grandmother   . Thyroid disease Mother        hypothyroidism  . Heart disease Mother 76       CABG at 36  . Hypertension Mother   . Diverticulitis Mother 9       perforation, required surgery  . Pneumonia Sister   . Cancer Maternal Grandfather        lung  . Dementia Paternal Grandmother   . Cancer Paternal Grandmother        melanoma on her foot (not related to death)  . Breast cancer Paternal Aunt        48's  . Cancer Maternal Aunt        lung  . Cancer Maternal Uncle        lung  . Breast cancer Cousin        mets to lung  . Congestive Heart Failure Maternal Uncle   . Breast cancer Cousin     Review of Systems  All other systems reviewed and are negative.   Exam:   BP 126/84   Pulse 86   Ht 5' 3"  (1.6 m)   Wt 257 lb (116.6 kg)   LMP 10/18/1982   SpO2 95%   BMI 45.53 kg/m   Weight change: @WEIGHTCHANGE @ Height:   Height: 5' 3"  (160 cm)  Ht Readings from  Last 3 Encounters:  02/11/21 5' 3"  (1.6 m)  07/09/20 5' 3"  (1.6 m)  02/06/20  5' 3"  (1.6 m)    General appearance: alert, cooperative and appears stated age Head: Normocephalic, without obvious abnormality, atraumatic Neck: no adenopathy, supple, symmetrical, trachea midline and thyroid normal to inspection and palpation Lungs: clear to auscultation bilaterally Cardiovascular: regular rate and rhythm Breasts: normal appearance, no masses or tenderness Abdomen: soft, non-tender; non distended,  no masses,  no organomegaly Extremities: extremities normal, atraumatic, no cyanosis or edema Skin: Skin color, texture, turgor normal. Erythematous patchy rash under panus on the left.  Lymph nodes: Cervical, supraclavicular, and axillary nodes normal. No abnormal inguinal nodes palpated Neurologic: Grossly normal   Pelvic: External genitalia:  no lesions, loss of architecture, mild erythema. No lesions, no whitening. Mild fissuring              Urethra:  normal appearing urethra with no masses, tenderness or lesions              Bartholins and Skenes: normal                 Vagina: natrophic appearing vagina with normal color and discharge, no lesions              Cervix: absent               Bimanual Exam:  Uterus:  uterus absent              Adnexa: no mass, fullness, tenderness               Rectovaginal: Confirms               Anus:  normal sphincter tone, no lesions  Gae Dry chaperoned for the exam.  1. Well woman exam Discussed breast self exam Discussed calcium and vit D intake Labs with primary Mammogram, Dexa and colonoscopy are UTD  2. BMI 45.0-49.9, adult [facility]) She has been to the weight loss clinic. She is so busy at work, she doesn't have time to exercise hard to eat healthy Trying to do meal prep and eat healthier.    3. Candidal intertrigo - nystatin cream (MYCOSTATIN); Apply 1 application topically 2 (two) times daily. Apply to affected area BID for up to 7  days.  Dispense: 30 g; Refill: 1

## 2021-02-11 ENCOUNTER — Other Ambulatory Visit: Payer: Self-pay

## 2021-02-11 ENCOUNTER — Encounter: Payer: Self-pay | Admitting: Obstetrics and Gynecology

## 2021-02-11 ENCOUNTER — Ambulatory Visit (INDEPENDENT_AMBULATORY_CARE_PROVIDER_SITE_OTHER): Payer: No Typology Code available for payment source | Admitting: Obstetrics and Gynecology

## 2021-02-11 ENCOUNTER — Other Ambulatory Visit (HOSPITAL_COMMUNITY): Payer: Self-pay

## 2021-02-11 VITALS — BP 126/84 | HR 86 | Ht 63.0 in | Wt 257.0 lb

## 2021-02-11 DIAGNOSIS — Z01419 Encounter for gynecological examination (general) (routine) without abnormal findings: Secondary | ICD-10-CM

## 2021-02-11 DIAGNOSIS — K5900 Constipation, unspecified: Secondary | ICD-10-CM | POA: Insufficient documentation

## 2021-02-11 DIAGNOSIS — Z6841 Body Mass Index (BMI) 40.0 and over, adult: Secondary | ICD-10-CM | POA: Diagnosis not present

## 2021-02-11 DIAGNOSIS — B372 Candidiasis of skin and nail: Secondary | ICD-10-CM | POA: Diagnosis not present

## 2021-02-11 DIAGNOSIS — Z1211 Encounter for screening for malignant neoplasm of colon: Secondary | ICD-10-CM | POA: Insufficient documentation

## 2021-02-11 MED ORDER — NYSTATIN 100000 UNIT/GM EX CREA
1.0000 "application " | TOPICAL_CREAM | Freq: Two times a day (BID) | CUTANEOUS | 1 refills | Status: DC
  Filled 2021-02-11: qty 30, 7d supply, fill #0

## 2021-02-11 NOTE — Patient Instructions (Signed)

## 2021-02-12 ENCOUNTER — Other Ambulatory Visit (HOSPITAL_COMMUNITY): Payer: Self-pay

## 2021-02-17 ENCOUNTER — Other Ambulatory Visit (HOSPITAL_COMMUNITY): Payer: Self-pay | Admitting: *Deleted

## 2021-02-18 ENCOUNTER — Encounter: Payer: No Typology Code available for payment source | Admitting: Family Medicine

## 2021-02-19 ENCOUNTER — Inpatient Hospital Stay (HOSPITAL_BASED_OUTPATIENT_CLINIC_OR_DEPARTMENT_OTHER): Admission: RE | Admit: 2021-02-19 | Payer: No Typology Code available for payment source | Source: Ambulatory Visit

## 2021-02-26 ENCOUNTER — Other Ambulatory Visit (HOSPITAL_BASED_OUTPATIENT_CLINIC_OR_DEPARTMENT_OTHER): Payer: No Typology Code available for payment source

## 2021-02-27 ENCOUNTER — Other Ambulatory Visit: Payer: Self-pay

## 2021-02-27 ENCOUNTER — Ambulatory Visit (HOSPITAL_BASED_OUTPATIENT_CLINIC_OR_DEPARTMENT_OTHER)
Admission: RE | Admit: 2021-02-27 | Discharge: 2021-02-27 | Disposition: A | Discharge status: Home / Self Care | Payer: No Typology Code available for payment source | Source: Ambulatory Visit | Attending: Cardiology | Admitting: Cardiology

## 2021-03-08 NOTE — Patient Instructions (Addendum)
HEALTH MAINTENANCE RECOMMENDATIONS:  It is recommended that you get at least 30 minutes of aerobic exercise at least 5 days/week (for weight loss, you may need as much as 60-90 minutes). This can be any activity that gets your heart rate up. This can be divided in 10-15 minute intervals if needed, but try and build up your endurance at least once a week.  Weight bearing exercise is also recommended twice weekly.  Eat a healthy diet with lots of vegetables, fruits and fiber.  "Colorful" foods have a lot of vitamins (ie green vegetables, tomatoes, red peppers, etc).  Limit sweet tea, regular sodas and alcoholic beverages, all of which has a lot of calories and sugar.  Up to 1 alcoholic drink daily may be beneficial for women (unless trying to lose weight, watch sugars).  Drink a lot of water.  Calcium recommendations are 1200-1500 mg daily (1500 mg for postmenopausal women or women without ovaries), and vitamin D 1000 IU daily.  This should be obtained from diet and/or supplements (vitamins), and calcium should not be taken all at once, but in divided doses.  Monthly self breast exams and yearly mammograms for women over the age of 62 is recommended.  Sunscreen of at least SPF 30 should be used on all sun-exposed parts of the skin when outside between the hours of 10 am and 4 pm (not just when at beach or pool, but even with exercise, golf, tennis, and yard work!)  Use a sunscreen that says "broad spectrum" so it covers both UVA and UVB rays, and make sure to reapply every 1-2 hours.  Remember to change the batteries in your smoke detectors when changing your clock times in the spring and fall. Carbon monoxide detectors are recommended for your home.  Use your seat belt every time you are in a car, and please drive safely and not be distracted with cell phones and texting while driving.  Cindy Carey is the weight loss program that we discussed.  Feel free to ask Cindy Carey more about it if you have  questions, and we can direct you to a coach.  Try Pepcid 20mg  twice daily before breakfast and dinner. If this isn't effective, change to Prilosec OTC (before dinner). Please stop taking Tums.   Calcium Content in Foods Calcium is the most abundant mineral in the body. Most of the body's calcium supply is stored in bones and teeth. Calcium helps many parts of the body function normally, including:  Blood and blood vessels.  Nerves.  Hormones.  Muscles.  Bones and teeth. When your calcium stores are low, you may be at risk for low bone mass, bone loss, and broken bones (fractures). When you get enough calcium, it helps to support strong bones and teeth throughout your life. Calcium is especially important for:  Children during growth spurts.  Girls during adolescence.  Women who are pregnant or breastfeeding.  Women after their menstrual cycle stops (postmenopause).  Women whose menstrual cycle has stopped due to anorexia nervosa or regular intense exercise.  People who cannot eat or digest dairy products.  Vegans. Recommended daily amounts of calcium:  Women (ages 29 to 96): 1,000 mg per day.  Women (ages 90 and older): 1,200 mg per day.  Men (ages 69 to 28): 1,000 mg per day.  Men (ages 97 and older): 1,200 mg per day.  Women (ages 18 to 2): 1,300 mg per day.  Men (ages 32 to 12): 1,300 mg per day. General information  Eat foods  that are high in calcium. Try to get most of your calcium from food.  Some people may benefit from taking calcium supplements. Check with your health care provider or diet and nutrition specialist (dietitian) before starting any calcium supplements. Calcium supplements may interact with certain medicines. Too much calcium may cause other health problems, such as constipation and kidney stones.  For the body to absorb calcium, it needs vitamin D. Sources of vitamin D include: ? Skin exposure to direct sunlight. ? Foods, such as egg  yolks, liver, mushrooms, saltwater fish, and fortified milk. ? Vitamin D supplements. Check with your health care provider or dietitian before starting any vitamin D supplements. What foods are high in calcium? Foods that are high in calcium contain more than 100 milligrams per serving. Fruits  Fortified orange juice or other fruit juice, 300 mg per 8 oz serving. Vegetables  Collard greens, 360 mg per 8 oz serving.  Kale, 100 mg per 8 oz serving.  Bok choy, 160 mg per 8 oz serving. Grains  Fortified ready-to-eat cereals, 100 to 1,000 mg per 8 oz serving.  Fortified frozen waffles, 200 mg in 2 waffles.  Oatmeal, 140 mg in 1 cup. Meats and other proteins  Sardines, canned with bones, 325 mg per 3 oz serving.  Salmon, canned with bones, 180 mg per 3 oz serving.  Canned shrimp, 125 mg per 3 oz serving.  Baked beans, 160 mg per 4 oz serving.  Tofu, firm, made with calcium sulfate, 253 mg per 4 oz serving. Dairy  Yogurt, plain, low-fat, 310 mg per 6 oz serving.  Nonfat milk, 300 mg per 8 oz serving.  American cheese, 195 mg per 1 oz serving.  Cheddar cheese, 205 mg per 1 oz serving.  Cottage cheese 2%, 105 mg per 4 oz serving.  Fortified soy, rice, or almond milk, 300 mg per 8 oz serving.  Mozzarella, part skim, 210 mg per 1 oz serving. The items listed above may not be a complete list of foods high in calcium. Actual amounts of calcium may be different depending on processing. Contact a dietitian for more information.   What foods are lower in calcium? Foods that are lower in calcium contain 50 mg or less per serving. Fruits  Apple, about 6 mg.  Banana, about 12 mg. Vegetables  Lettuce, 19 mg per 2 oz serving.  Tomato, about 11 mg. Grains  Rice, 4 mg per 6 oz serving.  Boiled potatoes, 14 mg per 8 oz serving.  White bread, 6 mg per slice. Meats and other proteins  Egg, 27 mg per 2 oz serving.  Red meat, 7 mg per 4 oz serving.  Chicken, 17 mg per 4  oz serving.  Fish, cod, or trout, 20 mg per 4 oz serving. Dairy  Cream cheese, regular, 14 mg per 1 Tbsp serving.  Brie cheese, 50 mg per 1 oz serving.  Parmesan cheese, 70 mg per 1 Tbsp serving. The items listed above may not be a complete list of foods lower in calcium. Actual amounts of calcium may be different depending on processing. Contact a dietitian for more information. Summary  Calcium is an important mineral in the body because it affects many functions. Getting enough calcium helps support strong bones and teeth throughout your life.  Try to get most of your calcium from food.  Calcium supplements may interact with certain medicines. Check with your health care provider or dietitian before starting any calcium supplements. This information is not intended to  replace advice given to you by your health care provider. Make sure you discuss any questions you have with your health care provider. Document Revised: 01/30/2020 Document Reviewed: 01/30/2020 Elsevier Patient Education  2021 [place] for Gastroesophageal Reflux Disease, Adult When you have gastroesophageal reflux disease (GERD), the foods you eat and your eating habits are very important. Choosing the right foods can help ease the discomfort of GERD. Consider working with a dietitian to help you make healthy food choices. What are tips for following this plan? Reading food labels  Look for foods that are low in saturated fat. Foods that have less than 5% of daily value (DV) of fat and 0 g of trans fats may help with your symptoms. Cooking  Cook foods using methods other than frying. This may include baking, steaming, grilling, or broiling. These are all methods that do not need a lot of fat for cooking.  To add flavor, try to use herbs that are low in spice and acidity. Meal planning  Choose healthy foods that are low in fat, such as fruits, vegetables, whole grains, low-fat dairy products,  lean meats, fish, and poultry.  Eat frequent, small meals instead of three large meals each day. Eat your meals slowly, in a relaxed setting. Avoid bending over or lying down until 2-3 hours after eating.  Limit high-fat foods such as fatty meats or fried foods.  Limit your intake of fatty foods, such as oils, butter, and shortening.  Avoid the following as told by your health care provider: ? Foods that cause symptoms. These may be different for different people. Keep a food diary to keep track of foods that cause symptoms. ? Alcohol. ? Drinking large amounts of liquid with meals. ? Eating meals during the 2-3 hours before bed.   Lifestyle  Maintain a healthy weight. Ask your health care provider what weight is healthy for you. If you need to lose weight, work with your health care provider to do so safely.  Exercise for at least 30 minutes on 5 or more days each week, or as told by your health care provider.  Avoid wearing clothes that fit tightly around your waist and chest.  Do not use any products that contain nicotine or tobacco. These products include cigarettes, chewing tobacco, and vaping devices, such as e-cigarettes. If you need help quitting, ask your health care provider.  Sleep with the head of your bed raised. Use a wedge under the mattress or blocks under the bed frame to raise the head of the bed.  Chew sugar-free gum after mealtimes. What foods should I eat? Eat a healthy, well-balanced diet of fruits, vegetables, whole grains, low-fat dairy products, lean meats, fish, and poultry. Each person is different. Foods that may trigger symptoms in one person may not trigger any symptoms in another person. Work with your health care provider to identify foods that are safe for you. The items listed above may not be a complete list of recommended foods and beverages. Contact a dietitian for more information.   What foods should I avoid? Limiting some of these foods may help  manage the symptoms of GERD. Everyone is different. Consult a dietitian or your health care provider to help you identify the exact foods to avoid, if any. Fruits Any fruits prepared with added fat. Any fruits that cause symptoms. For some people this may include citrus fruits, such as oranges, grapefruit, pineapple, and lemons. Vegetables Deep-fried vegetables. [place] fries. Any vegetables  prepared with added fat. Any vegetables that cause symptoms. For some people, this may include tomatoes and tomato products, chili peppers, onions and garlic, and horseradish. Grains Pastries or quick breads with added fat. Meats and other proteins High-fat meats, such as fatty beef or pork, hot dogs, ribs, ham, sausage, salami, and bacon. Fried meat or protein, including fried fish and fried chicken. Nuts and nut butters, in large amounts. Dairy Whole milk and chocolate milk. Sour cream. Cream. Ice cream. Cream cheese. Milkshakes. Fats and oils Butter. Margarine. Shortening. Ghee. Beverages Coffee and tea, with or without caffeine. Carbonated beverages. Sodas. Energy drinks. Fruit juice made with acidic fruits, such as orange or grapefruit. Tomato juice. Alcoholic drinks. Sweets and desserts Chocolate and cocoa. Donuts. Seasonings and condiments Pepper. Peppermint and spearmint. Added salt. Any condiments, herbs, or seasonings that cause symptoms. For some people, this may include curry, hot sauce, or vinegar-based salad dressings. The items listed above may not be a complete list of foods and beverages to avoid. Contact a dietitian for more information. Questions to ask your health care provider Diet and lifestyle changes are usually the first steps that are taken to manage symptoms of GERD. If diet and lifestyle changes do not improve your symptoms, talk with your health care provider about taking medicines. Where to find more information  International Foundation for Gastrointestinal Disorders:  aboutgerd.org Summary  When you have gastroesophageal reflux disease (GERD), food and lifestyle choices may be very helpful in easing the discomfort of GERD.  Eat frequent, small meals instead of three large meals each day. Eat your meals slowly, in a relaxed setting. Avoid bending over or lying down until 2-3 hours after eating.  Limit high-fat foods such as fatty meats or fried foods. This information is not intended to replace advice given to you by your health care provider. Make sure you discuss any questions you have with your health care provider. Document Revised: 04/14/2020 Document Reviewed: 04/14/2020 Elsevier Patient Education  [place].

## 2021-03-08 NOTE — Progress Notes (Signed)
Chief Complaint  Patient presents with  . Annual Exam    Fasting CPE, no pap-sees Dr.Jertson. Sees Dr. Delman Cheadle for eye exams and is UTD, sam him in Jan. No new concerns.     Cindy Carey is a 65 y.o. female who presents for a complete physical.  She has the following concerns:  Heartburn has gotten worse.  She has been told she has a hiatal hernia. She has been using Tums twice a day.  She has never taken Pepcid or PPI.  She switched from coffee to hot decaff tea, and still has symptoms afterwards.  Denies dysphagia.  Diabetes: Currently on Trulicity (5.7SV/XB),LTJQZESPQ (2031m/d),and Jardiance 130molerating without side effects. She previously stated "I hate the Trulicity"--bruises, is sore at injection sites.  +nausea for 1/2 day for a few days after the shot, sometimes even gagging in the trashcan, even when eating small meals, has been the same since she started the medication. Always occurs in the morning--wonders if is also related to her GERD, which hasn't been well controlled recently. Sugars have been running90's-110 in the mornings, 120's-140's after meals. Denies any changes to her diet. Denies hypoglycemia Last eye exam: 10/2020, no retinopathy Checks feet regularly without concerns. Lab Results  Component Value Date   HGBA1C 7.0 (A) 07/09/2020   Hypertension follow-up: Blood pressures are110-120/70 when checked at work. Denies dizziness, headaches, chest pain. Denies side effects of medications. Shehas leg cramps intermittently at night, related to which shoes she wore all day. She also gets leg cramps with long car rides if they don't stop. This is unchanged.  Migraines--She takes magnesium supplement.  Hasn't had a migraine in 3-4 months. She uses ibuprofen at time of aura, phenergan if that doesn't work.  (Stopped topiramate in the past due to hair thinning. Improved after stopping, but improved much more after completing tamoxifen)  Hyperlipidemia  follow-up: Patient is reportedly following a low-fat, low cholesterol diet. Compliant with medications(simvastatin 4034mnd denies medication side effects. Lipids were at goal on last check, due for recheck. Lab Results  Component Value Date   CHOL 131 12/20/2019   HDL 75 12/20/2019   LDLCALC 38 12/20/2019   TRIG 102 12/20/2019   CHOLHDL 1.7 12/20/2019   Non-obstructive CAD.Calcium score of 1 in 01/2018. She last saw Dr. RanOval Linsey 07/2019 and no changes were made. She had CT calcium score repeated 02/27/21 (part of Doctor's Day):  Coronary calcium score of 36. This was 74 53rcentile for age-, race-, and sex-matched controls. Aortic arch atherosclerosis noted, small focal area. She is compliant with ASA 56m79mily and statin.  Left breast intraductal papilloma with atypical ductal hyperplasia diagnosed 07/15/2015; status post lumpectomy 07/23/2015. Due to lifetime risk of breast cancer of 25%, she was treated with risk lowering therapy with Tamoxifen for 5 years, completing in 08/2020. Shelastsaw Dr. GudeLindi Adiet in5/2019. Last mammogram as 08/2020. Hair has improved significantly.  Obesity:Shewas deemed ineligible for bariatric surgery by her insurance. She has tried multiple weight loss medications, and was not successful in losing weight with [facility] (wasn't as helpful virtually).  At her last visit, had been under a lot of stress (related to work (call), loss of mother-in-law, among other things), and reported that her diet hadn't been great x 3-6 months. Currently she reports still being stressed--taking outpatient call (no longer doing inpatient), 2-3 weeks/month.  Has a meeting scheduled this week to discuss  Mild thrombocytopenia. She denies bleeding or bruising (other than minor bruising at [employer]  injection sites). Due for recheck. Lab Results  Component Value Date   WBC 6.9 07/09/2020   HGB 12.1 07/09/2020   HCT 39.7 07/09/2020   MCV  89 07/09/2020   PLT 88 (LL) 07/09/2020    She uses flexeril before bedtime when muscles are very tight, or if needed for R shoulder pain (1/2 tablet once a week), or else she won't sleep well.  She uses Tylenol before bedtime for other pains (hip, knee).   Immunization History  Administered Date(s) Administered  . Influenza,inj,Quad PF,6+ Mos 07/09/2020  . Influenza-Unspecified 06/27/2015, 07/03/2017, 07/05/2018, 07/19/2019  . PFIZER Comirnaty(Gray Top)Covid-19 Tri-Sucrose Vaccine 03/09/2021  . PFIZER(Purple Top)SARS-COV-2 Vaccination 10/20/2019, 11/10/2019, 07/09/2020  . Pneumococcal Polysaccharide-23 03/31/2016  . Tdap 07/03/2015  . Zoster Recombinat (Shingrix) 12/07/2017, 02/10/2018   Last Pap smear: per GYN, s/p hysterectomy. Last saw Dr. Talbert Nan 01/2021 Last mammogram:08/2020 Last colonoscopy: 02/2018, hyperplastic polyp Dr. Collene Mares Last DEXA:09/2017 T-1.3 at L fem neck Dentist: twice yearly Ophtho: yearly Exercise: walks 30 minutes daily with the dog, but very slowly. No weight-bearing exercise.   Vitamin D-OH 41.4 in 03/2019   PMH, PSH, SH and FH were reviewed and updated  Outpatient Encounter Medications as of 03/09/2021  Medication Sig Note  . aspirin EC 81 MG tablet Take 81 mg by mouth daily.   . calcium carbonate (TUMS - DOSED IN MG ELEMENTAL CALCIUM) 500 MG chewable tablet Chew 1 tablet by mouth 2 (two) times daily. 03/09/2021: Using it about BID fairly regularly recently for heartburn  . Calcium Carbonate-Vit D-Min (CALCIUM 600+D PLUS MINERALS) 600-400 MG-UNIT TABS Take 1 tablet by mouth 2 (two) times daily.   . cyclobenzaprine (FLEXERIL) 10 MG tablet Take 1 tablet (10 mg total) by mouth 3 (three) times daily as needed for muscle spasms. 07/09/2020: 1/2 tablet qHS prn (about once/week)  . Dulaglutide 1.5 MG/0.5ML SOPN INJECT 1.5 MG (ONE PEN) INTO THE SKIN EVERY 7 DAYS.   [name] empagliflozin (JARDIANCE) 10 MG TABS tablet TAKE 1 TABLET BY MOUTH ONCE DAILY WITH BREAKFAST   .  glucose blood test strip 1 each by Other route 2 (two) times daily. Test BID Dx E11.9   . glucose monitoring kit (FREESTYLE) monitoring kit 1 each by Does not apply route as needed for other.   . Lancets (FREESTYLE) lancets 1 each by Other route 2 (two) times daily. Test twice daily Dx:E11.9   . lisinopril (ZESTRIL) 10 MG tablet TAKE 1 TABLET BY MOUTH ONCE DAILY   . Magnesium 250 MG TABS Take 250 mg by mouth daily. 03/31/2016: Takes for migraine prevention  . metFORMIN (GLUCOPHAGE-XR) 500 MG 24 hr tablet TAKE 2 TABLETS BY MOUTH TWO TIMES DAILY   . METRONIDAZOLE, TOPICAL, 0.75 % LOTN Apply 1 application topically 2 (two) times daily.   . Multiple Vitamins-Minerals (MULTIVITAMIN PO) Take 1 tablet by mouth daily. Reported on 03/31/2016   . nystatin cream (MYCOSTATIN) Apply to the affected area 2 times  a day for up to 7 days   . simvastatin (ZOCOR) 40 MG tablet TAKE 1 TABLET BY MOUTH EVERY EVENING   . Cholecalciferol (VITAMIN D3) 125 MCG (5000 UT) CAPS Take 1 capsule by mouth daily. (Patient not taking: Reported on 03/09/2021) 03/09/2021: Ran out a few days ago  . doxycycline (VIBRAMYCIN) 100 MG capsule TAKE 1 CAPSULE BY MOUTH TWICE DAILY. (Patient not taking: Reported on 03/09/2021) 03/09/2021: Uses prn from derm for flares of peri-oral dermatitis  . EPINEPHrine 0.3 mg/0.3 mL IJ SOAJ injection Inject 0.3 mLs (0.3 mg total)  into the muscle once as needed for anaphylaxis. Reported on 04/30/2016 (Patient not taking: Reported on 03/09/2021)   . promethazine (PHENERGAN) 25 MG tablet Take 1 tablet (25 mg total) by mouth every 8 (eight) hours as needed for nausea. (Patient not taking: Reported on 03/09/2021)    No facility-administered encounter medications on file as of 03/09/2021.   Allergies  Allergen Reactions  . Bee Venom Anaphylaxis  . Shellfish Allergy Anaphylaxis  . Invokana [Canagliflozin] Itching  . Penicillins Hives    ROS: The patient denies anorexia, fever, vision changes, decreased hearing, ear  pain, sore throat, breast concerns, chest pain, palpitations, dizziness, syncope, dyspnea on exertion, cough, swelling, vomiting, diarrhea, constipation, abdominal pain, melena, hematochezia, hematuria, incontinence (occ with sneeze and lifting toddlers), dysuria, vaginal bleeding, discharge, odor, genital lesions, numbness, tingling, weakness, tremor, suspicious skin lesions, depression, anxiety, abnormal bleeding/bruising, or enlarged lymph nodes. R>L knee pain, depends on the shoes she wears. Worse with stairs/hills, not a problem when walking dog. Migraines are infrequent. +reflux per HPI, frequent morning nausea    PHYSICAL EXAM:  BP 120/72   Pulse 76   Ht 5' 3"  (1.6 m)   Wt 259 lb 12.8 oz (117.8 kg)   LMP 10/18/1982   BMI 46.02 kg/m   Wt Readings from Last 3 Encounters:  03/09/21 259 lb 12.8 oz (117.8 kg)  02/11/21 257 lb (116.6 kg)  07/09/20 257 lb (116.6 kg)    General Appearance:  Alert, cooperative, no distress, appears stated age. She did not undress into gown for exam.  Head:  Normocephalic, without obvious abnormality, atraumatic  Eyes:  PERRL, conjunctiva/corneas clear, EOM's intact, fundi benign  Ears:  Normal TM's and external ear canals  Nose: Not examined, wearing mask due to COVID-19 pandemic  Throat: Not examined, wearing mask due to COVID-19 pandemic  Neck: Supple, no lymphadenopathy; thyroid: no enlargement/tenderness/ nodules; no carotid bruit or JVD  Back:  Spine nontender, no curvature, ROM normal, no CVAtenderness  Lungs:  Clear to auscultation bilaterally without wheezes, rales orronchi; respirations unlabored  Chest Wall:  No tenderness or deformity  Heart:  Regular rate and rhythm, S1 and S2 normal, no murmur, rub or gallop  Breast Exam:  Deferred to GYN  Abdomen:  Soft, non-tender, nondistended, normoactive bowel sounds, no masses, no hepatosplenomegaly  Genitalia:  Deferred to GYN     Extremities: No clubbing,  cyanosis or edema. Normal diabetic foot exam.   Pulses: 2+ and symmetric all extremities  Skin: Skin color, texture, turgor normal, no rashes or lesions. Exam is limited due to not changing into gown. Hair thinning on anterior scalp is improved from prior visits.    Lymph nodes: Cervical, supraclavicular nodes normal  Neurologic: Normal strength, sensation and gait; reflexes 2+ and symmetric throughout  Psych: Normal mood, affect, hygiene and grooming.  Diabetic foot exam performed, normal.  Lab Results  Component Value Date   HGBA1C 7.8 (A) 03/09/2021    ASSESSMENT/PLAN:  Annual physical exam - Plan: POCT Urinalysis DIP (Proadvantage Device), TSH, Microalbumin / creatinine urine ratio, CBC with Differential/Platelet, Comprehensive metabolic panel, Lipid panel  Need for COVID-19 vaccine - Plan: PFIZER Comirnaty(GRAY TOP)COVID-19 Vaccine  Class 3 severe obesity with serious comorbidity and body mass index (BMI) of 45.0 to 49.9 in adult, unspecified obesity type ([place]) - counseled re: wt loss. To consider [employer]. Encouraged proper diet, portions, exercise.   Essential hypertension - controlled - Plan: lisinopril (ZESTRIL) 10 MG tablet, Comprehensive metabolic panel  Thrombocytopenia (HCC) - no bleeding, due for  recheck - Plan: CBC with Differential/Platelet  Hyperlipidemia, unspecified hyperlipidemia type - Plan: simvastatin (ZOCOR) 40 MG tablet, Lipid panel  Gastroesophageal reflux disease without esophagitis - counseled re: diet, behavioral measures; wt loss rec. Discussed risks/benefits of H2 and PPI, will start OTC  Type 2 diabetes mellitus without complication, without long-term current use of insulin (HCC) - suboptimally controlled; increase Jardiance dose, cont other meds.  f/u 3 mos. Counseled re: diet, exercise, wt loss, risks - Plan: HgB A1c, Dulaglutide 1.5 MG/0.5ML SOPN, empagliflozin (JARDIANCE) 25 MG TABS tablet, TSH, Microalbumin /  creatinine urine ratio, Comprehensive metabolic panel  Medication monitoring encounter - Plan: Microalbumin / creatinine urine ratio, CBC with Differential/Platelet, Comprehensive metabolic panel, Lipid panel  Atherosclerosis of aortic arch (HCC) - on statin; noted on recent CT - Plan: simvastatin (ZOCOR) 40 MG tablet  Diabetes mellitus with coincident hypertension (HCC)  Migraine with typical aura - infrequent. Cont Mg.  Elevated coronary artery calcium score - 74 %ile. Asymptomatic. Cont statin. Discussed calcium intake--likely excessive, to cut back on Tums, total 1200-1534m daily.   A1c, CBC, c-met, lipids, TSH, urine  microalb  Due for dermatology f/u with Dr. GPearline Cables Referral [place] focus [place]  He is treating her for peri-oral dermatitis  Due for 2 year f/u in 10/22 with Dr. ROval Linsey She had CT calcium done recently (free labs for doctor's day), with significant change.  Will send copy of note to Dr. RCaprice Redif she needs to be seen sooner (pt asymptomatic, no sx of CAD), and whether or not ASA should be continued or not.  Discussed monthly self breast exams and yearly mammograms; at least 30 minutes of aerobic activity at least 5 days/week, weight-bearing exercise; proper sunscreen use reviewed; healthy diet, including goals of calcium and vitamin D intake and alcohol recommendations (less than or equal to 1 drink/day) reviewed; regular seatbelt use; changing batteries in smoke detectors. Immunization recommendations discussed--continue yearly flu shots. COVID booster given today. Colonoscopy recommendations reviewed, UTD  F/u 3 mos on DM, A1c at visit.

## 2021-03-09 ENCOUNTER — Telehealth: Payer: Self-pay | Admitting: *Deleted

## 2021-03-09 ENCOUNTER — Other Ambulatory Visit (HOSPITAL_COMMUNITY): Payer: Self-pay

## 2021-03-09 ENCOUNTER — Encounter: Payer: Self-pay | Admitting: Family Medicine

## 2021-03-09 ENCOUNTER — Ambulatory Visit (INDEPENDENT_AMBULATORY_CARE_PROVIDER_SITE_OTHER): Payer: No Typology Code available for payment source | Admitting: Family Medicine

## 2021-03-09 ENCOUNTER — Encounter: Payer: Self-pay | Admitting: *Deleted

## 2021-03-09 ENCOUNTER — Other Ambulatory Visit: Payer: Self-pay

## 2021-03-09 VITALS — BP 120/72 | HR 76 | Ht 63.0 in | Wt 259.8 lb

## 2021-03-09 DIAGNOSIS — K219 Gastro-esophageal reflux disease without esophagitis: Secondary | ICD-10-CM

## 2021-03-09 DIAGNOSIS — E119 Type 2 diabetes mellitus without complications: Secondary | ICD-10-CM | POA: Diagnosis not present

## 2021-03-09 DIAGNOSIS — Z0000 Encounter for general adult medical examination without abnormal findings: Secondary | ICD-10-CM | POA: Diagnosis not present

## 2021-03-09 DIAGNOSIS — E785 Hyperlipidemia, unspecified: Secondary | ICD-10-CM

## 2021-03-09 DIAGNOSIS — I10 Essential (primary) hypertension: Secondary | ICD-10-CM | POA: Diagnosis not present

## 2021-03-09 DIAGNOSIS — Z23 Encounter for immunization: Secondary | ICD-10-CM | POA: Diagnosis not present

## 2021-03-09 DIAGNOSIS — D696 Thrombocytopenia, unspecified: Secondary | ICD-10-CM

## 2021-03-09 DIAGNOSIS — R931 Abnormal findings on diagnostic imaging of heart and coronary circulation: Secondary | ICD-10-CM

## 2021-03-09 DIAGNOSIS — I700 Atherosclerosis of aorta: Secondary | ICD-10-CM | POA: Insufficient documentation

## 2021-03-09 DIAGNOSIS — Z5181 Encounter for therapeutic drug level monitoring: Secondary | ICD-10-CM

## 2021-03-09 DIAGNOSIS — Z6841 Body Mass Index (BMI) 40.0 and over, adult: Secondary | ICD-10-CM

## 2021-03-09 DIAGNOSIS — G43109 Migraine with aura, not intractable, without status migrainosus: Secondary | ICD-10-CM

## 2021-03-09 LAB — POCT URINALYSIS DIP (PROADVANTAGE DEVICE)
Bilirubin, UA: NEGATIVE
Blood, UA: NEGATIVE
Glucose, UA: 500 mg/dL — AB
Ketones, POC UA: NEGATIVE mg/dL
Leukocytes, UA: NEGATIVE
Nitrite, UA: NEGATIVE
Protein Ur, POC: NEGATIVE mg/dL
Specific Gravity, Urine: 1.015
Urobilinogen, Ur: NEGATIVE
pH, UA: 6 (ref 5.0–8.0)

## 2021-03-09 LAB — POCT GLYCOSYLATED HEMOGLOBIN (HGB A1C): Hemoglobin A1C: 7.8 % — AB (ref 4.0–5.6)

## 2021-03-09 MED ORDER — LISINOPRIL 10 MG PO TABS
10.0000 mg | ORAL_TABLET | Freq: Every day | ORAL | 3 refills | Status: DC
  Filled 2021-03-09: qty 90, 90d supply, fill #0
  Filled 2021-06-16: qty 90, 90d supply, fill #1
  Filled 2021-09-13: qty 90, 90d supply, fill #2
  Filled 2021-12-09: qty 90, 90d supply, fill #3

## 2021-03-09 MED ORDER — SIMVASTATIN 40 MG PO TABS
40.0000 mg | ORAL_TABLET | Freq: Every evening | ORAL | 1 refills | Status: DC
  Filled 2021-03-09 – 2021-03-31 (×2): qty 90, 90d supply, fill #0
  Filled 2021-06-16: qty 90, 90d supply, fill #1

## 2021-03-09 MED ORDER — DULAGLUTIDE 1.5 MG/0.5ML ~~LOC~~ SOAJ
1.5000 mg | SUBCUTANEOUS | 1 refills | Status: DC
  Filled 2021-03-09: qty 6, 84d supply, fill #0

## 2021-03-09 MED ORDER — EMPAGLIFLOZIN 25 MG PO TABS
25.0000 mg | ORAL_TABLET | Freq: Every day | ORAL | 1 refills | Status: DC
  Filled 2021-03-09: qty 90, 90d supply, fill #0
  Filled 2021-06-16: qty 90, 90d supply, fill #1

## 2021-03-09 NOTE — Telephone Encounter (Signed)
Okay for referral to Dr. Pearline Cables (derm) on 04/09/21. Dr. Tomi Bamberger approved this referral and patient will contact [facility].

## 2021-03-10 LAB — COMPREHENSIVE METABOLIC PANEL
ALT: 32 IU/L (ref 0–32)
AST: 49 IU/L — ABNORMAL HIGH (ref 0–40)
Albumin/Globulin Ratio: 1.6 (ref 1.2–2.2)
Albumin: 4.2 g/dL (ref 3.8–4.8)
Alkaline Phosphatase: 159 IU/L — ABNORMAL HIGH (ref 44–121)
BUN/Creatinine Ratio: 13 (ref 12–28)
BUN: 10 mg/dL (ref 8–27)
Bilirubin Total: 0.5 mg/dL (ref 0.0–1.2)
CO2: 20 mmol/L (ref 20–29)
Calcium: 9.3 mg/dL (ref 8.7–10.3)
Chloride: 106 mmol/L (ref 96–106)
Creatinine, Ser: 0.75 mg/dL (ref 0.57–1.00)
Globulin, Total: 2.7 g/dL (ref 1.5–4.5)
Glucose: 128 mg/dL — ABNORMAL HIGH (ref 65–99)
Potassium: 4.6 mmol/L (ref 3.5–5.2)
Sodium: 141 mmol/L (ref 134–144)
Total Protein: 6.9 g/dL (ref 6.0–8.5)
eGFR: 89 mL/min/{1.73_m2} (ref >59)

## 2021-03-10 LAB — LIPID PANEL
Chol/HDL Ratio: 1.9 ratio (ref 0.0–4.4)
Cholesterol, Total: 150 mg/dL (ref 100–199)
HDL: 77 mg/dL (ref >39)
LDL Chol Calc (NIH): 55 mg/dL (ref 0–99)
Triglycerides: 97 mg/dL (ref 0–149)
VLDL Cholesterol Cal: 18 mg/dL (ref 5–40)

## 2021-03-10 LAB — CBC WITH DIFFERENTIAL/PLATELET
Basophils Absolute: 0 10*3/uL (ref 0.0–0.2)
Basos: 0 %
EOS (ABSOLUTE): 0.2 10*3/uL (ref 0.0–0.4)
Eos: 2 %
Hematocrit: 37.5 % (ref 34.0–46.6)
Hemoglobin: 11.6 g/dL (ref 11.1–15.9)
Immature Grans (Abs): 0 10*3/uL (ref 0.0–0.1)
Immature Granulocytes: 0 %
Lymphocytes Absolute: 2.3 10*3/uL (ref 0.7–3.1)
Lymphs: 36 %
MCH: 26.2 pg — ABNORMAL LOW (ref 26.6–33.0)
MCHC: 30.9 g/dL — ABNORMAL LOW (ref 31.5–35.7)
MCV: 85 fL (ref 79–97)
Monocytes Absolute: 0.6 10*3/uL (ref 0.1–0.9)
Monocytes: 9 %
Neutrophils Absolute: 3.4 10*3/uL (ref 1.4–7.0)
Neutrophils: 53 %
Platelets: 86 10*3/uL — CL (ref 150–450)
RBC: 4.42 x10E6/uL (ref 3.77–5.28)
RDW: 15.9 % — ABNORMAL HIGH (ref 11.7–15.4)
WBC: 6.5 10*3/uL (ref 3.4–10.8)

## 2021-03-10 LAB — MICROALBUMIN / CREATININE URINE RATIO
Creatinine, Urine: 71.1 mg/dL
Microalb/Creat Ratio: 9 mg/g creat (ref 0–29)
Microalbumin, Urine: 6.2 ug/mL

## 2021-03-10 LAB — TSH: TSH: 1.54 u[IU]/mL (ref 0.450–4.500)

## 2021-03-25 ENCOUNTER — Encounter: Payer: Self-pay | Admitting: Obstetrics and Gynecology

## 2021-03-25 ENCOUNTER — Encounter: Payer: Self-pay | Admitting: Family Medicine

## 2021-03-26 ENCOUNTER — Other Ambulatory Visit: Payer: Self-pay | Admitting: *Deleted

## 2021-03-26 ENCOUNTER — Other Ambulatory Visit (HOSPITAL_COMMUNITY): Payer: Self-pay

## 2021-03-26 MED ORDER — GLUCOSE BLOOD VI STRP
1.0000 | ORAL_STRIP | Freq: Two times a day (BID) | 2 refills | Status: DC
  Filled 2021-03-26: qty 100, 50d supply, fill #0
  Filled 2021-05-16: qty 100, 50d supply, fill #1
  Filled 2021-11-11: qty 100, 50d supply, fill #2

## 2021-03-31 ENCOUNTER — Other Ambulatory Visit: Payer: Self-pay | Admitting: Family Medicine

## 2021-03-31 ENCOUNTER — Other Ambulatory Visit (HOSPITAL_COMMUNITY): Payer: Self-pay

## 2021-03-31 MED ORDER — CYCLOBENZAPRINE HCL 10 MG PO TABS
10.0000 mg | ORAL_TABLET | Freq: Three times a day (TID) | ORAL | 0 refills | Status: DC | PRN
  Filled 2021-03-31: qty 30, 10d supply, fill #0

## 2021-04-03 ENCOUNTER — Other Ambulatory Visit (HOSPITAL_COMMUNITY): Payer: Self-pay

## 2021-04-03 ENCOUNTER — Telehealth: Payer: No Typology Code available for payment source | Admitting: Physician Assistant

## 2021-04-03 DIAGNOSIS — B372 Candidiasis of skin and nail: Secondary | ICD-10-CM

## 2021-04-03 MED ORDER — FLUCONAZOLE 150 MG PO TABS
150.0000 mg | ORAL_TABLET | ORAL | 0 refills | Status: DC
  Filled 2021-04-03: qty 3, 21d supply, fill #0

## 2021-04-03 NOTE — Progress Notes (Signed)
I have spent 5 minutes in review of e-visit questionnaire, review and updating patient chart, medical decision making and response to patient.   Raymonde Hamblin Cody Alicya Bena, PA-C    

## 2021-04-03 NOTE — Progress Notes (Signed)
E-Visit for [employer]  We are sorry that you are not feeling well. Here is how we plan to help!  Based on what you shared with me it looks like you have tinea cruris, or "Jock Itch".  The symptoms of Jock Itch include red, peeling, itchy rash that affects the groin (crease where the leg meets the trunk).  This fungal infection can be spread through shared towels, clothing, bedding, or hard surfaces (particularly in moist areas) such as shower stalls, locker room floors, or pool area that has the fungus present. If you have a fungal infection on one part of your body, you can also spread it to other parts. For instance, men with a fungal infection on their feet sometimes spread it to their groin.  Prescription medications are only indicated for an extensive rash or if over the counter treatments have failed.  I am prescribing:Fluconazole 150 mg once weekly for two to four weeks. I want you to hold your cholesterol medication for a couple days after taking each dose of the diflucan (fluconazole)  HOME CARE:  Keep affected area clean, dry, and cool. Wash with soap and shampoo after sports or exercise and dry yourself well after bathing or swimming Wear cotton underwear and change them if they become damp or sweaty. Avoid using swimming pools, public showers, or baths.  GET HELP RIGHT AWAY IF:  Symptoms that don't away after treatment. Severe itching that persists. If your rash spreads or swells. If your rash begins to have drainage or smell. You develop a fever.  MAKE SURE YOU   Understand these instructions. Will watch your condition. Will get help right away if you are not doing well or get worse.  Thank you for choosing an e-visit.  Your e-visit answers were reviewed by a board certified advanced clinical practitioner to complete your personal care plan. Depending upon the condition, your plan could have included both over the counter or prescription medications.  Please review  your pharmacy choice. Make sure the pharmacy is open so you can pick up prescription now. If there is a problem, you may contact your provider through [employer] and have the prescription routed to another pharmacy.  Your safety is important to [name]. If you have drug allergies check your prescription carefully.   For the next 24 hours you can use MyChart to ask questions about today's visit, request a non-urgent call back, or ask for a work or school excuse.  You will get an email in the next two days asking about your experience. I hope that your e-visit has been valuable and will speed your recovery   References or for more information:  [employer]hu [email or web address]=[email or web address]~52&usage_type=default&display_rank=3

## 2021-04-06 ENCOUNTER — Encounter: Payer: Self-pay | Admitting: Family Medicine

## 2021-04-13 ENCOUNTER — Encounter: Payer: Self-pay | Admitting: Family Medicine

## 2021-04-13 DIAGNOSIS — E119 Type 2 diabetes mellitus without complications: Secondary | ICD-10-CM

## 2021-04-20 ENCOUNTER — Other Ambulatory Visit: Payer: Self-pay | Admitting: Family Medicine

## 2021-04-20 DIAGNOSIS — E119 Type 2 diabetes mellitus without complications: Secondary | ICD-10-CM

## 2021-04-21 ENCOUNTER — Other Ambulatory Visit (HOSPITAL_COMMUNITY): Payer: Self-pay

## 2021-04-21 MED ORDER — METFORMIN HCL ER 500 MG PO TB24
1000.0000 mg | ORAL_TABLET | Freq: Two times a day (BID) | ORAL | 0 refills | Status: DC
  Filled 2021-04-21: qty 360, 90d supply, fill #0

## 2021-04-24 ENCOUNTER — Other Ambulatory Visit (HOSPITAL_COMMUNITY): Payer: Self-pay

## 2021-04-27 ENCOUNTER — Other Ambulatory Visit: Payer: Self-pay | Admitting: Family Medicine

## 2021-04-27 ENCOUNTER — Other Ambulatory Visit (HOSPITAL_COMMUNITY): Payer: Self-pay

## 2021-04-27 MED ORDER — TRULICITY 3 MG/0.5ML ~~LOC~~ SOAJ
3.0000 mg | SUBCUTANEOUS | 1 refills | Status: DC
  Filled 2021-04-27: qty 6, 84d supply, fill #0

## 2021-04-27 MED ORDER — EPINEPHRINE 0.3 MG/0.3ML IJ SOAJ
0.3000 mg | Freq: Once | INTRAMUSCULAR | 0 refills | Status: DC | PRN
  Filled 2021-04-27: qty 2, 14d supply, fill #0

## 2021-04-27 NOTE — Telephone Encounter (Signed)
Is this okay to refill? 

## 2021-04-28 ENCOUNTER — Other Ambulatory Visit: Payer: Self-pay | Admitting: Family Medicine

## 2021-04-28 DIAGNOSIS — Z1231 Encounter for screening mammogram for malignant neoplasm of breast: Secondary | ICD-10-CM

## 2021-05-18 ENCOUNTER — Other Ambulatory Visit (HOSPITAL_COMMUNITY): Payer: Self-pay

## 2021-05-20 ENCOUNTER — Encounter (INDEPENDENT_AMBULATORY_CARE_PROVIDER_SITE_OTHER): Payer: Self-pay | Admitting: Bariatrics

## 2021-05-20 ENCOUNTER — Other Ambulatory Visit: Payer: Self-pay

## 2021-05-20 ENCOUNTER — Ambulatory Visit (INDEPENDENT_AMBULATORY_CARE_PROVIDER_SITE_OTHER): Payer: No Typology Code available for payment source | Admitting: Bariatrics

## 2021-05-20 VITALS — BP 118/80 | HR 68 | Temp 97.9°F | Ht 63.0 in | Wt 251.0 lb

## 2021-05-20 DIAGNOSIS — E559 Vitamin D deficiency, unspecified: Secondary | ICD-10-CM

## 2021-05-20 DIAGNOSIS — E1169 Type 2 diabetes mellitus with other specified complication: Secondary | ICD-10-CM

## 2021-05-20 DIAGNOSIS — I10 Essential (primary) hypertension: Secondary | ICD-10-CM

## 2021-05-20 DIAGNOSIS — Z0289 Encounter for other administrative examinations: Secondary | ICD-10-CM

## 2021-05-20 DIAGNOSIS — R5383 Other fatigue: Secondary | ICD-10-CM | POA: Diagnosis not present

## 2021-05-20 DIAGNOSIS — Z1331 Encounter for screening for depression: Secondary | ICD-10-CM | POA: Diagnosis not present

## 2021-05-20 DIAGNOSIS — E785 Hyperlipidemia, unspecified: Secondary | ICD-10-CM

## 2021-05-20 DIAGNOSIS — E1159 Type 2 diabetes mellitus with other circulatory complications: Secondary | ICD-10-CM

## 2021-05-20 DIAGNOSIS — R0602 Shortness of breath: Secondary | ICD-10-CM | POA: Diagnosis not present

## 2021-05-20 DIAGNOSIS — Z9189 Other specified personal risk factors, not elsewhere classified: Secondary | ICD-10-CM | POA: Diagnosis not present

## 2021-05-20 DIAGNOSIS — Z6841 Body Mass Index (BMI) 40.0 and over, adult: Secondary | ICD-10-CM

## 2021-05-21 LAB — INSULIN, RANDOM: INSULIN: 98.4 u[IU]/mL — ABNORMAL HIGH (ref 2.6–24.9)

## 2021-05-21 LAB — C-PEPTIDE: C-Peptide: 10.3 ng/mL — ABNORMAL HIGH (ref 1.1–4.4)

## 2021-05-25 ENCOUNTER — Encounter (INDEPENDENT_AMBULATORY_CARE_PROVIDER_SITE_OTHER): Payer: Self-pay | Admitting: Bariatrics

## 2021-05-25 NOTE — Progress Notes (Signed)
Chief Complaint:   OBESITY Cindy Carey (MR# [id number]) is a 65 y.o. female who presents for evaluation and treatment of obesity and related comorbidities. Current BMI is Body mass index is 44.46 kg/m. Cindy Carey has been struggling with her weight for many years and has been unsuccessful in either losing weight, maintaining weight loss, or reaching her healthy weight goal.  Cindy Carey is currently in the action stage of change and ready to dedicate time achieving and maintaining a healthier weight. Cindy Carey is interested in becoming our patient and working on intensive lifestyle modifications including (but not limited to) diet and exercise for weight loss.  Cindy Carey likes to [employer].  She considers herself to be a picky eater.  She was here about 2 years ago.  Cindy Carey's habits were reviewed today and are as follows: Her family eats meals together, she thinks her family will eat healthier with her, her desired weight loss is 107 pounds, she has been heavy most of her life, she started gaining weight in 1984, her heaviest weight ever was 280 pounds, she is a picky eater and doesn't like to eat healthier foods, she craves crunchy foods, pineapple, cheese, she snacks frequently in the evenings, she skips breakfast frequently, she has problems with excessive hunger, and she struggles with emotional eating.  Depression Screen Cindy Carey's Food and Mood (modified PHQ-9) score was 9.  Depression screen Cindy Carey 2/9 05/20/2021  Decreased Interest 3  Down, Depressed, Hopeless 1  PHQ - 2 Score 4  Altered sleeping 0  Tired, decreased energy 2  Change in appetite 2  Feeling bad or failure about yourself  0  Trouble concentrating 1  Moving slowly or fidgety/restless 0  Suicidal thoughts 0  PHQ-9 Score 9  Difficult doing work/chores Not difficult at all  Some recent data might be hidden   Subjective:   1. Other fatigue Cindy Carey denies daytime somnolence and denies waking up still tired. Cindy Carey generally gets 7 hours of sleep  per night, and states that she has generally restful sleep. Snoring is not present. Apneic episodes are not present. Epworth Sleepiness Score is 2.  Fatigue occurs with certain activities.  2. SOB (shortness of breath) on exertion Cindy Carey notes increasing shortness of breath with exercising and seems to be worsening over time with weight gain. She notes getting out of breath sooner with activity than she used to. This has gotten worse recently. Cindy Carey denies shortness of breath at rest or orthopnea.  Fatigue occurs with certain activities.   3. Essential hypertension Review: taking medications as instructed, no medication side effects noted, no chest pain on exertion, no dyspnea on exertion, no swelling of ankles.  Taking lisinopril.  Well controlled.  BP Readings from Last 3 Encounters:  05/20/21 118/80  03/09/21 120/72  02/11/21 126/84   4. Type 2 diabetes mellitus with other specified complication, without long-term current use of insulin (HCC) Taking metformin and Trulicity.  A1c 7.8.  Lab Results  Component Value Date   HGBA1C 7.8 (A) 03/09/2021   HGBA1C 7.0 (A) 07/09/2020   HGBA1C 6.5 (A) 12/20/2019   Lab Results  Component Value Date   MICROALBUR 0.3 03/31/2016   LDLCALC 55 03/09/2021   CREATININE 0.75 03/09/2021   Lab Results  Component Value Date   INSULIN 98.4 (H) 05/20/2021   INSULIN 40.3 (H) 03/23/2019   INSULIN 59.1 (H) 07/13/2018   5. Hyperlipidemia associated with type 2 diabetes mellitus ([place]) Cindy Carey has hyperlipidemia and has been trying to improve her cholesterol levels  with intensive lifestyle modification including a low saturated fat diet, exercise and weight loss. She denies any chest pain, claudication or myalgias.  Taking simvastatin.   Lab Results  Component Value Date   ALT 32 03/09/2021   AST 49 (H) 03/09/2021   ALKPHOS 159 (H) 03/09/2021   BILITOT 0.5 03/09/2021   Lab Results  Component Value Date   CHOL 150 03/09/2021   HDL 77 03/09/2021   LDLCALC  55 03/09/2021   TRIG 97 03/09/2021   CHOLHDL 1.9 03/09/2021   6. Vitamin D deficiency She is currently taking OTC vitamin D 5,000 IU each day. She denies nausea, vomiting or muscle weakness.  Lab Results  Component Value Date   VD25OH 41.4 03/23/2019   VD25OH 48.7 12/14/2018   VD25OH 50.0 07/13/2018   7. Depression screen Cindy Carey was screened for depression as part of her new patient workup today.  PHQ-9 is 9.  8. At risk for impaired metabolic function Cindy Carey is at increased risk for impaired metabolic function due to obesity and low metabolism.   Assessment/Plan:   1. Other fatigue Cindy Carey does feel that her weight is causing her energy to be lower than it should be. Fatigue may be related to obesity, depression or many other causes. Labs will be ordered, and in the meanwhile, Cindy Carey will focus on self care including making healthy food choices, increasing physical activity and focusing on stress reduction.  Gradually increase activities.   - EKG 12-Lead  2. SOB (shortness of breath) on exertion Cindy Carey does feel that she gets out of breath more easily that she used to when she exercises. Cindy Carey's shortness of breath appears to be obesity related and exercise induced. She has agreed to work on weight loss and gradually increase exercise to treat her exercise induced shortness of breath. Will continue to monitor closely.  Gradually increase activities.   3. Essential hypertension Cindy Carey is working on healthy weight loss and exercise to improve blood pressure control. We will watch for signs of hypotension as she continues her lifestyle modifications.  Continue medication.  4. Type 2 diabetes mellitus with other specified complication, without long-term current use of insulin (HCC) Cindy Carey is working on healthy weight loss and exercise to improve blood pressure control. We will watch for signs of hypotension as she continues her lifestyle modifications.  Continue medications.  Will discuss Mounjaro.   Check labs today.  - Insulin, random - C-peptide  5. Hyperlipidemia associated with type 2 diabetes mellitus ([place]) Cardiovascular risk and specific lipid/LDL goals reviewed.  We discussed several lifestyle modifications today and Cindy Carey will continue to work on diet, exercise and weight loss efforts. Orders and follow up as documented in patient record. Continue medication.  Counseling Intensive lifestyle modifications are the first line treatment for this issue. Dietary changes: Increase soluble fiber. Decrease simple carbohydrates. Exercise changes: Moderate to vigorous-intensity aerobic activity 150 minutes per week if tolerated. Lipid-lowering medications: see documented in medical record.  6. Vitamin D deficiency Low Vitamin D level contributes to fatigue and are associated with obesity, breast, and colon cancer. She agrees to continue to take prescription OTC vitamin D 5,000 IU daily and will follow-up for routine testing of Vitamin D, at least 2-3 times per year to avoid over-replacement.  7. Depression screen Cindy Carey had a positive depression screening. Depression is commonly associated with obesity and often results in emotional eating behaviors. We will monitor this closely and work on CBT to help improve the non-hunger eating patterns. Referral to Psychology may  be required if no improvement is seen as she continues in our clinic.  8. At risk for impaired metabolic function Cindy Carey was given approximately 15 minutes of impaired  metabolic function prevention counseling today. We discussed intensive lifestyle modifications today with an emphasis on specific nutrition and exercise instructions and strategies.   Repetitive spaced learning was employed today to elicit superior memory formation and behavioral change.   9. Class 3 severe obesity with serious comorbidity and body mass index (BMI) of 40.0 to 44.9 in adult, unspecified obesity type (HCC)  Cindy Carey is currently in the action stage of  change and her goal is to continue with weight loss efforts. I recommend Cindy Carey begin the structured treatment plan as follows:  She has agreed to the Category 1 Plan and keeping a food journal and adhering to recommended goals of 1000 calories and 70-80 grams of protein.  She will work on meal planning.  Discussed labs with her from 03/09/2021 (CMP, lipid panel, CBC, urine MAB, A1c).  Increase protein intake.  Exercise goals:  Walking with the dog everyday for 30 minutes.    Behavioral modification strategies: increasing lean protein intake, decreasing simple carbohydrates, increasing vegetables, increasing water intake, decreasing eating out, no skipping meals, meal planning and cooking strategies, keeping healthy foods in the home, and planning for success.  She was informed of the importance of frequent follow-up visits to maximize her success with intensive lifestyle modifications for her multiple health conditions. She was informed we would discuss her lab results at her next visit unless there is a critical issue that needs to be addressed sooner. Cindy Carey agreed to keep her next visit at the agreed upon time to discuss these results.  Objective:   Blood pressure 118/80, pulse 68, temperature 97.9 F (36.6 C), height '5\' 3"'$  (1.6 m), weight 251 lb (113.9 kg), last menstrual period 10/18/1982, SpO2 96 %. Body mass index is 44.46 kg/m.  EKG: Normal sinus rhythm, rate 72 bpm.  Low voltage in precordial leads. Extensive anterior-lateral infarct old.  Nonspecific T-abnormality.   Indirect Calorimeter completed today shows a VO2 of 149 and a REE of 1038.  Her calculated basal metabolic rate is [id number] thus her basal metabolic rate is worse than expected.  General: Cooperative, alert, well developed, in no acute distress. HEENT: Conjunctivae and lids unremarkable. Cardiovascular: Regular rhythm.  Lungs: Normal work of breathing. Neurologic: No focal deficits.   Lab Results  Component Value Date    CREATININE 0.75 03/09/2021   BUN 10 03/09/2021   NA 141 03/09/2021   K 4.6 03/09/2021   CL 106 03/09/2021   CO2 20 03/09/2021   Lab Results  Component Value Date   ALT 32 03/09/2021   AST 49 (H) 03/09/2021   ALKPHOS 159 (H) 03/09/2021   BILITOT 0.5 03/09/2021   Lab Results  Component Value Date   HGBA1C 7.8 (A) 03/09/2021   HGBA1C 7.0 (A) 07/09/2020   HGBA1C 6.5 (A) 12/20/2019   HGBA1C 6.6 (H) 07/03/2019   HGBA1C 7.0 (H) 03/23/2019   Lab Results  Component Value Date   INSULIN 98.4 (H) 05/20/2021   INSULIN 40.3 (H) 03/23/2019   INSULIN 59.1 (H) 07/13/2018   Lab Results  Component Value Date   TSH 1.540 03/09/2021   Lab Results  Component Value Date   CHOL 150 03/09/2021   HDL 77 03/09/2021   LDLCALC 55 03/09/2021   TRIG 97 03/09/2021   CHOLHDL 1.9 03/09/2021   Lab Results  Component Value Date  WBC 6.5 03/09/2021   HGB 11.6 03/09/2021   HCT 37.5 03/09/2021   MCV 85 03/09/2021   PLT 86 (LL) 03/09/2021   Lab Results  Component Value Date   IRON 84 12/07/2017   Attestation Statements:   Reviewed by clinician on day of visit: allergies, medications, problem list, medical history, surgical history, family history, social history, and previous encounter notes.  I, Water quality scientist, CMA, am acting as Location manager for [employer], DO  I have reviewed the above documentation for accuracy and completeness, and I agree with the above. Jearld Lesch, DO

## 2021-06-03 ENCOUNTER — Ambulatory Visit (INDEPENDENT_AMBULATORY_CARE_PROVIDER_SITE_OTHER): Payer: No Typology Code available for payment source | Admitting: Bariatrics

## 2021-06-03 ENCOUNTER — Other Ambulatory Visit (HOSPITAL_COMMUNITY): Payer: Self-pay

## 2021-06-03 ENCOUNTER — Other Ambulatory Visit: Payer: Self-pay

## 2021-06-03 ENCOUNTER — Encounter (INDEPENDENT_AMBULATORY_CARE_PROVIDER_SITE_OTHER): Payer: Self-pay | Admitting: Bariatrics

## 2021-06-03 VITALS — BP 128/83 | HR 84 | Temp 97.7°F | Ht 63.0 in | Wt 249.0 lb

## 2021-06-03 DIAGNOSIS — E1169 Type 2 diabetes mellitus with other specified complication: Secondary | ICD-10-CM

## 2021-06-03 DIAGNOSIS — I10 Essential (primary) hypertension: Secondary | ICD-10-CM

## 2021-06-03 DIAGNOSIS — Z9189 Other specified personal risk factors, not elsewhere classified: Secondary | ICD-10-CM

## 2021-06-03 MED ORDER — TIRZEPATIDE 2.5 MG/0.5ML ~~LOC~~ SOAJ
2.5000 mg | SUBCUTANEOUS | 0 refills | Status: DC
  Filled 2021-06-03: qty 2, 28d supply, fill #0

## 2021-06-04 NOTE — Progress Notes (Signed)
Chief Complaint:   OBESITY Cindy Carey is here to discuss her progress with her obesity treatment plan along with follow-up of her obesity related diagnoses. Cindy Carey is on the Category 1 Plan and states she is following her eating plan approximately 95% of the time. Cindy Carey states she is walking the dog for 30 minutes 7 times per week.  Today's visit was #: 3 Starting weight: 251 lbs Starting date: 05/20/2021 Today's weight: 249 lbs Today's date: 06/03/2021 Total lbs lost to date: 2 lbs Total lbs lost since last in-office visit: 2 lbs  Interim History: Cindy Carey is down 2 lbs since her 1st visit. She felt "bloated".  Subjective:   1. Type 2 diabetes mellitus with other specified complication, without long-term current use of insulin (HCC) Cindy Carey is currently taking Trulicity, Jardiance and Metformin. Her fasting blood sugar typically were 110-146 (highest 168) Before dinner 70-111.She is taking Cindy Carey 2.5 mg.  2. Essential hypertension Review: taking medications as instructed, no medication side effects noted, no chest pain on exertion, no dyspnea on exertion, no swelling of ankles.  Lacresha's hypertension well controlled.  3.At risk for activity intolerance Yesli is at risk for activity intolerance due to obesity , and weather.   Assessment/Plan:   1. Type 2 diabetes mellitus with other specified complication, without long-term current use of insulin (HCC) We will refill Cindy Carey for 1 month with no refills.Good blood sugar control is important to decrease the likelihood of diabetic complications such as nephropathy, neuropathy, limb loss, blindness, coronary artery disease, and death. Intensive lifestyle modification including diet, exercise and weight loss are the first line of treatment for diabetes.   - tirzepatide [facility]) 2.5 MG/0.5ML Pen; Inject 2.5 mg into the skin once a week.  Dispense: 2 mL; Refill: 0  2. Essential hypertension Cindy Carey will continue medication. She is working on healthy  weight loss and exercise to improve blood pressure control. We will watch for signs of hypotension as she continues her lifestyle modifications.   3. At risk for activity intolerance Cindy Carey is currently in the action stage of change. As such, her goal is to continue with weight loss efforts. She has agreed to the Category 1 Plan.   Cindy Carey will continue meal planning. I reviewed with Cindy Carey labs from 05/20/2021. I discussed with [employer] and dinner options.  Exercise goals:  As is.  Behavioral modification strategies: increasing lean protein intake, decreasing simple carbohydrates, increasing vegetables, increasing water intake, decreasing eating out, no skipping meals, meal planning and cooking strategies, keeping healthy foods in the home, and planning for success.  Cindy Carey has agreed to follow-up with our clinic in 2 weeks. She was informed of the importance of frequent follow-up visits to maximize her success with intensive lifestyle modifications for her multiple health conditions.   Objective:   Blood pressure 128/83, pulse 84, temperature 97.7 F (36.5 C), height '5\' 3"'$  (1.6 m), weight 249 lb (112.9 kg), last menstrual period 10/18/1982, SpO2 93 %. Body mass index is 44.11 kg/m.  General: Cooperative, alert, well developed, in no acute distress. HEENT: Conjunctivae and lids unremarkable. Cardiovascular: Regular rhythm.  Lungs: Normal work of breathing. Neurologic: No focal deficits.   Lab Results  Component Value Date   CREATININE 0.75 03/09/2021   BUN 10 03/09/2021   NA 141 03/09/2021   K 4.6 03/09/2021   CL 106 03/09/2021   CO2 20 03/09/2021   Lab Results  Component Value Date   ALT 32 03/09/2021   AST 49 (H) 03/09/2021  ALKPHOS 159 (H) 03/09/2021   BILITOT 0.5 03/09/2021   Lab Results  Component Value Date   HGBA1C 7.8 (A) 03/09/2021   HGBA1C 7.0 (A) 07/09/2020   HGBA1C 6.5 (A) 12/20/2019   HGBA1C 6.6 (H) 07/03/2019   HGBA1C 7.0 (H) 03/23/2019   Lab Results   Component Value Date   INSULIN 98.4 (H) 05/20/2021   INSULIN 40.3 (H) 03/23/2019   INSULIN 59.1 (H) 07/13/2018   Lab Results  Component Value Date   TSH 1.540 03/09/2021   Lab Results  Component Value Date   CHOL 150 03/09/2021   HDL 77 03/09/2021   LDLCALC 55 03/09/2021   TRIG 97 03/09/2021   CHOLHDL 1.9 03/09/2021   Lab Results  Component Value Date   VD25OH 41.4 03/23/2019   VD25OH 48.7 12/14/2018   VD25OH 50.0 07/13/2018   Lab Results  Component Value Date   WBC 6.5 03/09/2021   HGB 11.6 03/09/2021   HCT 37.5 03/09/2021   MCV 85 03/09/2021   PLT 86 (LL) 03/09/2021   Lab Results  Component Value Date   IRON 84 12/07/2017   Attestation Statements:   Reviewed by clinician on day of visit: allergies, medications, problem list, medical history, surgical history, family history, social history, and previous encounter notes.  I, Lizbeth Bark, RMA, am acting as Location manager for [employer], DO.   I have reviewed the above documentation for accuracy and completeness, and I agree with the above. Jearld Lesch, DO

## 2021-06-07 NOTE — Progress Notes (Signed)
Chief Complaint  Patient presents with   Diabetes    Nonfasting med check. No concerns.     Patient presents for 3 month follow-up.  She got sick over [place] Day weekend into early June. Had ongoing cough--see message Used albuterol nebulizer, which helped. She still has some residual cough, when singing, laughing, talking a lot. Didn't have problems while hiking in the mountains (a little winded with the hills, but not dyspneic or out of the ordinary for the activity, didn't trigger cough).  +throat-clearing, not related to meals. Some hoarseness at the end of the day.  Since her last visit here, she has restarted back at [facility].  They have started her on 2.48m of Mounjaro in place of Trulicity.  She has only had 1 injection so far. So far denies any side effects.  At her physical in May, she was taking Trulicity 14.7ML/YY Metformin (20053md) and Jardiance (1042m).  Her sugars were up (A1c was 7.8), and the dose of Jardiance was increased to 62m67mTrulicity dose was increased to 3mg 56mJune, after pt called and reported persistently elevated sugars. Pt had reported improvement in sugars after this change.   She had 1 high of 168 (8/15)--some stress related to drama with her dad the day before.  Otherwise, sugars in August have been 110-146, mostly 110-130) in the morning, 70-111 before dinner, checked 2x after dinner, 111, 147.  She was tolerating the higher dose of Trulicity without side effects. Had no problems from first injection of Mounjar on Friday. Sugars over this weekend haven't been higher  Denies hypoglycemia Last eye exam: 10/2020, no retinopathy Checks feet regularly without concerns. She continues to walk the dog daily.  GERD:  Heartburn had gotten worse at her physical (she also reports being told she has a hiatal hernia), she had been using Tums twice a day.  We had discussed using Pepcid 20mg 55mor Prilosec OTC (and risks/SE of both).   Denies dysphagia. She currently reports taking Pepcid 20mg a46mdtime.  No longer needing as much Tums. Still has occasional heartburn, depends on what she eats. Uses Tums prn, much less often.   Hypertension follow-up:  Blood pressures are 110-120/70 when checked at work. 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 Denies dizziness, headaches, chest pain.   Denies side effects of medications.     Migraines--She takes magnesium supplement.  Hasn't had a migraine since Christmas. She uses ibuprofen at time of aura, phenergan if that doesn't work.   Hyperlipidemia follow-up:  Patient is reportedly following a low-fat, low cholesterol diet. Compliant with medications (simvastatin 40mg) a6menies medication side effects. Lipids were at goal on last check:  Lab Results  Component Value Date   CHOL 150 03/09/2021   HDL 77 03/09/2021   LDLCALC 55 03/09/2021   TRIG 97 03/09/2021   CHOLHDL 1.9 03/09/2021      PMH, PSH, SH reviewed  Outpatient Encounter Medications as of 06/08/2021  Medication Sig Note   aspirin EC 81 MG tablet Take 81 mg by mouth daily.    Calcium Carbonate-Vit D-Min (CALCIUM 600+D PLUS MINERALS) 600-400 MG-UNIT TABS Take 1 tablet by mouth 2 (two) times daily.    Cholecalciferol (VITAMIN D3) 125 MCG (5000 UT) CAPS Take 1 capsule by mouth daily.    empagliflozin (JARDIANCE) 25 MG TABS tablet Take 1 tablet (25 mg total) by mouth daily with breakfast.  famotidine (PEPCID) 20 MG tablet Take 20 mg by mouth at bedtime.    glucose blood test strip Test  2 (two) times daily.    glucose monitoring kit (FREESTYLE) monitoring kit 1 each by Does not apply route as needed for other.    Lancets (FREESTYLE) lancets 1 each by Other route 2 (two) times daily. Test twice daily Dx:E11.9    lisinopril (ZESTRIL) 10 MG tablet Take 1 tablet (10 mg total) by mouth daily.    Magnesium 250 MG TABS Take 250 mg by mouth daily. 03/31/2016: Takes  for migraine prevention   metFORMIN (GLUCOPHAGE-XR) 500 MG 24 hr tablet Take 2 tablets (1,000 mg total) by mouth 2 (two) times daily.    metroNIDAZOLE (METROGEL) 0.75 % gel APPLY A SMALL AMOUNT TO AFFECTED AREA ON FACE TWICE DAILY    simvastatin (ZOCOR) 40 MG tablet Take 1 tablet (40 mg total) by mouth every evening.    tirzepatide [facility]) 2.5 MG/0.5ML Pen Inject 2.5 mg into the skin once a week.    cyclobenzaprine (FLEXERIL) 10 MG tablet Take 1 tablet (10 mg total) by mouth 3 (three) times daily as needed for muscle spasms. (Patient not taking: Reported on 06/08/2021)    EPINEPHrine 0.3 mg/0.3 mL IJ SOAJ injection Inject 0.3 mg into the muscle once as needed for anaphylaxis. (Patient not taking: Reported on 06/08/2021)    No facility-administered encounter medications on file as of 06/08/2021.   Allergies  Allergen Reactions   Bee Venom Anaphylaxis   Shellfish Allergy Anaphylaxis   Invokana [Canagliflozin] Itching   Penicillins Hives     ROS: Denies fever, chills, URI symptoms, headaches, dizziness, shortness of breath, chest pain.  Denies nausea, vomiting, bowel changes, urinary complaints, bleeding, bruising. She had a skin yeast infection at skin folds (when camping, more perspiring). Resolved, hasn't recurred. Coughing with laughing/singing per HPI.  Denies DOE/SOB.   PHYSICAL EXAM:  BP 138/78   Pulse 72   Ht _0  (1.6 m)   Wt 253 lb (114.8 kg)   LMP 10/18/1982   BMI 44.82 kg/m   Wt Readings from Last 3 Encounters:  06/08/21 253 lb (114.8 kg)  06/03/21 249 lb (112.9 kg)  05/20/21 251 lb (113.9 kg)    Well appearing, pleasant, obese female in no distress HEENT: conjunctiva and sclera are clear, EOMI. Nasal mucosa has mild crusting noted at R nares, no edema of mucosa or mucus present. Sinuses nontender. OP is clear Neck: no lymphadenopathy thyromegaly or mass, no bruit Heart: regular rate and rhythm Lungs: clear bilaterally Back: no spinal or CVA tenderness Abdomen:  soft, nontender, no mass Extremities: no edema Neuro: alert and oriented, normal gait Psych: normal mood, affect, hygiene and grooming  Lab Results  Component Value Date   HGBA1C 6.4 (A) 06/08/2021    ASSESSMENT/PLAN:  Type 2 diabetes mellitus with other specified complication, without long-term current use of insulin (HCC) - HTN, HLD, Aortic atherosclerosis. DM improved control with higher doses of Jardiance and Trulicity, not switching to [employer] - Plan: HgB A1c  Gastroesophageal reflux disease without esophagitis - improved with pepcid qHS. Discussed using prior to lunches that will trigger reflux as well  Class 3 severe obesity with serious comorbidity and body mass index (BMI) of 40.0 to 44.9 in adult, unspecified obesity type ([place]) - comorbidities--DM, HTN, GERD. Improved control. Agree with switch to [facility] for hopefully better %age weight loss   F/u at [facility] in 02/2022. No need to f/u in 3 mos for med check since  will be seeing [facility] regularly. Refills not needed today--will fill when needed in 3 mos to last until physical.

## 2021-06-08 ENCOUNTER — Other Ambulatory Visit (HOSPITAL_COMMUNITY): Payer: Self-pay

## 2021-06-08 ENCOUNTER — Ambulatory Visit (INDEPENDENT_AMBULATORY_CARE_PROVIDER_SITE_OTHER): Payer: No Typology Code available for payment source | Admitting: Family Medicine

## 2021-06-08 ENCOUNTER — Encounter (INDEPENDENT_AMBULATORY_CARE_PROVIDER_SITE_OTHER): Payer: Self-pay | Admitting: Bariatrics

## 2021-06-08 ENCOUNTER — Other Ambulatory Visit: Payer: Self-pay

## 2021-06-08 ENCOUNTER — Encounter: Payer: Self-pay | Admitting: Family Medicine

## 2021-06-08 VITALS — BP 138/78 | HR 72 | Ht 63.0 in | Wt 253.0 lb

## 2021-06-08 DIAGNOSIS — Z6841 Body Mass Index (BMI) 40.0 and over, adult: Secondary | ICD-10-CM | POA: Diagnosis not present

## 2021-06-08 DIAGNOSIS — K219 Gastro-esophageal reflux disease without esophagitis: Secondary | ICD-10-CM | POA: Diagnosis not present

## 2021-06-08 DIAGNOSIS — E1169 Type 2 diabetes mellitus with other specified complication: Secondary | ICD-10-CM

## 2021-06-08 LAB — POCT GLYCOSYLATED HEMOGLOBIN (HGB A1C): Hemoglobin A1C: 6.4 % — AB (ref 4.0–5.6)

## 2021-06-08 MED ORDER — METRONIDAZOLE 0.75 % EX GEL
CUTANEOUS | 2 refills | Status: DC
  Filled 2021-06-08: qty 45, 30d supply, fill #0
  Filled 2021-09-13: qty 45, 30d supply, fill #1

## 2021-06-08 NOTE — Patient Instructions (Signed)
Stay well hydrated. Consider taking antihistamine such as claritin or zyrtec to see if that helps with the throat-clearing, cough and hoarseness. You can also use some mucinex. Pay attention to whether or not reflux is contributing (and then use the Pepcid twice daily regularly).  Continue the famotidine at bedtime. Consider taking it prior to lunch if you know that meal will trigger heartburn (ie spicy, etc).  Your sugars are better--continue the higher dose of jardiance. I'm in support of the change from Trulicity to [facility], and hopefully you will tolerate this and be able to titrate up to the higher dose, for a higher percentage of weight loss.

## 2021-06-12 ENCOUNTER — Other Ambulatory Visit (HOSPITAL_COMMUNITY): Payer: Self-pay

## 2021-06-13 ENCOUNTER — Encounter (INDEPENDENT_AMBULATORY_CARE_PROVIDER_SITE_OTHER): Payer: Self-pay | Admitting: Bariatrics

## 2021-06-15 NOTE — Telephone Encounter (Signed)
Please review

## 2021-06-17 ENCOUNTER — Other Ambulatory Visit (HOSPITAL_COMMUNITY): Payer: Self-pay

## 2021-06-18 ENCOUNTER — Other Ambulatory Visit (HOSPITAL_COMMUNITY): Payer: Self-pay

## 2021-06-18 MED ORDER — DOXYCYCLINE HYCLATE 100 MG PO CAPS
100.0000 mg | ORAL_CAPSULE | Freq: Every day | ORAL | 5 refills | Status: DC
  Filled 2021-06-18: qty 30, 30d supply, fill #0
  Filled 2021-07-21: qty 30, 30d supply, fill #1
  Filled 2021-08-18: qty 30, 30d supply, fill #2
  Filled 2021-09-27: qty 30, 30d supply, fill #3

## 2021-06-30 ENCOUNTER — Other Ambulatory Visit (HOSPITAL_COMMUNITY): Payer: Self-pay

## 2021-06-30 ENCOUNTER — Ambulatory Visit (INDEPENDENT_AMBULATORY_CARE_PROVIDER_SITE_OTHER): Payer: No Typology Code available for payment source | Admitting: Bariatrics

## 2021-06-30 ENCOUNTER — Encounter (INDEPENDENT_AMBULATORY_CARE_PROVIDER_SITE_OTHER): Payer: Self-pay | Admitting: Bariatrics

## 2021-06-30 ENCOUNTER — Other Ambulatory Visit: Payer: Self-pay

## 2021-06-30 VITALS — BP 116/78 | HR 65 | Temp 97.7°F | Ht 63.0 in | Wt 146.0 lb

## 2021-06-30 DIAGNOSIS — Z9189 Other specified personal risk factors, not elsewhere classified: Secondary | ICD-10-CM | POA: Diagnosis not present

## 2021-06-30 DIAGNOSIS — I10 Essential (primary) hypertension: Secondary | ICD-10-CM | POA: Diagnosis not present

## 2021-06-30 DIAGNOSIS — Z6841 Body Mass Index (BMI) 40.0 and over, adult: Secondary | ICD-10-CM | POA: Diagnosis not present

## 2021-06-30 DIAGNOSIS — E1169 Type 2 diabetes mellitus with other specified complication: Secondary | ICD-10-CM | POA: Diagnosis not present

## 2021-06-30 MED ORDER — TIRZEPATIDE 5 MG/0.5ML ~~LOC~~ SOAJ
5.0000 mg | SUBCUTANEOUS | 0 refills | Status: DC
  Filled 2021-06-30: qty 2, 28d supply, fill #0

## 2021-06-30 NOTE — Progress Notes (Signed)
Chief Complaint:   OBESITY Cindy Carey is here to discuss her progress with her obesity treatment plan along with follow-up of her obesity related diagnoses. [name] is on the Category 1 Plan and states she is following her eating plan approximately 75% of the time. [name] states she is walking the dog for 30 minutes 7 times per week.  Today's visit was #: 4 Starting weight: 251 lbs Starting date: 05/20/2021 Today's weight: 246 lbs Today's date: 06/30/2021 Total lbs lost to date: 5 lbs Total lbs lost since last in-office visit: 3 lbs  Interim History: [name] is down another 3 lbs.  Subjective:   1. Type 2 diabetes mellitus with other specified complication, without long-term current use of insulin (HCC) [name] is currently taking Mounjaro, Jardiance and Metformin.  She still has increased appetite. Her FBS was in the 100's.  2. Essential hypertension [name]'s hypertension is controlled.  3. At risk for side effect of medication [name] is at risk for side effect of medication due to increased Mounjaro.  Assessment/Plan:   1. Type 2 diabetes mellitus with other specified complication, without long-term current use of insulin (HCC) Good blood sugar control is important to decrease the likelihood of diabetic complications such as nephropathy, neuropathy, limb loss, blindness, coronary artery disease, and death. We will refill Mounjaro 5 mg once a week for 1 month with no refills. Intensive lifestyle modification including diet, exercise and weight loss are the first line of treatment for diabetes.   - tirzepatide [facility]) 5 MG/0.5ML Pen; Inject 5 mg into the skin once a week.  Dispense: 2 mL; Refill: 0  2. Essential hypertension Deylani will continue her medications. She is working on healthy weight loss and exercise to improve blood pressure control. We will watch for signs of hypotension as she continues her lifestyle modifications.  3. At risk for side effect of medication [name] was given  approximately 15 minutes of drug side effect counseling today.  We discussed side effect possibility and risk versus benefits. [name] agreed to the medication and will contact this office if these side effects are intolerable.  Repetitive spaced learning was employed today to elicit superior memory formation and behavioral change.   4. Obesity, current BMI 43.7 [name] is currently in the action stage of change. As such, her goal is to continue with weight loss efforts. She has agreed to the Category 1 Plan.   [name] will continue meal planning. She will increase protein, H2O and fruits and vegetables.   Exercise goals:  As is.  Behavioral modification strategies: increasing lean protein intake, decreasing simple carbohydrates, increasing vegetables, increasing water intake, decreasing eating out, no skipping meals, meal planning and cooking strategies, keeping healthy foods in the home, and planning for success.  [name] has agreed to follow-up with our clinic in 2-3 weeks. She was informed of the importance of frequent follow-up visits to maximize her success with intensive lifestyle modifications for her multiple health conditions.   Objective:   Blood pressure 116/78, pulse 65, temperature 97.7 F (36.5 C), height '5\' 3"'$  (1.6 m), weight 146 lb (66.2 kg), last menstrual period 10/18/1982, SpO2 (!) 9 %. Body mass index is 25.86 kg/m.  General: Cooperative, alert, well developed, in no acute distress. HEENT: Conjunctivae and lids unremarkable. Cardiovascular: Regular rhythm.  Lungs: Normal work of breathing. Neurologic: No focal deficits.   Lab Results  Component Value Date   CREATININE 0.75 03/09/2021   BUN 10 03/09/2021   NA 141 03/09/2021   K 4.6  03/09/2021   CL 106 03/09/2021   CO2 20 03/09/2021   Lab Results  Component Value Date   ALT 32 03/09/2021   AST 49 (H) 03/09/2021   ALKPHOS 159 (H) 03/09/2021   BILITOT 0.5 03/09/2021   Lab Results  Component Value Date   HGBA1C 6.4  (A) 06/08/2021   HGBA1C 7.8 (A) 03/09/2021   HGBA1C 7.0 (A) 07/09/2020   HGBA1C 6.5 (A) 12/20/2019   HGBA1C 6.6 (H) 07/03/2019   Lab Results  Component Value Date   INSULIN 98.4 (H) 05/20/2021   INSULIN 40.3 (H) 03/23/2019   INSULIN 59.1 (H) 07/13/2018   Lab Results  Component Value Date   TSH 1.540 03/09/2021   Lab Results  Component Value Date   CHOL 150 03/09/2021   HDL 77 03/09/2021   LDLCALC 55 03/09/2021   TRIG 97 03/09/2021   CHOLHDL 1.9 03/09/2021   Lab Results  Component Value Date   VD25OH 41.4 03/23/2019   VD25OH 48.7 12/14/2018   VD25OH 50.0 07/13/2018   Lab Results  Component Value Date   WBC 6.5 03/09/2021   HGB 11.6 03/09/2021   HCT 37.5 03/09/2021   MCV 85 03/09/2021   PLT 86 (LL) 03/09/2021   Lab Results  Component Value Date   IRON 84 12/07/2017   Attestation Statements:   Reviewed by clinician on day of visit: allergies, medications, problem list, medical history, surgical history, family history, social history, and previous encounter notes.  I, Lizbeth Bark, RMA, am acting as Location manager for [employer], DO.   I have reviewed the above documentation for accuracy and completeness, and I agree with the above. Jearld Lesch, DO

## 2021-07-02 ENCOUNTER — Encounter (INDEPENDENT_AMBULATORY_CARE_PROVIDER_SITE_OTHER): Payer: Self-pay | Admitting: Bariatrics

## 2021-07-09 ENCOUNTER — Encounter (INDEPENDENT_AMBULATORY_CARE_PROVIDER_SITE_OTHER): Payer: Self-pay | Admitting: Bariatrics

## 2021-07-09 NOTE — Telephone Encounter (Signed)
Please review

## 2021-07-14 ENCOUNTER — Other Ambulatory Visit (HOSPITAL_COMMUNITY): Payer: Self-pay

## 2021-07-14 ENCOUNTER — Other Ambulatory Visit: Payer: Self-pay | Admitting: Family Medicine

## 2021-07-14 DIAGNOSIS — E119 Type 2 diabetes mellitus without complications: Secondary | ICD-10-CM

## 2021-07-14 MED ORDER — METFORMIN HCL ER 500 MG PO TB24
1000.0000 mg | ORAL_TABLET | Freq: Two times a day (BID) | ORAL | 0 refills | Status: DC
  Filled 2021-07-14: qty 360, 90d supply, fill #0

## 2021-07-20 ENCOUNTER — Encounter: Payer: Self-pay | Admitting: *Deleted

## 2021-07-20 ENCOUNTER — Ambulatory Visit (INDEPENDENT_AMBULATORY_CARE_PROVIDER_SITE_OTHER): Payer: No Typology Code available for payment source | Admitting: Bariatrics

## 2021-07-20 ENCOUNTER — Encounter: Payer: Self-pay | Admitting: Family Medicine

## 2021-07-21 ENCOUNTER — Other Ambulatory Visit: Payer: Self-pay

## 2021-07-21 ENCOUNTER — Other Ambulatory Visit (HOSPITAL_COMMUNITY): Payer: Self-pay

## 2021-07-21 ENCOUNTER — Encounter (INDEPENDENT_AMBULATORY_CARE_PROVIDER_SITE_OTHER): Payer: Self-pay | Admitting: Bariatrics

## 2021-07-21 ENCOUNTER — Ambulatory Visit (INDEPENDENT_AMBULATORY_CARE_PROVIDER_SITE_OTHER): Payer: No Typology Code available for payment source | Admitting: Bariatrics

## 2021-07-21 VITALS — BP 124/84 | HR 73 | Temp 97.7°F | Ht 63.0 in | Wt 245.0 lb

## 2021-07-21 DIAGNOSIS — Z6841 Body Mass Index (BMI) 40.0 and over, adult: Secondary | ICD-10-CM | POA: Diagnosis not present

## 2021-07-21 DIAGNOSIS — E1169 Type 2 diabetes mellitus with other specified complication: Secondary | ICD-10-CM | POA: Diagnosis not present

## 2021-07-21 DIAGNOSIS — E66813 Obesity, class 3: Secondary | ICD-10-CM

## 2021-07-21 DIAGNOSIS — E7849 Other hyperlipidemia: Secondary | ICD-10-CM | POA: Diagnosis not present

## 2021-07-21 MED ORDER — OZEMPIC (0.25 OR 0.5 MG/DOSE) 2 MG/1.5ML ~~LOC~~ SOPN
0.2500 mg | PEN_INJECTOR | SUBCUTANEOUS | 0 refills | Status: DC
  Filled 2021-07-21: qty 1.5, 56d supply, fill #0

## 2021-07-21 NOTE — Progress Notes (Signed)
Chief Complaint:   OBESITY Cindy Carey is here to discuss her progress with her obesity treatment plan along with follow-up of her obesity related diagnoses. Cindy Carey is on the Category 1 Plan and states she is following her eating plan approximately 50% of the time. Cindy Carey states she is walking the dog for 30 minutes 7 times per week.  Today's visit was #: 5 Starting weight: 251 lbs Starting date: 05/20/2021 Today's weight: 245 lbs Today's date: 07/21/2021 Total lbs lost to date: 6 lbs Total lbs lost since last in-office visit: 1 lb  Interim History: Cindy Carey is down 1 lb from her last visit.  Subjective:   1. Type 2 diabetes mellitus with other specified complication, without long-term current use of insulin (HCC) Cindy Carey is currently taking Metformin. Her appetite is normal. She stopped Mounjaro and will not restart.   2. Other hyperlipidemia Cindy Carey is taking Zocor.  Assessment/Plan:   1. Type 2 diabetes mellitus with other specified complication, without long-term current use of insulin ([place]) Cindy Carey will stop Mounjaro. Cindy Carey agrees to start Ozempic 0.25 mg weekly with no refills. Good blood sugar control is important to decrease the likelihood of diabetic complications such as nephropathy, neuropathy, limb loss, blindness, coronary artery disease, and death. Intensive lifestyle modification including diet, exercise and weight loss are the first line of treatment for diabetes.   - Semaglutide,0.25 or 0.5MG /DOS, (OZEMPIC, 0.25 OR 0.5 MG/DOSE,) 2 MG/1.5ML SOPN; Inject 0.25 mg into the skin once a week.  Dispense: 1.5 mL; Refill: 0  2. Other hyperlipidemia Cardiovascular risk and specific lipid/LDL goals reviewed.  We discussed several lifestyle modifications today and Cindy Carey will continue to work on diet, exercise and weight loss efforts. Cindy Carey will continue Zocor. Orders and follow up as documented in patient record.   Counseling Intensive lifestyle modifications are the first line treatment for this  issue. Dietary changes: Increase soluble fiber. Decrease simple carbohydrates. Exercise changes: Moderate to vigorous-intensity aerobic activity 150 minutes per week if tolerated. Lipid-lowering medications: see documented in medical record.  3. Obesity, current BMI 43.5 Cindy Carey is currently in the action stage of change. As such, her goal is to continue with weight loss efforts. She has agreed to the Category 1 Plan.   Cindy Carey will do meal planning. She will continue to follow the plan 80-90%.  Exercise goals:  As is.  Behavioral modification strategies: increasing lean protein intake, decreasing simple carbohydrates, increasing vegetables, increasing water intake, decreasing eating out, no skipping meals, meal planning and cooking strategies, keeping healthy foods in the home, and planning for success.  Cindy Carey has agreed to follow-up with our clinic in 2-3 weeks. She was informed of the importance of frequent follow-up visits to maximize her success with intensive lifestyle modifications for her multiple health conditions.   Objective:   Blood pressure 124/84, pulse 73, temperature 97.7 F (36.5 C), height 5\' 3"  (1.6 m), weight 245 lb (111.1 kg), last menstrual period 10/18/1982, SpO2 97 %. Body mass index is 43.4 kg/m.  General: Cooperative, alert, well developed, in no acute distress. HEENT: Conjunctivae and lids unremarkable. Cardiovascular: Regular rhythm.  Lungs: Normal work of breathing. Neurologic: No focal deficits.   Lab Results  Component Value Date   CREATININE 0.75 03/09/2021   BUN 10 03/09/2021   NA 141 03/09/2021   K 4.6 03/09/2021   CL 106 03/09/2021   CO2 20 03/09/2021   Lab Results  Component Value Date   ALT 32 03/09/2021   AST 49 (H) 03/09/2021  ALKPHOS 159 (H) 03/09/2021   BILITOT 0.5 03/09/2021   Lab Results  Component Value Date   HGBA1C 6.4 (A) 06/08/2021   HGBA1C 7.8 (A) 03/09/2021   HGBA1C 7.0 (A) 07/09/2020   HGBA1C 6.5 (A) 12/20/2019   HGBA1C  6.6 (H) 07/03/2019   Lab Results  Component Value Date   INSULIN 98.4 (H) 05/20/2021   INSULIN 40.3 (H) 03/23/2019   INSULIN 59.1 (H) 07/13/2018   Lab Results  Component Value Date   TSH 1.540 03/09/2021   Lab Results  Component Value Date   CHOL 150 03/09/2021   HDL 77 03/09/2021   LDLCALC 55 03/09/2021   TRIG 97 03/09/2021   CHOLHDL 1.9 03/09/2021   Lab Results  Component Value Date   VD25OH 41.4 03/23/2019   VD25OH 48.7 12/14/2018   VD25OH 50.0 07/13/2018   Lab Results  Component Value Date   WBC 6.5 03/09/2021   HGB 11.6 03/09/2021   HCT 37.5 03/09/2021   MCV 85 03/09/2021   PLT 86 (LL) 03/09/2021   Lab Results  Component Value Date   IRON 84 12/07/2017   Attestation Statements:   Reviewed by clinician on day of visit: allergies, medications, problem list, medical history, surgical history, family history, social history, and previous encounter notes.  I, Cindy Carey, RMA, am acting as Location manager for [employer], DO.   I have reviewed the above documentation for accuracy and completeness, and I agree with the above. Jearld Lesch, DO

## 2021-07-22 ENCOUNTER — Encounter (INDEPENDENT_AMBULATORY_CARE_PROVIDER_SITE_OTHER): Payer: Self-pay | Admitting: Bariatrics

## 2021-07-29 ENCOUNTER — Encounter: Payer: No Typology Code available for payment source | Admitting: Family Medicine

## 2021-08-03 ENCOUNTER — Telehealth: Payer: Self-pay | Admitting: *Deleted

## 2021-08-03 ENCOUNTER — Encounter: Payer: Self-pay | Admitting: Family Medicine

## 2021-08-03 NOTE — Telephone Encounter (Signed)
Okay for patient to have follow up appointment with Dr. Skeet Latch on 08/14/21. Patient will contact [facility].

## 2021-08-05 ENCOUNTER — Ambulatory Visit (INDEPENDENT_AMBULATORY_CARE_PROVIDER_SITE_OTHER): Payer: No Typology Code available for payment source | Admitting: Bariatrics

## 2021-08-14 ENCOUNTER — Encounter (HOSPITAL_BASED_OUTPATIENT_CLINIC_OR_DEPARTMENT_OTHER): Payer: Self-pay | Admitting: Cardiovascular Disease

## 2021-08-14 ENCOUNTER — Ambulatory Visit (HOSPITAL_BASED_OUTPATIENT_CLINIC_OR_DEPARTMENT_OTHER): Payer: No Typology Code available for payment source | Admitting: Cardiovascular Disease

## 2021-08-14 ENCOUNTER — Other Ambulatory Visit: Payer: Self-pay

## 2021-08-14 VITALS — BP 120/70 | HR 69 | Ht 63.0 in | Wt 248.8 lb

## 2021-08-14 DIAGNOSIS — I2510 Atherosclerotic heart disease of native coronary artery without angina pectoris: Secondary | ICD-10-CM

## 2021-08-14 DIAGNOSIS — E785 Hyperlipidemia, unspecified: Secondary | ICD-10-CM | POA: Diagnosis not present

## 2021-08-14 DIAGNOSIS — R0989 Other specified symptoms and signs involving the circulatory and respiratory systems: Secondary | ICD-10-CM

## 2021-08-14 DIAGNOSIS — I10 Essential (primary) hypertension: Secondary | ICD-10-CM

## 2021-08-14 DIAGNOSIS — I739 Peripheral vascular disease, unspecified: Secondary | ICD-10-CM

## 2021-08-14 DIAGNOSIS — I700 Atherosclerosis of aorta: Secondary | ICD-10-CM

## 2021-08-14 NOTE — Progress Notes (Signed)
Cardiology Office Note   Date:  08/14/2021   ID:  Cindy Carey Feb 18, 1956, MRN [id number]  PCP:  Rita Ohara, MD  Cardiologist:   Skeet Latch, MD   No chief complaint on file.     History of Present Illness: Cindy Carey is a 65 y.o. female with non-obstructive CAD, hypertension, hyperlipidemia, diabetes and morbid obesity here for follow up.  She was initially seen 07/2015 for evaluation of an abnormal EKG.  [name] was seen by anesthesia for pre-operative assessment was noted to have an incomplete bundle branch block.  Given that she has a strong family history of heart disease, she requested follow up with cardiology.  At that time she was feeling well and had no additional testing.  She followed up with Jory Sims, DNP, on 12/2017 for pre-operative risk assessment prior to bariatric surgery.  Her mobility was limited due to knee pain.  She was referred for cardiac CT-A 01/18/18 that revealed a small amount of calcified plaque in the LAD but no obstructive CAD.  Her coronary calcium score was 1, which was 64th percentile.   [name] was in the process of considering bariatric surgery. Her blood pressure was elevated. However this was thought to be due to being anxious about her CT scan results.  She was asked to track it at home.  At home her blood pressure has been well have been controlled.  She had been feeling well.  Her insurance did not approve the request for bariatric surgery despite the fact that she had participated in all the weight loss programs that were requested.  Since then the coronavirus started, she lost motivation to pursue it any further.  Aspirin was added for her nonobstructive CAD.  Today, she is feeling good overall with no new complaints. At home her blood pressure is consistently well-controlled around 110/70. For exercise, she walks her dog daily and occasionally feels pain in her calves that improves when she slows down or  stops. She notes that she is not limited by this pain, only by her dog stopping first. This is not a brisk walk, and she sometimes will carry her dog. We reviewed her calcium score results from 02/2021. She denies any palpitations, chest pain, or shortness of breath. No lightheadedness, headaches, syncope, orthopnea, PND, or lower extremity edema.   Past Medical History:  Diagnosis Date   Anemia    Asthma    Atypical ductal hyperplasia of left breast    Back pain    Blood transfusion without reported diagnosis 1983   CAD (coronary artery disease)    Diabetes mellitus without complication ([place]) 7939   T2DM   Dyspareunia    Exercise-induced asthma    worse in winter   Food allergy    H/O bone density study 2013   H/O cold sores    H/O colonoscopy 2009   History of endometriosis    Hyperlipidemia    Hypertension    Injury of tendon of rotator cuff 2015   gym injury (right)   Joint pain    Knee pain 08/2015   L>R   Migraine with typical aura    Mild CAD 04/04/2018   Mild disease on coronary CT-A 01/2018.   Morbid obesity ([place]) 09/04/2012   BMI 49.4 kg/m^2    Osteoarthritis    Plantar fasciitis, left 2019   Trigger finger of left thumb 08/2018   injected by Dr. Fredna Dow 09/18/18    Past Surgical History:  Procedure Laterality Date   BREAST BIOPSY  2010   BREAST LUMPECTOMY  2010   Benign   BREAST LUMPECTOMY WITH RADIOACTIVE SEED LOCALIZATION Left 07/23/2015   Procedure: LEFT BREAST SEED GUIDED EXCISION;  Surgeon: Rolm Bookbinder, MD;  Location: [place];  Service: General;  Laterality: Left;   BREATH TEK H PYLORI  08/28/2012   Procedure: BREATH TEK H PYLORI;  Surgeon: Pedro Earls, MD;  Location: [facility];  Service: General;  Laterality: N/A;   CHOLECYSTECTOMY  2007   EXPLORATORY LAPAROTOMY  1983   endometriosis   GANGLION CYST EXCISION Left 1999   Foot   TOTAL ABDOMINAL HYSTERECTOMY W/ BILATERAL SALPINGOOPHORECTOMY  1984   endometriosis (and  appendectomy)     Current Outpatient Medications  Medication Sig Dispense Refill   aspirin EC 81 MG tablet Take 81 mg by mouth daily.     Calcium Carbonate-Vit D-Min (CALCIUM 600+D PLUS MINERALS) 600-400 MG-UNIT TABS Take 1 tablet by mouth 2 (two) times daily.     Cholecalciferol (VITAMIN D3) 125 MCG (5000 UT) CAPS Take 1 capsule by mouth daily.     cyclobenzaprine (FLEXERIL) 10 MG tablet Take 1 tablet (10 mg total) by mouth 3 (three) times daily as needed for muscle spasms. 30 tablet 0   doxycycline (VIBRAMYCIN) 100 MG capsule Take 1 capsule (100 mg total) by mouth daily. 30 capsule 5   empagliflozin (JARDIANCE) 25 MG TABS tablet Take 1 tablet (25 mg total) by mouth daily with breakfast. 90 tablet 1   EPINEPHrine 0.3 mg/0.3 mL IJ SOAJ injection Inject 0.3 mg into the muscle once as needed for anaphylaxis. 2 each 0   famotidine (PEPCID) 20 MG tablet Take 20 mg by mouth at bedtime.     glucose blood test strip Test  2 (two) times daily. 100 each 2   glucose monitoring kit (FREESTYLE) monitoring kit 1 each by Does not apply route as needed for other. 1 each 0   Lancets (FREESTYLE) lancets 1 each by Other route 2 (two) times daily. Test twice daily Dx:E11.9 100 each 2   lisinopril (ZESTRIL) 10 MG tablet Take 1 tablet (10 mg total) by mouth daily. 90 tablet 3   Magnesium 250 MG TABS Take 250 mg by mouth daily.     metFORMIN (GLUCOPHAGE-XR) 500 MG 24 hr tablet Take 2 tablets (1,000 mg total) by mouth 2 (two) times daily. 360 tablet 0   metroNIDAZOLE (METROGEL) 0.75 % gel APPLY A SMALL AMOUNT TO AFFECTED AREA ON FACE TWICE DAILY 45 g 2   Semaglutide,0.25 or 0.5MG/DOS, (OZEMPIC, 0.25 OR 0.5 MG/DOSE,) 2 MG/1.5ML SOPN Inject 0.25 mg into the skin once a week. 1.5 mL 0   simvastatin (ZOCOR) 40 MG tablet Take 1 tablet (40 mg total) by mouth every evening. 90 tablet 1   No current facility-administered medications for this visit.    Allergies:   Bee venom, Shellfish allergy, Invokana [canagliflozin],  and Penicillins    Social History:  The patient  reports that she has never smoked. She has never used smokeless tobacco. She reports that she does not drink alcohol and does not use drugs.   Family History:  The patient's family history includes Breast cancer in her cousin, cousin, and paternal aunt; Cancer in her maternal aunt, maternal grandfather, maternal uncle, and paternal grandmother; Congestive Heart Failure in her maternal uncle; Dementia in her paternal grandmother; Diabetes in her father and paternal grandfather; Diverticulitis (age of onset: 47) in her mother; Heart attack (age of  onset: 76) in her father; Heart disease in her father and paternal grandfather; Heart disease (age of onset: 59) in her mother; Heart failure in her maternal grandmother; Hypertension in her father and mother; Osteoporosis in her maternal grandmother; Peripheral Artery Disease in her father; Pneumonia in her sister; Thyroid disease in her mother.    ROS:   Please see the history of present illness. (+) Bilateral LE pain, calves All other systems are reviewed and negative.    PHYSICAL EXAM: VS:  BP 120/70 (BP Location: Right Arm, Patient Position: Sitting, Cuff Size: Large)   Pulse 69   Ht 5' 3"  (1.6 m)   Wt 248 lb 12.8 oz (112.9 kg)   LMP 10/18/1982   SpO2 98%   BMI 44.07 kg/m  , BMI Body mass index is 44.07 kg/m. GENERAL:  Well appearing HEENT: Pupils equal round and reactive, fundi not visualized, oral mucosa unremarkable NECK:  No jugular venous distention, waveform within normal limits, carotid upstroke brisk and symmetric, no bruits LUNGS:  Clear to auscultation bilaterally HEART:  RRR.  PMI not displaced or sustained,S1 and S2 within normal limits, no S3, no S4, no clicks, no rubs, no murmurs ABD:  Flat, positive bowel sounds normal in frequency in pitch, no bruits, no rebound, no guarding, no midline pulsatile mass, no hepatomegaly, no splenomegaly EXT:  2 plus pulses throughout, no edema,  no cyanosis no clubbing SKIN:  No rashes no nodules NEURO:  Cranial nerves II through XII grossly intact, motor grossly intact throughout PSYCH:  Cognitively intact, oriented to person place and time   EKG:   08/14/2021: EKG was not ordered today. 08/14/2019: Sinus rhythm.  Rate 80 bpm.  LAFB. 07/18/15: sinus rhythm at 70 bpm.  Left anterior fascicular block. Incomplete right bundle branch block. Low voltage in the precordial leads.  Calcium Score 02/27/2021: FINDINGS: Coronary arteries: Normal origins.   Coronary Calcium Score:   Left main: 0   Left anterior descending artery: 28   Left circumflex artery: 7   Right coronary artery: 1   Total: 36   Percentile: 74   Pericardium: Normal.   Ascending Aorta: Normal caliber. Focal aortic arch small atherosclerosis.   Non-cardiac: See separate report from [facility].   IMPRESSION: Coronary calcium score of 36. This was 72 percentile for age-, race-, and sex-matched controls.   Aortic arch atherosclerosis, small focal area.  Cardiac CT-A 01/18/18: IMPRESSION: 1. Coronary artery calcium score 1 Agatston unit, Placing the patient in the 64th percentile for age and gender. This suggests intermediate risk for future cardiac events. 2.  No obstructive coronary disease.    Recent Labs: 03/09/2021: ALT 32; BUN 10; Creatinine, Ser 0.75; Hemoglobin 11.6; Platelets 86; Potassium 4.6; Sodium 141; TSH 1.540    Lipid Panel    Component Value Date/Time   CHOL 150 03/09/2021 1119   TRIG 97 03/09/2021 1119   HDL 77 03/09/2021 1119   CHOLHDL 1.9 03/09/2021 1119   CHOLHDL 2.3 09/30/2016 0811   VLDL 26 09/30/2016 0811   LDLCALC 55 03/09/2021 1119      Wt Readings from Last 3 Encounters:  08/14/21 248 lb 12.8 oz (112.9 kg)  07/21/21 245 lb (111.1 kg)  06/30/21 146 lb (66.2 kg)      ASSESSMENT AND PLAN:  Hypertension Blood pressure is well-controlled on lisinopril.  Hyperlipidemia Lipids are well-controlled on  simvastatin.  LDL was 55 on 02/2021.  Mild CAD Coronary calcium noted on calcium score.  She was 74th percentile.  Total score  was 36.  She is on appropriate primary prevention with lipids well-controlled.  She walks regularly but is not exercising briskly.  We did discuss the importance of increasing her exercise intensity to make sure that she does not have any anginal symptoms.  Continue aspirin.   Current medicines are reviewed at length with the patient today.  The patient does not have concerns regarding medicines.  The following changes have been made: None  Labs/ tests ordered today include:   Orders Placed This Encounter  Procedures   VAS [name] LOWER EXTREMITY ARTERIAL DUPLEX   VAS [name] ABI WITH/WO TBI    Disposition:   FU with Malarie Tappen C. Oval Linsey, MD in 1 years.   I,Mathew Stumpf,acting as a Education administrator for Skeet Latch, MD.,have documented all relevant documentation on the behalf of Skeet Latch, MD,as directed by  Skeet Latch, MD while in the presence of Skeet Latch, MD.  I, [place] Oval Linsey, MD have reviewed all documentation for this visit.  The documentation of the exam, diagnosis, procedures, and orders on 08/14/2021 are all accurate and complete.   Signed, Skeet Latch, MD  08/14/2021 1:19 PM    [facility]

## 2021-08-14 NOTE — Patient Instructions (Signed)
Medication Instructions:  Your physician recommends that you continue on your current medications as directed. Please refer to the Current Medication list given to you today.   *If you need a refill on your cardiac medications before your next appointment, please call your pharmacy*  Lab Work: NONE   Testing/Procedures: Your physician has requested that you have a lower or upper extremity arterial duplex. This test is an ultrasound of the arteries in the legs or arms. It looks at arterial blood flow in the legs and arms. Allow one hour for Lower and Upper Arterial scans. There are no restrictions or special instructions  Your physician has requested that you have an ankle brachial index (ABI). During this test an ultrasound and blood pressure cuff are used to evaluate the arteries that supply the arms and legs with blood. Allow thirty minutes for this exam. There are no restrictions or special instructions.  Follow-Up: At [facility], you and your health needs are our priority.  As part of our continuing mission to provide you with exceptional heart care, we have created designated Provider Care Teams.  These Care Teams include your primary Cardiologist (physician) and Advanced Practice Providers (APPs -  Physician Assistants and Nurse Practitioners) who all work together to provide you with the care you need, when you need it.  We recommend signing up for the patient portal called "MyChart".  Sign up information is provided on this After Visit Summary.  MyChart is used to connect with patients for Virtual Visits (Telemedicine).  Patients are able to view lab/test results, encounter notes, upcoming appointments, etc.  Non-urgent messages can be sent to your provider as well.   To learn more about what you can do with MyChart, go to [email or web address].    Your next appointment:   12 month(s)  The format for your next appointment:   In Person  Provider:   Skeet Latch, MD

## 2021-08-14 NOTE — Assessment & Plan Note (Signed)
Lipids are well-controlled on simvastatin.  LDL was 55 on 02/2021.

## 2021-08-14 NOTE — Assessment & Plan Note (Signed)
Coronary calcium noted on calcium score.  She was 74th percentile.  Total score was 36.  She is on appropriate primary prevention with lipids well-controlled.  She walks regularly but is not exercising briskly.  We did discuss the importance of increasing her exercise intensity to make sure that she does not have any anginal symptoms.  Continue aspirin.

## 2021-08-14 NOTE — Assessment & Plan Note (Signed)
Blood pressure is well controlled on lisinopril. 

## 2021-08-17 ENCOUNTER — Other Ambulatory Visit (HOSPITAL_COMMUNITY): Payer: Self-pay

## 2021-08-17 ENCOUNTER — Ambulatory Visit (INDEPENDENT_AMBULATORY_CARE_PROVIDER_SITE_OTHER): Payer: No Typology Code available for payment source | Admitting: Bariatrics

## 2021-08-17 ENCOUNTER — Other Ambulatory Visit: Payer: Self-pay

## 2021-08-17 ENCOUNTER — Encounter (INDEPENDENT_AMBULATORY_CARE_PROVIDER_SITE_OTHER): Payer: Self-pay | Admitting: Bariatrics

## 2021-08-17 VITALS — BP 144/80 | HR 65 | Temp 98.1°F | Ht 63.0 in | Wt 245.0 lb

## 2021-08-17 DIAGNOSIS — K219 Gastro-esophageal reflux disease without esophagitis: Secondary | ICD-10-CM | POA: Diagnosis not present

## 2021-08-17 DIAGNOSIS — Z6841 Body Mass Index (BMI) 40.0 and over, adult: Secondary | ICD-10-CM | POA: Diagnosis not present

## 2021-08-17 DIAGNOSIS — E1169 Type 2 diabetes mellitus with other specified complication: Secondary | ICD-10-CM | POA: Diagnosis not present

## 2021-08-17 MED ORDER — ONDANSETRON 4 MG PO TBDP
4.0000 mg | ORAL_TABLET | Freq: Three times a day (TID) | ORAL | 0 refills | Status: DC | PRN
  Filled 2021-08-17: qty 20, 7d supply, fill #0

## 2021-08-17 MED ORDER — OZEMPIC (0.25 OR 0.5 MG/DOSE) 2 MG/1.5ML ~~LOC~~ SOPN
0.5000 mg | PEN_INJECTOR | SUBCUTANEOUS | 0 refills | Status: DC
  Filled 2021-08-17 – 2021-08-19 (×3): qty 1.5, 28d supply, fill #0

## 2021-08-18 ENCOUNTER — Other Ambulatory Visit (HOSPITAL_COMMUNITY): Payer: Self-pay

## 2021-08-18 NOTE — Progress Notes (Signed)
Chief Complaint:   OBESITY Cindy Carey is here to discuss her progress with her obesity treatment plan along with follow-up of her obesity related diagnoses. Cindy Carey is on the Category 1 Plan and states she is following her eating plan approximately 50% of the time. Cindy Carey states she is walking for 30 minutes 7 times per week.  Today's visit was #: 6 Starting weight: 251 lbs Starting date: 05/20/2021 Today's weight: 245 lbs Today's date: 08/17/2021 Total lbs lost to date: 6 lbs Total lbs lost since last in-office visit: 0  Interim History: Cindy Carey's weight remains the same. She is getting adequate water.   Subjective:   1. Type 2 diabetes mellitus with other specified complication, without long-term current use of insulin (HCC) Cindy Carey is taking Metformin, Jardiance and Ozempic. She denies side effects.   2. Gastroesophageal reflux disease without esophagitis Cindy Carey is currently taking Pepcid.   Assessment/Plan:   1. Type 2 diabetes mellitus with other specified complication, without long-term current use of insulin (HCC) We will refill Ozempic 0.5 mg with no refills. We will refill Zofran ODT 4 mg with no refills. Good blood sugar control is important to decrease the likelihood of diabetic complications such as nephropathy, neuropathy, limb loss, blindness, coronary artery disease, and death. Intensive lifestyle modification including diet, exercise and weight loss are the first line of treatment for diabetes.   - Semaglutide,0.25 or 0.5MG /DOS, (OZEMPIC, 0.25 OR 0.5 MG/DOSE,) 2 MG/1.5ML SOPN; Inject 0.5 mg into the skin once a week.  Dispense: 1.5 mL; Refill: 0 - ondansetron (ZOFRAN ODT) 4 MG disintegrating tablet; Take 1 tablet (4 mg total) by mouth every 8 (eight) hours as needed for nausea or vomiting.  Dispense: 20 tablet; Refill: 0  2. Gastroesophageal reflux disease without esophagitis Intensive lifestyle modifications are the first line treatment for this issue. We discussed several  lifestyle modifications today and she will continue to work on diet, exercise and weight loss efforts. Cindy Carey will continue her medications. Orders and follow up as documented in patient record.   Counseling If a person has gastroesophageal reflux disease (GERD), food and stomach acid move back up into the esophagus and cause symptoms or problems such as damage to the esophagus. Anti-reflux measures include: raising the head of the bed, avoiding tight clothing or belts, avoiding eating late at night, not lying down shortly after mealtime, and achieving weight loss. Avoid ASA, NSAID's, caffeine, alcohol, and tobacco.  OTC Pepcid and/or Tums are often very helpful for as needed use.  However, for persisting chronic or daily symptoms, stronger medications like Omeprazole may be needed. You may need to avoid foods and drinks such as: Coffee and tea (with or without caffeine). Drinks that contain alcohol. Energy drinks and sports drinks. Bubbly (carbonated) drinks or sodas. Chocolate and cocoa. Peppermint and mint flavorings. Garlic and onions. Horseradish. Spicy and acidic foods. These include peppers, chili powder, curry powder, vinegar, hot sauces, and BBQ sauce. Citrus fruit juices and citrus fruits, such as oranges, lemons, and limes. Tomato-based foods. These include red sauce, chili, salsa, and pizza with red sauce. Fried and fatty foods. These include donuts, french fries, potato chips, and high-fat dressings. High-fat meats. These include hot dogs, rib eye steak, sausage, ham, and bacon.   3. Class 3 severe obesity with serious comorbidity and body mass index (BMI) of 40.0 to 44.9 in adult, unspecified obesity type (HCC) Cindy Carey is currently in the action stage of change. As such, her goal is to continue with weight loss efforts.  She has agreed to the Category 1 Plan and keeping a food journal and adhering to recommended goals of 1000 calories and 70-80 grams of protein.   Cindy Carey will continue  meal planning and intentional eating.   Exercise goals:  As is.  Behavioral modification strategies: increasing lean protein intake, decreasing simple carbohydrates, increasing vegetables, increasing water intake, decreasing eating out, no skipping meals, meal planning and cooking strategies, keeping healthy foods in the home, and planning for success.  Cindy Carey has agreed to follow-up with our clinic in 3 weeks. She was informed of the importance of frequent follow-up visits to maximize her success with intensive lifestyle modifications for her multiple health conditions.   Objective:   Blood pressure (!) 144/80, pulse 65, temperature 98.1 F (36.7 C), height 5\' 3"  (1.6 m), weight 245 lb (111.1 kg), last menstrual period 10/18/1982, SpO2 98 %. Body mass index is 43.4 kg/m.  General: Cooperative, alert, well developed, in no acute distress. HEENT: Conjunctivae and lids unremarkable. Cardiovascular: Regular rhythm.  Lungs: Normal work of breathing. Neurologic: No focal deficits.   Lab Results  Component Value Date   CREATININE 0.75 03/09/2021   BUN 10 03/09/2021   NA 141 03/09/2021   K 4.6 03/09/2021   CL 106 03/09/2021   CO2 20 03/09/2021   Lab Results  Component Value Date   ALT 32 03/09/2021   AST 49 (H) 03/09/2021   ALKPHOS 159 (H) 03/09/2021   BILITOT 0.5 03/09/2021   Lab Results  Component Value Date   HGBA1C 6.4 (A) 06/08/2021   HGBA1C 7.8 (A) 03/09/2021   HGBA1C 7.0 (A) 07/09/2020   HGBA1C 6.5 (A) 12/20/2019   HGBA1C 6.6 (H) 07/03/2019   Lab Results  Component Value Date   INSULIN 98.4 (H) 05/20/2021   INSULIN 40.3 (H) 03/23/2019   INSULIN 59.1 (H) 07/13/2018   Lab Results  Component Value Date   TSH 1.540 03/09/2021   Lab Results  Component Value Date   CHOL 150 03/09/2021   HDL 77 03/09/2021   LDLCALC 55 03/09/2021   TRIG 97 03/09/2021   CHOLHDL 1.9 03/09/2021   Lab Results  Component Value Date   VD25OH 41.4 03/23/2019   VD25OH 48.7 12/14/2018    VD25OH 50.0 07/13/2018   Lab Results  Component Value Date   WBC 6.5 03/09/2021   HGB 11.6 03/09/2021   HCT 37.5 03/09/2021   MCV 85 03/09/2021   PLT 86 (LL) 03/09/2021   Lab Results  Component Value Date   IRON 84 12/07/2017   Attestation Statements:   Reviewed by clinician on day of visit: allergies, medications, problem list, medical history, surgical history, family history, social history, and previous encounter notes.  I, Lizbeth Bark, RMA, am acting as Location manager for [employer], DO.   I have reviewed the above documentation for accuracy and completeness, and I agree with the above. Jearld Lesch, DO

## 2021-08-19 ENCOUNTER — Encounter (INDEPENDENT_AMBULATORY_CARE_PROVIDER_SITE_OTHER): Payer: Self-pay | Admitting: Bariatrics

## 2021-08-19 ENCOUNTER — Other Ambulatory Visit (HOSPITAL_COMMUNITY): Payer: Self-pay

## 2021-08-23 ENCOUNTER — Encounter: Payer: Self-pay | Admitting: Family Medicine

## 2021-08-24 ENCOUNTER — Telehealth (INDEPENDENT_AMBULATORY_CARE_PROVIDER_SITE_OTHER): Payer: No Typology Code available for payment source | Admitting: Family Medicine

## 2021-08-24 ENCOUNTER — Other Ambulatory Visit: Payer: Self-pay

## 2021-08-24 ENCOUNTER — Encounter: Payer: Self-pay | Admitting: Family Medicine

## 2021-08-24 VITALS — Temp 100.6°F | Ht 63.0 in | Wt 243.0 lb

## 2021-08-24 DIAGNOSIS — R051 Acute cough: Secondary | ICD-10-CM | POA: Diagnosis not present

## 2021-08-24 DIAGNOSIS — U071 COVID-19: Secondary | ICD-10-CM | POA: Diagnosis not present

## 2021-08-24 MED ORDER — NIRMATRELVIR/RITONAVIR (PAXLOVID)TABLET
3.0000 | ORAL_TABLET | Freq: Two times a day (BID) | ORAL | 0 refills | Status: AC
Start: 2021-08-24 — End: 2021-08-29

## 2021-08-24 MED ORDER — BENZONATATE 200 MG PO CAPS: 200.0000 mg | ORAL_CAPSULE | Freq: Three times a day (TID) | ORAL | 0 refills | Status: DC | PRN

## 2021-08-24 NOTE — Progress Notes (Signed)
Start time: 9:52 End time: 10:16  Virtual Visit via Video Note  I connected with Cindy Carey on 08/24/21 by a video enabled telemedicine application and verified that I am speaking with the correct person using two identifiers.  Location: Patient: home Provider: office   I discussed the limitations of evaluation and management by telemedicine and the availability of in person appointments. The patient expressed understanding and agreed to proceed.  History of Present Illness:  Chief Complaint  Patient presents with   Covid Positive    VIRTUAL positive covid yesterday.Symptoms began Friday. Can't breathe through her nose. Cough, fever, chills and body aches.    11/3 she "felt bad" (fatigue), but no cold symptoms.  She woke up 11/4 with congestion, cold symptoms. Had ST Friday and Sat only. Yesterday she had + COVID test.  She reported to health-at-work who puts her day 0 as 11/4.  Currently reports nasal congestion, coughing, intermittent fever.  +body aches, fatigue.  +sinus pain behind her eyes, cheeks.  Drainage is clear. Headaches are sinus only, denies migraines. Also has R ear pain. Pulse ox 95% yesterday  OTC meds tried Robitussin DM (helps some) Alternating tylenol and motrin Warm packs to the face. Has sinus rinse kit  COVID vaccines x 4, hasn't had bivalent booster  PMH, PSH, SH reviewed  Outpatient Encounter Medications as of 08/24/2021  Medication Sig Note   aspirin EC 81 MG tablet Take 81 mg by mouth daily.    benzonatate (TESSALON) 200 MG capsule Take 1 capsule (200 mg total) by mouth 3 (three) times daily as needed for cough.    Calcium Carbonate-Vit D-Min (CALCIUM 600+D PLUS MINERALS) 600-400 MG-UNIT TABS Take 1 tablet by mouth 2 (two) times daily.    Cholecalciferol (VITAMIN D3) 125 MCG (5000 UT) CAPS Take 1 capsule by mouth daily.    doxycycline (VIBRAMYCIN) 100 MG capsule Take 1 capsule (100 mg total) by mouth daily.    empagliflozin (JARDIANCE) 25  MG TABS tablet Take 1 tablet (25 mg total) by mouth daily with breakfast.    famotidine (PEPCID) 20 MG tablet Take 20 mg by mouth at bedtime.    glucose blood test strip Test  2 (two) times daily.    glucose monitoring kit (FREESTYLE) monitoring kit 1 each by Does not apply route as needed for other.    guaiFENesin-dextromethorphan (ROBITUSSIN DM) 100-10 MG/5ML syrup Take 15 mLs by mouth every 4 (four) hours as needed for cough. 08/24/2021: Last dose 11pm   Lancets (FREESTYLE) lancets 1 each by Other route 2 (two) times daily. Test twice daily Dx:E11.9    lisinopril (ZESTRIL) 10 MG tablet Take 1 tablet (10 mg total) by mouth daily.    Magnesium 250 MG TABS Take 250 mg by mouth daily. 03/31/2016: Takes for migraine prevention   metFORMIN (GLUCOPHAGE-XR) 500 MG 24 hr tablet Take 2 tablets (1,000 mg total) by mouth 2 (two) times daily.    metroNIDAZOLE (METROGEL) 0.75 % gel APPLY A SMALL AMOUNT TO AFFECTED AREA ON FACE TWICE DAILY    nirmatrelvir/ritonavir EUA (PAXLOVID) 20 x 150 MG & 10 x 100MG TABS Take 3 tablets by mouth 2 (two) times daily for 5 days. (Take nirmatrelvir 150 mg two tablets twice daily for 5 days and ritonavir 100 mg one tablet twice daily for 5 days) Patient GFR is 89    Semaglutide,0.25 or 0.5MG/DOS, (OZEMPIC, 0.25 OR 0.5 MG/DOSE,) 2 MG/1.5ML SOPN Inject 0.5 mg into the skin once a week.    simvastatin (ZOCOR)  40 MG tablet Take 1 tablet (40 mg total) by mouth every evening.    cyclobenzaprine (FLEXERIL) 10 MG tablet Take 1 tablet (10 mg total) by mouth 3 (three) times daily as needed for muscle spasms. (Patient not taking: Reported on 08/24/2021)    EPINEPHrine 0.3 mg/0.3 mL IJ SOAJ injection Inject 0.3 mg into the muscle once as needed for anaphylaxis. (Patient not taking: Reported on 08/24/2021)    ondansetron (ZOFRAN ODT) 4 MG disintegrating tablet Take 1 tablet (4 mg total) by mouth every 8 (eight) hours as needed for nausea or vomiting. (Patient not taking: Reported on 08/24/2021)     No facility-administered encounter medications on file as of 08/24/2021.   NOT taking paxlovid or tessalon prior to today.  Allergies  Allergen Reactions   Bee Venom Anaphylaxis   Shellfish Allergy Anaphylaxis   Invokana [Canagliflozin] Itching   Penicillins Hives   ROS:  URI symptoms, fever, chills, HA per HPI.  No chest pain, shortness of breath.   Mild nausea, no vomiting or diarrhea. No rashes, pain with breathing, or other complaints.   Observations/Objective:  Temp (!) 100.6 F (38.1 C) (Temporal)   Ht 5' 3"  (1.6 m)   Wt 243 lb (110.2 kg)   LMP 10/18/1982   BMI 43.05 kg/m  106/70  Mildly ill-appearing female, speaking easily, in no distress. Congested and coughing during visit. Exam is limited due to virtual nature of the visit.  Assessment and Plan:  COVID-19 virus infection - discussed paxlovid, potential for rebound. Reviewed OTC measures and s/sx complicationsworsening for which she should see re-eval - Plan: nirmatrelvir/ritonavir EUA (PAXLOVID) 20 x 150 MG & 10 x 100MG TABS, benzonatate (TESSALON) 200 MG capsule  Acute cough - Plan: benzonatate (TESSALON) 200 MG capsule   Stop simvastatin.  Do not take this while you are on Paxlovid (can restart the following day after completing the course).  Stay well hydrated. Continue tylenol and/or ibuprofen as needed for pain and fever. Consider switching to mucinex DM 12 hour twice daily (vs continuing the liquid every 4-6 hours).  Continue sinus rinses once or twice daily. You may use decongestant (sudafed)--short acting only, if needed for sinus pain as long as you are monitoring your blood pressure and it remains <130/80  Use the benzonatate as needed for cough. Contact us if this isn't effective, and if a hydrocodone-containing syrup is needed for bedtime.  You need to stay in isolation day 1-5. Follow-up with health at work regarding returning to work (typically it would be on day 6 (11/10) if you are no  longer febrile and respiratory symptoms are improving.  You need to be masked 100% of the time on days 6-10 if leaving isolation, and [facility] requires use of N-95 mask while at work.    Follow Up Instructions:    I discussed the assessment and treatment plan with the patient. The patient was provided an opportunity to ask questions and all were answered. The patient agreed with the plan and demonstrated an understanding of the instructions.   The patient was advised to call back or seek an in-person evaluation if the symptoms worsen or if the condition fails to improve as anticipated.  I spent 25 minutes dedicated to the care of this patient, including pre-visit review of records, face to face time, post-visit ordering of testing and documentation.    Vikki Ports, MD

## 2021-08-24 NOTE — Patient Instructions (Signed)
Stop simvastatin.  Do not take this while you are on Paxlovid (can restart the following day after completing the course).  Stay well hydrated. Continue tylenol and/or ibuprofen as needed for pain and fever. Consider switching to mucinex DM 12 hour twice daily (vs continuing the liquid every 4-6 hours).  Continue sinus rinses once or twice daily. You may use decongestant (sudafed)--short acting only, if needed for sinus pain as long as you are monitoring your blood pressure and it remains <130/80  Use the benzonatate as needed for cough. Contact us if this isn't effective, and if a hydrocodone-containing syrup is needed for bedtime.  You need to stay in isolation day 1-5. Follow-up with health at work regarding returning to work (typically it would be on day 6 (11/10) if you are no longer febrile and respiratory symptoms are improving.  You need to be masked 100% of the time on days 6-10 if leaving isolation, and [facility] requires use of N-95 mask while at work.

## 2021-08-26 ENCOUNTER — Encounter: Payer: Self-pay | Admitting: Family Medicine

## 2021-08-26 MED ORDER — ALBUTEROL SULFATE HFA 108 (90 BASE) MCG/ACT IN AERS: 2.0000 | INHALATION_SPRAY | Freq: Four times a day (QID) | RESPIRATORY_TRACT | 0 refills | Status: DC | PRN

## 2021-08-28 ENCOUNTER — Encounter (HOSPITAL_BASED_OUTPATIENT_CLINIC_OR_DEPARTMENT_OTHER): Payer: No Typology Code available for payment source

## 2021-09-03 ENCOUNTER — Telehealth (HOSPITAL_BASED_OUTPATIENT_CLINIC_OR_DEPARTMENT_OTHER): Payer: Self-pay | Admitting: *Deleted

## 2021-09-03 ENCOUNTER — Ambulatory Visit
Admission: RE | Admit: 2021-09-03 | Discharge: 2021-09-03 | Disposition: A | Discharge status: Home / Self Care | Payer: No Typology Code available for payment source | Source: Ambulatory Visit

## 2021-09-03 ENCOUNTER — Other Ambulatory Visit: Payer: Self-pay

## 2021-09-03 DIAGNOSIS — Z1231 Encounter for screening mammogram for malignant neoplasm of breast: Secondary | ICD-10-CM

## 2021-09-03 NOTE — Telephone Encounter (Signed)
09/03/21 @ 8:28 am spoke with patient regarding new appointment date and time for the Lower arterial doppler .  Patient voiced her understanding.

## 2021-09-04 ENCOUNTER — Encounter (HOSPITAL_BASED_OUTPATIENT_CLINIC_OR_DEPARTMENT_OTHER): Payer: No Typology Code available for payment source

## 2021-09-05 ENCOUNTER — Emergency Department (HOSPITAL_BASED_OUTPATIENT_CLINIC_OR_DEPARTMENT_OTHER)
Admission: EM | Admit: 2021-09-05 | Discharge: 2021-09-05 | Disposition: A | Discharge status: Home / Self Care | Payer: No Typology Code available for payment source | Attending: Student | Admitting: Student

## 2021-09-05 ENCOUNTER — Encounter (HOSPITAL_BASED_OUTPATIENT_CLINIC_OR_DEPARTMENT_OTHER): Payer: Self-pay | Admitting: *Deleted

## 2021-09-05 ENCOUNTER — Other Ambulatory Visit: Payer: Self-pay

## 2021-09-05 DIAGNOSIS — J45909 Unspecified asthma, uncomplicated: Secondary | ICD-10-CM | POA: Diagnosis not present

## 2021-09-05 DIAGNOSIS — Z79899 Other long term (current) drug therapy: Secondary | ICD-10-CM | POA: Insufficient documentation

## 2021-09-05 DIAGNOSIS — I2510 Atherosclerotic heart disease of native coronary artery without angina pectoris: Secondary | ICD-10-CM | POA: Insufficient documentation

## 2021-09-05 DIAGNOSIS — Z7984 Long term (current) use of oral hypoglycemic drugs: Secondary | ICD-10-CM | POA: Diagnosis not present

## 2021-09-05 DIAGNOSIS — X153XXA Contact with hot saucepan or skillet, initial encounter: Secondary | ICD-10-CM | POA: Diagnosis not present

## 2021-09-05 DIAGNOSIS — E1169 Type 2 diabetes mellitus with other specified complication: Secondary | ICD-10-CM | POA: Diagnosis not present

## 2021-09-05 DIAGNOSIS — T23241A Burn of second degree of multiple right fingers (nail), including thumb, initial encounter: Secondary | ICD-10-CM | POA: Diagnosis not present

## 2021-09-05 DIAGNOSIS — E785 Hyperlipidemia, unspecified: Secondary | ICD-10-CM | POA: Diagnosis not present

## 2021-09-05 DIAGNOSIS — Z7982 Long term (current) use of aspirin: Secondary | ICD-10-CM | POA: Insufficient documentation

## 2021-09-05 DIAGNOSIS — T23041A Burn of unspecified degree of multiple right fingers (nail), including thumb, initial encounter: Secondary | ICD-10-CM | POA: Diagnosis present

## 2021-09-05 DIAGNOSIS — I10 Essential (primary) hypertension: Secondary | ICD-10-CM | POA: Diagnosis not present

## 2021-09-05 MED ORDER — OXYCODONE HCL 5 MG PO TABS: 5.0000 mg | ORAL_TABLET | ORAL | 0 refills | Status: DC | PRN

## 2021-09-05 MED ORDER — BACITRACIN ZINC 500 UNIT/GM EX OINT
TOPICAL_OINTMENT | Freq: Two times a day (BID) | CUTANEOUS | Status: DC
  Filled 2021-09-05: qty 28.35

## 2021-09-05 MED ORDER — HYDROCODONE-ACETAMINOPHEN 5-325 MG PO TABS
1.0000 | ORAL_TABLET | Freq: Once | ORAL | Status: AC
  Administered 2021-09-05: 1 via ORAL
  Filled 2021-09-05: qty 1
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
## 2021-09-05 NOTE — ED Notes (Addendum)
Pt d/c home per MD order. Discharge summary reviewed with pt, pt verbalizes understanding. Ambulatory off unit. No s/s of acute distress noted. Discharged home with husband, who is driving home.
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
## 2021-09-06 ENCOUNTER — Encounter: Payer: Self-pay | Admitting: Family Medicine
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
## 2021-09-07 ENCOUNTER — Encounter: Payer: Self-pay | Admitting: Family Medicine

## 2021-09-07 ENCOUNTER — Other Ambulatory Visit: Payer: Self-pay

## 2021-09-07 ENCOUNTER — Ambulatory Visit (INDEPENDENT_AMBULATORY_CARE_PROVIDER_SITE_OTHER): Payer: No Typology Code available for payment source | Admitting: Family Medicine

## 2021-09-07 VITALS — BP 114/66 | HR 72 | Temp 98.7°F | Ht 63.0 in | Wt 243.2 lb

## 2021-09-07 DIAGNOSIS — T23231D Burn of second degree of multiple right fingers (nail), not including thumb, subsequent encounter: Secondary | ICD-10-CM | POA: Diagnosis not present
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
## 2021-09-07 NOTE — Patient Instructions (Signed)
  Try taking Tylenol more regularly throughout the day (extra strength). You may also use Aleve twice daily (and can double to 2 pills twice daily if your stomach tolerates this) Use the oxycodone at bedtime. If you need more pain medication during the day, and prefer not to take the oxycodone, let us know.   (We can look to see if there is any data on gabapentin, vs prescribing tramadol in its place).  You should be getting a call from Dr. Claudia Desanctis (plastic surgery) regarding an appointment. In the meantime, continue with current wound care and bacitracin. Try keeping the range of motion intact.[name]

## 2021-09-08 ENCOUNTER — Encounter: Payer: Self-pay | Admitting: Family Medicine

## 2021-09-09 ENCOUNTER — Ambulatory Visit: Payer: No Typology Code available for payment source | Admitting: Plastic Surgery

## 2021-09-09 ENCOUNTER — Other Ambulatory Visit: Payer: Self-pay

## 2021-09-09 ENCOUNTER — Telehealth: Payer: Self-pay | Admitting: *Deleted

## 2021-09-09 VITALS — BP 118/78 | HR 86 | Ht 63.0 in | Wt 245.8 lb

## 2021-09-09 DIAGNOSIS — T23041A Burn of unspecified degree of multiple right fingers (nail), including thumb, initial encounter: Secondary | ICD-10-CM | POA: Diagnosis not present

## 2021-09-09 NOTE — Telephone Encounter (Signed)
Okay per PCP, Dr. Tomi Bamberger for patient to see Dr. Mingo Amber today, 09/09/21 @ 12:15pm. Patient will contact [facility] to let them know.

## 2021-09-09 NOTE — Progress Notes (Signed)
Referring Provider Rita Ohara, MD [address] [place],  [place] [address]   CC:  Chief Complaint  Patient presents with   Advice Only      Cindy Carey is an 65 y.o. female.  HPI: Patient presents to discuss a burn on her right hand.  She was cooking the other day when she grasp a hot pot handle.  She burned her hand and was seen in the emergency room.  They recommended wound care and she followed up with her primary care provider.  Due to the appearance of the burn at that point she is here to be evaluated.  She feels that over the past couple days she has noticed a fairly significant improvement in the appearance of the burn itself.  She has been dressing it with bacitracin and a wrap.  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  Allergies  Allergen Reactions   Bee Venom Anaphylaxis   Shellfish Allergy Anaphylaxis   Invokana [Canagliflozin] Itching   Penicillins Hives    Outpatient Encounter Medications as of 09/09/2021  Medication Sig Note   aspirin EC 81 MG tablet Take 81 mg by mouth daily.    Calcium Carbonate-Vit D-Min (CALCIUM 600+D PLUS MINERALS) 600-400 MG-UNIT TABS Take 1 tablet by mouth 2 (two) times daily.    Cholecalciferol (VITAMIN D3) 125 MCG (5000 UT) CAPS Take 1 capsule by mouth daily.    doxycycline (VIBRAMYCIN) 100 MG capsule Take 1 capsule (100 mg total) by mouth daily.    empagliflozin (JARDIANCE) 25 MG TABS tablet Take 1 tablet (25 mg total) by mouth daily with breakfast.    EPINEPHrine 0.3 mg/0.3 mL IJ SOAJ injection Inject 0.3 mg into the muscle once as needed for anaphylaxis.    famotidine (PEPCID) 20 MG tablet Take 20 mg by mouth at bedtime.    glucose blood test strip Test  2 (two) times daily.    glucose monitoring kit (FREESTYLE) monitoring kit 1 each by Does not apply route as needed for other.    Lancets (FREESTYLE) lancets 1 each by Other route 2 (two) times daily. Test twice daily Dx:E11.9    lisinopril (ZESTRIL) 10 MG tablet  Take 1 tablet (10 mg total) by mouth daily.    Magnesium 250 MG TABS Take 250 mg by mouth daily. 03/31/2016: Takes for migraine prevention   metFORMIN (GLUCOPHAGE-XR) 500 MG 24 hr tablet Take 2 tablets (1,000 mg total) by mouth 2 (two) times daily.    metroNIDAZOLE (METROGEL) 0.75 % gel APPLY A SMALL AMOUNT TO AFFECTED AREA ON FACE TWICE DAILY    ondansetron (ZOFRAN ODT) 4 MG disintegrating tablet Take 1 tablet (4 mg total) by mouth every 8 (eight) hours as needed for nausea or vomiting. 09/07/2021: Uses with Ozempic, if needed   Semaglutide,0.25 or 0.5MG/DOS, (OZEMPIC, 0.25 OR 0.5 MG/DOSE,) 2 MG/1.5ML SOPN Inject 0.5 mg into the skin once a week.    simvastatin (ZOCOR) 40 MG tablet Take 1 tablet (40 mg total) by mouth every evening.    oxyCODONE (ROXICODONE) 5 MG immediate release tablet Take 1 tablet (5 mg total) by mouth every 4 (four) hours as needed for severe pain. (Patient not taking: Reported on 09/09/2021) 09/07/2021: Took one yesterday afternoon   No facility-administered encounter medications on file as of 09/09/2021.     Past Medical History:  Diagnosis Date   Anemia    Asthma    Atypical ductal hyperplasia of left breast    Back pain  Blood transfusion without reported diagnosis 1983   CAD (coronary artery disease)    Diabetes mellitus without complication ([place]) 1610   T2DM   Dyspareunia    Exercise-induced asthma    worse in winter   Food allergy    H/O bone density study 2013   H/O cold sores    H/O colonoscopy 2009   History of endometriosis    Hyperlipidemia    Hypertension    Injury of tendon of rotator cuff 2015   gym injury (right)   Joint pain    Knee pain 08/2015   L>R   Migraine with typical aura    Mild CAD 04/04/2018   Mild disease on coronary CT-A 01/2018.   Morbid obesity ([place]) 09/04/2012   BMI 49.4 kg/m^2    Osteoarthritis    Plantar fasciitis, left 2019   Trigger finger of left thumb 08/2018   injected by Dr. Fredna Dow 09/18/18    Past Surgical  History:  Procedure Laterality Date   BREAST BIOPSY  2010   BREAST LUMPECTOMY  2010   Benign   BREAST LUMPECTOMY WITH RADIOACTIVE SEED LOCALIZATION Left 07/23/2015   Procedure: LEFT BREAST SEED GUIDED EXCISION;  Surgeon: Rolm Bookbinder, MD;  Location: [facility];  Service: General;  Laterality: Left;   BREATH TEK H PYLORI  08/28/2012   Procedure: BREATH TEK H PYLORI;  Surgeon: Pedro Earls, MD;  Location: [facility];  Service: General;  Laterality: N/A;   CHOLECYSTECTOMY  2007   EXPLORATORY LAPAROTOMY  1983   endometriosis   GANGLION CYST EXCISION Left 1999   Foot   TOTAL ABDOMINAL HYSTERECTOMY W/ BILATERAL SALPINGOOPHORECTOMY  1984   endometriosis (and appendectomy)    Family History  Problem Relation Age of Onset   Diabetes Father    Heart attack Father 40   Heart disease Father    Hypertension Father    Peripheral Artery Disease Father        stents at 78   Diabetes Paternal Grandfather    Heart disease Paternal Grandfather    Osteoporosis Maternal Grandmother    Heart failure Maternal Grandmother    Thyroid disease Mother        hypothyroidism   Heart disease Mother 39       CABG at 9   Hypertension Mother    Diverticulitis Mother 27       perforation, required surgery   Pneumonia Sister    Cancer Maternal Grandfather        lung   Dementia Paternal Grandmother    Cancer Paternal Grandmother        melanoma on her foot (not related to death)   Breast cancer Paternal Aunt        58's   Cancer Maternal Aunt        lung   Cancer Maternal Uncle        lung   Breast cancer Cousin        mets to lung   Congestive Heart Failure Maternal Uncle    Breast cancer Cousin     Social History   Social History Narrative   Married, no children. 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     Review of Systems General: Denies fevers, chills, weight  loss CV: Denies chest pain, shortness of breath, palpitations  Physical Exam Vitals with BMI 09/09/2021 09/07/2021 09/05/2021  Height  _0  _1  -  Weight 245 lbs 13 oz 243 lbs 3 oz -  BMI 44.01 02.72 -  Systolic 536 644 034  Diastolic 78 66 52  Pulse 86 72 85    General:  No acute distress,  Alert and oriented, Non-Toxic, Normal speech and affect Right hand: Fingers well-perfused with normal capillary refill to palp radial pulse.  Sensation is intact throughout.  She has full range of motion.  She has scattered partial-thickness burns over the fingertips of the small finger and ring finger extending across the still palm onto the thenar eminence.  There is a few areas of blister but overall this looks to be fairly superficial.  No evidence of infection.  Assessment/Plan Patient presents with a superficial partial-thickness burn over the palm of the right hand.  I have recommended bacitracin and dressings for comfort.  As the burn gradually heals in the comfort improves she can adjust the dressing to maximize her comfort but minimize interference with function as pain allows.  I think this will go on to heal in the next week or 2.  I will plan to set an appointment to see her in 2 to 3 weeks in the event that she feels that she needs it.  If it heals fine and she is content with the situation then she can cancel that visit.  All her questions were answered.  Cindra Presume 09/09/2021, 5:33 PM

## 2021-09-14 ENCOUNTER — Other Ambulatory Visit (HOSPITAL_COMMUNITY): Payer: Self-pay

## 2021-09-15 ENCOUNTER — Encounter: Payer: Self-pay | Admitting: Family Medicine

## 2021-09-15 ENCOUNTER — Ambulatory Visit (INDEPENDENT_AMBULATORY_CARE_PROVIDER_SITE_OTHER): Payer: No Typology Code available for payment source | Admitting: Bariatrics

## 2021-09-15 ENCOUNTER — Encounter (INDEPENDENT_AMBULATORY_CARE_PROVIDER_SITE_OTHER): Payer: Self-pay | Admitting: Bariatrics

## 2021-09-15 ENCOUNTER — Other Ambulatory Visit (HOSPITAL_COMMUNITY): Payer: Self-pay

## 2021-09-15 ENCOUNTER — Other Ambulatory Visit: Payer: Self-pay

## 2021-09-15 VITALS — BP 136/88 | HR 71 | Temp 98.0°F | Ht 63.0 in | Wt 243.0 lb

## 2021-09-15 DIAGNOSIS — E1169 Type 2 diabetes mellitus with other specified complication: Secondary | ICD-10-CM | POA: Diagnosis not present

## 2021-09-15 DIAGNOSIS — E119 Type 2 diabetes mellitus without complications: Secondary | ICD-10-CM

## 2021-09-15 DIAGNOSIS — I10 Essential (primary) hypertension: Secondary | ICD-10-CM | POA: Diagnosis not present

## 2021-09-15 DIAGNOSIS — Z6841 Body Mass Index (BMI) 40.0 and over, adult: Secondary | ICD-10-CM

## 2021-09-15 MED ORDER — OZEMPIC (0.25 OR 0.5 MG/DOSE) 2 MG/1.5ML ~~LOC~~ SOPN
0.5000 mg | PEN_INJECTOR | SUBCUTANEOUS | 0 refills | Status: DC
  Filled 2021-09-15: qty 1.5, 28d supply, fill #0

## 2021-09-15 MED ORDER — EMPAGLIFLOZIN 25 MG PO TABS
25.0000 mg | ORAL_TABLET | Freq: Every day | ORAL | 1 refills | Status: DC
  Filled 2021-09-15: qty 90, 90d supply, fill #0
  Filled 2021-12-09: qty 90, 90d supply, fill #1

## 2021-09-15 NOTE — Progress Notes (Signed)
Chief Complaint:   OBESITY Cindy Carey is here to discuss her progress with her obesity treatment plan along with follow-up of her obesity related diagnoses. Cindy Carey is on the Category 1 Plan and states she is following her eating plan approximately 50% of the time. Cindy Carey states she is walking for 30 minutes 7 times per week.  Today's visit was #: 7 Starting weight: 251 lbs Starting date: 05/20/2021 Today's weight: 243 lbs Today's date: 09/15/2021 Total lbs lost to date: 8 lbs Total lbs lost since last in-office visit: 2 lbs  Interim History: Cindy Carey is down an additional 2 lbs since the holidays.  Subjective:   1. Type 2 diabetes mellitus with other specified complication, without long-term current use of insulin (HCC) Cindy Carey is currently taking Jardiance, Metformin and Ozempic and she is tolerating it well.   2. Essential hypertension Katy's blood pressure is reasonably controlled. Her last blood pressure was 136/88.  Assessment/Plan:   1. Type 2 diabetes mellitus with other specified complication, without long-term current use of insulin (HCC) We will refill Ozempic 0.5 mg with no refills. Good blood sugar control is important to decrease the likelihood of diabetic complications such as nephropathy, neuropathy, limb loss, blindness, coronary artery disease, and death. Intensive lifestyle modification including diet, exercise and weight loss are the first line of treatment for diabetes.   - Semaglutide,0.25 or 0.5MG /DOS, (OZEMPIC, 0.25 OR 0.5 MG/DOSE,) 2 MG/1.5ML SOPN; Inject 0.5 mg into the skin once a week.  Dispense: 1.5 mL; Refill: 0  2. Essential hypertension Cindy Carey will continue her medications. She is working on healthy weight loss and exercise to improve blood pressure control. We will watch for signs of hypotension as she continues her lifestyle modifications.  3. Obesity, current BMI 43.2 Cindy Carey is currently in the action stage of change. As such, her goal is to continue with weight  loss efforts. She has agreed to the Category 1 Plan.   Cindy Carey will adhere closely to the plan. She will continue intentional eating.  Exercise goals:  As is.  Behavioral modification strategies: increasing lean protein intake, decreasing simple carbohydrates, increasing vegetables, increasing water intake, decreasing eating out, no skipping meals, meal planning and cooking strategies, keeping healthy foods in the home, and planning for success.  Cindy Carey has agreed to follow-up with our clinic in 4-6 weeks. She was informed of the importance of frequent follow-up visits to maximize her success with intensive lifestyle modifications for her multiple health conditions.   Objective:   Blood pressure 136/88, pulse 71, temperature 98 F (36.7 C), height 5\' 3"  (1.6 m), weight 243 lb (110.2 kg), last menstrual period 10/18/1982, SpO2 97 %. Body mass index is 43.05 kg/m.  General: Cooperative, alert, well developed, in no acute distress. HEENT: Conjunctivae and lids unremarkable. Cardiovascular: Regular rhythm.  Lungs: Normal work of breathing. Neurologic: No focal deficits.   Lab Results  Component Value Date   CREATININE 0.75 03/09/2021   BUN 10 03/09/2021   NA 141 03/09/2021   K 4.6 03/09/2021   CL 106 03/09/2021   CO2 20 03/09/2021   Lab Results  Component Value Date   ALT 32 03/09/2021   AST 49 (H) 03/09/2021   ALKPHOS 159 (H) 03/09/2021   BILITOT 0.5 03/09/2021   Lab Results  Component Value Date   HGBA1C 6.4 (A) 06/08/2021   HGBA1C 7.8 (A) 03/09/2021   HGBA1C 7.0 (A) 07/09/2020   HGBA1C 6.5 (A) 12/20/2019   HGBA1C 6.6 (H) 07/03/2019   Lab Results  Component Value Date   INSULIN 98.4 (H) 05/20/2021   INSULIN 40.3 (H) 03/23/2019   INSULIN 59.1 (H) 07/13/2018   Lab Results  Component Value Date   TSH 1.540 03/09/2021   Lab Results  Component Value Date   CHOL 150 03/09/2021   HDL 77 03/09/2021   LDLCALC 55 03/09/2021   TRIG 97 03/09/2021   CHOLHDL 1.9 03/09/2021    Lab Results  Component Value Date   VD25OH 41.4 03/23/2019   VD25OH 48.7 12/14/2018   VD25OH 50.0 07/13/2018   Lab Results  Component Value Date   WBC 6.5 03/09/2021   HGB 11.6 03/09/2021   HCT 37.5 03/09/2021   MCV 85 03/09/2021   PLT 86 (LL) 03/09/2021   Lab Results  Component Value Date   IRON 84 12/07/2017   Attestation Statements:   Reviewed by clinician on day of visit: allergies, medications, problem list, medical history, surgical history, family history, social history, and previous encounter notes.  I, Lizbeth Bark, RMA, am acting as Location manager for [employer], DO.   I have reviewed the above documentation for accuracy and completeness, and I agree with the above. Jearld Lesch, DO

## 2021-09-16 ENCOUNTER — Institutional Professional Consult (permissible substitution): Payer: No Typology Code available for payment source | Admitting: Plastic Surgery

## 2021-09-22 ENCOUNTER — Other Ambulatory Visit: Payer: Self-pay

## 2021-09-22 ENCOUNTER — Ambulatory Visit (INDEPENDENT_AMBULATORY_CARE_PROVIDER_SITE_OTHER): Payer: No Typology Code available for payment source

## 2021-09-22 DIAGNOSIS — I739 Peripheral vascular disease, unspecified: Secondary | ICD-10-CM | POA: Diagnosis not present

## 2021-09-22 DIAGNOSIS — R0989 Other specified symptoms and signs involving the circulatory and respiratory systems: Secondary | ICD-10-CM

## 2021-09-24 ENCOUNTER — Ambulatory Visit: Payer: No Typology Code available for payment source | Admitting: Plastic Surgery

## 2021-09-27 ENCOUNTER — Other Ambulatory Visit: Payer: Self-pay | Admitting: Family Medicine

## 2021-09-27 ENCOUNTER — Encounter: Payer: Self-pay | Admitting: Family Medicine

## 2021-09-27 DIAGNOSIS — E785 Hyperlipidemia, unspecified: Secondary | ICD-10-CM

## 2021-09-27 DIAGNOSIS — I700 Atherosclerosis of aorta: Secondary | ICD-10-CM

## 2021-09-27 MED ORDER — CYCLOBENZAPRINE HCL 10 MG PO TABS
10.0000 mg | ORAL_TABLET | Freq: Three times a day (TID) | ORAL | 0 refills | Status: DC | PRN
  Filled 2021-09-27: qty 30, 10d supply, fill #0

## 2021-09-27 MED ORDER — PROMETHAZINE HCL 25 MG PO TABS
25.0000 mg | ORAL_TABLET | Freq: Three times a day (TID) | ORAL | 0 refills | Status: DC | PRN
  Filled 2021-09-27: qty 30, 10d supply, fill #0

## 2021-09-28 ENCOUNTER — Other Ambulatory Visit (HOSPITAL_COMMUNITY): Payer: Self-pay

## 2021-09-28 MED ORDER — SIMVASTATIN 40 MG PO TABS
40.0000 mg | ORAL_TABLET | Freq: Every evening | ORAL | 1 refills | Status: DC
  Filled 2021-09-28: qty 90, 90d supply, fill #0
  Filled 2021-12-25: qty 90, 90d supply, fill #1

## 2021-10-01 ENCOUNTER — Encounter (INDEPENDENT_AMBULATORY_CARE_PROVIDER_SITE_OTHER): Payer: Self-pay | Admitting: Bariatrics

## 2021-10-01 NOTE — Telephone Encounter (Signed)
Could you please advise pt on whether pt should increase Ozempic dose now or wait until next visit and discuss further with Dr. Owens Shark?

## 2021-10-04 ENCOUNTER — Other Ambulatory Visit: Payer: Self-pay | Admitting: Family Medicine

## 2021-10-04 DIAGNOSIS — E119 Type 2 diabetes mellitus without complications: Secondary | ICD-10-CM

## 2021-10-05 ENCOUNTER — Other Ambulatory Visit (HOSPITAL_COMMUNITY): Payer: Self-pay

## 2021-10-05 MED ORDER — METFORMIN HCL ER 500 MG PO TB24
1000.0000 mg | ORAL_TABLET | Freq: Two times a day (BID) | ORAL | 0 refills | Status: DC
  Filled 2021-10-05: qty 360, 90d supply, fill #0

## 2021-10-15 ENCOUNTER — Ambulatory Visit: Payer: No Typology Code available for payment source

## 2021-10-26 ENCOUNTER — Other Ambulatory Visit (HOSPITAL_COMMUNITY): Payer: Self-pay

## 2021-10-26 ENCOUNTER — Encounter (INDEPENDENT_AMBULATORY_CARE_PROVIDER_SITE_OTHER): Payer: Self-pay | Admitting: Bariatrics

## 2021-10-26 ENCOUNTER — Encounter: Payer: Self-pay | Admitting: Family Medicine

## 2021-10-26 ENCOUNTER — Other Ambulatory Visit: Payer: Self-pay

## 2021-10-26 ENCOUNTER — Ambulatory Visit (INDEPENDENT_AMBULATORY_CARE_PROVIDER_SITE_OTHER): Payer: No Typology Code available for payment source | Admitting: Bariatrics

## 2021-10-26 VITALS — BP 114/76 | HR 73 | Temp 97.9°F | Ht 63.0 in | Wt 237.0 lb

## 2021-10-26 DIAGNOSIS — E1169 Type 2 diabetes mellitus with other specified complication: Secondary | ICD-10-CM | POA: Diagnosis not present

## 2021-10-26 DIAGNOSIS — Z6841 Body Mass Index (BMI) 40.0 and over, adult: Secondary | ICD-10-CM | POA: Diagnosis not present

## 2021-10-26 DIAGNOSIS — I10 Essential (primary) hypertension: Secondary | ICD-10-CM | POA: Diagnosis not present

## 2021-10-26 MED ORDER — SEMAGLUTIDE (1 MG/DOSE) 4 MG/3ML ~~LOC~~ SOPN
1.0000 mg | PEN_INJECTOR | SUBCUTANEOUS | 0 refills | Status: DC
Start: 2021-10-26 — End: 2021-11-16
  Filled 2021-10-26: qty 3, 28d supply, fill #0

## 2021-10-26 NOTE — Progress Notes (Signed)
Chief Complaint:   OBESITY Cindy Carey is here to discuss her progress with her obesity treatment plan along with follow-up of her obesity related diagnoses. Cindy Carey is on the Category 1 Plan and states she is following her eating plan approximately 50% of the time. Cindy Carey states she is walking for 30 minutes 7 times per week.  Today's visit was #: 8 Starting weight: 251 lbs Starting date: 05/20/2021 Today's weight: 237 lbs Today's date: 10/26/2021 Total lbs lost to date: 14 lbs Total lbs lost since last in-office visit: 6 lbs  Interim History: Cindy Carey is down 6 lbs since her last visit. She had a cold in the interim.   Subjective:   1. Type 2 diabetes mellitus with other specified complication, without long-term current use of insulin (HCC) Cindy Carey is taking Jardiance, Glucoophage and Ozempic. She denies no side effects.   2. Essential hypertension Erline's blood pressure is controlled. Her last blood pressure was 136/88.  Assessment/Plan:   1. Type 2 diabetes mellitus with other specified complication, without long-term current use of insulin (HCC) Cindy Carey will continue taking her medications. Cindy Carey agrees to increase dose Ozempic 1 mg with no refills. Good blood sugar control is important to decrease the likelihood of diabetic complications such as nephropathy, neuropathy, limb loss, blindness, coronary artery disease, and death. Intensive lifestyle modification including diet, exercise and weight loss are the first line of treatment for diabetes.  - Semaglutide, 1 MG/DOSE, 4 MG/3ML SOPN; Inject 1 mg as directed once a week.  Dispense: 3 mL; Refill: 0  2. Essential hypertension [name] will continue taking her medications. She is working on healthy weight loss and exercise to improve blood pressure control. We will watch for signs of hypotension as she continues her lifestyle modifications.  3. Obesity, current BMI 42.1 Cindy Carey is currently in the action stage of change. As such, her goal is to continue  with weight loss efforts. She has agreed to the Category 1 Plan.   Cindy Carey will continue meal planning and she will continue intentional eating.   Exercise goals:  As is.  Behavioral modification strategies: increasing lean protein intake, decreasing simple carbohydrates, increasing vegetables, increasing water intake, decreasing eating out, no skipping meals, meal planning and cooking strategies, keeping healthy foods in the home, and planning for success.  Cindy Carey has agreed to follow-up with our clinic in 3 weeks. She was informed of the importance of frequent follow-up visits to maximize her success with intensive lifestyle modifications for her multiple health conditions.   Objective:   Blood pressure 114/76, pulse 73, temperature 97.9 F (36.6 C), height 5\' 3"  (1.6 m), weight 237 lb (107.5 kg), last menstrual period 10/18/1982, SpO2 95 %. Body mass index is 41.98 kg/m.  General: Cooperative, alert, well developed, in no acute distress. HEENT: Conjunctivae and lids unremarkable. Cardiovascular: Regular rhythm.  Lungs: Normal work of breathing. Neurologic: No focal deficits.   Lab Results  Component Value Date   CREATININE 0.75 03/09/2021   BUN 10 03/09/2021   NA 141 03/09/2021   K 4.6 03/09/2021   CL 106 03/09/2021   CO2 20 03/09/2021   Lab Results  Component Value Date   ALT 32 03/09/2021   AST 49 (H) 03/09/2021   ALKPHOS 159 (H) 03/09/2021   BILITOT 0.5 03/09/2021   Lab Results  Component Value Date   HGBA1C 6.4 (A) 06/08/2021   HGBA1C 7.8 (A) 03/09/2021   HGBA1C 7.0 (A) 07/09/2020   HGBA1C 6.5 (A) 12/20/2019   HGBA1C 6.6 (H)  07/03/2019   Lab Results  Component Value Date   INSULIN 98.4 (H) 05/20/2021   INSULIN 40.3 (H) 03/23/2019   INSULIN 59.1 (H) 07/13/2018   Lab Results  Component Value Date   TSH 1.540 03/09/2021   Lab Results  Component Value Date   CHOL 150 03/09/2021   HDL 77 03/09/2021   LDLCALC 55 03/09/2021   TRIG 97 03/09/2021   CHOLHDL 1.9  03/09/2021   Lab Results  Component Value Date   VD25OH 41.4 03/23/2019   VD25OH 48.7 12/14/2018   VD25OH 50.0 07/13/2018   Lab Results  Component Value Date   WBC 6.5 03/09/2021   HGB 11.6 03/09/2021   HCT 37.5 03/09/2021   MCV 85 03/09/2021   PLT 86 (LL) 03/09/2021   Lab Results  Component Value Date   IRON 84 12/07/2017   Attestation Statements:   Reviewed by clinician on day of visit: allergies, medications, problem list, medical history, surgical history, family history, social history, and previous encounter notes.  I, Lizbeth Bark, RMA, am acting as Location manager for [employer], DO.  I have reviewed the above documentation for accuracy and completeness, and I agree with the above. Jearld Lesch, DO

## 2021-10-27 ENCOUNTER — Encounter (INDEPENDENT_AMBULATORY_CARE_PROVIDER_SITE_OTHER): Payer: Self-pay | Admitting: Bariatrics

## 2021-11-03 LAB — HM DIABETES EYE EXAM

## 2021-11-11 ENCOUNTER — Other Ambulatory Visit (HOSPITAL_COMMUNITY): Payer: Self-pay

## 2021-11-11 ENCOUNTER — Encounter: Payer: Self-pay | Admitting: *Deleted

## 2021-11-12 ENCOUNTER — Encounter: Payer: Self-pay | Admitting: Family Medicine

## 2021-11-16 ENCOUNTER — Other Ambulatory Visit: Payer: Self-pay

## 2021-11-16 ENCOUNTER — Ambulatory Visit (INDEPENDENT_AMBULATORY_CARE_PROVIDER_SITE_OTHER): Payer: No Typology Code available for payment source | Admitting: Bariatrics

## 2021-11-16 ENCOUNTER — Other Ambulatory Visit (HOSPITAL_COMMUNITY): Payer: Self-pay

## 2021-11-16 ENCOUNTER — Encounter (INDEPENDENT_AMBULATORY_CARE_PROVIDER_SITE_OTHER): Payer: Self-pay | Admitting: Bariatrics

## 2021-11-16 VITALS — BP 120/79 | HR 84 | Temp 97.8°F | Ht 63.0 in | Wt 235.0 lb

## 2021-11-16 DIAGNOSIS — E669 Obesity, unspecified: Secondary | ICD-10-CM

## 2021-11-16 DIAGNOSIS — Z6841 Body Mass Index (BMI) 40.0 and over, adult: Secondary | ICD-10-CM | POA: Diagnosis not present

## 2021-11-16 DIAGNOSIS — I10 Essential (primary) hypertension: Secondary | ICD-10-CM | POA: Diagnosis not present

## 2021-11-16 DIAGNOSIS — E1169 Type 2 diabetes mellitus with other specified complication: Secondary | ICD-10-CM

## 2021-11-16 DIAGNOSIS — E1159 Type 2 diabetes mellitus with other circulatory complications: Secondary | ICD-10-CM | POA: Diagnosis not present

## 2021-11-16 MED ORDER — SEMAGLUTIDE (2 MG/DOSE) 8 MG/3ML ~~LOC~~ SOPN
2.0000 mg | PEN_INJECTOR | SUBCUTANEOUS | 0 refills | Status: DC
  Filled 2021-11-16: qty 3, 28d supply, fill #0

## 2021-11-16 NOTE — Progress Notes (Signed)
Chief Complaint:   OBESITY Cindy Carey is here to discuss her progress with her obesity treatment plan along with follow-up of her obesity related diagnoses. Cindy Carey is on the Category 1 Plan and states she is following her eating plan approximately 90% of the time. Cindy Carey states she is walking for 30 minutes 7 times per week.  Today's visit was #: 9 Starting weight: 251 lbs Starting date: 05/20/2021 Today's weight: 235 lbs Today's date: 11/16/2021 Total lbs lost to date: 16 lbs Total lbs lost since last in-office visit: 2 lbs  Interim History: Cindy Carey is down an additional 2 lbs.  Subjective:   1. Type 2 diabetes mellitus with other specified complication, without long-term current use of insulin (HCC) Cindy Carey is taking her medications as directed. She denies side effects.  2. Essential hypertension Cindy Carey's blood pressure is controlled. Her last blood pressure was 114/76.  Assessment/Plan:   1. Type 2 diabetes mellitus with other specified complication, without long-term current use of insulin (HCC) We will refill Ozempic 2 mg for 1 month with no refills. Good blood sugar control is important to decrease the likelihood of diabetic complications such as nephropathy, neuropathy, limb loss, blindness, coronary artery disease, and death. Intensive lifestyle modification including diet, exercise and weight loss are the first line of treatment for diabetes.   - Semaglutide, 2 MG/DOSE, 8 MG/3ML SOPN; Inject 2 mg as directed once a week.  Dispense: 3 mL; Refill: 0  2. Essential hypertension Cindy Carey will continue taking her medications. She is working on healthy weight loss and exercise to improve blood pressure control. We will watch for signs of hypotension as she continues her lifestyle modifications.  3. Obesity, current BMI 41.6 Cindy Carey is currently in the action stage of change. As such, her goal is to continue with weight loss efforts. She has agreed to the Category 1 Plan.   Cindy Carey will continue meal  planning and she will continue intentional eating.  Exercise goals:  Cindy Carey will consider going to [place].   Behavioral modification strategies: increasing lean protein intake, decreasing simple carbohydrates, increasing vegetables, increasing water intake, decreasing eating out, no skipping meals, meal planning and cooking strategies, keeping healthy foods in the home, and planning for success.  Cindy Carey has agreed to follow-up with our clinic in 3-4 weeks (fasting). She was informed of the importance of frequent follow-up visits to maximize her success with intensive lifestyle modifications for her multiple health conditions.   Objective:   Blood pressure 120/79, pulse 84, temperature 97.8 F (36.6 C), height 5\' 3"  (1.6 m), weight 235 lb (106.6 kg), last menstrual period 10/18/1982, SpO2 98 %. Body mass index is 41.63 kg/m.  General: Cooperative, alert, well developed, in no acute distress. HEENT: Conjunctivae and lids unremarkable. Cardiovascular: Regular rhythm.  Lungs: Normal work of breathing. Neurologic: No focal deficits.   Lab Results  Component Value Date   CREATININE 0.75 03/09/2021   BUN 10 03/09/2021   NA 141 03/09/2021   K 4.6 03/09/2021   CL 106 03/09/2021   CO2 20 03/09/2021   Lab Results  Component Value Date   ALT 32 03/09/2021   AST 49 (H) 03/09/2021   ALKPHOS 159 (H) 03/09/2021   BILITOT 0.5 03/09/2021   Lab Results  Component Value Date   HGBA1C 6.4 (A) 06/08/2021   HGBA1C 7.8 (A) 03/09/2021   HGBA1C 7.0 (A) 07/09/2020   HGBA1C 6.5 (A) 12/20/2019   HGBA1C 6.6 (H) 07/03/2019   Lab Results  Component Value Date  INSULIN 98.4 (H) 05/20/2021   INSULIN 40.3 (H) 03/23/2019   INSULIN 59.1 (H) 07/13/2018   Lab Results  Component Value Date   TSH 1.540 03/09/2021   Lab Results  Component Value Date   CHOL 150 03/09/2021   HDL 77 03/09/2021   LDLCALC 55 03/09/2021   TRIG 97 03/09/2021   CHOLHDL 1.9 03/09/2021   Lab Results  Component Value Date    VD25OH 41.4 03/23/2019   VD25OH 48.7 12/14/2018   VD25OH 50.0 07/13/2018   Lab Results  Component Value Date   WBC 6.5 03/09/2021   HGB 11.6 03/09/2021   HCT 37.5 03/09/2021   MCV 85 03/09/2021   PLT 86 (LL) 03/09/2021   Lab Results  Component Value Date   IRON 84 12/07/2017   Attestation Statements:   Reviewed by clinician on day of visit: allergies, medications, problem list, medical history, surgical history, family history, social history, and previous encounter notes.  I, Lizbeth Bark, RMA, am acting as Location manager for [employer], DO.  I have reviewed the above documentation for accuracy and completeness, and I agree with the above. Jearld Lesch, DO

## 2021-12-09 ENCOUNTER — Other Ambulatory Visit (HOSPITAL_COMMUNITY): Payer: Self-pay

## 2021-12-14 ENCOUNTER — Other Ambulatory Visit: Payer: Self-pay

## 2021-12-14 ENCOUNTER — Encounter (INDEPENDENT_AMBULATORY_CARE_PROVIDER_SITE_OTHER): Payer: Self-pay | Admitting: Bariatrics

## 2021-12-14 ENCOUNTER — Other Ambulatory Visit (HOSPITAL_COMMUNITY): Payer: Self-pay

## 2021-12-14 ENCOUNTER — Ambulatory Visit (INDEPENDENT_AMBULATORY_CARE_PROVIDER_SITE_OTHER): Payer: No Typology Code available for payment source | Admitting: Bariatrics

## 2021-12-14 VITALS — BP 135/76 | HR 86 | Temp 97.7°F | Ht 63.0 in | Wt 235.0 lb

## 2021-12-14 DIAGNOSIS — Z7985 Long-term (current) use of injectable non-insulin antidiabetic drugs: Secondary | ICD-10-CM

## 2021-12-14 DIAGNOSIS — E669 Obesity, unspecified: Secondary | ICD-10-CM | POA: Diagnosis not present

## 2021-12-14 DIAGNOSIS — E1169 Type 2 diabetes mellitus with other specified complication: Secondary | ICD-10-CM

## 2021-12-14 DIAGNOSIS — E7849 Other hyperlipidemia: Secondary | ICD-10-CM

## 2021-12-14 DIAGNOSIS — Z6841 Body Mass Index (BMI) 40.0 and over, adult: Secondary | ICD-10-CM

## 2021-12-14 MED ORDER — SEMAGLUTIDE (2 MG/DOSE) 8 MG/3ML ~~LOC~~ SOPN
2.0000 mg | PEN_INJECTOR | SUBCUTANEOUS | 0 refills | Status: DC
  Filled 2021-12-14: qty 3, 28d supply, fill #0

## 2021-12-14 NOTE — Progress Notes (Signed)
Chief Complaint:   OBESITY Cindy Carey is here to discuss her progress with her obesity treatment plan along with follow-up of her obesity related diagnoses. Cindy Carey is on the Category 1 Plan and states she is following her eating plan approximately 75% of the time. Cindy Carey states she is walking for 30 minutes 7 times per week.  Today's visit was #: 10 Starting weight: 251 lbs Starting date: 05/20/2021 Today's weight: 235 lbs Today's date: 12/14/2021 Total lbs lost to date: 16 lbs Total lbs lost since last in-office visit: 0  Interim History: Cindy Carey's weight remains the same as her last visit.  Subjective:   1. Type 2 diabetes mellitus with other specified complication, without long-term current use of insulin (HCC) Cindy Carey is currently taking Jardiance, Ozempic and Metformin and she is taking her medications as directed. She denies any lows.   2. Other hyperlipidemia Cindy Carey is taking Zocor currently.   Assessment/Plan:   1. Type 2 diabetes mellitus with other specified complication, without long-term current use of insulin (HCC) We will refill Semaglutide 2 mg for 1 month with no refills. We will check A1C, Insulin and CMP today. Good blood sugar control is important to decrease the likelihood of diabetic complications such as nephropathy, neuropathy, limb loss, blindness, coronary artery disease, and death. Intensive lifestyle modification including diet, exercise and weight loss are the first line of treatment for diabetes.   - Semaglutide, 2 MG/DOSE, 8 MG/3ML SOPN; Inject 2 mg as directed once a week.  Dispense: 3 mL; Refill: 0 - Comprehensive metabolic panel - Hemoglobin A1c - Insulin, random  2. Other hyperlipidemia Cardiovascular risk and specific lipid/LDL goals reviewed.  Cindy Carey will continue taking Zocor. We will check Lipids today. We discussed several lifestyle modifications today and Cindy Carey will continue to work on diet, exercise and weight loss efforts. Orders and follow up as documented  in patient record.   Counseling Intensive lifestyle modifications are the first line treatment for this issue. Dietary changes: Increase soluble fiber. Decrease simple carbohydrates. Exercise changes: Moderate to vigorous-intensity aerobic activity 150 minutes per week if tolerated. Lipid-lowering medications: see documented in medical record.  - Lipid Panel With LDL/HDL Ratio  3. Obesity, current BMI 41.7 Cindy Carey is currently in the action stage of change. As such, her goal is to continue with weight loss efforts. She has agreed to the Category 1 Plan.   Cindy Carey will continue meal planning and she will continue intentional eating. She will keep raw vegetables.  Exercise goals:  As is.  Behavioral modification strategies: increasing lean protein intake, decreasing simple carbohydrates, increasing vegetables, increasing water intake, decreasing eating out, no skipping meals, meal planning and cooking strategies, keeping healthy foods in the home, and planning for success.  Cindy Carey has agreed to follow-up with our clinic in 3-4 weeks. She was informed of the importance of frequent follow-up visits to maximize her success with intensive lifestyle modifications for her multiple health conditions.   Cindy Carey was informed we would discuss her lab results at her next visit unless there is a critical issue that needs to be addressed sooner. Cindy Carey agreed to keep her next visit at the agreed upon time to discuss these results.  Objective:   Blood pressure 135/76, pulse 86, temperature 97.7 F (36.5 C), height 5\' 3"  (1.6 m), weight 235 lb (106.6 kg), last menstrual period 10/18/1982, SpO2 97 %. Body mass index is 41.63 kg/m.  General: Cooperative, alert, well developed, in no acute distress. HEENT: Conjunctivae and lids unremarkable. Cardiovascular: Regular  rhythm.  Lungs: Normal work of breathing. Neurologic: No focal deficits.   Lab Results  Component Value Date   CREATININE 0.75 03/09/2021   BUN 10  03/09/2021   NA 141 03/09/2021   K 4.6 03/09/2021   CL 106 03/09/2021   CO2 20 03/09/2021   Lab Results  Component Value Date   ALT 32 03/09/2021   AST 49 (H) 03/09/2021   ALKPHOS 159 (H) 03/09/2021   BILITOT 0.5 03/09/2021   Lab Results  Component Value Date   HGBA1C 6.4 (A) 06/08/2021   HGBA1C 7.8 (A) 03/09/2021   HGBA1C 7.0 (A) 07/09/2020   HGBA1C 6.5 (A) 12/20/2019   HGBA1C 6.6 (H) 07/03/2019   Lab Results  Component Value Date   INSULIN 98.4 (H) 05/20/2021   INSULIN 40.3 (H) 03/23/2019   INSULIN 59.1 (H) 07/13/2018   Lab Results  Component Value Date   TSH 1.540 03/09/2021   Lab Results  Component Value Date   CHOL 150 03/09/2021   HDL 77 03/09/2021   LDLCALC 55 03/09/2021   TRIG 97 03/09/2021   CHOLHDL 1.9 03/09/2021   Lab Results  Component Value Date   VD25OH 41.4 03/23/2019   VD25OH 48.7 12/14/2018   VD25OH 50.0 07/13/2018   Lab Results  Component Value Date   WBC 6.5 03/09/2021   HGB 11.6 03/09/2021   HCT 37.5 03/09/2021   MCV 85 03/09/2021   PLT 86 (LL) 03/09/2021   Lab Results  Component Value Date   IRON 84 12/07/2017   Attestation Statements:   Reviewed by clinician on day of visit: allergies, medications, problem list, medical history, surgical history, family history, social history, and previous encounter notes.  I, Lizbeth Bark, RMA, am acting as Location manager for [employer], DO.  I have reviewed the above documentation for accuracy and completeness, and I agree with the above. Jearld Lesch, DO

## 2021-12-15 ENCOUNTER — Ambulatory Visit (INDEPENDENT_AMBULATORY_CARE_PROVIDER_SITE_OTHER): Payer: No Typology Code available for payment source

## 2021-12-15 DIAGNOSIS — Z23 Encounter for immunization: Secondary | ICD-10-CM | POA: Diagnosis not present

## 2021-12-15 LAB — COMPREHENSIVE METABOLIC PANEL
ALT: 25 IU/L (ref 0–32)
AST: 32 IU/L (ref 0–40)
Albumin/Globulin Ratio: 1.7 (ref 1.2–2.2)
Albumin: 4.1 g/dL (ref 3.8–4.8)
Alkaline Phosphatase: 140 IU/L — ABNORMAL HIGH (ref 44–121)
BUN/Creatinine Ratio: 13 (ref 12–28)
BUN: 10 mg/dL (ref 8–27)
Bilirubin Total: 0.4 mg/dL (ref 0.0–1.2)
CO2: 22 mmol/L (ref 20–29)
Calcium: 9.5 mg/dL (ref 8.7–10.3)
Chloride: 105 mmol/L (ref 96–106)
Creatinine, Ser: 0.75 mg/dL (ref 0.57–1.00)
Globulin, Total: 2.4 g/dL (ref 1.5–4.5)
Glucose: 118 mg/dL — ABNORMAL HIGH (ref 70–99)
Potassium: 4.4 mmol/L (ref 3.5–5.2)
Sodium: 140 mmol/L (ref 134–144)
Total Protein: 6.5 g/dL (ref 6.0–8.5)
eGFR: 89 mL/min/{1.73_m2} (ref >59)

## 2021-12-15 LAB — LIPID PANEL WITH LDL/HDL RATIO
Cholesterol, Total: 137 mg/dL (ref 100–199)
HDL: 70 mg/dL (ref >39)
LDL Chol Calc (NIH): 49 mg/dL (ref 0–99)
LDL/HDL Ratio: 0.7 ratio (ref 0.0–3.2)
Triglycerides: 100 mg/dL (ref 0–149)
VLDL Cholesterol Cal: 18 mg/dL (ref 5–40)

## 2021-12-15 LAB — HEMOGLOBIN A1C
Est. average glucose Bld gHb Est-mCnc: 120 mg/dL
Hgb A1c MFr Bld: 5.8 % — ABNORMAL HIGH (ref 4.8–5.6)

## 2021-12-15 LAB — INSULIN, RANDOM: INSULIN: 76.3 u[IU]/mL — ABNORMAL HIGH (ref 2.6–24.9)

## 2021-12-20 ENCOUNTER — Telehealth: Payer: No Typology Code available for payment source | Admitting: Emergency Medicine

## 2021-12-20 DIAGNOSIS — K529 Noninfective gastroenteritis and colitis, unspecified: Secondary | ICD-10-CM | POA: Diagnosis not present

## 2021-12-20 MED ORDER — ONDANSETRON HCL 4 MG PO TABS: 4.0000 mg | ORAL_TABLET | Freq: Three times a day (TID) | ORAL | 0 refills | Status: DC | PRN

## 2021-12-20 NOTE — Progress Notes (Signed)
We are sorry that you are not feeling well.  Here is how we plan to help! ? ?Based on what you have shared with me it looks like you have Acute Infectious Diarrhea. ? ?Most cases of acute diarrhea are due to infections with virus and bacteria and are self-limited conditions lasting less than 14 days. ? ?For your symptoms continue to use Imodium per package directions  ? ?Antibiotics are not needed for most people with diarrhea. ? ?I'll prescribe Zofran 4 mg 1 tablet every 8 hours as needed for nausea and vomiting again in cause you are out.  ? ? ?HOME CARE ?We recommend changing your diet to help with your symptoms for the next few days. ?Drink plenty of fluids that contain water salt and sugar. Sports drinks such as Gatorade may help.  ?You may try broths, soups, bananas, applesauce, soft breads, mashed potatoes or crackers.  ?You are considered infectious for as long as the diarrhea continues. Hand washing or use of alcohol based hand sanitizers is recommend. ?It is best to stay out of work or school until your symptoms stop.  ? ?GET HELP RIGHT AWAY ?If you have dark yellow colored urine or do not pass urine frequently you should drink more fluids.   ?If your symptoms worsen  ?If you feel like you are going to pass out (faint) ?You have a new problem ? ?MAKE SURE YOU  ?Understand these instructions. ?Will watch your condition. ?Will get help right away if you are not doing well or get worse. ? ?Thank you for choosing an e-visit. ? ?Your e-visit answers were reviewed by a board certified advanced clinical practitioner to complete your personal care plan. Depending upon the condition, your plan could have included both over the counter or prescription medications. ? ?Please review your pharmacy choice. Make sure the pharmacy is open so you can pick up prescription now. If there is a problem, you may contact your provider through [employer] and have the prescription routed to another pharmacy.  Your safety is  important to [name]. If you have drug allergies check your prescription carefully.  ? ?For the next 24 hours you can use MyChart to ask questions about today's visit, request a non-urgent call back, or ask for a work or school excuse. ?You will get an email in the next two days asking about your experience. I hope that your e-visit has been valuable and will speed your recovery.  ? ?I have spent 5 minutes in review of e-visit questionnaire, review and updating patient chart, medical decision making and response to patient.  ? ?Willeen Cass, PhD, FNP-BC ? ? ?

## 2021-12-25 ENCOUNTER — Other Ambulatory Visit (HOSPITAL_COMMUNITY): Payer: Self-pay

## 2022-01-14 ENCOUNTER — Other Ambulatory Visit (HOSPITAL_COMMUNITY): Payer: Self-pay

## 2022-01-14 ENCOUNTER — Ambulatory Visit (INDEPENDENT_AMBULATORY_CARE_PROVIDER_SITE_OTHER): Payer: No Typology Code available for payment source | Admitting: Bariatrics

## 2022-01-14 ENCOUNTER — Other Ambulatory Visit: Payer: Self-pay | Admitting: Family Medicine

## 2022-01-14 ENCOUNTER — Encounter (INDEPENDENT_AMBULATORY_CARE_PROVIDER_SITE_OTHER): Payer: Self-pay | Admitting: Bariatrics

## 2022-01-14 VITALS — BP 123/76 | HR 74 | Temp 97.7°F | Ht 63.0 in | Wt 234.0 lb

## 2022-01-14 DIAGNOSIS — E669 Obesity, unspecified: Secondary | ICD-10-CM | POA: Diagnosis not present

## 2022-01-14 DIAGNOSIS — Z6841 Body Mass Index (BMI) 40.0 and over, adult: Secondary | ICD-10-CM | POA: Diagnosis not present

## 2022-01-14 DIAGNOSIS — Z7985 Long-term (current) use of injectable non-insulin antidiabetic drugs: Secondary | ICD-10-CM

## 2022-01-14 DIAGNOSIS — I10 Essential (primary) hypertension: Secondary | ICD-10-CM

## 2022-01-14 DIAGNOSIS — E119 Type 2 diabetes mellitus without complications: Secondary | ICD-10-CM

## 2022-01-14 DIAGNOSIS — E1169 Type 2 diabetes mellitus with other specified complication: Secondary | ICD-10-CM | POA: Diagnosis not present

## 2022-01-14 MED ORDER — SEMAGLUTIDE (2 MG/DOSE) 8 MG/3ML ~~LOC~~ SOPN
2.0000 mg | PEN_INJECTOR | SUBCUTANEOUS | 0 refills | Status: DC
  Filled 2022-01-14: qty 3, 28d supply, fill #0

## 2022-01-14 NOTE — Progress Notes (Signed)
? ? ? ?Chief Complaint:  ? ?OBESITY ?Cindy Carey is here to discuss her progress with her obesity treatment plan along with follow-up of her obesity related diagnoses. Cindy Carey is on the Category 1 Plan and states she is following her eating plan approximately 75% of the time. Cindy Carey states she is walking for 30 minutes 7 times per week. ? ?Today's visit was #: 97 ?Starting weight: 251 lbs ?Starting date: 05/20/2021 ?Today's weight: 234 lbs ?Today's date: 01/14/2022 ?Total lbs lost to date: 17 lbs ?Total lbs lost since last in-office visit: 1 lb ? ?Interim History: Cindy Carey is down 1 additional pound. She is hungry all the time. She is drinking adequate water.  ? ?Subjective:  ? ?1. Type 2 diabetes mellitus with other specified complication, without long-term current use of insulin ([place]) ?Cindy Carey is currently taking Jardiance and Ozempic.  ? ?2. Essential hypertension ?Cindy Carey blood pressure is controlled. Her last blood pressure was 135/76. ? ?Assessment/Plan:  ? ?1. Type 2 diabetes mellitus with other specified complication, without long-term current use of insulin ([place]) ?We will refill Ozempic 2 mg for 1 month with no refills. She will take Apple Cider Vinegar 2 tsp before every meal. Good blood sugar control is important to decrease the likelihood of diabetic complications such as nephropathy, neuropathy, limb loss, blindness, coronary artery disease, and death. Intensive lifestyle modification including diet, exercise and weight loss are the first line of treatment for diabetes.  ? ?- Semaglutide, 2 MG/DOSE, 8 MG/3ML SOPN; Inject 2 mg as directed once a week.  Dispense: 3 mL; Refill: 0 ? ?2. Essential hypertension ?Cindy Carey will continue her medications. She is working on healthy weight loss and exercise to improve blood pressure control. We will watch for signs of hypotension as she continues her lifestyle modifications. ? ?3. Obesity, current BMI 41.6 ?Cindy Carey is currently in the action stage of change. As such, her goal is to continue  with weight loss efforts. She has agreed to the Category 2 Plan and following a lower carbohydrate, vegetable and lean protein rich diet plan.  ? ?Cindy Carey will adhere closely to plan. She will increase raw vegetables.  ? ?Exercise goals:  As is. ? ?Behavioral modification strategies: increasing lean protein intake, decreasing simple carbohydrates, increasing vegetables, increasing water intake, decreasing eating out, no skipping meals, meal planning and cooking strategies, keeping healthy foods in the home, and planning for success. ? ?Cindy Carey has agreed to follow-up with our clinic in 4 weeks. She was informed of the importance of frequent follow-up visits to maximize her success with intensive lifestyle modifications for her multiple health conditions.  ? ?Objective:  ? ?Blood pressure 123/76, pulse 74, temperature 97.7 ?F (36.5 ?C), height '5\' 3"'$  (1.6 m), weight 234 lb (106.1 kg), last menstrual period 10/18/1982, SpO2 95 %. ?Body mass index is 41.45 kg/m?. ? ?General: Cooperative, alert, well developed, in no acute distress. ?HEENT: Conjunctivae and lids unremarkable. ?Cardiovascular: Regular rhythm.  ?Lungs: Normal work of breathing. ?Neurologic: No focal deficits.  ? ?Lab Results  ?Component Value Date  ? CREATININE 0.75 12/14/2021  ? BUN 10 12/14/2021  ? NA 140 12/14/2021  ? K 4.4 12/14/2021  ? CL 105 12/14/2021  ? CO2 22 12/14/2021  ? ?Lab Results  ?Component Value Date  ? ALT 25 12/14/2021  ? AST 32 12/14/2021  ? ALKPHOS 140 (H) 12/14/2021  ? BILITOT 0.4 12/14/2021  ? ?Lab Results  ?Component Value Date  ? HGBA1C 5.8 (H) 12/14/2021  ? HGBA1C 6.4 (A) 06/08/2021  ? HGBA1C 7.8 (  A) 03/09/2021  ? HGBA1C 7.0 (A) 07/09/2020  ? HGBA1C 6.5 (A) 12/20/2019  ? ?Lab Results  ?Component Value Date  ? INSULIN 76.3 (H) 12/14/2021  ? INSULIN 98.4 (H) 05/20/2021  ? INSULIN 40.3 (H) 03/23/2019  ? INSULIN 59.1 (H) 07/13/2018  ? ?Lab Results  ?Component Value Date  ? TSH 1.540 03/09/2021  ? ?Lab Results  ?Component Value Date  ? CHOL  137 12/14/2021  ? HDL 70 12/14/2021  ? LDLCALC 49 12/14/2021  ? TRIG 100 12/14/2021  ? CHOLHDL 1.9 03/09/2021  ? ?Lab Results  ?Component Value Date  ? VD25OH 41.4 03/23/2019  ? VD25OH 48.7 12/14/2018  ? VD25OH 50.0 07/13/2018  ? ?Lab Results  ?Component Value Date  ? WBC 6.5 03/09/2021  ? HGB 11.6 03/09/2021  ? HCT 37.5 03/09/2021  ? MCV 85 03/09/2021  ? PLT 86 (LL) 03/09/2021  ? ?Lab Results  ?Component Value Date  ? IRON 84 12/07/2017  ? ?Attestation Statements:  ? ?Reviewed by clinician on day of visit: allergies, medications, problem list, medical history, surgical history, family history, social history, and previous encounter notes. ? ? ?I, Lizbeth Bark, RMA, am acting as transcriptionist for [employer], DO. ? ?I have reviewed the above documentation for accuracy and completeness, and I agree with the above. Jearld Lesch, DO ? ?

## 2022-01-15 ENCOUNTER — Encounter (INDEPENDENT_AMBULATORY_CARE_PROVIDER_SITE_OTHER): Payer: Self-pay | Admitting: Bariatrics

## 2022-01-15 ENCOUNTER — Other Ambulatory Visit (HOSPITAL_COMMUNITY): Payer: Self-pay

## 2022-01-15 MED ORDER — METFORMIN HCL ER 500 MG PO TB24
1000.0000 mg | ORAL_TABLET | Freq: Two times a day (BID) | ORAL | 0 refills | Status: DC
  Filled 2022-01-15: qty 360, 90d supply, fill #0

## 2022-02-10 NOTE — Progress Notes (Signed)
66 y.o. G0P0000 Married White or Caucasian Not Hispanic or Latino female here for annual exam.   ?H/O TAH/BSO at 35 for severe endometriosis. Not sexually active, ED and vaginal pain. Married x 36 years.  ?   ?H/O DM, last HgbA1C was 5.8% ?  ? ?Patient's last menstrual period was 10/18/1982.          ?Sexually active: No.  ?The current method of family planning is post menopausal status.    ?Exercising: Yes.     Walking the dog  ?Smoker:  no ? ?Health Maintenance: ?Pap:  2011  ?History of abnormal Pap:  no ?MMG:  09/03/21 density B Bi-rads 1 neg  ?BMD:    09/30/2017 Osteopenia T score of -1.3, FRAX 12.7/0.4% ?Colonoscopy: 02/20/2018 polyp, f/u 10 years  ?TDaP:  2016  ?Gardasil: n/a ? ? reports that she has never smoked. She has never used smokeless tobacco. She reports that she does not drink alcohol and does not use drugs. ? ?Past Medical History:  ?Diagnosis Date  ? Anemia   ? Asthma   ? Atypical ductal hyperplasia of left breast   ? Back pain   ? Blood transfusion without reported diagnosis 1983  ? CAD (coronary artery disease)   ? Diabetes mellitus without complication ([place]) 5277  ? T2DM  ? Dyspareunia   ? Exercise-induced asthma   ? worse in winter  ? Food allergy   ? H/O bone density study 2013  ? H/O cold sores   ? H/O colonoscopy 2009  ? History of endometriosis   ? Hyperlipidemia   ? Hypertension   ? Injury of tendon of rotator cuff 2015  ? gym injury (right)  ? Joint pain   ? Knee pain 08/2015  ? L>R  ? Migraine with typical aura   ? Mild CAD 04/04/2018  ? Mild disease on coronary CT-A 01/2018.  ? Morbid obesity ([place]) 09/04/2012  ? BMI 49.4 kg/m^2   ? Osteoarthritis   ? Plantar fasciitis, left 2019  ? Trigger finger of left thumb 08/2018  ? injected by Dr. Fredna Dow 09/18/18  ? ? ?Past Surgical History:  ?Procedure Laterality Date  ? BREAST BIOPSY  2010  ? BREAST LUMPECTOMY  2010  ? Benign  ? BREAST LUMPECTOMY WITH RADIOACTIVE SEED LOCALIZATION Left 07/23/2015  ? Procedure: LEFT BREAST SEED GUIDED EXCISION;   Surgeon: Rolm Bookbinder, MD;  Location: [facility];  Service: General;  Laterality: Left;  ? BREATH TEK H PYLORI  08/28/2012  ? Procedure: BREATH TEK H PYLORI;  Surgeon: Pedro Earls, MD;  Location: [facility];  Service: General;  Laterality: N/A;  ? CHOLECYSTECTOMY  2007  ? EXPLORATORY LAPAROTOMY  1983  ? endometriosis  ? GANGLION CYST EXCISION Left 1999  ? Foot  ? TOTAL ABDOMINAL HYSTERECTOMY W/ BILATERAL SALPINGOOPHORECTOMY  1984  ? endometriosis (and appendectomy)  ? ? ?Current Outpatient Medications  ?Medication Sig Dispense Refill  ? aspirin EC 81 MG tablet Take 81 mg by mouth daily.    ? Calcium Carbonate-Vit D-Min (CALCIUM 600+D PLUS MINERALS) 600-400 MG-UNIT TABS Take 1 tablet by mouth 2 (two) times daily.    ? Cholecalciferol (VITAMIN D3) 125 MCG (5000 UT) CAPS Take 1 capsule by mouth daily.    ? cyclobenzaprine (FLEXERIL) 10 MG tablet Take 1 tablet (10 mg total) by mouth 3 (three) times daily as needed for muscle spasms. 30 tablet 0  ? doxycycline (VIBRAMYCIN) 100 MG capsule Take 1 capsule (100 mg total) by mouth daily. [place]  capsule 5  ? empagliflozin (JARDIANCE) 25 MG TABS tablet Take 1 tablet (25 mg total) by mouth daily with breakfast. 90 tablet 1  ? EPINEPHrine 0.3 mg/0.3 mL IJ SOAJ injection Inject 0.3 mg into the muscle once as needed for anaphylaxis. 2 each 0  ? famotidine (PEPCID) 20 MG tablet Take 20 mg by mouth at bedtime.    ? glucose blood test strip Test  2 (two) times daily. 100 each 2  ? glucose monitoring kit (FREESTYLE) monitoring kit 1 each by Does not apply route as needed for other. 1 each 0  ? Lancets (FREESTYLE) lancets 1 each by Other route 2 (two) times daily. Test twice daily Dx:E11.9 100 each 2  ? lisinopril (ZESTRIL) 10 MG tablet Take 1 tablet (10 mg total) by mouth daily. 90 tablet 3  ? Magnesium 250 MG TABS Take 250 mg by mouth daily.    ? metFORMIN (GLUCOPHAGE-XR) 500 MG 24 hr tablet Take 2 tablets (1,000 mg total) by mouth 2 (two) times daily. 360  tablet 0  ? metroNIDAZOLE (METROGEL) 0.75 % gel APPLY A SMALL AMOUNT TO AFFECTED AREA ON FACE TWICE DAILY 45 g 2  ? ondansetron (ZOFRAN) 4 MG tablet Take 1 tablet (4 mg total) by mouth every 8 (eight) hours as needed for nausea or vomiting. 20 tablet 0  ? promethazine (PHENERGAN) 25 MG tablet Take 1 tablet (25 mg total) by mouth every 8 (eight) hours as needed for nausea or vomiting. 30 tablet 0  ? Semaglutide, 2 MG/DOSE, 8 MG/3ML SOPN Inject 2 mg as directed once a week. 3 mL 0  ? simvastatin (ZOCOR) 40 MG tablet Take 1 tablet (40 mg total) by mouth every evening. 90 tablet 1  ? ?No current facility-administered medications for this visit.  ? ? ?Family History  ?Problem Relation Age of Onset  ? Diabetes Father   ? Heart attack Father 60  ? Heart disease Father   ? Hypertension Father   ? Peripheral Artery Disease Father   ?     stents at 56  ? Diabetes Paternal Grandfather   ? Heart disease Paternal Grandfather   ? Osteoporosis Maternal Grandmother   ? Heart failure Maternal Grandmother   ? Thyroid disease Mother   ?     hypothyroidism  ? Heart disease Mother 3  ?     CABG at 21  ? Hypertension Mother   ? Diverticulitis Mother 57  ?     perforation, required surgery  ? Pneumonia Sister   ? Cancer Maternal Grandfather   ?     lung  ? Dementia Paternal Grandmother   ? Cancer Paternal Grandmother   ?     melanoma on her foot (not related to death)  ? Breast cancer Paternal Aunt   ?     52's  ? Cancer Maternal Aunt   ?     lung  ? Cancer Maternal Uncle   ?     lung  ? Breast cancer Cousin   ?     mets to lung  ? Congestive Heart Failure Maternal Uncle   ? Breast cancer Cousin   ? ? ?Review of Systems  ?All other systems reviewed and are negative. ? ?Exam:   ?LMP 10/18/1982   Weight change: @WEIGHTCHANGE @ Height:      ?Ht Readings from Last 3 Encounters:  ?01/14/22 5' 3"  (1.6 m)  ?12/14/21 5' 3"  (1.6 m)  ?11/16/21 5' 3"  (1.6 m)  ? ? ?General appearance: alert,  cooperative and appears stated age ?Head: Normocephalic,  without obvious abnormality, atraumatic ?Neck: no adenopathy, supple, symmetrical, trachea midline and thyroid normal to inspection and palpation ?Lungs: clear to auscultation bilaterally ?Cardiovascular: regular rate and rhythm ?Breasts: normal appearance, no masses or tenderness ?Abdomen: soft, non-tender; non distended,  no masses,  no organomegaly ?Extremities: extremities normal, atraumatic, no cyanosis or edema ?Skin: Skin color, texture, turgor normal. No rashes or lesions ?Lymph nodes: Cervical, supraclavicular, and axillary nodes normal. ?No abnormal inguinal nodes palpated ?Neurologic: Grossly normal ? ? ?Pelvic: External genitalia:  no lesions ?             Urethra:  normal appearing urethra with no masses, tenderness or lesions ?             Bartholins and Skenes: normal    ?             Vagina: normal appearing vagina with normal color and discharge, no lesions ?             Cervix: absent ?              ?Bimanual Exam:  Uterus:  uterus absent ?             Adnexa: no mass, fullness, tenderness ?              Rectovaginal: Confirms ?              Anus:  normal sphincter tone, no lesions ? ?Karmen Bongo, RN chaperoned for the exam. ? ?1. Well woman exam ?Discussed breast self exam ?Discussed calcium and vit D intake ?No pap needed ?Mammogram in 11/23 ?Colonoscopy and DEXA are UTD ?Labs with primary ? ? ?

## 2022-02-11 ENCOUNTER — Other Ambulatory Visit (HOSPITAL_COMMUNITY): Payer: Self-pay

## 2022-02-11 ENCOUNTER — Ambulatory Visit (INDEPENDENT_AMBULATORY_CARE_PROVIDER_SITE_OTHER): Payer: No Typology Code available for payment source | Admitting: Physician Assistant

## 2022-02-11 ENCOUNTER — Encounter (INDEPENDENT_AMBULATORY_CARE_PROVIDER_SITE_OTHER): Payer: Self-pay | Admitting: Bariatrics

## 2022-02-11 ENCOUNTER — Encounter (INDEPENDENT_AMBULATORY_CARE_PROVIDER_SITE_OTHER): Payer: Self-pay

## 2022-02-11 ENCOUNTER — Other Ambulatory Visit (INDEPENDENT_AMBULATORY_CARE_PROVIDER_SITE_OTHER): Payer: Self-pay | Admitting: Bariatrics

## 2022-02-11 DIAGNOSIS — E1169 Type 2 diabetes mellitus with other specified complication: Secondary | ICD-10-CM

## 2022-02-11 MED ORDER — SEMAGLUTIDE (2 MG/DOSE) 8 MG/3ML ~~LOC~~ SOPN
2.0000 mg | PEN_INJECTOR | SUBCUTANEOUS | 0 refills | Status: DC
  Filled 2022-02-11: qty 3, 28d supply, fill #0

## 2022-02-11 NOTE — Telephone Encounter (Signed)
Please review

## 2022-02-11 NOTE — Telephone Encounter (Signed)
Dr.Brown 

## 2022-02-12 ENCOUNTER — Other Ambulatory Visit (HOSPITAL_COMMUNITY): Payer: Self-pay

## 2022-02-15 ENCOUNTER — Telehealth (INDEPENDENT_AMBULATORY_CARE_PROVIDER_SITE_OTHER): Payer: Self-pay | Admitting: Family Medicine

## 2022-02-15 ENCOUNTER — Encounter (INDEPENDENT_AMBULATORY_CARE_PROVIDER_SITE_OTHER): Payer: Self-pay | Admitting: Family Medicine

## 2022-02-15 ENCOUNTER — Other Ambulatory Visit (HOSPITAL_COMMUNITY): Payer: Self-pay

## 2022-02-15 ENCOUNTER — Telehealth (INDEPENDENT_AMBULATORY_CARE_PROVIDER_SITE_OTHER): Payer: No Typology Code available for payment source | Admitting: Family Medicine

## 2022-02-15 DIAGNOSIS — E669 Obesity, unspecified: Secondary | ICD-10-CM | POA: Diagnosis not present

## 2022-02-15 DIAGNOSIS — E785 Hyperlipidemia, unspecified: Secondary | ICD-10-CM

## 2022-02-15 DIAGNOSIS — Z7984 Long term (current) use of oral hypoglycemic drugs: Secondary | ICD-10-CM

## 2022-02-15 DIAGNOSIS — E1169 Type 2 diabetes mellitus with other specified complication: Secondary | ICD-10-CM

## 2022-02-15 DIAGNOSIS — Z6841 Body Mass Index (BMI) 40.0 and over, adult: Secondary | ICD-10-CM

## 2022-02-15 MED ORDER — TIRZEPATIDE 10 MG/0.5ML ~~LOC~~ SOAJ
10.0000 mg | SUBCUTANEOUS | 0 refills | Status: DC
  Filled 2022-02-15: qty 2, 28d supply, fill #0

## 2022-02-15 NOTE — Telephone Encounter (Addendum)
Prior authorization in covermymeds for Cindy Carey is stating: Member should be able to get the drug/product without a PA at this time. I am seeing this message with several patients in covermymeds and this information is not correct, due to the changes effective April 1st. This information is not correct. I have called by phone and started prior authorization for [facility]. They stated it can take up to 72 hours if no additional information is required. Our office will receive a fax with the decision, it will not be in the covermymeds portal. I will notify you and patient once a response is received. Reference [id number] for the call.  ?

## 2022-02-15 NOTE — Progress Notes (Signed)
?TeleHealth Visit:  ?This visit was completed with telemedicine (audio/video) technology. ?[name] has verbally consented to this TeleHealth visit. The patient is located at home, the provider is located at home. The participants in this visit include the listed provider and patient. The visit was conducted today via MyChart video. ? ?OBESITY ?Cindy Carey is here to discuss her progress with her obesity treatment plan along with follow-up of her obesity related diagnoses.  ? ?Today's visit was # 12 ?Starting weight: 251 lbs ?Starting date: 05/20/21 ?Weight at last in office visit: 234 lbs on 01/14/22 ?Total weight loss: 17 lbs at last in office visit on 01/14/22. ?Today's reported weight: 235 lbs  ? ? ?Nutrition Plan: the Category 2 Plan OR following a lower carbohydrate, vegetable and lean protein rich diet plan.  ?Hunger is poorly controlled. Cravings are well controlled.  ?Current exercise: walks 30 minutes 5 days per week.  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 ? ?Interim History: Cindy Carey is mostly focusing on protein and making healthy choices rather than following category 2 or low-carb plan.  She has made good progress since August 2022 when she restarted the program and she is down 17 pounds. ?However she notes she is having hunger most of the time.  She does not notice any appetite suppression from Ozempic 2 mg which she is on for her diabetes. ?Breakfast-cottage cheese and fruit and then may be some oatmeal later in the morning. ?Lunch-she packs her lunch.  Today she is having tuna salad and potatoes. ?Dinner-varies.   ?Water intake is good-around 80 ounces per day. ?Assessment/Plan:  ?1. Type II Diabetes ?HgbA1c is at goal. ?CBGs: Before meals-usually 90-100s ?Any episodes of hypoglycemia? yes - about twice a month she has CBGs in the 60s. ?Medication(s): Metformin XR 1000 mg twice daily, Ozempic 2 mg weekly, Jardiance 25 mg daily.  No appetite suppression from Ozempic.   Denies constipation. ? ?Lab Results  ?Component Value Date  ? HGBA1C 5.8 (H) 12/14/2021  ? HGBA1C 6.4 (A) 06/08/2021  ? HGBA1C 7.8 (A) 03/09/2021  ? ?Lab Results  ?Component Value Date  ? MICROALBUR 0.3 03/31/2016  ? LDLCALC 49 12/14/2021  ? CREATININE 0.75 12/14/2021  ? ? ?Plan: ?Continue metformin and Jardiance at current doses. ?New prescription: Mounjaro 10 mg weekly. ?Hopefully this will help more with appetite suppression. ? ?2. Hyperlipidemia ?LDL is at goal. ?Medication(s): Simvastatin 40 mg daily. ?Cardiovascular risk factors: diabetes mellitus, dyslipidemia, hypertension, obesity (BMI >= 30 kg/m2), and sedentary lifestyle ? ?Lab Results  ?Component Value Date  ? CHOL 137 12/14/2021  ? HDL 70 12/14/2021  ? LDLCALC 49 12/14/2021  ? TRIG 100 12/14/2021  ? CHOLHDL 1.9 03/09/2021  ? ?Lab Results  ?Component Value Date  ? ALT 25 12/14/2021  ? AST 32 12/14/2021  ? ALKPHOS 140 (H) 12/14/2021  ? BILITOT 0.4 12/14/2021  ? ?The 10-year ASCVD risk score (Arnett DK, et al., 2019) is: 8.5% ?  Values used to calculate the score: ?    Age: 33 years ?    Sex: Female ?    Is Non-Hispanic African American: No ?    Diabetic: Yes ?    Tobacco smoker: No ?    Systolic Blood Pressure: 269 mmHg ?    Is BP treated: Yes ?    HDL Cholesterol: 70 mg/dL ?    Total Cholesterol: 137 mg/dL ? ?Plan: ?Continue statin. ? ?3. Obesity: Current BMI 41.46 ?[name] is currently in the action stage  of change. As such, her goal is to continue with weight loss efforts.  ?She has agreed to the Category 2 Plan OR practicing portion control and making smarter food choices, such as increasing vegetables and decreasing simple carbohydrates.  ? ?Exercise goals:  Continue current regimen ? ?Behavioral modification strategies: increasing lean protein intake and decreasing simple carbohydrates. ? ?Herta has agreed to follow-up with our clinic in 3 weeks.  ? ?No orders of the defined types were placed in this encounter. ? ? ?Medications Discontinued During This  Encounter  ?Medication Reason  ? Semaglutide, 2 MG/DOSE, 8 MG/3ML SOPN Change in therapy  ?  ? ?Meds ordered this encounter  ?Medications  ? tirzepatide [facility]) 10 MG/0.5ML Pen  ?  Sig: Inject 10 mg into the skin once a week.  ?  Dispense:  2 mL  ?  Refill:  0  ?  Order Specific Question:   Supervising Provider  ?  Answer:   Dennard Nip D [[id number]]  ?   ? ?Objective:  ? ?VITALS: Per patient if applicable, see vitals. ?GENERAL: Alert and in no acute distress. ?CARDIOPULMONARY: No increased WOB. Speaking in clear sentences.  ?PSYCH: Pleasant and cooperative. Speech normal rate and rhythm. Affect is appropriate. Insight and judgement are appropriate. Attention is focused, linear, and appropriate.  ?NEURO: Oriented as arrived to appointment on time with no prompting.  ? ?Lab Results  ?Component Value Date  ? CREATININE 0.75 12/14/2021  ? BUN 10 12/14/2021  ? NA 140 12/14/2021  ? K 4.4 12/14/2021  ? CL 105 12/14/2021  ? CO2 22 12/14/2021  ? ?Lab Results  ?Component Value Date  ? ALT 25 12/14/2021  ? AST 32 12/14/2021  ? ALKPHOS 140 (H) 12/14/2021  ? BILITOT 0.4 12/14/2021  ? ?Lab Results  ?Component Value Date  ? HGBA1C 5.8 (H) 12/14/2021  ? HGBA1C 6.4 (A) 06/08/2021  ? HGBA1C 7.8 (A) 03/09/2021  ? HGBA1C 7.0 (A) 07/09/2020  ? HGBA1C 6.5 (A) 12/20/2019  ? ?Lab Results  ?Component Value Date  ? INSULIN 76.3 (H) 12/14/2021  ? INSULIN 98.4 (H) 05/20/2021  ? INSULIN 40.3 (H) 03/23/2019  ? INSULIN 59.1 (H) 07/13/2018  ? ?Lab Results  ?Component Value Date  ? TSH 1.540 03/09/2021  ? ?Lab Results  ?Component Value Date  ? CHOL 137 12/14/2021  ? HDL 70 12/14/2021  ? LDLCALC 49 12/14/2021  ? TRIG 100 12/14/2021  ? CHOLHDL 1.9 03/09/2021  ? ?Lab Results  ?Component Value Date  ? WBC 6.5 03/09/2021  ? HGB 11.6 03/09/2021  ? HCT 37.5 03/09/2021  ? MCV 85 03/09/2021  ? PLT 86 (LL) 03/09/2021  ? ?Lab Results  ?Component Value Date  ? IRON 84 12/07/2017  ? ?Lab Results  ?Component Value Date  ? VD25OH 41.4 03/23/2019  ? VD25OH  48.7 12/14/2018  ? VD25OH 50.0 07/13/2018  ? ? ?Attestation Statements:  ? ?Reviewed by clinician on day of visit: allergies, medications, problem list, medical history, surgical history, family history, social history, and previous encounter notes. ? ? ?

## 2022-02-15 NOTE — Telephone Encounter (Signed)
FYI

## 2022-02-16 ENCOUNTER — Other Ambulatory Visit (HOSPITAL_COMMUNITY): Payer: Self-pay

## 2022-02-17 ENCOUNTER — Telehealth (INDEPENDENT_AMBULATORY_CARE_PROVIDER_SITE_OTHER): Payer: Self-pay | Admitting: Family Medicine

## 2022-02-17 ENCOUNTER — Encounter: Payer: Self-pay | Admitting: Obstetrics and Gynecology

## 2022-02-17 ENCOUNTER — Ambulatory Visit (INDEPENDENT_AMBULATORY_CARE_PROVIDER_SITE_OTHER): Payer: No Typology Code available for payment source | Admitting: Obstetrics and Gynecology

## 2022-02-17 ENCOUNTER — Encounter (INDEPENDENT_AMBULATORY_CARE_PROVIDER_SITE_OTHER): Payer: Self-pay

## 2022-02-17 VITALS — BP 110/72 | HR 77 | Ht 63.0 in | Wt 238.0 lb

## 2022-02-17 DIAGNOSIS — Z01419 Encounter for gynecological examination (general) (routine) without abnormal findings: Secondary | ICD-10-CM | POA: Diagnosis not present

## 2022-02-17 NOTE — Patient Instructions (Signed)

## 2022-02-17 NOTE — Telephone Encounter (Signed)
[place] - Prior authorization approved for [employer]. Effective:02/16/2022- 02/16/2023. Patient sent approval message via mychart.  ?

## 2022-03-04 ENCOUNTER — Encounter: Payer: Self-pay | Admitting: Family Medicine

## 2022-03-08 NOTE — Progress Notes (Unsigned)
TeleHealth Visit:  This visit was completed with telemedicine (audio/video) technology. [name] has verbally consented to this TeleHealth visit. The patient is located at home, the provider is located at home. The participants in this visit include the listed provider and patient. The visit was conducted today via MyChart video.  OBESITY Cindy Carey is here to discuss her progress with her obesity treatment plan along with follow-up of her obesity related diagnoses.     Today's visit was # 13 Starting weight: 251 lbs Starting date: 05/20/21 Weight at last in office visit: 234 lbs on 01/14/22 Total weight loss: 17 lbs at last in office visit on 01/14/22. Today's reported weight: 234 lbs   Nutrition Plan: the Category 2 Plan and practicing portion control and making smarter food choices, such as increasing vegetables and decreasing simple carbohydrates.  Hunger is moderately controlled. Cravings are moderately controlled.  Current exercise: walks dog for 30 minutes 7 days per week  Interim History: Hunger better controlled since starting Mounjaro.  She has been having pain from a rib injury and having difficulty sleeping.  She feels she would have snacked a lot more during this time if not for the [employer]  Protein and water intake are good.  She denies any issues.  Food recall: Breakfast -oatmeal with cottage cheese Snack about 10 AM-nuts Lunch-sandwich or salad Dinner usually preps a few meals ahead of time.  Meat and vegetable.  She is considering joining [employer] after her rib injury heals. Assessment/Plan:  1. Type II Diabetes HgbA1c is at goal. Medication(s): Mounjaro 10 mg-changed from Ozempic 2 mg at last office visit.  No side effects and appetite suppression better. Metformin XR 1000 mg twice daily, Jardiance 25 mg.  Lab Results  Component Value Date   HGBA1C 5.8 (H) 12/14/2021   HGBA1C 6.4 (A) 06/08/2021   HGBA1C 7.8 (A) 03/09/2021   Lab Results  Component Value  Date   MICROALBUR 0.3 03/31/2016   LDLCALC 49 12/14/2021   CREATININE 0.75 12/14/2021    Plan: Continue metformin XR 1000 mg twice daily and Jardiance 25 mg daily Increase dose of Mounjaro.  Refill Mounjaro 12.5 mg weekly.   2. Hyperlipidemia associated with type 2 diabetes LDL is at goal. Medication(s): Simvastatin 40 mg daily. Cardiovascular risk factors: diabetes mellitus, dyslipidemia, and obesity (BMI >= 30 kg/m2)  Lab Results  Component Value Date   CHOL 137 12/14/2021   HDL 70 12/14/2021   LDLCALC 49 12/14/2021   TRIG 100 12/14/2021   CHOLHDL 1.9 03/09/2021   Lab Results  Component Value Date   ALT 25 12/14/2021   AST 32 12/14/2021   ALKPHOS 140 (H) 12/14/2021   BILITOT 0.4 12/14/2021   The 10-year ASCVD risk score (Arnett DK, et al., 2019) is: 6.8%   Values used to calculate the score:     Age: 66 years     Sex: Female     Is Non-Hispanic African American: No     Diabetic: Yes     Tobacco smoker: No     Systolic Blood Pressure: 485 mmHg     Is BP treated: Yes     HDL Cholesterol: 70 mg/dL     Total Cholesterol: 137 mg/dL  Plan: Continue statin.  3. Obesity: Current BMI 41.46 [name] is currently in the action stage of change. As such, her goal is to continue with weight loss efforts.  She has agreed to the Category 2 Plan  OR practicing portion control and making smarter food choices, such as  increasing vegetables and decreasing simple carbohydrates.   Exercise goals: Continue current regimen.  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  Discussed using workout express at PF.  Behavioral modification strategies: increasing lean protein intake and decreasing simple carbohydrates.  Gwenetta has agreed to follow-up with our clinic in 2 weeks.   No orders of the defined types were placed in this encounter.   There are no discontinued medications.   No orders of the defined types were placed in this encounter.      Objective:   VITALS: Per patient if applicable, see vitals. GENERAL: Alert and in no acute distress. CARDIOPULMONARY: No increased WOB. Speaking in clear sentences.  PSYCH: Pleasant and cooperative. Speech normal rate and rhythm. Affect is appropriate. Insight and judgement are appropriate. Attention is focused, linear, and appropriate.  NEURO: Oriented as arrived to appointment on time with no prompting.   Lab Results  Component Value Date   CREATININE 0.75 12/14/2021   BUN 10 12/14/2021   NA 140 12/14/2021   K 4.4 12/14/2021   CL 105 12/14/2021   CO2 22 12/14/2021   Lab Results  Component Value Date   ALT 25 12/14/2021   AST 32 12/14/2021   ALKPHOS 140 (H) 12/14/2021   BILITOT 0.4 12/14/2021   Lab Results  Component Value Date   HGBA1C 5.8 (H) 12/14/2021   HGBA1C 6.4 (A) 06/08/2021   HGBA1C 7.8 (A) 03/09/2021   HGBA1C 7.0 (A) 07/09/2020   HGBA1C 6.5 (A) 12/20/2019   Lab Results  Component Value Date   INSULIN 76.3 (H) 12/14/2021   INSULIN 98.4 (H) 05/20/2021   INSULIN 40.3 (H) 03/23/2019   INSULIN 59.1 (H) 07/13/2018   Lab Results  Component Value Date   TSH 1.540 03/09/2021   Lab Results  Component Value Date   CHOL 137 12/14/2021   HDL 70 12/14/2021   LDLCALC 49 12/14/2021   TRIG 100 12/14/2021   CHOLHDL 1.9 03/09/2021   Lab Results  Component Value Date   WBC 6.5 03/09/2021   HGB 11.6 03/09/2021   HCT 37.5 03/09/2021   MCV 85 03/09/2021   PLT 86 (LL) 03/09/2021   Lab Results  Component Value Date   IRON 84 12/07/2017   Lab Results  Component Value Date   VD25OH 41.4 03/23/2019   VD25OH 48.7 12/14/2018   VD25OH 50.0 07/13/2018    Attestation Statements:   Reviewed by clinician on day of visit: allergies, medications, problem list, medical history, surgical history, family history, social history, and previous encounter notes.

## 2022-03-08 NOTE — Progress Notes (Unsigned)
No chief complaint on file.  No chief complaint on file.   Cindy Carey is a 66 y.o. female who presents for a complete physical.  She has the following concerns:  Left rib pain:  2 weeks ago she hit her lower left chest on a wooden arm chair.  She has pain at the ribs, doesn't see any bruising/swelling.  It hurts to lay and sleep, she has been sleeping in a recliner. She is alternating Tylenol and Ibuprofen, and Voltaren gel topically, which helps during the day, but not at night.  She is asking about a stronger medication at night (suggesting meloxicam) to help with nighttime pain so she can sleep better.  GERD:  Heartburn had gotten worse last year, has hiatal hernia.  We had discussed using Pepcid 27m BID or Prilosec OTC (and risks/SE of both).  At her last visit with me she had reported taking Pepcid 223mat bedtime, needing much less Tums. Still has occasional heartburn, depends on what she eats  Hypertension follow-up:  Blood pressures are   Denies dizziness, headaches, chest pain.   Denies side effects of medications.   She has leg cramps intermittently at night, related to which shoes she wore all day.  She also gets leg cramps with long car rides if they don't stop. This is unchanged.   Migraines--She takes magnesium supplement, no longer on any other medications.   She uses ibuprofen at time of aura, phenergan if that doesn't work.  (Stopped topiramate in the past due to hair thinning. Improved after stopping, but improved much more after completing tamoxifen)   Hyperlipidemia follow-up:  Patient is reportedly following a low-fat, low cholesterol diet. Compliant with medications (simvastatin 4039mand denies medication side effects. Lipids were at goal on last check: Lab Results  Component Value Date   CHOL 137 12/14/2021   HDL 70 12/14/2021   LDLCALC 49 12/14/2021   TRIG 100 12/14/2021   CHOLHDL 1.9 03/09/2021   Diabetes:  [facility]   She had been on Ozempic 2mg13mor her diabetes, but hadn't noticed any appetite suppression. Switched?  SE? Sugars are running  Denies hypoglycemia Last eye exam: 10/2021, no retinopathy Checks feet regularly without concerns. Lab Results  Component Value Date   HGBA1C 5.8 (H) 12/14/2021     STOPPED HERE  Non-obstructive CAD. Calcium score of 1 in 01/2018. She last saw Dr. RandOval Linsey10/2020 and no changes were made. She had CT calcium score repeated 02/27/21 (part of Doctor's Day):  Coronary calcium score of 36. This was 74 p11centile for age-, race-, and sex-matched controls. Aortic arch atherosclerosis noted, small focal area. She is compliant with ASA 81mg62mly and statin.  Left breast intraductal papilloma with atypical ductal hyperplasia diagnosed 07/15/2015; status post lumpectomy 07/23/2015. Due to lifetime risk of breast cancer of 25%, she was treated with risk lowering therapy with Tamoxifen for 5 years, completing in 08/2020.  She last saw Dr. GudenLindi Adie in 02/2018. Last mammogram as 08/2020. Hair has improved significantly.   Obesity:  She was deemed ineligible for bariatric surgery by her insurance.  She has tried multiple weight loss medications, and was not successful in losing weight with [facility] (wasn't as helpful virtually).   At her last visit, had been under a lot of stress (related to work (call), loss of mother-in-law, among other things), and reported that her diet hadn't been great x 3-6 months. Currently she reports still  being stressed--taking outpatient call (no longer doing inpatient), 2-3 weeks/month.  Has a meeting scheduled this week to discuss  Mild thrombocytopenia. She denies bleeding or bruising (other than minor bruising at Trulicity injection sites). Due for recheck. Lab Results  Component Value Date   WBC 6.5 03/09/2021   HGB 11.6 03/09/2021   HCT 37.5 03/09/2021   MCV 85 03/09/2021   PLT 86 (LL) 03/09/2021      She uses flexeril before bedtime when muscles are very tight, or if needed for R shoulder pain (1/2 tablet once a week), or else she won't sleep well.  She uses Tylenol before bedtime for other pains (hip, knee).   Immunization History  Administered Date(s) Administered   Influenza,inj,Quad PF,6+ Mos 07/09/2020   Influenza-Unspecified 06/27/2015, 07/03/2017, 07/05/2018, 07/19/2019, 07/20/2021   PFIZER Comirnaty(Gray Top)Covid-19 Tri-Sucrose Vaccine 03/09/2021   PFIZER(Purple Top)SARS-COV-2 Vaccination 10/20/2019, 11/10/2019, 07/09/2020   Pfizer Covid-19 Vaccine Bivalent Booster 86yr & up 12/15/2021   Pneumococcal Polysaccharide-23 03/31/2016   Tdap 07/03/2015   Zoster Recombinat (Shingrix) 12/07/2017, 02/10/2018   Last Pap smear:  per GYN, s/p hysterectomy. Last saw Dr. JTalbert Nan4/2022 Last mammogram: 08/2020 Last colonoscopy: 02/2018, hyperplastic polyp Dr. MCollene MaresLast DEXA: 09/2017 T-1.3 at L fem neck Dentist: twice yearly Ophtho: yearly Exercise: walks 30 minutes daily with the dog, but very slowly. No weight-bearing exercise.   Vitamin D-OH 41.4 in 03/2019   PMH, PSH, SH and FH were reviewed and updated   ROS:  The patient denies anorexia, fever, vision changes, decreased hearing, ear pain, sore throat, breast concerns, chest pain, palpitations, dizziness, syncope, dyspnea on exertion, cough, swelling, vomiting, diarrhea, constipation, abdominal pain, melena, hematochezia, hematuria, incontinence (occ with sneeze and lifting toddlers), dysuria, vaginal bleeding, discharge, odor, genital lesions, numbness, tingling, weakness, tremor, suspicious skin lesions, depression, anxiety, abnormal bleeding/bruising, or enlarged lymph nodes. R>L knee pain, depends on the shoes she wears. Worse with stairs/hills, not a problem when walking dog. Migraines are infrequent. +reflux per HPI, frequent morning nausea    PHYSICAL EXAM:  LMP 10/18/1982   Wt Readings from Last 3 Encounters:   02/17/22 238 lb (108 kg)  01/14/22 234 lb (106.1 kg)  12/14/21 235 lb (106.6 kg)    General Appearance:    Alert, cooperative, no distress, appears stated age. She did not undress into gown for exam.  Head:    Normocephalic, without obvious abnormality, atraumatic  Eyes:    PERRL, conjunctiva/corneas clear, EOM's intact, fundi benign  Ears:    Normal TM's and external ear canals  Nose:   Not examined, wearing mask due to COVID-19 pandemic  Throat:   Not examined, wearing mask due to COVID-19 pandemic  Neck:   Supple, no lymphadenopathy;  thyroid:  no enlargement/tenderness/ nodules; no carotid bruit or JVD  Back:    Spine nontender, no curvature, ROM normal, no CVA tenderness  Lungs:     Clear to auscultation bilaterally without wheezes, rales or ronchi; respirations unlabored  Chest Wall:    No tenderness or deformity   Heart:    Regular rate and rhythm, S1 and S2 normal, no murmur, rub or gallop  Breast Exam:    Deferred to GYN  Abdomen:     Soft, non-tender, nondistended, normoactive bowel sounds, no masses, no hepatosplenomegaly  Genitalia:    Deferred to GYN       Extremities:   No clubbing, cyanosis or edema. Normal diabetic foot exam.   Pulses:   2+ and symmetric all extremities  Skin:  Skin color, texture, turgor normal, no rashes or lesions. Exam is limited due to not changing into gown. Hair thinning on anterior scalp is improved from prior visits.    Lymph nodes:   Cervical, supraclavicular nodes normal  Neurologic:   Normal strength, sensation and gait; reflexes 2+ and symmetric throughout                        Psych:   Normal mood, affect, hygiene and grooming.   Diabetic foot exam performed, normal.  Lab Results  Component Value Date   HGBA1C 5.8 (H) 12/14/2021    ASSESSMENT/PLAN:  No diagnosis found.   A1c, CBC, c-met, lipids, TSH, urine  microalb  Due for dermatology f/u with Dr. Pearline Cables. Referral entered for focus plan per [place].  He is treating her for  peri-oral dermatitis  Due for 2 year f/u in 10/22 with Dr. Oval Linsey. She had CT calcium done recently (free labs for doctor's day), with significant change.  Will send copy of note to Dr. Caprice Red if she needs to be seen sooner (pt asymptomatic, no sx of CAD), and whether or not ASA should be continued or not.  Discussed monthly self breast exams and yearly mammograms; at least 30 minutes of aerobic activity at least 5 days/week, weight-bearing exercise; proper sunscreen use reviewed; healthy diet, including goals of calcium and vitamin D intake and alcohol recommendations (less than or equal to 1 drink/day) reviewed; regular seatbelt use; changing batteries in smoke detectors.  Immunization recommendations discussed--continue yearly flu shots. COVID booster given today.  Colonoscopy recommendations reviewed, UTD  F/u 3 mos on DM, A1c at visit.

## 2022-03-09 ENCOUNTER — Other Ambulatory Visit (HOSPITAL_COMMUNITY): Payer: Self-pay

## 2022-03-09 ENCOUNTER — Encounter (INDEPENDENT_AMBULATORY_CARE_PROVIDER_SITE_OTHER): Payer: Self-pay | Admitting: Family Medicine

## 2022-03-09 ENCOUNTER — Telehealth (INDEPENDENT_AMBULATORY_CARE_PROVIDER_SITE_OTHER): Payer: No Typology Code available for payment source | Admitting: Family Medicine

## 2022-03-09 DIAGNOSIS — Z6841 Body Mass Index (BMI) 40.0 and over, adult: Secondary | ICD-10-CM | POA: Diagnosis not present

## 2022-03-09 DIAGNOSIS — E785 Hyperlipidemia, unspecified: Secondary | ICD-10-CM | POA: Diagnosis not present

## 2022-03-09 DIAGNOSIS — E669 Obesity, unspecified: Secondary | ICD-10-CM

## 2022-03-09 DIAGNOSIS — E1169 Type 2 diabetes mellitus with other specified complication: Secondary | ICD-10-CM

## 2022-03-09 DIAGNOSIS — Z7985 Long-term (current) use of injectable non-insulin antidiabetic drugs: Secondary | ICD-10-CM

## 2022-03-09 MED ORDER — TIRZEPATIDE 12.5 MG/0.5ML ~~LOC~~ SOAJ
12.5000 mg | SUBCUTANEOUS | 0 refills | Status: DC
  Filled 2022-03-09: qty 6, 84d supply, fill #0

## 2022-03-09 NOTE — Patient Instructions (Incomplete)
  HEALTH MAINTENANCE RECOMMENDATIONS:  It is recommended that you get at least 30 minutes of aerobic exercise at least 5 days/week (for weight loss, you may need as much as 60-90 minutes). This can be any activity that gets your heart rate up. This can be divided in 10-15 minute intervals if needed, but try and build up your endurance at least once a week.  Weight bearing exercise is also recommended twice weekly.  Eat a healthy diet with lots of vegetables, fruits and fiber.  "Colorful" foods have a lot of vitamins (ie green vegetables, tomatoes, red peppers, etc).  Limit sweet tea, regular sodas and alcoholic beverages, all of which has a lot of calories and sugar.  Up to 1 alcoholic drink daily may be beneficial for women (unless trying to lose weight, watch sugars).  Drink a lot of water.  Calcium recommendations are 1200-1500 mg daily (1500 mg for postmenopausal women or women without ovaries), and vitamin D 1000 IU daily.  This should be obtained from diet and/or supplements (vitamins), and calcium should not be taken all at once, but in divided doses.  Monthly self breast exams and yearly mammograms for women over the age of 72 is recommended.  Sunscreen of at least SPF 30 should be used on all sun-exposed parts of the skin when outside between the hours of 10 am and 4 pm (not just when at beach or pool, but even with exercise, golf, tennis, and yard work!)  Use a sunscreen that says "broad spectrum" so it covers both UVA and UVB rays, and make sure to reapply every 1-2 hours.  Remember to change the batteries in your smoke detectors when changing your clock times in the spring and fall. Carbon monoxide detectors are recommended for your home.  Use your seat belt every time you are in a car, and please drive safely and not be distracted with cell phones and texting while driving.  Prevnar-20 is recommended at age 65. Since you will still be on [employer], verify that this newer  vaccine is covered (known to be covered on Medicare).

## 2022-03-10 ENCOUNTER — Encounter: Payer: Self-pay | Admitting: Family Medicine

## 2022-03-10 ENCOUNTER — Other Ambulatory Visit (HOSPITAL_COMMUNITY): Payer: Self-pay

## 2022-03-10 ENCOUNTER — Ambulatory Visit (INDEPENDENT_AMBULATORY_CARE_PROVIDER_SITE_OTHER): Payer: No Typology Code available for payment source | Admitting: Family Medicine

## 2022-03-10 VITALS — BP 110/70 | HR 72 | Ht 63.0 in | Wt 236.4 lb

## 2022-03-10 DIAGNOSIS — D696 Thrombocytopenia, unspecified: Secondary | ICD-10-CM | POA: Diagnosis not present

## 2022-03-10 DIAGNOSIS — E1169 Type 2 diabetes mellitus with other specified complication: Secondary | ICD-10-CM | POA: Diagnosis not present

## 2022-03-10 DIAGNOSIS — Z0000 Encounter for general adult medical examination without abnormal findings: Secondary | ICD-10-CM | POA: Diagnosis not present

## 2022-03-10 DIAGNOSIS — I10 Essential (primary) hypertension: Secondary | ICD-10-CM

## 2022-03-10 DIAGNOSIS — R0781 Pleurodynia: Secondary | ICD-10-CM

## 2022-03-10 DIAGNOSIS — E1159 Type 2 diabetes mellitus with other circulatory complications: Secondary | ICD-10-CM | POA: Diagnosis not present

## 2022-03-10 DIAGNOSIS — I2510 Atherosclerotic heart disease of native coronary artery without angina pectoris: Secondary | ICD-10-CM

## 2022-03-10 DIAGNOSIS — Z6841 Body Mass Index (BMI) 40.0 and over, adult: Secondary | ICD-10-CM

## 2022-03-10 DIAGNOSIS — I700 Atherosclerosis of aorta: Secondary | ICD-10-CM | POA: Diagnosis not present

## 2022-03-10 DIAGNOSIS — E785 Hyperlipidemia, unspecified: Secondary | ICD-10-CM

## 2022-03-10 DIAGNOSIS — I152 Hypertension secondary to endocrine disorders: Secondary | ICD-10-CM

## 2022-03-10 LAB — POCT URINALYSIS DIP (PROADVANTAGE DEVICE)
Bilirubin, UA: NEGATIVE
Blood, UA: NEGATIVE
Glucose, UA: >=1000 mg/dL — AB
Ketones, POC UA: NEGATIVE mg/dL
Leukocytes, UA: NEGATIVE
Nitrite, UA: NEGATIVE
Protein Ur, POC: NEGATIVE mg/dL
Specific Gravity, Urine: 1.025
Urobilinogen, Ur: NEGATIVE
pH, UA: 6 (ref 5.0–8.0)

## 2022-03-10 MED ORDER — EMPAGLIFLOZIN 25 MG PO TABS
25.0000 mg | ORAL_TABLET | Freq: Every day | ORAL | 3 refills | Status: DC
  Filled 2022-03-10: qty 90, 90d supply, fill #0
  Filled 2022-06-01: qty 90, 90d supply, fill #1
  Filled 2022-09-01: qty 90, 90d supply, fill #2
  Filled 2022-11-30: qty 90, 90d supply, fill #3

## 2022-03-10 MED ORDER — LISINOPRIL 10 MG PO TABS
10.0000 mg | ORAL_TABLET | Freq: Every day | ORAL | 3 refills | Status: DC
  Filled 2022-03-10: qty 90, 90d supply, fill #0
  Filled 2022-06-01: qty 90, 90d supply, fill #1
  Filled 2022-09-01: qty 90, 90d supply, fill #2
  Filled 2022-11-30: qty 90, 90d supply, fill #3

## 2022-03-10 MED ORDER — TRAMADOL HCL 50 MG PO TABS
50.0000 mg | ORAL_TABLET | Freq: Three times a day (TID) | ORAL | 0 refills | Status: AC | PRN
  Filled 2022-03-10: qty 15, 5d supply, fill #0

## 2022-03-10 MED ORDER — MELOXICAM 15 MG PO TABS
15.0000 mg | ORAL_TABLET | Freq: Every day | ORAL | 0 refills | Status: DC
  Filled 2022-03-10: qty 30, 30d supply, fill #0

## 2022-03-11 LAB — CBC WITH DIFFERENTIAL/PLATELET
Basophils Absolute: 0 10*3/uL (ref 0.0–0.2)
Basos: 0 %
EOS (ABSOLUTE): 0.3 10*3/uL (ref 0.0–0.4)
Eos: 4 %
Hematocrit: 37.5 % (ref 34.0–46.6)
Hemoglobin: 12.3 g/dL (ref 11.1–15.9)
Immature Grans (Abs): 0 10*3/uL (ref 0.0–0.1)
Immature Granulocytes: 0 %
Lymphocytes Absolute: 2.3 10*3/uL (ref 0.7–3.1)
Lymphs: 37 %
MCH: 27.5 pg (ref 26.6–33.0)
MCHC: 32.8 g/dL (ref 31.5–35.7)
MCV: 84 fL (ref 79–97)
Monocytes Absolute: 0.6 10*3/uL (ref 0.1–0.9)
Monocytes: 9 %
Neutrophils Absolute: 3.1 10*3/uL (ref 1.4–7.0)
Neutrophils: 50 %
Platelets: 81 10*3/uL — CL (ref 150–450)
RBC: 4.47 x10E6/uL (ref 3.77–5.28)
RDW: 15.6 % — ABNORMAL HIGH (ref 11.7–15.4)
WBC: 6.4 10*3/uL (ref 3.4–10.8)

## 2022-03-11 LAB — TSH: TSH: 1.49 u[IU]/mL (ref 0.450–4.500)

## 2022-03-11 LAB — MICROALBUMIN / CREATININE URINE RATIO
Creatinine, Urine: 92.1 mg/dL
Microalb/Creat Ratio: <3 mg/g creat (ref 0–29)
Microalbumin, Urine: <3 ug/mL

## 2022-03-22 ENCOUNTER — Other Ambulatory Visit: Payer: Self-pay | Admitting: Family Medicine

## 2022-03-22 ENCOUNTER — Other Ambulatory Visit (HOSPITAL_COMMUNITY): Payer: Self-pay

## 2022-03-22 DIAGNOSIS — E785 Hyperlipidemia, unspecified: Secondary | ICD-10-CM

## 2022-03-22 DIAGNOSIS — I700 Atherosclerosis of aorta: Secondary | ICD-10-CM

## 2022-03-22 MED ORDER — SIMVASTATIN 40 MG PO TABS
40.0000 mg | ORAL_TABLET | Freq: Every evening | ORAL | 1 refills | Status: DC
  Filled 2022-03-22: qty 90, 90d supply, fill #0
  Filled 2022-06-21: qty 90, 90d supply, fill #1

## 2022-03-24 ENCOUNTER — Encounter (INDEPENDENT_AMBULATORY_CARE_PROVIDER_SITE_OTHER): Payer: Self-pay | Admitting: Bariatrics

## 2022-03-24 ENCOUNTER — Ambulatory Visit (INDEPENDENT_AMBULATORY_CARE_PROVIDER_SITE_OTHER): Payer: No Typology Code available for payment source | Admitting: Bariatrics

## 2022-03-24 VITALS — BP 121/78 | HR 65 | Temp 97.3°F | Ht 63.0 in | Wt 232.0 lb

## 2022-03-24 DIAGNOSIS — Z6841 Body Mass Index (BMI) 40.0 and over, adult: Secondary | ICD-10-CM | POA: Diagnosis not present

## 2022-03-24 DIAGNOSIS — Z7984 Long term (current) use of oral hypoglycemic drugs: Secondary | ICD-10-CM

## 2022-03-24 DIAGNOSIS — E785 Hyperlipidemia, unspecified: Secondary | ICD-10-CM

## 2022-03-24 DIAGNOSIS — E1169 Type 2 diabetes mellitus with other specified complication: Secondary | ICD-10-CM | POA: Diagnosis not present

## 2022-03-24 DIAGNOSIS — E669 Obesity, unspecified: Secondary | ICD-10-CM | POA: Diagnosis not present

## 2022-03-24 DIAGNOSIS — Z7985 Long-term (current) use of injectable non-insulin antidiabetic drugs: Secondary | ICD-10-CM

## 2022-03-25 NOTE — Progress Notes (Unsigned)
Chief Complaint:   OBESITY Cindy Carey is here to discuss her progress with her obesity treatment plan along with follow-up of her obesity related diagnoses. Cindy Carey is on the Category 2 Plan and the [place] and states she is following her eating plan approximately 75% of the time. Cindy Carey states she is walking the dog for 30 minutes 7 times per week.  Today's visit was #: 14 Starting weight: 251 lbs Starting date: 05/20/2021 Today's weight: 232 lbs Today's date: 03/24/2022 Total lbs lost to date: 19 lbs Total lbs lost since last in-office visit: 2 lbs  Interim History: Cindy Carey is down another 2 lbs since her last visit.   Subjective:   1. Type 2 diabetes mellitus with other specified complication, without long-term current use of insulin (HCC) Cindy Carey is currently taking Jardiance, Glucophage, and Mounjaro. She is appropriately hungry. She denies nausea.   2. Hyperlipidemia associated with type 2 diabetes mellitus ([place]) Cindy Carey is taking Zocor currently.   Assessment/Plan:   1. Type 2 diabetes mellitus with other specified complication, without long-term current use of insulin (HCC) Cindy Carey will continue Jardiance, Glucophage, and Mounjaro. Good blood sugar control is important to decrease the likelihood of diabetic complications such as nephropathy, neuropathy, limb loss, blindness, coronary artery disease, and death. Intensive lifestyle modification including diet, exercise and weight loss are the first line of treatment for diabetes.   2. Hyperlipidemia associated with type 2 diabetes mellitus ([place]) Cardiovascular risk and specific lipid/LDL goals reviewed.  Cindy Carey will continue Zocor. We discussed several lifestyle modifications today and Takia will continue to work on diet, exercise and weight loss efforts. Orders and follow up as documented in patient record.   Counseling Intensive lifestyle modifications are the first line treatment for this issue. Dietary changes: Increase soluble fiber.  Decrease simple carbohydrates. Exercise changes: Moderate to vigorous-intensity aerobic activity 150 minutes per week if tolerated. Lipid-lowering medications: see documented in medical record.  3. Obesity, current BMI 41.1 Cindy Carey is currently in the action stage of change. As such, her goal is to continue with weight loss efforts. She has agreed to the Category 2 Plan, keeping a food journal and adhering to recommended goals of 1200 calories and 80 grams of protein, and the [place].   Man will continue meal planning and intentional eating. She will prepare meals for breakfast.   Exercise goals:  Early will continue to walk daily. She will go to [employer] in the future. She is doing less activities due to rib pain.   Behavioral modification strategies: increasing lean protein intake, decreasing simple carbohydrates, increasing vegetables, increasing water intake, decreasing eating out, no skipping meals, meal planning and cooking strategies, keeping healthy foods in the home, and planning for success.  Cindy Carey has agreed to follow-up with our clinic in 8 weeks. She was informed of the importance of frequent follow-up visits to maximize her success with intensive lifestyle modifications for her multiple health conditions.   Objective:   Blood pressure 121/78, pulse 65, temperature (!) 97.3 F (36.3 C), height '5\' 3"'$  (1.6 m), weight 232 lb (105.2 kg), last menstrual period 10/18/1982, SpO2 97 %. Body mass index is 41.1 kg/m.  General: Cooperative, alert, well developed, in no acute distress. HEENT: Conjunctivae and lids unremarkable. Cardiovascular: Regular rhythm.  Lungs: Normal work of breathing. Neurologic: No focal deficits.   Lab Results  Component Value Date   CREATININE 0.75 12/14/2021   BUN 10 12/14/2021   NA 140 12/14/2021   K 4.4 12/14/2021  CL 105 12/14/2021   CO2 22 12/14/2021   Lab Results  Component Value Date   ALT 25 12/14/2021   AST 32 12/14/2021    ALKPHOS 140 (H) 12/14/2021   BILITOT 0.4 12/14/2021   Lab Results  Component Value Date   HGBA1C 5.8 (H) 12/14/2021   HGBA1C 6.4 (A) 06/08/2021   HGBA1C 7.8 (A) 03/09/2021   HGBA1C 7.0 (A) 07/09/2020   HGBA1C 6.5 (A) 12/20/2019   Lab Results  Component Value Date   INSULIN 76.3 (H) 12/14/2021   INSULIN 98.4 (H) 05/20/2021   INSULIN 40.3 (H) 03/23/2019   INSULIN 59.1 (H) 07/13/2018   Lab Results  Component Value Date   TSH 1.490 03/10/2022   Lab Results  Component Value Date   CHOL 137 12/14/2021   HDL 70 12/14/2021   LDLCALC 49 12/14/2021   TRIG 100 12/14/2021   CHOLHDL 1.9 03/09/2021   Lab Results  Component Value Date   VD25OH 41.4 03/23/2019   VD25OH 48.7 12/14/2018   VD25OH 50.0 07/13/2018   Lab Results  Component Value Date   WBC 6.4 03/10/2022   HGB 12.3 03/10/2022   HCT 37.5 03/10/2022   MCV 84 03/10/2022   PLT 81 (LL) 03/10/2022   Lab Results  Component Value Date   IRON 84 12/07/2017   Attestation Statements:   Reviewed by clinician on day of visit: allergies, medications, problem list, medical history, surgical history, family history, social history, and previous encounter notes.  I, Cindy Carey, RMA, am acting as Location manager for [employer], DO.  I have reviewed the above documentation for accuracy and completeness, and I agree with the above. Jearld Lesch, DO

## 2022-03-30 ENCOUNTER — Encounter (INDEPENDENT_AMBULATORY_CARE_PROVIDER_SITE_OTHER): Payer: Self-pay | Admitting: Bariatrics

## 2022-04-08 ENCOUNTER — Other Ambulatory Visit (HOSPITAL_COMMUNITY): Payer: Self-pay

## 2022-04-08 ENCOUNTER — Other Ambulatory Visit: Payer: Self-pay | Admitting: Family Medicine

## 2022-04-08 DIAGNOSIS — E119 Type 2 diabetes mellitus without complications: Secondary | ICD-10-CM

## 2022-04-08 MED ORDER — METFORMIN HCL ER 500 MG PO TB24
1000.0000 mg | ORAL_TABLET | Freq: Two times a day (BID) | ORAL | 1 refills | Status: DC
  Filled 2022-04-08: qty 360, 90d supply, fill #0
  Filled 2022-07-07: qty 360, 90d supply, fill #1

## 2022-04-13 ENCOUNTER — Other Ambulatory Visit: Payer: Self-pay | Admitting: Family Medicine

## 2022-04-13 DIAGNOSIS — Z1231 Encounter for screening mammogram for malignant neoplasm of breast: Secondary | ICD-10-CM

## 2022-04-15 ENCOUNTER — Other Ambulatory Visit: Payer: Self-pay | Admitting: Family Medicine

## 2022-04-15 ENCOUNTER — Other Ambulatory Visit (HOSPITAL_COMMUNITY): Payer: Self-pay

## 2022-04-15 MED ORDER — CYCLOBENZAPRINE HCL 10 MG PO TABS
10.0000 mg | ORAL_TABLET | Freq: Three times a day (TID) | ORAL | 0 refills | Status: DC | PRN
  Filled 2022-04-15: qty 30, 10d supply, fill #0

## 2022-04-15 NOTE — Telephone Encounter (Signed)
[facility] is requesting to fill pt flexeril. Please advise last appt was 03/10/22 for annual exam at which she reported she was not taking please advise . Kh

## 2022-04-19 NOTE — Progress Notes (Signed)
TeleHealth Visit:  This visit was completed with telemedicine (audio/video) technology. Cindy Carey has verbally consented to this TeleHealth visit. The patient is located at home, the provider is located at home. The participants in this visit include the listed provider and patient. The visit was conducted today via MyChart video.  OBESITY Cindy Carey is here to discuss her progress with her obesity treatment plan along with follow-up of her obesity related diagnoses.   Today's visit was # 15 Starting weight: 251 lbs Starting date: 05/20/2021 Weight at last in office visit: 232 lbs on 03/24/22 Total weight loss: 19 lbs at last in office visit on 03/24/22. Today's reported weight: 232 lbs   Nutrition Plan: the Category 2 Plan.- 75% adherence. Hunger is moderately controlled. Cravings are moderately controlled.  Current exercise:  Cindy Carey states she is walking the dog for 30 minutes 7 times per week.  Interim History: Cindy Carey feels like things are going well.  Weight is stable since last in office visit.  She reports occasional boredom eating. She eats 3 meals per day and does a great job with meal planning.  Reports if she does not meal plan she is not able to stay on plan as well.  She packs her lunch for work every day and plans dinner ahead of time.  Protein and water intake are good. She had a rib injury and has been dealing with pain from this for a few months-this is improving.  Once her rib is healed she plans on joining [employer].  Assessment/Plan:  1. Type II Diabetes A1c is at goal-5.8 on 12/14/2021.  Diabetes managed by PCP. CBGs: 100-110s Episodes of hypoglycemia? no Medication(s): Jardiance 25 mg daily, metformin XR 500 mg twice daily, Mounjaro 12.5 mg weekly. Hunger moderately well controlled with Mounjaro.  She feels the [facility] provides better appetite suppression than Ozempic.  Received 63-monthsupply and has 6 injections left of the 12.5 mg dose.  Lab Results  Component Value  Date   HGBA1C 5.8 (H) 12/14/2021   HGBA1C 6.4 (A) 06/08/2021   HGBA1C 7.8 (A) 03/09/2021   Lab Results  Component Value Date   MICROALBUR 0.3 03/31/2016   LDLCALC 49 12/14/2021   CREATININE 0.75 12/14/2021    Plan: Continue Jardiance 25 mg daily, metformin 500 mg twice daily, and Mounjaro 12.5 mg weekly.  2. Obesity: Current BMI 41.1 [name] currently in the action stage of change. As such, her goal is to continue with weight loss efforts.  She has agreed to the Category 2 Plan.   Exercise goals: as is  Behavioral modification strategies: increasing lean protein intake, decreasing simple carbohydrates, and planning for success.  Cindy Carey agreed to follow-up with our clinic in 4 weeks.   No orders of the defined types were placed in this encounter.   There are no discontinued medications.   No orders of the defined types were placed in this encounter.     Objective:   VITALS: Per patient if applicable, see vitals. GENERAL: Alert and in no acute distress. CARDIOPULMONARY: No increased WOB. Speaking in clear sentences.  PSYCH: Pleasant and cooperative. Speech normal rate and rhythm. Affect is appropriate. Insight and judgement are appropriate. Attention is focused, linear, and appropriate.  NEURO: Oriented as arrived to appointment on time with no prompting.   Lab Results  Component Value Date   CREATININE 0.75 12/14/2021   BUN 10 12/14/2021   NA 140 12/14/2021   K 4.4 12/14/2021   CL 105 12/14/2021   CO2 22  12/14/2021   Lab Results  Component Value Date   ALT 25 12/14/2021   AST 32 12/14/2021   ALKPHOS 140 (H) 12/14/2021   BILITOT 0.4 12/14/2021   Lab Results  Component Value Date   HGBA1C 5.8 (H) 12/14/2021   HGBA1C 6.4 (A) 06/08/2021   HGBA1C 7.8 (A) 03/09/2021   HGBA1C 7.0 (A) 07/09/2020   HGBA1C 6.5 (A) 12/20/2019   Lab Results  Component Value Date   INSULIN 76.3 (H) 12/14/2021   INSULIN 98.4 (H) 05/20/2021   INSULIN 40.3 (H) 03/23/2019    INSULIN 59.1 (H) 07/13/2018   Lab Results  Component Value Date   TSH 1.490 03/10/2022   Lab Results  Component Value Date   CHOL 137 12/14/2021   HDL 70 12/14/2021   LDLCALC 49 12/14/2021   TRIG 100 12/14/2021   CHOLHDL 1.9 03/09/2021   Lab Results  Component Value Date   WBC 6.4 03/10/2022   HGB 12.3 03/10/2022   HCT 37.5 03/10/2022   MCV 84 03/10/2022   PLT 81 (LL) 03/10/2022   Lab Results  Component Value Date   IRON 84 12/07/2017   Lab Results  Component Value Date   VD25OH 41.4 03/23/2019   VD25OH 48.7 12/14/2018   VD25OH 50.0 07/13/2018    Attestation Statements:   Reviewed by clinician on day of visit: allergies, medications, problem list, medical history, surgical history, family history, social history, and previous encounter notes.  Time spent on visit including the items listed below was 25 minutes.  -preparing to see the patient (e.g., review of tests, history, previous notes) -obtaining and/or reviewing separately obtained history -counseling and educating the patient/family/caregiver -documenting clinical information in the electronic or other health record

## 2022-04-21 ENCOUNTER — Encounter (INDEPENDENT_AMBULATORY_CARE_PROVIDER_SITE_OTHER): Payer: Self-pay | Admitting: Family Medicine

## 2022-04-21 ENCOUNTER — Telehealth (INDEPENDENT_AMBULATORY_CARE_PROVIDER_SITE_OTHER): Payer: No Typology Code available for payment source | Admitting: Family Medicine

## 2022-04-21 DIAGNOSIS — E1169 Type 2 diabetes mellitus with other specified complication: Secondary | ICD-10-CM | POA: Diagnosis not present

## 2022-04-21 DIAGNOSIS — E669 Obesity, unspecified: Secondary | ICD-10-CM | POA: Diagnosis not present

## 2022-04-21 DIAGNOSIS — E66813 Obesity, class 3: Secondary | ICD-10-CM

## 2022-04-21 DIAGNOSIS — Z6841 Body Mass Index (BMI) 40.0 and over, adult: Secondary | ICD-10-CM | POA: Diagnosis not present

## 2022-04-21 DIAGNOSIS — Z7984 Long term (current) use of oral hypoglycemic drugs: Secondary | ICD-10-CM

## 2022-04-21 DIAGNOSIS — Z7985 Long-term (current) use of injectable non-insulin antidiabetic drugs: Secondary | ICD-10-CM

## 2022-05-19 ENCOUNTER — Ambulatory Visit (INDEPENDENT_AMBULATORY_CARE_PROVIDER_SITE_OTHER): Payer: No Typology Code available for payment source | Admitting: Bariatrics

## 2022-05-24 ENCOUNTER — Encounter (INDEPENDENT_AMBULATORY_CARE_PROVIDER_SITE_OTHER): Payer: Self-pay | Admitting: Bariatrics

## 2022-05-24 ENCOUNTER — Other Ambulatory Visit (HOSPITAL_COMMUNITY): Payer: Self-pay

## 2022-05-24 ENCOUNTER — Ambulatory Visit (INDEPENDENT_AMBULATORY_CARE_PROVIDER_SITE_OTHER): Payer: No Typology Code available for payment source | Admitting: Bariatrics

## 2022-05-24 VITALS — BP 104/65 | HR 69 | Temp 97.7°F | Ht 63.0 in | Wt 234.0 lb

## 2022-05-24 DIAGNOSIS — E785 Hyperlipidemia, unspecified: Secondary | ICD-10-CM | POA: Diagnosis not present

## 2022-05-24 DIAGNOSIS — E1169 Type 2 diabetes mellitus with other specified complication: Secondary | ICD-10-CM | POA: Diagnosis not present

## 2022-05-24 DIAGNOSIS — E669 Obesity, unspecified: Secondary | ICD-10-CM | POA: Diagnosis not present

## 2022-05-24 DIAGNOSIS — Z6841 Body Mass Index (BMI) 40.0 and over, adult: Secondary | ICD-10-CM

## 2022-05-24 DIAGNOSIS — Z7985 Long-term (current) use of injectable non-insulin antidiabetic drugs: Secondary | ICD-10-CM

## 2022-05-24 DIAGNOSIS — Z7984 Long term (current) use of oral hypoglycemic drugs: Secondary | ICD-10-CM

## 2022-05-24 MED ORDER — TIRZEPATIDE 15 MG/0.5ML ~~LOC~~ SOAJ
15.0000 mg | SUBCUTANEOUS | 0 refills | Status: DC
  Filled 2022-05-24: qty 2, 28d supply, fill #0

## 2022-05-24 MED ORDER — ONDANSETRON HCL 4 MG PO TABS
4.0000 mg | ORAL_TABLET | Freq: Three times a day (TID) | ORAL | 0 refills | Status: DC | PRN
  Filled 2022-05-24: qty 20, 7d supply, fill #0

## 2022-05-26 ENCOUNTER — Encounter (INDEPENDENT_AMBULATORY_CARE_PROVIDER_SITE_OTHER): Payer: Self-pay

## 2022-05-31 NOTE — Progress Notes (Unsigned)
Chief Complaint:   OBESITY Cindy Carey is here to discuss her progress with her obesity treatment plan along with follow-up of her obesity related diagnoses. Cindy Carey is on the Category 2 Plan and states she is following her eating plan approximately 75% of the time. Cindy Carey states she is walking for 30 minutes 7 times per week, and at the gym for 30 minutes 4 times per week.  Today's visit was #: 16 Starting weight: 251 lbs Starting date: 05/20/2021 Today's weight: 234 lbs Today's date: 05/24/2022 Total lbs lost to date: 17 Total lbs lost since last in-office visit: 0  Interim History: Cindy Carey is up 2 pounds since her last visit.  She notes increased stress and she is not sleeping well.  Subjective:   1. Type 2 diabetes mellitus with other specified complication, without long-term current use of insulin (HCC) Cindy Carey is taking Jardiance, Mounjaro, and metformin.  2. Hyperlipidemia associated with type 2 diabetes mellitus ([place]) Cindy Carey is taking simvastatin.  Assessment/Plan:   1. Type 2 diabetes mellitus with other specified complication, without long-term current use of insulin (HCC) Cindy Carey agreed to increase Mounjaro from 12.5 mg to 15 mg once weekly, and we will refill for 1 month; she agreed to start Zofran 4 mg once every 8 hours as needed with no refills.  - tirzepatide (MOUNJARO) 15 MG/0.5ML Pen; Inject 15 mg into the skin once a week.  Dispense: 2 mL; Refill: 0 - ondansetron (ZOFRAN) 4 MG tablet; Take 1 tablet (4 mg total) by mouth every 8 (eight) hours as needed for nausea or vomiting.  Dispense: 20 tablet; Refill: 0  2. Hyperlipidemia associated with type 2 diabetes mellitus ([place]) Cindy Carey will eliminate trans fats, and will increase MUFAs and PUFAs.  3. Obesity, current BMI 41.5 Cindy Carey is currently in the action stage of change. As such, her goal is to continue with weight loss efforts. She has agreed to the Category 1 Plan and the Category 2 Plan.   Meal planning was discussed.  She will get  back on track.  Increase water intake.  Exercise goals: As is.   Behavioral modification strategies: increasing lean protein intake, decreasing simple carbohydrates, increasing vegetables, increasing water intake, decreasing eating out, no skipping meals, meal planning and cooking strategies, keeping healthy foods in the home, and planning for success.  Cindy Carey has agreed to follow-up with our clinic in 4 weeks. She was informed of the importance of frequent follow-up visits to maximize her success with intensive lifestyle modifications for her multiple health conditions.   Objective:   Blood pressure 104/65, pulse 69, temperature 97.7 F (36.5 C), height '5\' 3"'$  (1.6 m), weight 234 lb (106.1 kg), last menstrual period 10/18/1982, SpO2 92 %. Body mass index is 41.45 kg/m.  General: Cooperative, alert, well developed, in no acute distress. HEENT: Conjunctivae and lids unremarkable. Cardiovascular: Regular rhythm.  Lungs: Normal work of breathing. Neurologic: No focal deficits.   Lab Results  Component Value Date   CREATININE 0.75 12/14/2021   BUN 10 12/14/2021   NA 140 12/14/2021   K 4.4 12/14/2021   CL 105 12/14/2021   CO2 22 12/14/2021   Lab Results  Component Value Date   ALT 25 12/14/2021   AST 32 12/14/2021   ALKPHOS 140 (H) 12/14/2021   BILITOT 0.4 12/14/2021   Lab Results  Component Value Date   HGBA1C 5.8 (H) 12/14/2021   HGBA1C 6.4 (A) 06/08/2021   HGBA1C 7.8 (A) 03/09/2021   HGBA1C 7.0 (A) 07/09/2020  HGBA1C 6.5 (A) 12/20/2019   Lab Results  Component Value Date   INSULIN 76.3 (H) 12/14/2021   INSULIN 98.4 (H) 05/20/2021   INSULIN 40.3 (H) 03/23/2019   INSULIN 59.1 (H) 07/13/2018   Lab Results  Component Value Date   TSH 1.490 03/10/2022   Lab Results  Component Value Date   CHOL 137 12/14/2021   HDL 70 12/14/2021   LDLCALC 49 12/14/2021   TRIG 100 12/14/2021   CHOLHDL 1.9 03/09/2021   Lab Results  Component Value Date   VD25OH 41.4 03/23/2019    VD25OH 48.7 12/14/2018   VD25OH 50.0 07/13/2018   Lab Results  Component Value Date   WBC 6.4 03/10/2022   HGB 12.3 03/10/2022   HCT 37.5 03/10/2022   MCV 84 03/10/2022   PLT 81 (LL) 03/10/2022   Lab Results  Component Value Date   IRON 84 12/07/2017   Attestation Statements:   Reviewed by clinician on day of visit: allergies, medications, problem list, medical history, surgical history, family history, social history, and previous encounter notes.   Wilhemena Durie, am acting as Location manager for [employer], DO.  I have reviewed the above documentation for accuracy and completeness, and I agree with the above. Jearld Lesch, DO

## 2022-06-01 ENCOUNTER — Encounter (INDEPENDENT_AMBULATORY_CARE_PROVIDER_SITE_OTHER): Payer: Self-pay | Admitting: Bariatrics

## 2022-06-01 ENCOUNTER — Other Ambulatory Visit: Payer: Self-pay | Admitting: Family Medicine

## 2022-06-01 ENCOUNTER — Other Ambulatory Visit (HOSPITAL_COMMUNITY): Payer: Self-pay

## 2022-06-02 ENCOUNTER — Other Ambulatory Visit (HOSPITAL_COMMUNITY): Payer: Self-pay

## 2022-06-02 MED ORDER — EPINEPHRINE 0.3 MG/0.3ML IJ SOAJ
0.3000 mg | Freq: Once | INTRAMUSCULAR | 0 refills | Status: DC | PRN
  Filled 2022-06-02: qty 2, 5d supply, fill #0

## 2022-06-02 NOTE — Telephone Encounter (Signed)
Is this okay to refill? 

## 2022-06-07 ENCOUNTER — Encounter: Payer: Self-pay | Admitting: Family Medicine

## 2022-06-17 NOTE — Progress Notes (Signed)
TeleHealth Visit:  This visit was completed with telemedicine (audio/video) technology. Cindy Carey has verbally consented to this TeleHealth visit. The patient is located at home, the provider is located at home. The participants in this visit include the listed provider and patient. The visit was conducted today via MyChart video.  OBESITY Cindy Carey is here to discuss her progress with her obesity treatment plan along with follow-up of her obesity related diagnoses.   Today's visit was # 17 Starting weight: 251 lbs Starting date: 05/20/2021 Weight at last in office visit: 234 lbs on 05/24/22 Total weight loss: 17 lbs at last in office visit on 05/24/22. Today's reported weight: 232 lbs   Nutrition Plan: the Category 1 Plan and the Category 2 Plan.  Hunger is well controlled. Cravings are well controlled.  Current exercise: Recently started [employer] and is going 3-4 times per week and doing the treadmill/bike for 30 to 45 minutes.  She is a bit intimidated by using the weight machines.  Interim History: She is sticking the plan very well.  She follows category 1 some of the time and category 2 some of the time.  Her weight is currently plateaued.  She has not had any weight loss since January of this year.   She reports her sleep is better recently-sleep had been disturbed due to pain from a rib injury. She does a very good job with meal planning and packing her lunch. She is a Software engineer for [employer].  Assessment/Plan:  1. Type II Diabetes HgbA1c is at goal. Last A1c was 5.8 on 12/14/2021. CBGs: No blood sugars greater than 120-fasting and postprandial. Episodes of hypoglycemia: no Medication(s): Mounjaro 15 mg weekly, Jardiance 25 mg daily, metformin XR 500 mg twice daily. Has had a skin reaction (itching, 2 to 3 inch red area around injection site) the past 3 to 4 Mounjaro injections including 1 dose at 12.5 mg.  Lab Results  Component Value Date   HGBA1C 5.8 (H)  12/14/2021   HGBA1C 6.4 (A) 06/08/2021   HGBA1C 7.8 (A) 03/09/2021   Lab Results  Component Value Date   MICROALBUR 0.3 03/31/2016   LDLCALC 49 12/14/2021   CREATININE 0.75 12/14/2021    Plan: Continue Jardiance 25 mg daily, and metformin XR 500 mg twice daily. Discontinue Mounjaro. Start Ozempic 2 mg weekly. Check A1c next office visit.  2. Hyperlipidemia LDL is at goal.  Last LDL was 49 on 12/14/2021.  HDL and triglycerides normal. Medication(s): Simvastatin 40 mg daily. Cardiovascular risk factors: diabetes mellitus, dyslipidemia, hypertension, and obesity (BMI >= 30 kg/m2)  Lab Results  Component Value Date   CHOL 137 12/14/2021   HDL 70 12/14/2021   LDLCALC 49 12/14/2021   TRIG 100 12/14/2021   CHOLHDL 1.9 03/09/2021   Lab Results  Component Value Date   ALT 25 12/14/2021   AST 32 12/14/2021   ALKPHOS 140 (H) 12/14/2021   BILITOT 0.4 12/14/2021   The 10-year ASCVD risk score (Arnett DK, et al., 2019) is: 6.1%   Values used to calculate the score:     Age: 66 years     Sex: Female     Is Non-Hispanic African American: No     Diabetic: Yes     Tobacco smoker: No     Systolic Blood Pressure: 951 mmHg     Is BP treated: Yes     HDL Cholesterol: 70 mg/dL     Total Cholesterol: 137 mg/dL  Plan: Continue simvastatin 40 mg daily. Check  lipid profile next office visit. Continue meal plan and regular exercise.   3. Obesity: Current BMI 41.5 Cindy Carey is currently in the action stage of change. As such, her goal is to continue with weight loss efforts.  She has agreed to the Category 1 Plan.   Check IC and fasting labs next office visit.  Exercise goals: Continue cardio at [employer] 3-4 times per week.  She will try a few of the arm machines over the next few weeks.   Behavioral modification strategies: increasing lean protein intake, decreasing simple carbohydrates, and planning for success.  Cindy Carey has agreed to follow-up with our clinic in 5 weeks.   No  orders of the defined types were placed in this encounter.   Medications Discontinued During This Encounter  Medication Reason   tirzepatide [facility]) 15 MG/0.5ML Pen Side effect (s)     Meds ordered this encounter  Medications   Semaglutide, 2 MG/DOSE, 8 MG/3ML SOPN    Sig: Inject 2 mg as directed once a week.    Dispense:  3 mL    Refill:  0    Order Specific Question:   Supervising Provider    Answer:   Dennard Nip D [[id number]]      Objective:   VITALS: Per patient if applicable, see vitals. GENERAL: Alert and in no acute distress. CARDIOPULMONARY: No increased WOB. Speaking in clear sentences.  PSYCH: Pleasant and cooperative. Speech normal rate and rhythm. Affect is appropriate. Insight and judgement are appropriate. Attention is focused, linear, and appropriate.  NEURO: Oriented as arrived to appointment on time with no prompting.   Lab Results  Component Value Date   CREATININE 0.75 12/14/2021   BUN 10 12/14/2021   NA 140 12/14/2021   K 4.4 12/14/2021   CL 105 12/14/2021   CO2 22 12/14/2021   Lab Results  Component Value Date   ALT 25 12/14/2021   AST 32 12/14/2021   ALKPHOS 140 (H) 12/14/2021   BILITOT 0.4 12/14/2021   Lab Results  Component Value Date   HGBA1C 5.8 (H) 12/14/2021   HGBA1C 6.4 (A) 06/08/2021   HGBA1C 7.8 (A) 03/09/2021   HGBA1C 7.0 (A) 07/09/2020   HGBA1C 6.5 (A) 12/20/2019   Lab Results  Component Value Date   INSULIN 76.3 (H) 12/14/2021   INSULIN 98.4 (H) 05/20/2021   INSULIN 40.3 (H) 03/23/2019   INSULIN 59.1 (H) 07/13/2018   Lab Results  Component Value Date   TSH 1.490 03/10/2022   Lab Results  Component Value Date   CHOL 137 12/14/2021   HDL 70 12/14/2021   LDLCALC 49 12/14/2021   TRIG 100 12/14/2021   CHOLHDL 1.9 03/09/2021   Lab Results  Component Value Date   WBC 6.4 03/10/2022   HGB 12.3 03/10/2022   HCT 37.5 03/10/2022   MCV 84 03/10/2022   PLT 81 (LL) 03/10/2022   Lab Results  Component Value Date    IRON 84 12/07/2017   Lab Results  Component Value Date   VD25OH 41.4 03/23/2019   VD25OH 48.7 12/14/2018   VD25OH 50.0 07/13/2018    Attestation Statements:   Reviewed by clinician on day of visit: allergies, medications, problem list, medical history, surgical history, family history, social history, and previous encounter notes.

## 2022-06-22 ENCOUNTER — Other Ambulatory Visit (HOSPITAL_COMMUNITY): Payer: Self-pay

## 2022-06-22 ENCOUNTER — Telehealth (INDEPENDENT_AMBULATORY_CARE_PROVIDER_SITE_OTHER): Payer: No Typology Code available for payment source | Admitting: Family Medicine

## 2022-06-22 ENCOUNTER — Encounter (INDEPENDENT_AMBULATORY_CARE_PROVIDER_SITE_OTHER): Payer: Self-pay | Admitting: Family Medicine

## 2022-06-22 DIAGNOSIS — E669 Obesity, unspecified: Secondary | ICD-10-CM | POA: Diagnosis not present

## 2022-06-22 DIAGNOSIS — E785 Hyperlipidemia, unspecified: Secondary | ICD-10-CM | POA: Diagnosis not present

## 2022-06-22 DIAGNOSIS — Z7985 Long-term (current) use of injectable non-insulin antidiabetic drugs: Secondary | ICD-10-CM

## 2022-06-22 DIAGNOSIS — Z6841 Body Mass Index (BMI) 40.0 and over, adult: Secondary | ICD-10-CM

## 2022-06-22 DIAGNOSIS — Z7984 Long term (current) use of oral hypoglycemic drugs: Secondary | ICD-10-CM

## 2022-06-22 DIAGNOSIS — E1169 Type 2 diabetes mellitus with other specified complication: Secondary | ICD-10-CM | POA: Diagnosis not present

## 2022-06-22 MED ORDER — SEMAGLUTIDE (2 MG/DOSE) 8 MG/3ML ~~LOC~~ SOPN
2.0000 mg | PEN_INJECTOR | SUBCUTANEOUS | 0 refills | Status: DC
  Filled 2022-06-22: qty 3, 28d supply, fill #0

## 2022-07-07 ENCOUNTER — Other Ambulatory Visit (HOSPITAL_COMMUNITY): Payer: Self-pay

## 2022-07-21 ENCOUNTER — Other Ambulatory Visit (INDEPENDENT_AMBULATORY_CARE_PROVIDER_SITE_OTHER): Payer: Self-pay | Admitting: Family Medicine

## 2022-07-21 ENCOUNTER — Other Ambulatory Visit (HOSPITAL_COMMUNITY): Payer: Self-pay

## 2022-07-21 ENCOUNTER — Other Ambulatory Visit: Payer: Self-pay | Admitting: Family Medicine

## 2022-07-21 DIAGNOSIS — E1169 Type 2 diabetes mellitus with other specified complication: Secondary | ICD-10-CM

## 2022-07-21 MED ORDER — BLOOD GLUCOSE MONITOR SYSTEM W/DEVICE KIT
PACK | 0 refills | Status: AC | PRN
  Filled 2022-07-21: qty 1, 1d supply, fill #0

## 2022-07-22 ENCOUNTER — Other Ambulatory Visit (HOSPITAL_COMMUNITY): Payer: Self-pay

## 2022-07-22 MED ORDER — OZEMPIC (2 MG/DOSE) 8 MG/3ML ~~LOC~~ SOPN
2.0000 mg | PEN_INJECTOR | SUBCUTANEOUS | 0 refills | Status: DC
  Filled 2022-07-22: qty 3, 28d supply, fill #0

## 2022-07-28 ENCOUNTER — Ambulatory Visit: Payer: No Typology Code available for payment source | Admitting: Family Medicine

## 2022-07-29 ENCOUNTER — Ambulatory Visit (INDEPENDENT_AMBULATORY_CARE_PROVIDER_SITE_OTHER): Payer: No Typology Code available for payment source | Admitting: Adult Health

## 2022-07-30 ENCOUNTER — Encounter (HOSPITAL_BASED_OUTPATIENT_CLINIC_OR_DEPARTMENT_OTHER): Payer: Self-pay | Admitting: Cardiovascular Disease

## 2022-07-30 ENCOUNTER — Ambulatory Visit (INDEPENDENT_AMBULATORY_CARE_PROVIDER_SITE_OTHER): Payer: No Typology Code available for payment source | Admitting: Cardiovascular Disease

## 2022-07-30 ENCOUNTER — Other Ambulatory Visit (HOSPITAL_BASED_OUTPATIENT_CLINIC_OR_DEPARTMENT_OTHER): Payer: Self-pay

## 2022-07-30 ENCOUNTER — Encounter: Payer: Self-pay | Admitting: Family Medicine

## 2022-07-30 ENCOUNTER — Other Ambulatory Visit: Payer: Self-pay

## 2022-07-30 VITALS — BP 114/66 | HR 70 | Ht 63.0 in | Wt 233.7 lb

## 2022-07-30 DIAGNOSIS — E785 Hyperlipidemia, unspecified: Secondary | ICD-10-CM

## 2022-07-30 DIAGNOSIS — E1169 Type 2 diabetes mellitus with other specified complication: Secondary | ICD-10-CM

## 2022-07-30 DIAGNOSIS — I2510 Atherosclerotic heart disease of native coronary artery without angina pectoris: Secondary | ICD-10-CM

## 2022-07-30 DIAGNOSIS — I700 Atherosclerosis of aorta: Secondary | ICD-10-CM

## 2022-07-30 DIAGNOSIS — I10 Essential (primary) hypertension: Secondary | ICD-10-CM | POA: Diagnosis not present

## 2022-07-30 MED ORDER — INFLUENZA VAC SPLIT QUAD 0.5 ML IM SUSY
PREFILLED_SYRINGE | INTRAMUSCULAR | 0 refills | Status: DC
  Filled 2022-07-30: qty 0.5, 1d supply, fill #0

## 2022-07-30 MED ORDER — COVID-19 MRNA 2023-2024 VACCINE (COMIRNATY) 0.3 ML INJECTION
INTRAMUSCULAR | 0 refills | Status: DC
  Filled 2022-07-30: qty 0.3, 1d supply, fill #0

## 2022-07-30 NOTE — Assessment & Plan Note (Signed)
Lipids are well controlled on simvastatin.  LDL goal is less than 70.

## 2022-07-30 NOTE — Assessment & Plan Note (Signed)
She has been doing a great job of working with healthy weight and wellness and exercising.  Continue Ozempic.

## 2022-07-30 NOTE — Progress Notes (Signed)
Cardiology Office Note   Date:  07/30/2022   ID:  Cindy, Carey [birth year], MRN [id number]  PCP:  Rita Ohara, MD  Cardiologist:   Skeet Latch, MD   No chief complaint on file.  History of Present Illness: Cindy Carey is a 66 y.o. female with non-obstructive CAD, hypertension, hyperlipidemia, diabetes and morbid obesity here for follow up.  She was initially seen 07/2015 for evaluation of an abnormal EKG.  Cindy Carey was seen by anesthesia for pre-operative assessment was noted to have an incomplete bundle branch block.  Given that she has a strong family history of heart disease, she requested follow up with cardiology.  At that time she was feeling well and had no additional testing.  She followed up with Jory Sims, DNP, on 12/2017 for pre-operative risk assessment prior to bariatric surgery.  Her mobility was limited due to knee pain.  She was referred for cardiac CT-A 01/18/18 that revealed a small amount of calcified plaque in the LAD but no obstructive CAD.  Her coronary calcium score was 1, which was 64th percentile.   Since her last appointment she has been doing well.  She has been working out regularly at [employer].  She goes 3 to 4 days/week.  She also does a lot of outdoor activities and hiking.  She is noted improvement in her exertional capacity.  She is not had any chest pain or shortness of breath.  She used to have pain in her legs but that has improved.  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  At home her blood pressures have been typically in the 110s.  She denies any lightheadedness or dizziness.  Overall she is well without complaint today.  She notes that since her last appointment cousin was diagnosed with heart failure.  This was random as the person was otherwise healthy and there were no clear precipitating factors.  Past Medical History:  Diagnosis Date   Anemia    Asthma    Atypical ductal hyperplasia  of left breast    Back pain    Blood transfusion without reported diagnosis 1983   CAD (coronary artery disease)    Diabetes mellitus without complication ([place]) 6283   T2DM   Dyspareunia    Exercise-induced asthma    worse in winter   Food allergy    H/O bone density study 2013   H/O cold sores    H/O colonoscopy 2009   History of endometriosis    Hyperlipidemia    Hypertension    Injury of tendon of rotator cuff 2015   gym injury (right)   Joint pain    Knee pain 08/2015   L>R   Migraine with typical aura    Mild CAD 04/04/2018   Mild disease on coronary CT-A 01/2018.   Morbid obesity ([place]) 09/04/2012   BMI 49.4 kg/m^2    Osteoarthritis    Plantar fasciitis, left 2019   Trigger finger of left thumb 08/2018   injected by Dr. Fredna Dow 09/18/18    Past Surgical History:  Procedure Laterality Date   BREAST BIOPSY  2010   BREAST LUMPECTOMY  2010   Benign   BREAST LUMPECTOMY WITH RADIOACTIVE SEED LOCALIZATION Left 07/23/2015   Procedure: LEFT BREAST SEED GUIDED EXCISION;  Surgeon: Rolm Bookbinder, MD;  Location: [facility];  Service: General;  Laterality: Left;   BREATH TEK H PYLORI  08/28/2012   Procedure: BREATH TEK H PYLORI;  Surgeon: Pedro Earls, MD;  Location: [facility];  Service: General;  Laterality: N/A;   CHOLECYSTECTOMY  2007   EXPLORATORY LAPAROTOMY  1983   endometriosis   GANGLION CYST EXCISION Left 1999   Foot   TOTAL ABDOMINAL HYSTERECTOMY W/ BILATERAL SALPINGOOPHORECTOMY  1984   endometriosis (and appendectomy)     Current Outpatient Medications  Medication Sig Dispense Refill   aspirin EC 81 MG tablet Take 81 mg by mouth daily.     Blood Glucose Monitoring Suppl (BLOOD GLUCOSE MONITOR SYSTEM) w/Device KIT Use to check blood sugar as needed as directed 1 kit 0   Calcium Carbonate-Vit D-Min (CALCIUM 600+D PLUS MINERALS) 600-400 MG-UNIT TABS Take 1 tablet by mouth 2 (two) times daily.     Cholecalciferol (VITAMIN D3) 125 MCG (5000 UT)  CAPS Take 1 capsule by mouth daily.     cyclobenzaprine (FLEXERIL) 10 MG tablet Take 1 tablet (10 mg total) by mouth 3 (three) times daily as needed for muscle spasms. 30 tablet 0   doxycycline (VIBRAMYCIN) 100 MG capsule Take 1 capsule (100 mg total) by mouth daily. 30 capsule 5   empagliflozin (JARDIANCE) 25 MG TABS tablet Take 1 tablet (25 mg total) by mouth daily with breakfast. 90 tablet 3   EPINEPHrine 0.3 mg/0.3 mL IJ SOAJ injection Inject 0.3 mg into the muscle once as needed for anaphylaxis. 2 each 0   famotidine (PEPCID) 20 MG tablet Take 20 mg by mouth at bedtime.     glucose blood test strip Test  2 (two) times daily. 100 each 2   Lancets (FREESTYLE) lancets 1 each by Other route 2 (two) times daily. Test twice daily Dx:E11.9 100 each 2   lisinopril (ZESTRIL) 10 MG tablet Take 1 tablet (10 mg total) by mouth daily. 90 tablet 3   Magnesium 250 MG TABS Take 250 mg by mouth daily.     metFORMIN (GLUCOPHAGE-XR) 500 MG 24 hr tablet Take 2 tablets (1,000 mg total) by mouth 2 (two) times daily. 360 tablet 1   metroNIDAZOLE (METROGEL) 0.75 % gel APPLY A SMALL AMOUNT TO AFFECTED AREA ON FACE TWICE DAILY 45 g 2   ondansetron (ZOFRAN) 4 MG tablet Take 1 tablet (4 mg total) by mouth every 8 (eight) hours as needed for nausea or vomiting. 20 tablet 0   promethazine (PHENERGAN) 25 MG tablet Take 1 tablet (25 mg total) by mouth every 8 (eight) hours as needed for nausea or vomiting. 30 tablet 0   Semaglutide, 2 MG/DOSE, (OZEMPIC, 2 MG/DOSE,) 8 MG/3ML SOPN Inject 2 mg as directed once a week. 3 mL 0   simvastatin (ZOCOR) 40 MG tablet Take 1 tablet (40 mg total) by mouth every evening. 90 tablet 1   No current facility-administered medications for this visit.    Allergies:   Bee venom, Shellfish allergy, Invokana [canagliflozin], Sulfamethoxazole-trimethoprim, and Penicillins    Social History:  The patient  reports that she has never smoked. She has never used smokeless tobacco. She reports that  she does not drink alcohol and does not use drugs.   Family History:  The patient's family history includes Breast cancer in her cousin, cousin, and paternal aunt; Cancer in her maternal aunt, maternal grandfather, maternal uncle, and paternal grandmother; Congestive Heart Failure in her maternal uncle; Congestive Heart Failure (age of onset: 42) in her cousin; Dementia in her paternal grandmother; Diabetes in her father and paternal grandfather; Diverticulitis (age of onset: 39) in her mother; Heart attack (age of onset: 16) in  her father; Heart disease in her father and paternal grandfather; Heart disease (age of onset: 39) in her mother; Heart failure in her cousin, maternal grandmother, and maternal uncle; Hypertension in her father and mother; Osteoporosis in her maternal grandmother; Peripheral Artery Disease in her father; Pneumonia in her sister; Thyroid disease in her mother.    ROS:   Please see the history of present illness. (+) Bilateral LE pain, calves All other systems are reviewed and negative.   PHYSICAL EXAM: VS:  BP 114/66 (BP Location: Right Arm, Patient Position: Sitting, Cuff Size: Large)   Pulse 70   Ht 5' 3"  (1.6 m)   Wt 233 lb 11.2 oz (106 kg)   LMP 10/18/1982   BMI 41.40 kg/m  , BMI Body mass index is 41.4 kg/m. GENERAL:  Well appearing HEENT: Pupils equal round and reactive, fundi not visualized, oral mucosa unremarkable NECK:  No jugular venous distention, waveform within normal limits, carotid upstroke brisk and symmetric, no bruits LUNGS:  Clear to auscultation bilaterally HEART:  RRR.  PMI not displaced or sustained,S1 and S2 within normal limits, no S3, no S4, no clicks, no rubs, no murmurs ABD:  Flat, positive bowel sounds normal in frequency in pitch, no bruits, no rebound, no guarding, no midline pulsatile mass, no hepatomegaly, no splenomegaly EXT:  2 plus pulses throughout, no edema, no cyanosis no clubbing SKIN:  No rashes no nodules NEURO:  Cranial  nerves II through XII grossly intact, motor grossly intact throughout PSYCH:  Cognitively intact, oriented to person place and time   EKG:   07/30/22: Sinus rhythm.  Rate 70 bpm.  LAFB.  IRBBB. 08/14/2019: Sinus rhythm.  Rate 80 bpm.  LAFB. 07/18/15: sinus rhythm at 70 bpm.  Left anterior fascicular block. Incomplete right bundle branch block. Low voltage in the precordial leads.  Calcium Score 02/27/2021: FINDINGS: Coronary arteries: Normal origins.   Coronary Calcium Score:   Left main: 0   Left anterior descending artery: 28   Left circumflex artery: 7   Right coronary artery: 1   Total: 36   Percentile: 74   Pericardium: Normal.   Ascending Aorta: Normal caliber. Focal aortic arch small atherosclerosis.   Non-cardiac: See separate report from [facility].   IMPRESSION: Coronary calcium score of 36. This was 69 percentile for age-, race-, and sex-matched controls.   Aortic arch atherosclerosis, small focal area.  Cardiac CT-A 01/18/18: IMPRESSION: 1. Coronary artery calcium score 1 Agatston unit, Placing the patient in the 64th percentile for age and gender. This suggests intermediate risk for future cardiac events. 2.  No obstructive coronary disease.    Recent Labs: 12/14/2021: ALT 25; BUN 10; Creatinine, Ser 0.75; Potassium 4.4; Sodium 140 03/10/2022: Hemoglobin 12.3; Platelets 81; TSH 1.490    Lipid Panel    Component Value Date/Time   CHOL 137 12/14/2021 0812   TRIG 100 12/14/2021 0812   HDL 70 12/14/2021 0812   CHOLHDL 1.9 03/09/2021 1119   CHOLHDL 2.3 09/30/2016 0811   VLDL 26 09/30/2016 0811   LDLCALC 49 12/14/2021 0812      Wt Readings from Last 3 Encounters:  07/30/22 233 lb 11.2 oz (106 kg)  05/24/22 234 lb (106.1 kg)  03/24/22 232 lb (105.2 kg)      ASSESSMENT AND PLAN:  Mild CAD Minimal nonobstructive disease on CTA in 2019.  She is exercising regularly has no exertional symptoms.  Risk factors are well controlled.  She is  doing a great job of working on [employer]  and exercise and weight loss.  Continue aspirin and simvastatin.  LDL is at goal.  Hyperlipidemia associated with type 2 diabetes mellitus ([place]) Lipids are well controlled on simvastatin.  LDL goal is less than 70.  Severe obesity (BMI >= 40) (HCC) She has been doing a great job of working with healthy weight and wellness and exercising.  Continue Ozempic.   Current medicines are reviewed at length with the patient today.  The patient does not have concerns regarding medicines.  The following changes have been made: None  Labs/ tests ordered today include:   Orders Placed This Encounter  Procedures   EKG 12-Lead    Disposition:   FU with Gabriel Conry C. Oval Linsey, MD in 1 years.    Signed, Skeet Latch, MD  07/30/2022 9:19 AM    [place]

## 2022-07-30 NOTE — Telephone Encounter (Signed)
Okay for referral?

## 2022-07-30 NOTE — Patient Instructions (Signed)
Medication Instructions:  Continue current medications  *If you need a refill on your cardiac medications before your next appointment, please call your pharmacy*   Lab Work: None Ordered   Testing/Procedures: None Ordered   Follow-Up: At [facility], you and your health needs are our priority.  As part of our continuing mission to provide you with exceptional heart care, we have created designated Provider Care Teams.  These Care Teams include your primary Cardiologist (physician) and Advanced Practice Providers (APPs -  Physician Assistants and Nurse Practitioners) who all work together to provide you with the care you need, when you need it.  We recommend signing up for the patient portal called "MyChart".  Sign up information is provided on this After Visit Summary.  MyChart is used to connect with patients for Virtual Visits (Telemedicine).  Patients are able to view lab/test results, encounter notes, upcoming appointments, etc.  Non-urgent messages can be sent to your provider as well.   To learn more about what you can do with MyChart, go to [email or web address].    Your next appointment:   1 year(s)  The format for your next appointment:   In Person  Provider:   Tiffany Cumberland, MD    Other Instructions          

## 2022-07-30 NOTE — Assessment & Plan Note (Signed)
Minimal nonobstructive disease on CTA in 2019.  She is exercising regularly has no exertional symptoms.  Risk factors are well controlled.  She is doing a great job of working on diet and exercise and weight loss.  Continue aspirin and simvastatin.  LDL is at goal.

## 2022-08-03 ENCOUNTER — Encounter (INDEPENDENT_AMBULATORY_CARE_PROVIDER_SITE_OTHER): Payer: Self-pay | Admitting: Adult Health

## 2022-08-03 ENCOUNTER — Ambulatory Visit (INDEPENDENT_AMBULATORY_CARE_PROVIDER_SITE_OTHER): Payer: No Typology Code available for payment source | Admitting: Adult Health

## 2022-08-03 VITALS — BP 121/80 | HR 67 | Temp 97.9°F | Ht 63.0 in | Wt 227.0 lb

## 2022-08-03 DIAGNOSIS — E785 Hyperlipidemia, unspecified: Secondary | ICD-10-CM

## 2022-08-03 DIAGNOSIS — E669 Obesity, unspecified: Secondary | ICD-10-CM | POA: Diagnosis not present

## 2022-08-03 DIAGNOSIS — E559 Vitamin D deficiency, unspecified: Secondary | ICD-10-CM

## 2022-08-03 DIAGNOSIS — Z6841 Body Mass Index (BMI) 40.0 and over, adult: Secondary | ICD-10-CM

## 2022-08-03 DIAGNOSIS — E1169 Type 2 diabetes mellitus with other specified complication: Secondary | ICD-10-CM | POA: Diagnosis not present

## 2022-08-03 DIAGNOSIS — Z7985 Long-term (current) use of injectable non-insulin antidiabetic drugs: Secondary | ICD-10-CM

## 2022-08-04 LAB — LIPID PANEL
Chol/HDL Ratio: 2 ratio (ref 0.0–4.4)
Cholesterol, Total: 132 mg/dL (ref 100–199)
HDL: 67 mg/dL (ref >39)
LDL Chol Calc (NIH): 46 mg/dL (ref 0–99)
Triglycerides: 103 mg/dL (ref 0–149)
VLDL Cholesterol Cal: 19 mg/dL (ref 5–40)

## 2022-08-04 LAB — HEMOGLOBIN A1C
Est. average glucose Bld gHb Est-mCnc: 108 mg/dL
Hgb A1c MFr Bld: 5.4 % (ref 4.8–5.6)

## 2022-08-04 LAB — COMPREHENSIVE METABOLIC PANEL
ALT: 20 IU/L (ref 0–32)
AST: 31 IU/L (ref 0–40)
Albumin/Globulin Ratio: 1.5 (ref 1.2–2.2)
Albumin: 4.3 g/dL (ref 3.9–4.9)
Alkaline Phosphatase: 118 IU/L (ref 44–121)
BUN/Creatinine Ratio: 17 (ref 12–28)
BUN: 13 mg/dL (ref 8–27)
Bilirubin Total: 0.7 mg/dL (ref 0.0–1.2)
CO2: 22 mmol/L (ref 20–29)
Calcium: 9.8 mg/dL (ref 8.7–10.3)
Chloride: 104 mmol/L (ref 96–106)
Creatinine, Ser: 0.75 mg/dL (ref 0.57–1.00)
Globulin, Total: 2.9 g/dL (ref 1.5–4.5)
Glucose: 81 mg/dL (ref 70–99)
Potassium: 5 mmol/L (ref 3.5–5.2)
Sodium: 142 mmol/L (ref 134–144)
Total Protein: 7.2 g/dL (ref 6.0–8.5)
eGFR: 89 mL/min/{1.73_m2} (ref >59)

## 2022-08-04 LAB — VITAMIN D 25 HYDROXY (VIT D DEFICIENCY, FRACTURES): Vit D, 25-Hydroxy: 56.6 ng/mL (ref 30.0–100.0)

## 2022-08-04 LAB — INSULIN, RANDOM: INSULIN: 39.2 u[IU]/mL — ABNORMAL HIGH (ref 2.6–24.9)

## 2022-08-04 LAB — VITAMIN B12: Vitamin B-12: 237 pg/mL (ref 232–1245)

## 2022-08-10 NOTE — Progress Notes (Signed)
Chief Complaint:   OBESITY Cindy Carey is here to discuss her progress with her obesity treatment plan along with follow-up of her obesity related diagnoses. Cindy Carey is on the Category 1 Plan and the Category 2 Plan and states she is following her eating plan approximately 90% of the time. Cindy Carey states she is at the gym 30-45 minutes 3 times per week.  Today's visit was #: 18 Starting weight: 251 lbs Starting date: 05/20/2021 Today's weight: 227 lbs Today's date: 08/03/2022 Total lbs lost to date: 24 lbs Total lbs lost since last in-office visit: 7 lbs  Interim History:  05/12/2018, RMR 1737 05/21/2019, RMR 1828 05/20/2021, RMR 1038 08/03/2022, RMR, 1886  Subjective:   1. Type 2 diabetes mellitus with other specified complication, without long-term current use of insulin (HCC) Mounjaro 12.5 mg replaced with Ozempic 2 mg at last office visit. She has remained on Jardiance '25mg'$  with breakfast and Metformin XR '500mg'$  2 tabs BID. She denies SE with any antidiabetic medication.  2. Hyperlipidemia associated with type 2 diabetes mellitus ([place]) Lipid Panel     Component Value Date/Time   CHOL 132 08/03/2022 0755   TRIG 103 08/03/2022 0755   HDL 67 08/03/2022 0755   CHOLHDL 2.0 08/03/2022 0755   CHOLHDL 2.3 09/30/2016 0811   VLDL 26 09/30/2016 0811   LDLCALC 46 08/03/2022 0755   LABVLDL 19 08/03/2022 0755   She is currently on daily Simvastatin '40mg'$  .  3. Vitamin D deficiency She is on OTC Calcium 600+D '400mg'$  QD and OTC Vit D3 5,000 IU QD.  Assessment/Plan:   1. Type 2 diabetes mellitus with other specified complication, without long-term current use of insulin (HCC)  - Comprehensive metabolic panel - Hemoglobin A1c - Insulin, random - Vitamin B12  2. Hyperlipidemia associated with type 2 diabetes mellitus ([place])  - Lipid panel  3. Vitamin D deficiency  - VITAMIN D 25 Hydroxy (Vit-D Deficiency, Fractures)  4. Obesity, current BMI 40.2 Cindy Carey is currently in the action  stage of change. As such, her goal is to continue with weight loss efforts. She has agreed to the Category 2 Plan+100 calories.    Exercise goals:  As is.   Behavioral modification strategies: increasing lean protein intake, decreasing simple carbohydrates, meal planning and cooking strategies, keeping healthy foods in the home, and planning for success.  Cindy Carey has agreed to follow-up with our clinic in 5 weeks. She was informed of the importance of frequent follow-up visits to maximize her success with intensive lifestyle modifications for her multiple health conditions.   Cindy Carey was informed we would discuss her lab results at her next visit unless there is a critical issue that needs to be addressed sooner. Cindy Carey agreed to keep her next visit at the agreed upon time to discuss these results.  Objective:   Blood pressure 121/80, pulse 67, temperature 97.9 F (36.6 C), height '5\' 3"'$  (1.6 m), weight 227 lb (103 kg), last menstrual period 10/18/1982, SpO2 95 %. Body mass index is 40.21 kg/m.  General: Cooperative, alert, well developed, in no acute distress. HEENT: Conjunctivae and lids unremarkable. Cardiovascular: Regular rhythm.  Lungs: Normal work of breathing. Neurologic: No focal deficits.   Lab Results  Component Value Date   CREATININE 0.75 08/03/2022   BUN 13 08/03/2022   NA 142 08/03/2022   K 5.0 08/03/2022   CL 104 08/03/2022   CO2 22 08/03/2022   Lab Results  Component Value Date   ALT 20 08/03/2022   AST 31  08/03/2022   ALKPHOS 118 08/03/2022   BILITOT 0.7 08/03/2022   Lab Results  Component Value Date   HGBA1C 5.4 08/03/2022   HGBA1C 5.8 (H) 12/14/2021   HGBA1C 6.4 (A) 06/08/2021   HGBA1C 7.8 (A) 03/09/2021   HGBA1C 7.0 (A) 07/09/2020   Lab Results  Component Value Date   INSULIN 39.2 (H) 08/03/2022   INSULIN 76.3 (H) 12/14/2021   INSULIN 98.4 (H) 05/20/2021   INSULIN 40.3 (H) 03/23/2019   INSULIN 59.1 (H) 07/13/2018   Lab Results  Component Value Date    TSH 1.490 03/10/2022   Lab Results  Component Value Date   CHOL 132 08/03/2022   HDL 67 08/03/2022   LDLCALC 46 08/03/2022   TRIG 103 08/03/2022   CHOLHDL 2.0 08/03/2022   Lab Results  Component Value Date   VD25OH 56.6 08/03/2022   VD25OH 41.4 03/23/2019   VD25OH 48.7 12/14/2018   Lab Results  Component Value Date   WBC 6.4 03/10/2022   HGB 12.3 03/10/2022   HCT 37.5 03/10/2022   MCV 84 03/10/2022   PLT 81 (LL) 03/10/2022   Lab Results  Component Value Date   IRON 84 12/07/2017    Attestation Statements:   Reviewed by clinician on day of visit: allergies, medications, problem list, medical history, surgical history, family history, social history, and previous encounter notes.  I, Davy Pique, RMA, am acting as Location manager for Mina Marble, NP.  I have reviewed the above documentation for accuracy and completeness, and I agree with the above. -  Emiline Mancebo d. Maruice Pieroni, NP-C

## 2022-08-23 ENCOUNTER — Telehealth (INDEPENDENT_AMBULATORY_CARE_PROVIDER_SITE_OTHER): Payer: No Typology Code available for payment source | Admitting: Family Medicine

## 2022-08-23 NOTE — Progress Notes (Unsigned)
TeleHealth Visit:  This visit was completed with telemedicine (audio/video) technology. [name] has verbally consented to this TeleHealth visit. The patient is located at home, the provider is located at home. The participants in this visit include the listed provider and patient. The visit was conducted today via MyChart video.  OBESITY Cindy Carey is here to discuss her progress with her obesity treatment plan along with follow-up of her obesity related diagnoses.   Today's visit was # 19 Starting weight: 251 lbs Starting date: 05/20/2021 Weight at last in office visit: 227 lbs on 08/03/22 Total weight loss: 24 lbs at last in office visit on 08/03/22. Today's reported weight: 225 lbs   Nutrition Plan: the Category 2 Plan + 100 calories 90% adherence.  Current exercise:  gym 30-45 minutes 3-4 times per week.   Interim History: Cindy Carey is adhering to plan very well.  She is consistent with exercise.  Her mother had a recent pelvic fracture but she continued to do well with the plan although this affected her exercise a bit.  She is now back to her normal schedule. Reports protein and water intake are good. Denies polyphagia or excessive cravings.  Assessment/Plan:  We discussed recent lab results from 08/03/2022 in depth. 1. Type II Diabetes HgbA1c is at goal.  A1c was 5.4, down from 5.8. Medication(s): Metformin XR 500 mg 2 tablets twice daily-has been on this since 2010.  B12 low normal (237, down from 354).  She does not take supplementation. Ozempic 2 mg weekly, Jardiance 25 mg daily  Lab Results  Component Value Date   HGBA1C 5.4 08/03/2022   HGBA1C 5.8 (H) 12/14/2021   HGBA1C 6.4 (A) 06/08/2021   Lab Results  Component Value Date   MICROALBUR 0.3 03/31/2016   LDLCALC 46 08/03/2022   CREATININE 0.75 08/03/2022    Plan: Continue Jardiance and metformin at current dosages. Refill Ozempic 2 mg weekly.  If unavailable will bridge with 1 mg. Start oral B12 500 mcg  daily.  2. Hyperlipidemia associated with type 2 diabetes LDL is at goal.  Lipid profile from 08/03/2022-LDL 46, HDL 67, triglyceride 103-all within normal limits. Medication(s): Simvastatin 40 mg daily Cardiovascular risk factors: diabetes mellitus, dyslipidemia, hypertension, and obesity (BMI >= 30 kg/m2)  Lab Results  Component Value Date   CHOL 132 08/03/2022   HDL 67 08/03/2022   LDLCALC 46 08/03/2022   TRIG 103 08/03/2022   CHOLHDL 2.0 08/03/2022   Lab Results  Component Value Date   ALT 20 08/03/2022   AST 31 08/03/2022   ALKPHOS 118 08/03/2022   BILITOT 0.7 08/03/2022   The 10-year ASCVD risk score (Arnett DK, et al., 2019) is: 8.2%   Values used to calculate the score:     Age: 29 years     Sex: Female     Is Non-Hispanic African American: No     Diabetic: Yes     Tobacco smoker: No     Systolic Blood Pressure: 315 mmHg     Is BP treated: Yes     HDL Cholesterol: 67 mg/dL     Total Cholesterol: 132 mg/dL  Plan: Continue statin.  3. Obesity: Current BMI 40.2 Cindy Carey is currently in the action stage of change. As such, her goal is to continue with weight loss efforts.  She has agreed to the Category 2 Plan plus 100 calories  Exercise goals: as is  Behavioral modification strategies: increasing lean protein intake, decreasing simple carbohydrates, and planning for success.  [name] has agreed to  follow-up with our clinic in 5 weeks.   No orders of the defined types were placed in this encounter.   Medications Discontinued During This Encounter  Medication Reason   famotidine (PEPCID) 20 MG tablet No longer needed (for PRN medications)   Semaglutide, 2 MG/DOSE, (OZEMPIC, 2 MG/DOSE,) 8 MG/3ML SOPN Reorder     Meds ordered this encounter  Medications   Semaglutide, 2 MG/DOSE, (OZEMPIC, 2 MG/DOSE,) 8 MG/3ML SOPN    Sig: Inject 2 mg as directed once a week.    Dispense:  3 mL    Refill:  0    Order Specific Question:   Supervising Provider    Answer:   Dell Ponto [[id number]]   cyanocobalamin (VITAMIN B12) 500 MCG tablet    Sig: Take 1 tablet (500 mcg total) by mouth daily.    Dispense:  30 tablet    Refill:  0    Order Specific Question:   Supervising Provider    Answer:   Dell Ponto [[id number]]      Objective:   VITALS: Per patient if applicable, see vitals. GENERAL: Alert and in no acute distress. CARDIOPULMONARY: No increased WOB. Speaking in clear sentences.  PSYCH: Pleasant and cooperative. Speech normal rate and rhythm. Affect is appropriate. Insight and judgement are appropriate. Attention is focused, linear, and appropriate.  NEURO: Oriented as arrived to appointment on time with no prompting.   Lab Results  Component Value Date   CREATININE 0.75 08/03/2022   BUN 13 08/03/2022   NA 142 08/03/2022   K 5.0 08/03/2022   CL 104 08/03/2022   CO2 22 08/03/2022   Lab Results  Component Value Date   ALT 20 08/03/2022   AST 31 08/03/2022   ALKPHOS 118 08/03/2022   BILITOT 0.7 08/03/2022   Lab Results  Component Value Date   HGBA1C 5.4 08/03/2022   HGBA1C 5.8 (H) 12/14/2021   HGBA1C 6.4 (A) 06/08/2021   HGBA1C 7.8 (A) 03/09/2021   HGBA1C 7.0 (A) 07/09/2020   Lab Results  Component Value Date   INSULIN 39.2 (H) 08/03/2022   INSULIN 76.3 (H) 12/14/2021   INSULIN 98.4 (H) 05/20/2021   INSULIN 40.3 (H) 03/23/2019   INSULIN 59.1 (H) 07/13/2018   Lab Results  Component Value Date   TSH 1.490 03/10/2022   Lab Results  Component Value Date   CHOL 132 08/03/2022   HDL 67 08/03/2022   LDLCALC 46 08/03/2022   TRIG 103 08/03/2022   CHOLHDL 2.0 08/03/2022   Lab Results  Component Value Date   WBC 6.4 03/10/2022   HGB 12.3 03/10/2022   HCT 37.5 03/10/2022   MCV 84 03/10/2022   PLT 81 (LL) 03/10/2022   Lab Results  Component Value Date   IRON 84 12/07/2017   Lab Results  Component Value Date   VD25OH 56.6 08/03/2022   VD25OH 41.4 03/23/2019   VD25OH 48.7 12/14/2018    Attestation Statements:   Reviewed by  clinician on day of visit: allergies, medications, problem list, medical history, surgical history, family history, social history, and previous encounter notes.

## 2022-08-24 ENCOUNTER — Other Ambulatory Visit (HOSPITAL_COMMUNITY): Payer: Self-pay

## 2022-08-24 ENCOUNTER — Telehealth (INDEPENDENT_AMBULATORY_CARE_PROVIDER_SITE_OTHER): Payer: No Typology Code available for payment source | Admitting: Family Medicine

## 2022-08-24 ENCOUNTER — Encounter (INDEPENDENT_AMBULATORY_CARE_PROVIDER_SITE_OTHER): Payer: Self-pay | Admitting: Family Medicine

## 2022-08-24 DIAGNOSIS — Z7984 Long term (current) use of oral hypoglycemic drugs: Secondary | ICD-10-CM

## 2022-08-24 DIAGNOSIS — E785 Hyperlipidemia, unspecified: Secondary | ICD-10-CM

## 2022-08-24 DIAGNOSIS — Z6841 Body Mass Index (BMI) 40.0 and over, adult: Secondary | ICD-10-CM

## 2022-08-24 DIAGNOSIS — Z7985 Long-term (current) use of injectable non-insulin antidiabetic drugs: Secondary | ICD-10-CM

## 2022-08-24 DIAGNOSIS — E669 Obesity, unspecified: Secondary | ICD-10-CM | POA: Diagnosis not present

## 2022-08-24 DIAGNOSIS — E1169 Type 2 diabetes mellitus with other specified complication: Secondary | ICD-10-CM | POA: Diagnosis not present

## 2022-08-24 MED ORDER — OZEMPIC (2 MG/DOSE) 8 MG/3ML ~~LOC~~ SOPN
2.0000 mg | PEN_INJECTOR | SUBCUTANEOUS | 0 refills | Status: DC
  Filled 2022-08-24: qty 3, 28d supply, fill #0

## 2022-08-24 MED ORDER — CYANOCOBALAMIN 500 MCG PO TABS: 500.0000 ug | ORAL_TABLET | Freq: Every day | ORAL | 0 refills | Status: DC

## 2022-08-30 ENCOUNTER — Other Ambulatory Visit: Payer: Self-pay | Admitting: Family Medicine

## 2022-08-30 DIAGNOSIS — Z1231 Encounter for screening mammogram for malignant neoplasm of breast: Secondary | ICD-10-CM

## 2022-09-01 ENCOUNTER — Other Ambulatory Visit (HOSPITAL_COMMUNITY): Payer: Self-pay

## 2022-09-03 ENCOUNTER — Ambulatory Visit
Admission: RE | Admit: 2022-09-03 | Discharge: 2022-09-03 | Disposition: A | Discharge status: Home / Self Care | Payer: No Typology Code available for payment source | Source: Ambulatory Visit

## 2022-09-03 DIAGNOSIS — Z1231 Encounter for screening mammogram for malignant neoplasm of breast: Secondary | ICD-10-CM

## 2022-09-08 DIAGNOSIS — Z1231 Encounter for screening mammogram for malignant neoplasm of breast: Secondary | ICD-10-CM

## 2022-09-15 ENCOUNTER — Encounter: Payer: Self-pay | Admitting: Family Medicine

## 2022-09-20 ENCOUNTER — Other Ambulatory Visit (HOSPITAL_COMMUNITY): Payer: Self-pay

## 2022-09-20 ENCOUNTER — Other Ambulatory Visit: Payer: Self-pay | Admitting: Family Medicine

## 2022-09-20 ENCOUNTER — Encounter: Payer: Self-pay | Admitting: Family Medicine

## 2022-09-20 DIAGNOSIS — I700 Atherosclerosis of aorta: Secondary | ICD-10-CM

## 2022-09-20 DIAGNOSIS — E785 Hyperlipidemia, unspecified: Secondary | ICD-10-CM

## 2022-09-20 MED ORDER — SIMVASTATIN 40 MG PO TABS
40.0000 mg | ORAL_TABLET | Freq: Every evening | ORAL | 1 refills | Status: DC
  Filled 2022-09-20: qty 90, 90d supply, fill #0
  Filled 2022-12-13: qty 90, 90d supply, fill #1

## 2022-09-20 MED ORDER — PROMETHAZINE HCL 25 MG PO TABS
25.0000 mg | ORAL_TABLET | Freq: Three times a day (TID) | ORAL | 0 refills | Status: DC | PRN
  Filled 2022-09-20: qty 30, 10d supply, fill #0

## 2022-09-20 NOTE — Telephone Encounter (Signed)
Is this okay to refill? 

## 2022-09-28 ENCOUNTER — Ambulatory Visit (INDEPENDENT_AMBULATORY_CARE_PROVIDER_SITE_OTHER): Payer: No Typology Code available for payment source | Admitting: Physician Assistant

## 2022-09-28 ENCOUNTER — Encounter (INDEPENDENT_AMBULATORY_CARE_PROVIDER_SITE_OTHER): Payer: Self-pay | Admitting: Physician Assistant

## 2022-09-28 ENCOUNTER — Other Ambulatory Visit (HOSPITAL_COMMUNITY): Payer: Self-pay

## 2022-09-28 VITALS — BP 113/75 | HR 60 | Temp 98.2°F | Ht 63.0 in | Wt 223.0 lb

## 2022-09-28 DIAGNOSIS — E559 Vitamin D deficiency, unspecified: Secondary | ICD-10-CM | POA: Diagnosis not present

## 2022-09-28 DIAGNOSIS — E669 Obesity, unspecified: Secondary | ICD-10-CM | POA: Diagnosis not present

## 2022-09-28 DIAGNOSIS — Z7984 Long term (current) use of oral hypoglycemic drugs: Secondary | ICD-10-CM

## 2022-09-28 DIAGNOSIS — Z7985 Long-term (current) use of injectable non-insulin antidiabetic drugs: Secondary | ICD-10-CM

## 2022-09-28 DIAGNOSIS — Z6839 Body mass index (BMI) 39.0-39.9, adult: Secondary | ICD-10-CM | POA: Diagnosis not present

## 2022-09-28 DIAGNOSIS — E1169 Type 2 diabetes mellitus with other specified complication: Secondary | ICD-10-CM

## 2022-09-28 MED ORDER — OZEMPIC (2 MG/DOSE) 8 MG/3ML ~~LOC~~ SOPN
2.0000 mg | PEN_INJECTOR | SUBCUTANEOUS | 0 refills | Status: DC
  Filled 2022-09-28: qty 3, 28d supply, fill #0

## 2022-10-03 ENCOUNTER — Other Ambulatory Visit: Payer: Self-pay | Admitting: Family Medicine

## 2022-10-03 DIAGNOSIS — E119 Type 2 diabetes mellitus without complications: Secondary | ICD-10-CM

## 2022-10-04 ENCOUNTER — Other Ambulatory Visit (HOSPITAL_COMMUNITY): Payer: Self-pay

## 2022-10-04 MED ORDER — METFORMIN HCL ER 500 MG PO TB24
1000.0000 mg | ORAL_TABLET | Freq: Two times a day (BID) | ORAL | 1 refills | Status: DC
  Filled 2022-10-04: qty 360, 90d supply, fill #0
  Filled 2022-12-28: qty 360, 90d supply, fill #1

## 2022-10-04 MED ORDER — CYCLOBENZAPRINE HCL 10 MG PO TABS
10.0000 mg | ORAL_TABLET | Freq: Three times a day (TID) | ORAL | 0 refills | Status: DC | PRN
  Filled 2022-10-04: qty 30, 10d supply, fill #0

## 2022-10-04 NOTE — Telephone Encounter (Signed)
Is this okay to refill? 

## 2022-10-05 ENCOUNTER — Other Ambulatory Visit (HOSPITAL_COMMUNITY): Payer: Self-pay

## 2022-10-13 NOTE — Progress Notes (Unsigned)
Chief Complaint:   OBESITY Cindy Carey is here to discuss her progress with her obesity treatment plan along with follow-up of her obesity related diagnoses. Cindy Carey is on {MWMwtlossportion/plan2:23431} and states she is following her eating plan approximately ***% of the time. Cindy Carey states she is *** *** minutes *** times per week.  Today's visit was #: *** Starting weight: *** Starting date: *** Today's weight: 223 lbs Today's date: 09/28/2022 Total lbs lost to date: *** Total lbs lost since last in-office visit: ***  Interim History: ***  Subjective:   1. Type 2 diabetes mellitus with other specified complication, without long-term current use of insulin (HCC) ***  2. Vitamin D deficiency ***  Assessment/Plan:   1. Type 2 diabetes mellitus with other specified complication, without long-term current use of insulin (HCC) *** - Semaglutide, 2 MG/DOSE, (OZEMPIC, 2 MG/DOSE,) 8 MG/3ML SOPN; Inject 2 mg as directed once a week.  Dispense: 3 mL; Refill: 0  2. Vitamin D deficiency ***  3. Obesity, current BMI 39.5 ***  Cindy Carey {CHL AMB IS/IS NOT:210130109} currently in the action stage of change. As such, her goal is to {MWMwtloss#1:210800005}. She has agreed to {MWMwtlossportion/plan2:23431}.   Exercise goals: {MWM EXERCISE RECS:23473}  Behavioral modification strategies: {MWMwtlossdietstrategies3:23432}.  Cindy Carey has agreed to follow-up with our clinic in {NUMBER 1-10:22536} weeks. She was informed of the importance of frequent follow-up visits to maximize her success with intensive lifestyle modifications for her multiple health conditions.   ***delete paragraph if no labs orderedTina was informed we would discuss her lab results at her next visit unless there is a critical issue that needs to be addressed sooner. Cindy Carey agreed to keep her next visit at the agreed upon time to discuss these results.  Objective:   Blood pressure 113/75, pulse 60, temperature 98.2 F (36.8 C), height  '5\' 3"'$  (1.6 m), weight 223 lb (101.2 kg), last menstrual period 10/18/1982, SpO2 97 %. Body mass index is 39.5 kg/m.  General: Cooperative, alert, well developed, in no acute distress. HEENT: Conjunctivae and lids unremarkable. Cardiovascular: Regular rhythm.  Lungs: Normal work of breathing. Neurologic: No focal deficits.   Lab Results  Component Value Date   CREATININE 0.75 08/03/2022   BUN 13 08/03/2022   NA 142 08/03/2022   K 5.0 08/03/2022   CL 104 08/03/2022   CO2 22 08/03/2022   Lab Results  Component Value Date   ALT 20 08/03/2022   AST 31 08/03/2022   ALKPHOS 118 08/03/2022   BILITOT 0.7 08/03/2022   Lab Results  Component Value Date   HGBA1C 5.4 08/03/2022   HGBA1C 5.8 (H) 12/14/2021   HGBA1C 6.4 (A) 06/08/2021   HGBA1C 7.8 (A) 03/09/2021   HGBA1C 7.0 (A) 07/09/2020   Lab Results  Component Value Date   INSULIN 39.2 (H) 08/03/2022   INSULIN 76.3 (H) 12/14/2021   INSULIN 98.4 (H) 05/20/2021   INSULIN 40.3 (H) 03/23/2019   INSULIN 59.1 (H) 07/13/2018   Lab Results  Component Value Date   TSH 1.490 03/10/2022   Lab Results  Component Value Date   CHOL 132 08/03/2022   HDL 67 08/03/2022   LDLCALC 46 08/03/2022   TRIG 103 08/03/2022   CHOLHDL 2.0 08/03/2022   Lab Results  Component Value Date   VD25OH 56.6 08/03/2022   VD25OH 41.4 03/23/2019   VD25OH 48.7 12/14/2018   Lab Results  Component Value Date   WBC 6.4 03/10/2022   HGB 12.3 03/10/2022   HCT 37.5 03/10/2022  MCV 84 03/10/2022   PLT 81 (LL) 03/10/2022   Lab Results  Component Value Date   IRON 84 12/07/2017    Obesity Behavioral Intervention:   Approximately 15 minutes were spent on the discussion below.  ASK: We discussed the diagnosis of obesity with Cindy Carey today and Cindy Carey agreed to give [name] permission to discuss obesity behavioral modification therapy today.  ASSESS: Cindy Carey has the diagnosis of obesity and her BMI today is ***. Margarethe {ACTION; IS/IS BFX:83291916} in the action  stage of change.   ADVISE: Cindy Carey was educated on the multiple health risks of obesity as well as the benefit of weight loss to improve her health. She was advised of the need for long term treatment and the importance of lifestyle modifications to improve her current health and to decrease her risk of future health problems.  AGREE: Multiple dietary modification options and treatment options were discussed and Cindy Carey agreed to follow the recommendations documented in the above note.  ARRANGE: Cindy Carey was educated on the importance of frequent visits to treat obesity as outlined per CMS and USPSTF guidelines and agreed to schedule her next follow up appointment today.  Attestation Statements:   Reviewed by clinician on day of visit: allergies, medications, problem list, medical history, surgical history, family history, social history, and previous encounter notes.  ***(delete if time-based billing not used)Time spent on visit including pre-visit chart review and post-visit care and charting was *** minutes.   I, ***, am acting as transcriptionist for ***.  I have reviewed the above documentation for accuracy and completeness, and I agree with the above. -  ***

## 2022-10-25 NOTE — Progress Notes (Unsigned)
TeleHealth Visit:  This visit was completed with telemedicine (audio/video) technology. Cindy Carey has verbally consented to this TeleHealth visit. The patient is located at home, the provider is located at home. The participants in this visit include the listed provider and patient. The visit was conducted today via MyChart video.  OBESITY Cindy Carey is here to discuss her progress with her obesity treatment plan along with follow-up of her obesity related diagnoses.   Today's visit was # 21 Starting weight: 251 lbs Starting date: 05/20/2021 Weight at last in office visit: 223 lbs on 09/28/22 Total weight loss: 28 lbs at last in office visit on 09/28/22. Today's reported weight: 226 lbs   Current exercise:  [employer] (treadmill, bicycle) 30 minutes 4 times per week.  Interim History:  Cindy Carey reports she had several holiday celebrations, especially at [employer].  She is now back to plan.  Packs both breakfast and lunch for work.  Cooks dinner at home.  Reports appetite is fairly well-controlled.  She says the GLP-1's and Mounjaro have never helped with her appetite much.  She plans to start doing some weightlifting at the gym soon.  Assessment/Plan:  1. Type II Diabetes HgbA1c is at goal. Last A1c was 5.4 on 08/03/2022 CBGs: Fasting 84, 2 hour PP 110  Episodes of hypoglycemia: Has had a few blood sugars in the 60s when it was mealtime. Medication(s): Jardiance 25 mg daily, metformin 1000 mg twice daily, Ozempic 2 mg weekly  Lab Results  Component Value Date   HGBA1C 5.4 08/03/2022   HGBA1C 5.8 (H) 12/14/2021   HGBA1C 6.4 (A) 06/08/2021   Lab Results  Component Value Date   MICROALBUR 0.3 03/31/2016   LDLCALC 46 08/03/2022   CREATININE 0.75 08/03/2022    Plan: Refill Ozempic 2 mg weekly. Continue Jardiance 25 mg daily and metformin 1000 mg twice daily.   2. Obesity: Current BMI 39.6 Cindy Carey is currently in the action stage of change. As such, her goal is to continue with  weight loss efforts.  She has agreed to the Category 2 Plan.   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  Behavioral modification strategies: increasing lean protein intake, decreasing simple carbohydrates, and planning for success.  Cindy Carey has agreed to follow-up with our clinic in 5 weeks.   No orders of the defined types were placed in this encounter.   Medications Discontinued During This Encounter  Medication Reason   Semaglutide, 2 MG/DOSE, (OZEMPIC, 2 MG/DOSE,) 8 MG/3ML SOPN Reorder     Meds ordered this encounter  Medications   Semaglutide, 2 MG/DOSE, (OZEMPIC, 2 MG/DOSE,) 8 MG/3ML SOPN    Sig: Inject 2 mg as directed once a week.    Dispense:  3 mL    Refill:  0    Order Specific Question:   Supervising Provider    Answer:   Dell Ponto [[id number]]      Objective:   VITALS: Per patient if applicable, see vitals. GENERAL: Alert and in no acute distress. CARDIOPULMONARY: No increased WOB. Speaking in clear sentences.  PSYCH: Pleasant and cooperative. Speech normal rate and rhythm. Affect is appropriate. Insight and judgement are appropriate. Attention is focused, linear, and appropriate.  NEURO: Oriented as arrived to appointment on time with no prompting.   Lab Results  Component Value Date   CREATININE 0.75 08/03/2022   BUN 13 08/03/2022   NA 142 08/03/2022   K 5.0 08/03/2022   CL 104 08/03/2022   CO2 22 08/03/2022  Lab Results  Component Value Date   ALT 20 08/03/2022   AST 31 08/03/2022   ALKPHOS 118 08/03/2022   BILITOT 0.7 08/03/2022   Lab Results  Component Value Date   HGBA1C 5.4 08/03/2022   HGBA1C 5.8 (H) 12/14/2021   HGBA1C 6.4 (A) 06/08/2021   HGBA1C 7.8 (A) 03/09/2021   HGBA1C 7.0 (A) 07/09/2020   Lab Results  Component Value Date   INSULIN 39.2 (H) 08/03/2022   INSULIN 76.3 (H) 12/14/2021   INSULIN 98.4 (H) 05/20/2021   INSULIN 40.3 (H) 03/23/2019   INSULIN 59.1 (H) 07/13/2018   Lab Results  Component  Value Date   TSH 1.490 03/10/2022   Lab Results  Component Value Date   CHOL 132 08/03/2022   HDL 67 08/03/2022   LDLCALC 46 08/03/2022   TRIG 103 08/03/2022   CHOLHDL 2.0 08/03/2022   Lab Results  Component Value Date   WBC 6.4 03/10/2022   HGB 12.3 03/10/2022   HCT 37.5 03/10/2022   MCV 84 03/10/2022   PLT 81 (LL) 03/10/2022   Lab Results  Component Value Date   IRON 84 12/07/2017   Lab Results  Component Value Date   VD25OH 56.6 08/03/2022   VD25OH 41.4 03/23/2019   VD25OH 48.7 12/14/2018    Attestation Statements:   Reviewed by clinician on day of visit: allergies, medications, problem list, medical history, surgical history, family history, social history, and previous encounter notes.

## 2022-10-26 ENCOUNTER — Encounter (INDEPENDENT_AMBULATORY_CARE_PROVIDER_SITE_OTHER): Payer: Self-pay | Admitting: Family Medicine

## 2022-10-26 ENCOUNTER — Other Ambulatory Visit (HOSPITAL_COMMUNITY): Payer: Self-pay

## 2022-10-26 ENCOUNTER — Telehealth (INDEPENDENT_AMBULATORY_CARE_PROVIDER_SITE_OTHER): Payer: Commercial Managed Care - PPO | Admitting: Family Medicine

## 2022-10-26 DIAGNOSIS — E669 Obesity, unspecified: Secondary | ICD-10-CM

## 2022-10-26 DIAGNOSIS — Z7984 Long term (current) use of oral hypoglycemic drugs: Secondary | ICD-10-CM

## 2022-10-26 DIAGNOSIS — Z6839 Body mass index (BMI) 39.0-39.9, adult: Secondary | ICD-10-CM

## 2022-10-26 DIAGNOSIS — E1169 Type 2 diabetes mellitus with other specified complication: Secondary | ICD-10-CM | POA: Diagnosis not present

## 2022-10-26 MED ORDER — OZEMPIC (2 MG/DOSE) 8 MG/3ML ~~LOC~~ SOPN
2.0000 mg | PEN_INJECTOR | SUBCUTANEOUS | 0 refills | Status: DC
  Filled 2022-10-26: qty 3, 28d supply, fill #0

## 2022-10-30 ENCOUNTER — Other Ambulatory Visit (HOSPITAL_COMMUNITY): Payer: Self-pay

## 2022-11-08 DIAGNOSIS — H5203 Hypermetropia, bilateral: Secondary | ICD-10-CM | POA: Diagnosis not present

## 2022-11-08 LAB — HM DIABETES EYE EXAM

## 2022-11-16 DIAGNOSIS — L814 Other melanin hyperpigmentation: Secondary | ICD-10-CM | POA: Diagnosis not present

## 2022-11-16 DIAGNOSIS — L821 Other seborrheic keratosis: Secondary | ICD-10-CM | POA: Diagnosis not present

## 2022-11-16 DIAGNOSIS — D225 Melanocytic nevi of trunk: Secondary | ICD-10-CM | POA: Diagnosis not present

## 2022-11-16 DIAGNOSIS — L570 Actinic keratosis: Secondary | ICD-10-CM | POA: Diagnosis not present

## 2022-11-22 ENCOUNTER — Other Ambulatory Visit (INDEPENDENT_AMBULATORY_CARE_PROVIDER_SITE_OTHER): Payer: Self-pay | Admitting: Family Medicine

## 2022-11-22 DIAGNOSIS — E1169 Type 2 diabetes mellitus with other specified complication: Secondary | ICD-10-CM

## 2022-11-24 ENCOUNTER — Encounter (INDEPENDENT_AMBULATORY_CARE_PROVIDER_SITE_OTHER): Payer: Self-pay | Admitting: Family Medicine

## 2022-11-24 ENCOUNTER — Other Ambulatory Visit (HOSPITAL_COMMUNITY): Payer: Self-pay

## 2022-11-24 DIAGNOSIS — E1169 Type 2 diabetes mellitus with other specified complication: Secondary | ICD-10-CM

## 2022-11-24 MED ORDER — OZEMPIC (2 MG/DOSE) 8 MG/3ML ~~LOC~~ SOPN
2.0000 mg | PEN_INJECTOR | SUBCUTANEOUS | 0 refills | Status: DC
  Filled 2022-11-24: qty 3, 28d supply, fill #0

## 2022-11-29 ENCOUNTER — Other Ambulatory Visit (HOSPITAL_COMMUNITY): Payer: Self-pay

## 2022-11-29 ENCOUNTER — Ambulatory Visit (INDEPENDENT_AMBULATORY_CARE_PROVIDER_SITE_OTHER): Payer: Commercial Managed Care - PPO | Admitting: Physician Assistant

## 2022-11-29 ENCOUNTER — Encounter (INDEPENDENT_AMBULATORY_CARE_PROVIDER_SITE_OTHER): Payer: Self-pay | Admitting: Physician Assistant

## 2022-11-29 VITALS — BP 110/66 | HR 66 | Temp 98.0°F | Ht 63.0 in | Wt 225.0 lb

## 2022-11-29 DIAGNOSIS — Z7985 Long-term (current) use of injectable non-insulin antidiabetic drugs: Secondary | ICD-10-CM

## 2022-11-29 DIAGNOSIS — Z6841 Body Mass Index (BMI) 40.0 and over, adult: Secondary | ICD-10-CM

## 2022-11-29 DIAGNOSIS — E1169 Type 2 diabetes mellitus with other specified complication: Secondary | ICD-10-CM

## 2022-11-29 DIAGNOSIS — E669 Obesity, unspecified: Secondary | ICD-10-CM | POA: Diagnosis not present

## 2022-11-29 DIAGNOSIS — Z7984 Long term (current) use of oral hypoglycemic drugs: Secondary | ICD-10-CM | POA: Diagnosis not present

## 2022-11-29 MED ORDER — OZEMPIC (2 MG/DOSE) 8 MG/3ML ~~LOC~~ SOPN
2.0000 mg | PEN_INJECTOR | SUBCUTANEOUS | 0 refills | Status: DC
  Filled 2022-11-29 – 2022-12-17 (×3): qty 3, 28d supply, fill #0

## 2022-11-29 NOTE — Progress Notes (Signed)
Chief Complaint:   OBESITY Cindy Carey is here to discuss her progress with her obesity treatment plan along with follow-up of her obesity related diagnoses. Cindy Carey is on the Category 2 Plan and states she is following her eating plan approximately 75% of the time. Cindy Carey states she is Going to the gym/biking and walking  30-45 minutes 3-4 times per week.  Today's visit was #: 22 Starting weight: 251 lbs Starting date: 05/20/2021 Today's weight: 225 lbs Today's date: 11/29/2022 Total lbs lost to date: 26 lbs Total lbs lost since last in-office visit: +2 lb  Interim History: Cindy Carey has done well with weight loss overall. Reports Ozempic does not help as much with hunger/appetite compared to [facility], but she had a localized skin reaction with Mounjaro and it had to be discontinued. No side effects with Ozempic.  She is trying to focus on meeting protein goals. Breakfast- [employer] yogurt, oatmeal  Lunch- [place] sandwich, fruit and yogurt.  Snack- Fruit and cheese sticks Dinner- Chicken strips and salad  Is exercising regularly, but has not been strengthening as much and we discussed working on some gentle strengthening for 10 minutes 3 times weekly.    Subjective:   1. Type 2 diabetes mellitus with other specified complication, without long-term current use of insulin (HCC) A1c 5.4- at goal 08/03/22. On Ozempic 2 mg weekly without side effects and metformin 1000 mg twice daily and Jardiance 25 mg daily without side effects. Insulin 39.2- not at goal.  She is working on decreasing simple carbohydrates, increasing lean protein and exercise to promote weight loss and improve glycemic control.   2. BMI 40.0-44.9, adult ([place]), Current BMI 40.0  3. Obesity ([place])- Start BMI 46.41   Assessment/Plan:   1. Type 2 diabetes mellitus with other specified complication, without long-term current use of insulin ([place]) Continue working on nutrition plan to decrease simple carbohydrates, increase lean protein  and exercise to promote weight loss and improve glycemic control.  - Semaglutide, 2 MG/DOSE, (OZEMPIC, 2 MG/DOSE,) 8 MG/3ML SOPN; Inject 2 mg as directed once a week.  Dispense: 3 mL; Refill: 0  2. BMI 40.0-44.9, adult ([place]), Current BMI 40.0   3. Obesity ([place])- Start BMI 46.41   Cindy Carey is currently in the action stage of change. As such, her goal is to continue with weight loss efforts. She has agreed to the Category 2 Plan.   Exercise goals: All adults should avoid inactivity. Some physical activity is better than none, and adults who participate in any amount of physical activity gain some health benefits. Add strengthening for 10 minutes 3 times weekly  Behavioral modification strategies: increasing lean protein intake, decreasing simple carbohydrates, no skipping meals, and planning for success.  Cindy Carey has agreed to follow-up with our clinic in 4 weeks. She was informed of the importance of frequent follow-up visits to maximize her success with intensive lifestyle modifications for her multiple health conditions.   Objective:   Blood pressure 110/66, pulse 66, temperature 98 F (36.7 C), height 5' 3"$  (1.6 m), weight 225 lb (102.1 kg), last menstrual period 10/18/1982, SpO2 96 %. Body mass index is 39.86 kg/m.  General: Cooperative, alert, well developed, in no acute distress. HEENT: Conjunctivae and lids unremarkable. Cardiovascular: Regular rhythm.  Lungs: Normal work of breathing. Neurologic: No focal deficits.   Lab Results  Component Value Date   CREATININE 0.75 08/03/2022   BUN 13 08/03/2022   NA 142 08/03/2022   K 5.0 08/03/2022   CL 104 08/03/2022  CO2 22 08/03/2022   Lab Results  Component Value Date   ALT 20 08/03/2022   AST 31 08/03/2022   ALKPHOS 118 08/03/2022   BILITOT 0.7 08/03/2022   Lab Results  Component Value Date   HGBA1C 5.4 08/03/2022   HGBA1C 5.8 (H) 12/14/2021   HGBA1C 6.4 (A) 06/08/2021   HGBA1C 7.8 (A) 03/09/2021   HGBA1C 7.0 (A)  07/09/2020   Lab Results  Component Value Date   INSULIN 39.2 (H) 08/03/2022   INSULIN 76.3 (H) 12/14/2021   INSULIN 98.4 (H) 05/20/2021   INSULIN 40.3 (H) 03/23/2019   INSULIN 59.1 (H) 07/13/2018   Lab Results  Component Value Date   TSH 1.490 03/10/2022   Lab Results  Component Value Date   CHOL 132 08/03/2022   HDL 67 08/03/2022   LDLCALC 46 08/03/2022   TRIG 103 08/03/2022   CHOLHDL 2.0 08/03/2022   Lab Results  Component Value Date   VD25OH 56.6 08/03/2022   VD25OH 41.4 03/23/2019   VD25OH 48.7 12/14/2018   Lab Results  Component Value Date   WBC 6.4 03/10/2022   HGB 12.3 03/10/2022   HCT 37.5 03/10/2022   MCV 84 03/10/2022   PLT 81 (LL) 03/10/2022   Lab Results  Component Value Date   IRON 84 12/07/2017    Obesity Behavioral Intervention:   Approximately 15 minutes were spent on the discussion below.  ASK: We discussed the diagnosis of obesity with Cindy Carey today and Cindy Carey agreed to give [name] permission to discuss obesity behavioral modification therapy today.  ASSESS: Cindy Carey has the diagnosis of obesity and her BMI today is 40.0. Cindy Carey is in the action stage of change.   ADVISE: Cindy Carey was educated on the multiple health risks of obesity as well as the benefit of weight loss to improve her health. She was advised of the need for long term treatment and the importance of lifestyle modifications to improve her current health and to decrease her risk of future health problems.  AGREE: Multiple dietary modification options and treatment options were discussed and Cindy Carey agreed to follow the recommendations documented in the above note.  ARRANGE: Cindy Carey was educated on the importance of frequent visits to treat obesity as outlined per CMS and USPSTF guidelines and agreed to schedule her next follow up appointment today.  Attestation Statements:   Reviewed by clinician on day of visit: allergies, medications, problem list, medical history, surgical history, family  history, social history, and previous encounter notes.  Emilea Goga,PA-C

## 2022-12-02 ENCOUNTER — Other Ambulatory Visit (HOSPITAL_COMMUNITY): Payer: Self-pay

## 2022-12-14 ENCOUNTER — Other Ambulatory Visit: Payer: Self-pay

## 2022-12-16 ENCOUNTER — Other Ambulatory Visit (HOSPITAL_COMMUNITY): Payer: Self-pay

## 2022-12-16 ENCOUNTER — Other Ambulatory Visit: Payer: Self-pay

## 2022-12-17 ENCOUNTER — Other Ambulatory Visit (HOSPITAL_COMMUNITY): Payer: Self-pay

## 2022-12-17 ENCOUNTER — Other Ambulatory Visit: Payer: Self-pay

## 2022-12-23 NOTE — Progress Notes (Unsigned)
  TeleHealth Visit:  This visit was completed with telemedicine (audio/video) technology. [name] has verbally consented to this TeleHealth visit. The patient is located at home, the provider is located at home. The participants in this visit include the listed provider and patient. The visit was conducted today via MyChart video.  OBESITY Cindy Carey is here to discuss her progress with her obesity treatment plan along with follow-up of her obesity related diagnoses.   Today's visit was # 23 Starting weight: 251 lbs Starting date: 05/20/2021 Weight at last in office visit: 225 lbs on 11/29/22 Total weight loss: 26 lbs at last in office visit on 11/29/22. Today's reported weight: *** lbs No weight reported.  Nutrition Plan: the Category 2 plan - ***% adherence.  Current exercise: {exercise types:16438} Going to the gym/biking and walking  30-45 minutes 3-4 times per week.  Interim History:  ***  Pharmacotherapy: [name] is on {dwwpharmacotherapy:29109} Adverse side effects: {dwwse:29122} Hunger is {EWCONTROLASSESSMENT:24261}.  Cravings are {EWCONTROLASSESSMENT:24261}.  Assessment/Plan:  1. ***  2. ***  3. ***  Morbid Obesity: Current BMI 39 Pharmacotherapy Plan {dwwmed:29123}  {dwwpharmacotherapy:29109} [name] {CHL AMB IS/IS NOT:210130109} currently in the action stage of change. As such, her goal is to {MWMwtloss#1:210800005}.  She has agreed to {dwwsldiets:29085}.  Exercise goals: {MWM EXERCISE RECS:23473}  Behavioral modification strategies: {dwwslwtlossstrategies:29088}.  [name] has agreed to follow-up with our clinic in {NUMBER 1-10:22536} weeks.   No orders of the defined types were placed in this encounter.   There are no discontinued medications.   No orders of the defined types were placed in this encounter.     Objective:   VITALS: Per patient if applicable, see vitals. GENERAL: Alert and in no acute distress. CARDIOPULMONARY: No increased WOB. Speaking in  clear sentences.  PSYCH: Pleasant and cooperative. Speech normal rate and rhythm. Affect is appropriate. Insight and judgement are appropriate. Attention is focused, linear, and appropriate.  NEURO: Oriented as arrived to appointment on time with no prompting.   Attestation Statements:   Reviewed by clinician on day of visit: allergies, medications, problem list, medical history, surgical history, family history, social history, and previous encounter notes.  ***(delete if time-based billing not used) Time spent on visit including the items listed below was *** minutes.  -preparing to see the patient (e.g., review of tests, history, previous notes) -obtaining and/or reviewing separately obtained history -counseling and educating the patient/family/caregiver -documenting clinical information in the electronic or other health record -ordering medications, tests, or procedures -independently interpreting results and communicating results to the patient/ family/caregiver -referring and communicating with other health care professionals  -care coordination   This was prepared with the assistance of Presenter, broadcasting.  Occasional wrong-word or sound-a-like substitutions may have occurred due to the inherent limitations of voice recognition software.

## 2022-12-27 ENCOUNTER — Telehealth (INDEPENDENT_AMBULATORY_CARE_PROVIDER_SITE_OTHER): Payer: Commercial Managed Care - PPO | Admitting: Family Medicine

## 2022-12-27 ENCOUNTER — Encounter (INDEPENDENT_AMBULATORY_CARE_PROVIDER_SITE_OTHER): Payer: Self-pay | Admitting: Family Medicine

## 2022-12-27 ENCOUNTER — Other Ambulatory Visit (HOSPITAL_COMMUNITY): Payer: Self-pay

## 2022-12-27 DIAGNOSIS — Z6839 Body mass index (BMI) 39.0-39.9, adult: Secondary | ICD-10-CM | POA: Diagnosis not present

## 2022-12-27 DIAGNOSIS — E785 Hyperlipidemia, unspecified: Secondary | ICD-10-CM

## 2022-12-27 DIAGNOSIS — Z7984 Long term (current) use of oral hypoglycemic drugs: Secondary | ICD-10-CM

## 2022-12-27 DIAGNOSIS — Z7985 Long-term (current) use of injectable non-insulin antidiabetic drugs: Secondary | ICD-10-CM

## 2022-12-27 DIAGNOSIS — E1169 Type 2 diabetes mellitus with other specified complication: Secondary | ICD-10-CM | POA: Diagnosis not present

## 2022-12-27 MED ORDER — OZEMPIC (2 MG/DOSE) 8 MG/3ML ~~LOC~~ SOPN
2.0000 mg | PEN_INJECTOR | SUBCUTANEOUS | 0 refills | Status: DC
  Filled 2022-12-27 – 2023-01-15 (×2): qty 3, 28d supply, fill #0

## 2023-01-17 ENCOUNTER — Other Ambulatory Visit (HOSPITAL_COMMUNITY): Payer: Self-pay

## 2023-01-31 ENCOUNTER — Ambulatory Visit (INDEPENDENT_AMBULATORY_CARE_PROVIDER_SITE_OTHER): Payer: Commercial Managed Care - PPO | Admitting: Physician Assistant

## 2023-01-31 NOTE — Progress Notes (Signed)
TeleHealth Visit:  This visit was completed with telemedicine (audio/video) technology. Cindy Carey has verbally consented to this TeleHealth visit. The patient is located at home, the provider is located at home. The participants in this visit include the listed provider and patient. The visit was conducted today via MyChart video.  OBESITY Cindy Carey is here to discuss her progress with her obesity treatment plan along with follow-up of her obesity related diagnoses.   Today's visit was # 24 Starting weight: 251 lbs Starting date: 05/20/2021 Weight at last in office visit: 225 lbs on 11/29/22 Total weight loss: 26 lbs at last in office visit on 11/29/22. Today's reported weight (02/01/23): none reported   Nutrition Plan: the Category 2 plan and keeping a food journal with goal of 1200-1300 calories and 85 grams of protein daily   Current exercise:  Going to the gym (weights /cardio) 60 minutes 3-4 times per week and walking  the dog for 30 minutes daily  Interim History:  Recently went on vacation and is up two pounds. She has been journaling and is keeping calories and protein at goal.  She is very frustrated-weight has been plateaued since October 2023. She recently increased her workouts at the gym to an hour. She was on [facility] from August 2022 to August 2023 but it was stopped due to skin reaction with her last 2 injections.  She lost about 20 pounds during this time.  She was switched to Ozempic and weight loss has been stalled.   Assessment/Plan:  1. Type 2 Diabetes Mellitus with other specified complication, without long-term current use of insulin HgbA1c is at goal. Last A1c was 5.4 on 08/03/2022.  She is scheduled for labs/CPE at her PCP in May. Medication(s): Ozempic 2 mg SQ weekly, Jardiance 25 mg daily with breakfast, metformin XR 1000 mg twice daily  Lab Results  Component Value Date   HGBA1C 5.4 08/03/2022   HGBA1C 5.8 (H) 12/14/2021   HGBA1C 6.4 (A) 06/08/2021   Lab  Results  Component Value Date   MICROALBUR 0.3 03/31/2016   LDLCALC 46 08/03/2022   CREATININE 0.75 08/03/2022   No results found for: "GFR"  Plan: Start Mounjaro 15 mg SQ weekly Discontinue Ozempic 2 mg weekly. Continue Jardiance and metformin at current dosages.   2. Hyperlipidemia associated with type 2 diabetes mellitus LDL is at goal.  Lipid profile on 08/03/2022: LDL at goal-46, HDL normal at 67, triglycerides normal at 103. 10-year ASCVD risk score 7.8%. Medication(s): Zocor 40 mg daily.  Lab Results  Component Value Date   CHOL 132 08/03/2022   HDL 67 08/03/2022   LDLCALC 46 08/03/2022   TRIG 103 08/03/2022   CHOLHDL 2.0 08/03/2022   CHOLHDL 1.9 03/09/2021   CHOLHDL 1.7 12/20/2019   Lab Results  Component Value Date   ALT 20 08/03/2022   AST 31 08/03/2022   ALKPHOS 118 08/03/2022   BILITOT 0.7 08/03/2022   The 10-year ASCVD risk score (Arnett DK, et al., 2019) is: 7.8%   Values used to calculate the score:     Age: 67 years     Sex: Female     Is Non-Hispanic African American: No     Diabetic: Yes     Tobacco smoker: No     Systolic Blood Pressure: 110 mmHg     Is BP treated: Yes     HDL Cholesterol: 67 mg/dL     Total Cholesterol: 132 mg/dL  Plan: Continue Zocor 40 mg daily. Continue to work on [employer]  loss and continue regular exercise.  3. Morbid Obesity: Current BMI 39 Cindy Carey is currently in the action stage of change. As such, her goal is to continue with weight loss efforts.  She has agreed to low carbohydrate plan.  1.  Change to low-carb plan. 2.  May have one 2 Good yogurt daily on the low-carb plan. 3.  Low-carb plan sent via MyChart message.  Exercise goals:  as is  Behavioral modification strategies: increasing lean protein intake, decreasing simple carbohydrates , and planning for success.  Cindy Carey has agreed to follow-up with our clinic in 4 weeks.   No orders of the defined types were placed in this encounter.   There are no  discontinued medications.   Meds ordered this encounter  Medications   tirzepatide (MOUNJARO) 15 MG/0.5ML Pen    Sig: Inject 15 mg into the skin once a week.    Dispense:  2 mL    Refill:  0    Order Specific Question:   Supervising Provider    Answer:   Glennis Brink [[id number]]      Objective:   VITALS: Per patient if applicable, see vitals. GENERAL: Alert and in no acute distress. CARDIOPULMONARY: No increased WOB. Speaking in clear sentences.  PSYCH: Pleasant and cooperative. Speech normal rate and rhythm. Affect is appropriate. Insight and judgement are appropriate. Attention is focused, linear, and appropriate.  NEURO: Oriented as arrived to appointment on time with no prompting.   Attestation Statements:   Reviewed by clinician on day of visit: allergies, medications, problem list, medical history, surgical history, family history, social history, and previous encounter notes. This was prepared with the assistance of Engineer, civil (consulting).  Occasional wrong-word or sound-a-like substitutions may have occurred due to the inherent limitations of voice recognition software.

## 2023-02-01 ENCOUNTER — Encounter (INDEPENDENT_AMBULATORY_CARE_PROVIDER_SITE_OTHER): Payer: Self-pay | Admitting: Family Medicine

## 2023-02-01 ENCOUNTER — Other Ambulatory Visit (HOSPITAL_COMMUNITY): Payer: Self-pay

## 2023-02-01 ENCOUNTER — Telehealth (INDEPENDENT_AMBULATORY_CARE_PROVIDER_SITE_OTHER): Payer: Commercial Managed Care - PPO | Admitting: Family Medicine

## 2023-02-01 DIAGNOSIS — E1169 Type 2 diabetes mellitus with other specified complication: Secondary | ICD-10-CM | POA: Diagnosis not present

## 2023-02-01 DIAGNOSIS — Z7985 Long-term (current) use of injectable non-insulin antidiabetic drugs: Secondary | ICD-10-CM | POA: Diagnosis not present

## 2023-02-01 DIAGNOSIS — E785 Hyperlipidemia, unspecified: Secondary | ICD-10-CM

## 2023-02-01 DIAGNOSIS — Z7984 Long term (current) use of oral hypoglycemic drugs: Secondary | ICD-10-CM | POA: Diagnosis not present

## 2023-02-01 DIAGNOSIS — Z6839 Body mass index (BMI) 39.0-39.9, adult: Secondary | ICD-10-CM | POA: Diagnosis not present

## 2023-02-01 MED ORDER — TIRZEPATIDE 15 MG/0.5ML ~~LOC~~ SOAJ
15.0000 mg | SUBCUTANEOUS | 0 refills | Status: DC
  Filled 2023-02-01: qty 2, 28d supply, fill #0

## 2023-02-03 ENCOUNTER — Other Ambulatory Visit (HOSPITAL_COMMUNITY): Payer: Self-pay

## 2023-02-23 ENCOUNTER — Ambulatory Visit: Payer: No Typology Code available for payment source | Admitting: Obstetrics and Gynecology

## 2023-02-24 ENCOUNTER — Other Ambulatory Visit (HOSPITAL_COMMUNITY): Payer: Self-pay

## 2023-02-24 ENCOUNTER — Other Ambulatory Visit: Payer: Self-pay | Admitting: Family Medicine

## 2023-02-24 DIAGNOSIS — I10 Essential (primary) hypertension: Secondary | ICD-10-CM

## 2023-02-24 DIAGNOSIS — E1169 Type 2 diabetes mellitus with other specified complication: Secondary | ICD-10-CM

## 2023-02-24 MED ORDER — EMPAGLIFLOZIN 25 MG PO TABS
25.0000 mg | ORAL_TABLET | Freq: Every day | ORAL | 0 refills | Status: DC
  Filled 2023-02-24: qty 90, 90d supply, fill #0

## 2023-02-24 MED ORDER — LISINOPRIL 10 MG PO TABS
10.0000 mg | ORAL_TABLET | Freq: Every day | ORAL | 0 refills | Status: DC
  Filled 2023-02-24: qty 90, 90d supply, fill #0

## 2023-02-28 ENCOUNTER — Telehealth (INDEPENDENT_AMBULATORY_CARE_PROVIDER_SITE_OTHER): Payer: Commercial Managed Care - PPO | Admitting: Family Medicine

## 2023-03-01 ENCOUNTER — Ambulatory Visit (INDEPENDENT_AMBULATORY_CARE_PROVIDER_SITE_OTHER): Payer: Commercial Managed Care - PPO | Admitting: Physician Assistant

## 2023-03-01 ENCOUNTER — Encounter (INDEPENDENT_AMBULATORY_CARE_PROVIDER_SITE_OTHER): Payer: Self-pay | Admitting: Physician Assistant

## 2023-03-01 ENCOUNTER — Other Ambulatory Visit (HOSPITAL_COMMUNITY): Payer: Self-pay

## 2023-03-01 VITALS — BP 104/69 | HR 64 | Temp 98.2°F | Ht 63.0 in | Wt 226.0 lb

## 2023-03-01 DIAGNOSIS — Z7985 Long-term (current) use of injectable non-insulin antidiabetic drugs: Secondary | ICD-10-CM | POA: Diagnosis not present

## 2023-03-01 DIAGNOSIS — E559 Vitamin D deficiency, unspecified: Secondary | ICD-10-CM | POA: Diagnosis not present

## 2023-03-01 DIAGNOSIS — E669 Obesity, unspecified: Secondary | ICD-10-CM | POA: Diagnosis not present

## 2023-03-01 DIAGNOSIS — E1159 Type 2 diabetes mellitus with other circulatory complications: Secondary | ICD-10-CM

## 2023-03-01 DIAGNOSIS — E785 Hyperlipidemia, unspecified: Secondary | ICD-10-CM

## 2023-03-01 DIAGNOSIS — R5383 Other fatigue: Secondary | ICD-10-CM | POA: Diagnosis not present

## 2023-03-01 DIAGNOSIS — E1169 Type 2 diabetes mellitus with other specified complication: Secondary | ICD-10-CM

## 2023-03-01 DIAGNOSIS — Z6841 Body Mass Index (BMI) 40.0 and over, adult: Secondary | ICD-10-CM

## 2023-03-01 DIAGNOSIS — R0602 Shortness of breath: Secondary | ICD-10-CM | POA: Diagnosis not present

## 2023-03-01 DIAGNOSIS — I152 Hypertension secondary to endocrine disorders: Secondary | ICD-10-CM | POA: Diagnosis not present

## 2023-03-01 DIAGNOSIS — Z7984 Long term (current) use of oral hypoglycemic drugs: Secondary | ICD-10-CM

## 2023-03-01 MED ORDER — TIRZEPATIDE 15 MG/0.5ML ~~LOC~~ SOAJ
15.0000 mg | SUBCUTANEOUS | 0 refills | Status: DC
  Filled 2023-03-01 – 2023-03-07 (×2): qty 2, 28d supply, fill #0

## 2023-03-01 NOTE — Progress Notes (Signed)
.smr  Office: [phone]  /  Fax: [phone]  WEIGHT SUMMARY AND BIOMETRICS  Vitals Temp: 98.2 F (36.8 C) BP: 104/69 Pulse Rate: 64 SpO2: 97 %   Anthropometric Measurements Height: 5\' 3"  (1.6 m) Weight: 226 lb (102.5 kg) BMI (Calculated): 40.04 Weight at Last Visit: 225lb Weight Lost Since Last Visit: 0 Weight Gained Since Last Visit: 1lb Starting Weight: 262lb Total Weight Loss (lbs): 25 lb (11.3 kg)   Body Composition  Body Fat %: 47.3 % Fat Mass (lbs): 107 lbs Muscle Mass (lbs): 113.4 lbs Total Body Water (lbs): 77.2 lbs Visceral Fat Rating : 16   Other Clinical Data Fasting: yes Labs: no Today's Visit #: 25 Starting Date: 07/13/18     HPI  Chief Complaint: OBESITY  Cindy Carey is here to discuss her progress with her obesity treatment plan. She is on the the Category 1 Plan and the Category 2 Plan and states she is following her eating plan approximately 75 % of the time. She states she is exercising at [employer] 45 minutes 3 to 4 times per week.   Interval History:  Since last office visit she is up 1 lb.  Feels she has been plateaued for many months now and frustrated with lack of weight loss.  Discussed may be related to change in metabolism and will plan to repeat indirect calorimetry next in office visit and she was asked to come in 30 minutes early to this visit.  Did better on Mounjaro previously, but does not note much change after starting back on max dose Mounjaro currently.  Hunger/appetite-Not excessive. Low carb plan was not sustainable with work schedule. Went back to Cat. 1-2 plans.  Cravings-denies Stress- Usual high level with taking call, very busy patient schedule, but continues to love working, so manageable. Also caring for elderly parents.  Sleep- disrupted when taking call, but otherwise sleeps well and feels it is restorative Exercise-Does weight training at home. Limited by decreased shoulder movement. Doing cardio at [employer] at least 3 times weekly for 45 min. Hydration-very good, adequate   Pharmacotherapy: Mounjaro 15 mg weekly. Denies mass in neck, dysphagia, dyspepsia, persistent hoarseness, abdominal pain, or N/V/Constipation or diarrhea. Has annual eye exam. Mood is stable. No recent skin reactions with Mounjaro currently.  Was on [facility] 05/2021-05/2022 with 20 lb weight loss.  Ozempic started due to local skin reaction with Mounjaro and weight loss plateaued following this change.    TREATMENT PLAN FOR OBESITY:  Recommended Dietary Goals  Cindy Carey is currently in the action stage of change. As such, her goal is to continue weight management plan. She has agreed to the Category 2 Plan.  Behavioral Intervention  We discussed the following Behavioral Modification Strategies today: increasing lean protein intake, decreasing simple carbohydrates , increasing vegetables, increasing fiber rich foods, avoiding skipping meals, continue to practice mindfulness when eating, and planning for success.  Additional resources provided today: NA  Recommended Physical Activity Goals  Cindy Carey has been advised to work up to 150 minutes of moderate intensity aerobic activity a week and strengthening exercises 2-3 times per week for cardiovascular health, weight loss maintenance and preservation of muscle mass.   She has agreed to Continue current level of physical activity    Pharmacotherapy We discussed various medication options to help Cindy Carey with her weight loss efforts and we both agreed to continue [facility] for Type 2 diabetes.    Return in about 4 weeks (around 03/29/2023) for Fasting IC at visit in July .[name]  She was informed of the importance of frequent follow up visits to maximize her success with intensive lifestyle modifications for her multiple health conditions.  PHYSICAL EXAM:  Blood pressure 104/69, pulse 64, temperature 98.2 F (36.8 C), height 5\' 3"  (1.6 m), weight 226 lb (102.5 kg), last menstrual  period 10/18/1982, SpO2 97 %. Body mass index is 40.03 kg/m.  General: She is overweight, cooperative, alert, well developed, and in no acute distress. PSYCH: Has normal mood, affect and thought process.   Cardiovascular: HR 60's regular Lungs: Normal breathing effort, no conversational dyspnea. Neuro: no focal deficits  DIAGNOSTIC DATA REVIEWED:  BMET    Component Value Date/Time   NA 142 08/03/2022 0755   NA 139 11/21/2015 0826   K 5.0 08/03/2022 0755   K 4.7 11/21/2015 0826   CL 104 08/03/2022 0755   CO2 22 08/03/2022 0755   CO2 24 11/21/2015 0826   GLUCOSE 81 08/03/2022 0755   GLUCOSE 96 09/30/2016 0811   GLUCOSE 125 11/21/2015 0826   BUN 13 08/03/2022 0755   BUN 12.5 11/21/2015 0826   CREATININE 0.75 08/03/2022 0755   CREATININE 0.93 09/30/2016 0811   CREATININE 1.0 11/21/2015 0826   CALCIUM 9.8 08/03/2022 0755   CALCIUM 9.6 11/21/2015 0826   GFRNONAA 76 07/09/2020 1037   GFRAA 87 07/09/2020 1037   Lab Results  Component Value Date   HGBA1C 5.4 08/03/2022   HGBA1C 6.7 03/31/2016   Lab Results  Component Value Date   INSULIN 39.2 (H) 08/03/2022   INSULIN 59.1 (H) 07/13/2018   Lab Results  Component Value Date   TSH 1.490 03/10/2022   CBC    Component Value Date/Time   WBC 6.4 03/10/2022 0854   WBC 7.8 09/30/2016 0811   RBC 4.47 03/10/2022 0854   RBC 4.92 09/30/2016 0811   HGB 12.3 03/10/2022 0854   HGB 15.1 11/21/2015 0826   HCT 37.5 03/10/2022 0854   HCT 46.3 11/21/2015 0826   PLT 81 (LL) 03/10/2022 0854   MCV 84 03/10/2022 0854   MCV 95.1 11/21/2015 0826   MCH 27.5 03/10/2022 0854   MCH 32.3 09/30/2016 0811   MCHC 32.8 03/10/2022 0854   MCHC 33.1 09/30/2016 0811   RDW 15.6 (H) 03/10/2022 0854   RDW 15.1 (H) 11/21/2015 0826   Iron Studies    Component Value Date/Time   IRON 84 12/07/2017 1619   Lipid Panel     Component Value Date/Time   CHOL 132 08/03/2022 0755   TRIG 103 08/03/2022 0755   HDL 67 08/03/2022 0755   CHOLHDL 2.0  08/03/2022 0755   CHOLHDL 2.3 09/30/2016 0811   VLDL 26 09/30/2016 0811   LDLCALC 46 08/03/2022 0755   Hepatic Function Panel     Component Value Date/Time   PROT 7.2 08/03/2022 0755   PROT 7.2 11/21/2015 0826   ALBUMIN 4.3 08/03/2022 0755   ALBUMIN 3.8 11/21/2015 0826   AST 31 08/03/2022 0755   AST 24 11/21/2015 0826   ALT 20 08/03/2022 0755   ALT 24 11/21/2015 0826   ALKPHOS 118 08/03/2022 0755   ALKPHOS 75 11/21/2015 0826   BILITOT 0.7 08/03/2022 0755   BILITOT 0.58 11/21/2015 0826      Component Value Date/Time   TSH 1.490 03/10/2022 0854   Nutritional Lab Results  Component Value Date   VD25OH 56.6 08/03/2022   VD25OH 41.4 03/23/2019   VD25OH 48.7 12/14/2018    ASSOCIATED CONDITIONS ADDRESSED TODAY  ASSESSMENT AND PLAN  Problem List Items Addressed  This Visit     Diabetes (HCC) - Primary   Relevant Medications   tirzepatide (MOUNJARO) 15 MG/0.5ML Pen   Other Relevant Orders   CMP14+EGFR   Hemoglobin A1c   Insulin, random   Hypertension associated with diabetes (HCC)   Relevant Medications   tirzepatide (MOUNJARO) 15 MG/0.5ML Pen   Other fatigue   Relevant Orders   Vitamin B12   CBC with Differential/Platelet   TSH   Hyperlipidemia associated with type 2 diabetes mellitus (HCC)   Relevant Medications   tirzepatide (MOUNJARO) 15 MG/0.5ML Pen   Other Relevant Orders   Lipid Panel With LDL/HDL Ratio   Vitamin D deficiency   Relevant Orders   VITAMIN D 25 Hydroxy (Vit-D Deficiency, Fractures)   Obesity (HCC)- Start BMI 46.41   Relevant Medications   tirzepatide (MOUNJARO) 15 MG/0.5ML Pen   SOB (shortness of breath) on exertion  SOB on exertion:  Discussed may be experiencing a decline in resting metabolic rate, making weight loss more challenging.  Plan: Will plan to repeat indirect calorimetry at next in office visit in July.  She will continue on Cat 2 plan at this time based on previous RMR 08/03/2022 of 1886.   Type 2 Diabetes Mellitus with  other specified complication, without long-term current use of insulin HgbA1c is at goal. Last A1c was 5.4 CBGs: Fasting 80's Episodes of hypoglycemia: no On ACE-lisinopril On Statin- Zocor Eye exam Jan. 2024 Medication(s): Mounjaro 15 mg SQ weekly Denies mass in neck, dysphagia, dyspepsia, persistent hoarseness, abdominal pain, or N/V/Constipation or diarrhea. Has annual eye exam. Mood is stable.  Some occasional nausea after eating but resolves without treatment.  Jardiance 25 mg daily. No side effects with Jardiance.  Metformin 1000 mg twice daily. No GI side effects with metformin.   Lab Results  Component Value Date   HGBA1C 5.4 08/03/2022   HGBA1C 5.8 (H) 12/14/2021   HGBA1C 6.4 (A) 06/08/2021   Lab Results  Component Value Date   MICROALBUR 0.3 03/31/2016   LDLCALC 46 08/03/2022   CREATININE 0.75 08/03/2022   No results found for: "GFR"  Plan: Continue and refill Mounjaro 15 mg SQ weekly Continue Metformin and Jardiance as ordered.  Continue working on nutrition plan to decrease simple carbohydrates, increase lean proteins and exercise to promote weight loss, and improve glycemic control. Rechecked fasting labs today.   Hypertension Hypertension well controlled, asymptomatic, and no significant medication side effects noted.  Medication(s): lisinopril 10 mg daily.   No symptoms of hypotension. Renal normal.   BP Readings from Last 3 Encounters:  03/01/23 104/69  11/29/22 110/66  09/28/22 113/75   Lab Results  Component Value Date   CREATININE 0.75 08/03/2022   CREATININE 0.75 12/14/2021   CREATININE 0.75 03/09/2021   No results found for: "GFR"  Plan: Continue all antihypertensives at current dosages. Continue to work on nutrition plan to promote weight loss and improve BP control.  Recheck labs today   Hyperlipidemia LDL is at goal. Medication(s): zocor 40mg  daily Cardiovascular risk factors: advanced age (older than 57 for men, 88 for women), diabetes  mellitus, dyslipidemia, and obesity (BMI >= 30 kg/m2)  Lab Results  Component Value Date   CHOL 132 08/03/2022   HDL 67 08/03/2022   LDLCALC 46 08/03/2022   TRIG 103 08/03/2022   CHOLHDL 2.0 08/03/2022   CHOLHDL 1.9 03/09/2021   CHOLHDL 1.7 12/20/2019   Lab Results  Component Value Date   ALT 20 08/03/2022   AST 31 08/03/2022  ALKPHOS 118 08/03/2022   BILITOT 0.7 08/03/2022   The 10-year ASCVD risk score (Arnett DK, et al., 2019) is: 7%   Values used to calculate the score:     Age: 67 years     Sex: Female     Is Non-Hispanic African American: No     Diabetic: Yes     Tobacco smoker: No     Systolic Blood Pressure: 104 mmHg     Is BP treated: Yes     HDL Cholesterol: 67 mg/dL     Total Cholesterol: 132 mg/dL  Plan: Continue statin. Continue to work on nutrition plan -decreasing simple carbohydrates, increasing lean proteins, decreasing saturated fats and cholesterol , avoiding trans fats and exercise as able to promote weight loss, improve lipids and decrease cardiovascular risks. Recheck fasting lab today.    Vitamin D Deficiency Vitamin D is at goal of 50.  Most recent vitamin D level was 56.6. She is on OTC vitamin D3 5000 IU daily. Lab Results  Component Value Date   VD25OH 56.6 08/03/2022   VD25OH 41.4 03/23/2019   VD25OH 48.7 12/14/2018    Plan: Continue OTC vitamin D3 5000 IU daily Recheck vitamin D level today.  Low vitamin D levels can be associated with adiposity and may result in leptin resistance and weight gain. Also associated with fatigue. Currently on vitamin D supplementation without any adverse effects.    Fatigue: Endorsed fatigue. On call with work and disrupted sleep during these weeks and also caring for elderly parents on regular basis.   Plan: Recheck CBC, B12, TSH, Vitamin D level and supplement accordingly.    ATTESTASTION STATEMENTS:  Reviewed by clinician on day of visit: allergies, medications, problem list, medical history,  surgical history, family history, social history, and previous encounter notes.   I have personally spent 46 minutes total time today in preparation, patient care, nutritional counseling and documentation for this visit, including the following: review of clinical lab tests; review of medical tests/procedures/services.      Malini Flemings, PA-C

## 2023-03-02 LAB — CBC WITH DIFFERENTIAL/PLATELET
Basophils Absolute: 0 10*3/uL (ref 0.0–0.2)
Basos: 0 %
EOS (ABSOLUTE): 0.1 10*3/uL (ref 0.0–0.4)
Eos: 3 %
Hematocrit: 40.6 % (ref 34.0–46.6)
Hemoglobin: 12.9 g/dL (ref 11.1–15.9)
Immature Grans (Abs): 0 10*3/uL (ref 0.0–0.1)
Immature Granulocytes: 0 %
Lymphocytes Absolute: 1.6 10*3/uL (ref 0.7–3.1)
Lymphs: 34 %
MCH: 27.2 pg (ref 26.6–33.0)
MCHC: 31.8 g/dL (ref 31.5–35.7)
MCV: 86 fL (ref 79–97)
Monocytes Absolute: 0.5 10*3/uL (ref 0.1–0.9)
Monocytes: 9 %
Neutrophils Absolute: 2.6 10*3/uL (ref 1.4–7.0)
Neutrophils: 54 %
Platelets: 81 10*3/uL — CL (ref 150–450)
RBC: 4.74 x10E6/uL (ref 3.77–5.28)
RDW: 15.1 % (ref 11.7–15.4)
WBC: 4.8 10*3/uL (ref 3.4–10.8)

## 2023-03-02 LAB — HEMOGLOBIN A1C
Est. average glucose Bld gHb Est-mCnc: 108 mg/dL
Hgb A1c MFr Bld: 5.4 % (ref 4.8–5.6)

## 2023-03-02 LAB — CMP14+EGFR
ALT: 20 IU/L (ref 0–32)
AST: 27 IU/L (ref 0–40)
Albumin/Globulin Ratio: 1.7 (ref 1.2–2.2)
Albumin: 4.2 g/dL (ref 3.9–4.9)
Alkaline Phosphatase: 122 IU/L — ABNORMAL HIGH (ref 44–121)
BUN/Creatinine Ratio: 12 (ref 12–28)
BUN: 10 mg/dL (ref 8–27)
Bilirubin Total: 0.4 mg/dL (ref 0.0–1.2)
CO2: 22 mmol/L (ref 20–29)
Calcium: 10 mg/dL (ref 8.7–10.3)
Chloride: 104 mmol/L (ref 96–106)
Creatinine, Ser: 0.83 mg/dL (ref 0.57–1.00)
Globulin, Total: 2.5 g/dL (ref 1.5–4.5)
Glucose: 87 mg/dL (ref 70–99)
Potassium: 5.2 mmol/L (ref 3.5–5.2)
Sodium: 140 mmol/L (ref 134–144)
Total Protein: 6.7 g/dL (ref 6.0–8.5)
eGFR: 78 mL/min/{1.73_m2} (ref >59)

## 2023-03-02 LAB — LIPID PANEL WITH LDL/HDL RATIO
Cholesterol, Total: 127 mg/dL (ref 100–199)
HDL: 77 mg/dL (ref >39)
LDL Chol Calc (NIH): 34 mg/dL (ref 0–99)
LDL/HDL Ratio: 0.4 ratio (ref 0.0–3.2)
Triglycerides: 82 mg/dL (ref 0–149)
VLDL Cholesterol Cal: 16 mg/dL (ref 5–40)

## 2023-03-02 LAB — TSH: TSH: 2.37 u[IU]/mL (ref 0.450–4.500)

## 2023-03-02 LAB — INSULIN, RANDOM: INSULIN: 39.3 u[IU]/mL — ABNORMAL HIGH (ref 2.6–24.9)

## 2023-03-02 LAB — VITAMIN B12: Vitamin B-12: >2000 pg/mL — ABNORMAL HIGH (ref 232–1245)

## 2023-03-02 LAB — VITAMIN D 25 HYDROXY (VIT D DEFICIENCY, FRACTURES): Vit D, 25-Hydroxy: 71.3 ng/mL (ref 30.0–100.0)

## 2023-03-03 ENCOUNTER — Other Ambulatory Visit (HOSPITAL_COMMUNITY): Payer: Self-pay

## 2023-03-04 ENCOUNTER — Other Ambulatory Visit (HOSPITAL_COMMUNITY): Payer: Self-pay

## 2023-03-07 ENCOUNTER — Encounter (INDEPENDENT_AMBULATORY_CARE_PROVIDER_SITE_OTHER): Payer: Self-pay | Admitting: Physician Assistant

## 2023-03-07 ENCOUNTER — Other Ambulatory Visit (HOSPITAL_COMMUNITY): Payer: Self-pay

## 2023-03-07 ENCOUNTER — Other Ambulatory Visit (INDEPENDENT_AMBULATORY_CARE_PROVIDER_SITE_OTHER): Payer: Self-pay | Admitting: Physician Assistant

## 2023-03-07 DIAGNOSIS — E1169 Type 2 diabetes mellitus with other specified complication: Secondary | ICD-10-CM

## 2023-03-07 MED ORDER — TIRZEPATIDE 12.5 MG/0.5ML ~~LOC~~ SOAJ
12.5000 mg | SUBCUTANEOUS | 2 refills | Status: DC
Start: 2023-03-07 — End: 2023-03-30
  Filled 2023-03-07 (×2): qty 2, 28d supply, fill #0

## 2023-03-07 NOTE — Progress Notes (Signed)
Sent Mounjaro 12.5 mg weekly in with 2 refills as 15 mg dose currently not available due to drug shortage.  Klarissa Mcilvain,PA-C

## 2023-03-15 DIAGNOSIS — D696 Thrombocytopenia, unspecified: Secondary | ICD-10-CM | POA: Insufficient documentation

## 2023-03-15 NOTE — Patient Instructions (Incomplete)
  HEALTH MAINTENANCE RECOMMENDATIONS:  It is recommended that you get at least 30 minutes of aerobic exercise at least 5 days/week (for weight loss, you may need as much as 60-90 minutes). This can be any activity that gets your heart rate up. This can be divided in 10-15 minute intervals if needed, but try and build up your endurance at least once a week.  Weight bearing exercise is also recommended twice weekly.  Eat a healthy diet with lots of vegetables, fruits and fiber.  "Colorful" foods have a lot of vitamins (ie green vegetables, tomatoes, red peppers, etc).  Limit sweet tea, regular sodas and alcoholic beverages, all of which has a lot of calories and sugar.  Up to 1 alcoholic drink daily may be beneficial for women (unless trying to lose weight, watch sugars).  Drink a lot of water.  Calcium recommendations are 1200-1500 mg daily (1500 mg for postmenopausal women or women without ovaries), and vitamin D 1000 IU daily.  This should be obtained from diet and/or supplements (vitamins), and calcium should not be taken all at once, but in divided doses.  Monthly self breast exams and yearly mammograms for women over the age of 40 is recommended.  Sunscreen of at least SPF 30 should be used on all sun-exposed parts of the skin when outside between the hours of 10 am and 4 pm (not just when at beach or pool, but even with exercise, golf, tennis, and yard work!)  Use a sunscreen that says "broad spectrum" so it covers both UVA and UVB rays, and make sure to reapply every 1-2 hours.  Remember to change the batteries in your smoke detectors when changing your clock times in the spring and fall. Carbon monoxide detectors are recommended for your home.  Use your seat belt every time you are in a car, and please drive safely and not be distracted with cell phones and texting while driving.   

## 2023-03-15 NOTE — Progress Notes (Unsigned)
No chief complaint on file.  Cindy Carey is a 67 y.o. female who presents for a complete physical.   She has the following concerns:  GERD:  Patient with known hiatal hernia and issues with heartburn.  She had been taking Pepcid 20mg  at bedtime, and symptoms improved, needing much less Tums. Currently UPDATE--taking pepcid qHS? Heartburn?  Last year--Still has occasional heartburn, depends on what she eats.    Hypertension follow-up:  Blood pressures are running  Denies dizziness, headaches, chest pain.   Denies side effects of medications.   She has leg cramps intermittently at night, related to which shoes she wore all day.  She also gets leg cramps with long car rides if they don't stop. Denies any recent changes/worsening.   BP Readings from Last 3 Encounters:  03/01/23 104/69  11/29/22 110/66  09/28/22 113/75    Migraines--She takes magnesium supplement, no longer on any other medications.  UPDATE--REQUESTED REMOVAL OF MG  She uses ibuprofen at time of aura, phenergan if that doesn't work.   Hasn't had a headache in a long time. (Stopped topiramate in the past due to hair thinning. Improved after stopping, but improved much more after completing tamoxifen). She uses flexeril before bedtime when muscles are very tight, or if needed for R shoulder pain (1/2 tablet once a week), or else she won't sleep well.  She uses Tylenol before bedtime for other pains (hip, knee).   Hyperlipidemia follow-up:  Patient is reportedly following a low-fat, low cholesterol diet. Compliant with medications (simvastatin 40mg ) and denies medication side effects. Lipids were at goal on last check: Lab Results  Component Value Date   CHOL 127 03/01/2023   HDL 77 03/01/2023   LDLCALC 34 03/01/2023   TRIG 82 03/01/2023   CHOLHDL 2.0 08/03/2022   Diabetes:  She continues on Jardiance (25mg  daily), metformin (1000mg  BID). GLP's are managed by [facility]--had been on Ozempic, switched to [facility],  switched back to Ozempic due to local skin reaction from [facility].  Recently went back to [facility] (frustrated with plateau), so far no skin irritation.  Currently is on 12.5 mg dose (UPDATE 15??)  Sugars are running Denies hypoglycemia. Last eye exam: 10/2021, no retinopathy Checks feet regularly without concerns. Lab Results  Component Value Date   HGBA1C 5.4 03/01/2023    Non-obstructive CAD: Calcium score of 1 in 01/2018. She had CT calcium score repeated 02/27/21 (part of Doctor's Day):  Coronary calcium score of 36. This was 71 percentile for age-, race-, and sex-matched controls. Aortic arch atherosclerosis noted, small focal area. She saw Dr. Duke Carey for f/u in 07/2021.  No changes were made. She is compliant with ASA 81mg  daily and statin. She denies chest pain, DOE, palpitations.  Left breast intraductal papilloma with atypical ductal hyperplasia diagnosed 07/15/2015; status post lumpectomy 07/23/2015. Due to lifetime risk of breast cancer of 25%, she was treated with risk lowering therapy with Tamoxifen for 5 years, completing in 08/2020.  She last saw Dr. Pamelia Carey last in 02/2018. Last mammogram as 08/2022.   Obesity:  She was deemed ineligible for bariatric surgery by her insurance.  She has tried multiple weight loss medications, and was not successful in losing weight with [facility] in the past.  She is back to going regularly to their clinic.  They have used both Ozempic and Mounjaro. She lost 20# on Mounjar0, switched back to Ozempic after developing a local skin reaction.  She hit a plateau, and was switched  back to [facility], which she is tolerating well so far. Current dose is  She is going to the gym 3x/week, exercising 45 minutes  She walks her dog daily.  Walk is slow, often she picks the dog up, and will walk faster while holding the dog.  Mild thrombocytopenia. She denies bleeding or bruising (other than minor bruising at injection sites).   Lab Results  Component Value Date   WBC 4.8 03/01/2023   HGB 12.9 03/01/2023   HCT 40.6 03/01/2023   MCV 86 03/01/2023   PLT 81 (LL) 03/01/2023     Immunization History  Administered Date(s) Administered   COVID-19, mRNA, vaccine(Comirnaty)12 years and older 07/30/2022   Influenza,inj,Quad PF,6+ Mos 07/09/2020, 07/30/2022   Influenza-Unspecified 06/27/2015, 07/03/2017, 07/05/2018, 07/19/2019, 07/20/2021   PFIZER Comirnaty(Gray Top)Covid-19 Tri-Sucrose Vaccine 03/09/2021   PFIZER(Purple Top)SARS-COV-2 Vaccination 10/20/2019, 11/10/2019, 07/09/2020   Pfizer Covid-19 Vaccine Bivalent Booster 59yrs & up 12/15/2021   Pneumococcal Polysaccharide-23 03/31/2016   Tdap 07/03/2015   Zoster Recombinat (Shingrix) 12/07/2017, 02/10/2018   Last Pap smear:  per GYN, s/p hysterectomy. Last saw Dr. Oscar Carey 02/2022 Last mammogram: 08/2021 Last colonoscopy: 02/2018, hyperplastic polyp Dr. Loreta Carey Last DEXA: 09/2017 T-1.3 at L fem neck Dentist: twice yearly Ophtho: yearly Exercise:  [employer] 45 mins 3x/week walks 30 minutes daily with the dog, who is 7 years old. She often needs to carry him and walk.  Vitamin D-OH 71.3 in 02/2023   PMH, PSH, SH and FH were reviewed and updated     ROS:  The patient denies anorexia, fever, vision changes, decreased hearing, ear pain, sore throat, breast concerns, chest pain, palpitations, dizziness, syncope, dyspnea on exertion, cough, swelling, vomiting, diarrhea, constipation, abdominal pain, melena, hematochezia, hematuria, incontinence, dysuria, vaginal bleeding, discharge, odor, genital lesions, numbness, tingling, weakness, tremor, suspicious skin lesions, depression, anxiety, abnormal bleeding/bruising, or enlarged lymph nodes.  R>L knee pain, depends on the shoes she wears. Worse with stairs/hills, not a problem when walking dog. Migraines are infrequent. Reflux is controlled. Urinary incont?   PHYSICAL EXAM:  LMP 10/18/1982   Wt Readings  from Last 3 Encounters:  03/01/23 226 lb (102.5 kg)  11/29/22 225 lb (102.1 kg)  09/28/22 223 lb (101.2 kg)   Last CPE at our office--03/10/2022  Wt  236 lb 6.4 oz (107.2 kg)   General Appearance:    Alert, cooperative, no distress, appears stated age. She did not undress into gown for exam.   Head:    Normocephalic, without obvious abnormality, atraumatic  Eyes:    PERRL, conjunctiva/corneas clear, EOM's intact, fundi benign  Ears:    Normal TM's and external ear canals.   Nose:   No drainage, sinuses nontender  Throat:   Normal mucosa, no lesions  Neck:   Supple, no lymphadenopathy;  thyroid:  no enlargement/tenderness/ nodules; no carotid bruit or JVD  Back:    Spine nontender, no curvature, ROM normal, no CVA tenderness  Lungs:     Clear to auscultation bilaterally without wheezes, rales or ronchi; respirations unlabored  Chest Wall:    Nontender, no deformity   Heart:    Regular rate and rhythm, S1 and S2 normal, no murmur, rub or gallop  Breast Exam:    Deferred to GYN  Abdomen:     Soft, non-tender, nondistended, normoactive bowel sounds, no masses, no hepatosplenomegaly  Genitalia:    Deferred to GYN       Extremities:   No clubbing, cyanosis or edema. Normal diabetic foot exam.   Pulses:  2+ and symmetric all extremities  Skin:   Skin color, texture, turgor normal, no rashes or lesions. Exam is limited due to not changing into gown. Small hyperkeratotic lesion noted on R forearm (poss AK, advised to show to derm).  Lymph nodes:   Cervical, supraclavicular nodes normal  Neurologic:   Normal strength, sensation and gait; reflexes 2+ and symmetric throughout                        Psych:   Normal mood, affect, hygiene and grooming.   Diabetic foot exam performed, normal.  DIABETIC FOOT EXAM ***  Labs done 02/2023 by [facility]:  Vitamin D-OH 71.3 Insulin 39.3 (high) Fasting glucose 87  Lab Results  Component Value Date   HGBA1C 5.4 03/01/2023     Chemistry       Component Value Date/Time   NA 140 03/01/2023 0759   NA 139 11/21/2015 0826   K 5.2 03/01/2023 0759   K 4.7 11/21/2015 0826   CL 104 03/01/2023 0759   CO2 22 03/01/2023 0759   CO2 24 11/21/2015 0826   BUN 10 03/01/2023 0759   BUN 12.5 11/21/2015 0826   CREATININE 0.83 03/01/2023 0759   CREATININE 0.93 09/30/2016 0811   CREATININE 1.0 11/21/2015 0826      Component Value Date/Time   CALCIUM 10.0 03/01/2023 0759   CALCIUM 9.6 11/21/2015 0826   ALKPHOS 122 (H) 03/01/2023 0759   ALKPHOS 75 11/21/2015 0826   AST 27 03/01/2023 0759   AST 24 11/21/2015 0826   ALT 20 03/01/2023 0759   ALT 24 11/21/2015 0826   BILITOT 0.4 03/01/2023 0759   BILITOT 0.58 11/21/2015 0826     Lab Results  Component Value Date   CHOL 127 03/01/2023   HDL 77 03/01/2023   LDLCALC 34 03/01/2023   TRIG 82 03/01/2023   CHOLHDL 2.0 08/03/2022   Lab Results  Component Value Date   TSH 2.370 03/01/2023   Lab Results  Component Value Date   WBC 4.8 03/01/2023   HGB 12.9 03/01/2023   HCT 40.6 03/01/2023   MCV 86 03/01/2023   PLT 81 (LL) 03/01/2023     ASSESSMENT/PLAN:  Need urine microalbumin/cr ratio  66 but not on Medicare.  Just fall and depression screen. Can start getting list of doctors together Looks like she has GYN appt with NP, since Dr. Oscar Carey is leaving/left  Did she have DM eye exam this year?  Last was 10/2021 in chart Prevnar-20 today Eligible for COVID booster (age 10), if desired, may not want both in 1 day.   Did she get RSV from pharmacy?   Discussed monthly self breast exams and yearly mammograms; at least 30 minutes of aerobic activity at least 5 days/week, weight-bearing exercise; proper sunscreen use reviewed; healthy diet, including goals of calcium and vitamin D intake and alcohol recommendations (less than or equal to 1 drink/day) reviewed; regular seatbelt use; changing batteries in smoke detectors.  Immunization recommendations discussed--continue yearly flu shots,  now high dose.  Prevnar-20 at age 21.  COVID booster RSV in the Fall Colonoscopy recommendations reviewed, UTD Consider repeat DEXA (can d/w GYN), in the next year or so.

## 2023-03-16 ENCOUNTER — Other Ambulatory Visit (HOSPITAL_COMMUNITY): Payer: Self-pay

## 2023-03-16 ENCOUNTER — Ambulatory Visit: Payer: No Typology Code available for payment source | Admitting: Family Medicine

## 2023-03-16 ENCOUNTER — Encounter: Payer: Self-pay | Admitting: Family Medicine

## 2023-03-16 VITALS — BP 120/78 | HR 68 | Ht 63.0 in | Wt 231.2 lb

## 2023-03-16 DIAGNOSIS — G43109 Migraine with aura, not intractable, without status migrainosus: Secondary | ICD-10-CM

## 2023-03-16 DIAGNOSIS — I2510 Atherosclerotic heart disease of native coronary artery without angina pectoris: Secondary | ICD-10-CM | POA: Diagnosis not present

## 2023-03-16 DIAGNOSIS — Z23 Encounter for immunization: Secondary | ICD-10-CM

## 2023-03-16 DIAGNOSIS — E1159 Type 2 diabetes mellitus with other circulatory complications: Secondary | ICD-10-CM | POA: Diagnosis not present

## 2023-03-16 DIAGNOSIS — D696 Thrombocytopenia, unspecified: Secondary | ICD-10-CM | POA: Diagnosis not present

## 2023-03-16 DIAGNOSIS — I700 Atherosclerosis of aorta: Secondary | ICD-10-CM | POA: Diagnosis not present

## 2023-03-16 DIAGNOSIS — M25521 Pain in right elbow: Secondary | ICD-10-CM

## 2023-03-16 DIAGNOSIS — E1169 Type 2 diabetes mellitus with other specified complication: Secondary | ICD-10-CM

## 2023-03-16 DIAGNOSIS — E119 Type 2 diabetes mellitus without complications: Secondary | ICD-10-CM

## 2023-03-16 DIAGNOSIS — E785 Hyperlipidemia, unspecified: Secondary | ICD-10-CM

## 2023-03-16 DIAGNOSIS — Z0000 Encounter for general adult medical examination without abnormal findings: Secondary | ICD-10-CM

## 2023-03-16 DIAGNOSIS — E559 Vitamin D deficiency, unspecified: Secondary | ICD-10-CM

## 2023-03-16 DIAGNOSIS — I152 Hypertension secondary to endocrine disorders: Secondary | ICD-10-CM

## 2023-03-16 DIAGNOSIS — K219 Gastro-esophageal reflux disease without esophagitis: Secondary | ICD-10-CM

## 2023-03-16 LAB — POCT URINALYSIS DIP (PROADVANTAGE DEVICE)
Bilirubin, UA: NEGATIVE
Blood, UA: NEGATIVE
Glucose, UA: >=1000 mg/dL — AB
Ketones, POC UA: NEGATIVE mg/dL
Leukocytes, UA: NEGATIVE
Nitrite, UA: NEGATIVE
Protein Ur, POC: NEGATIVE mg/dL
Specific Gravity, Urine: 1.015
Urobilinogen, Ur: 0.2
pH, UA: 6 (ref 5.0–8.0)

## 2023-03-16 MED ORDER — METFORMIN HCL ER 500 MG PO TB24
1000.0000 mg | ORAL_TABLET | Freq: Two times a day (BID) | ORAL | 1 refills | Status: DC
Start: 2023-03-16 — End: 2023-09-20
  Filled 2023-03-16: qty 360, 90d supply, fill #0
  Filled 2023-06-27: qty 360, 90d supply, fill #1

## 2023-03-16 MED ORDER — MELOXICAM 15 MG PO TABS
15.0000 mg | ORAL_TABLET | Freq: Every day | ORAL | 0 refills | Status: DC
Start: 2023-03-16 — End: 2023-04-06
  Filled 2023-03-16: qty 15, 15d supply, fill #0

## 2023-03-16 MED ORDER — SIMVASTATIN 40 MG PO TABS
40.0000 mg | ORAL_TABLET | Freq: Every evening | ORAL | 3 refills | Status: DC
Start: 2023-03-16 — End: 2024-03-04
  Filled 2023-03-16: qty 90, 90d supply, fill #0
  Filled 2023-06-09: qty 90, 90d supply, fill #1
  Filled 2023-09-04: qty 90, 90d supply, fill #2
  Filled 2023-12-03: qty 90, 90d supply, fill #3

## 2023-03-16 MED ORDER — CYCLOBENZAPRINE HCL 10 MG PO TABS
10.0000 mg | ORAL_TABLET | Freq: Three times a day (TID) | ORAL | 0 refills | Status: DC | PRN
  Filled 2023-03-16: qty 30, 10d supply, fill #0

## 2023-03-17 DIAGNOSIS — K219 Gastro-esophageal reflux disease without esophagitis: Secondary | ICD-10-CM | POA: Insufficient documentation

## 2023-03-18 LAB — MICROALBUMIN / CREATININE URINE RATIO
Creatinine, Urine: 89.7 mg/dL
Microalb/Creat Ratio: 5 mg/g creat (ref 0–29)
Microalbumin, Urine: 4.6 ug/mL

## 2023-03-21 ENCOUNTER — Encounter: Payer: Self-pay | Admitting: *Deleted

## 2023-03-28 NOTE — Progress Notes (Signed)
TeleHealth Visit:  This visit was completed with telemedicine (audio/video) technology. Cindy Carey has verbally consented to this TeleHealth visit. The patient is located at home, the provider is located at home. The participants in this visit include the listed provider and patient. The visit was conducted today via MyChart video.  OBESITY Cindy Carey is here to discuss her progress with her obesity treatment plan along with follow-up of her obesity related diagnoses.   Today's visit was # 26 Starting weight: 262 lbs Starting date: 07/13/18 Weight at last in office visit: 226 lbs on 03/01/23 Total weight loss: 25 lbs at last in office visit on 03/01/23. Today's reported weight (03/30/23):  230 lbs  Nutrition Plan: the Category 2 plan - 75% adherence.  Current exercise:  [employer] 45 minutes 3 to 4 times per week   Interim History:  She voices frustration with weight plateau.  She has been between 225-227 pounds since October 2023. Getting in adequate protein and water.  Cooking dinner at home. She is journaling and keeping calories at 1200 or below.  She gets 4 oz of protein at lunch and 6 oz of protein at dinner.  She gets tired of eggs for breakfast.  Currently having special K protein cereal. 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  She is thinking about retiring next year.  Assessment/Plan:  We discussed recent lab results in depth.  1.  Thrombocytopenia Platelets are low at 81.  Have been low as far back as 2016.  In 2016 platelet count was 145.  Has trended down.  Denies bleeding or bruising.  Has not been evaluated by hematology.  PCP says as long as she is not symptomatic she will just keep an eye on it. Lab Results  Component Value Date   PLT 81 (LL) 03/01/2023   Plan: Continue to monitor for bruising or bleeding. Defer to PCP for management.  2. B12 Deficiency Last B12 level was elevated at greater  than 2000.. Currently taking metformin - yes -metformin XR 1000 mg twice daily.. She is currently on daily supplementation.  She has discontinued supplementation since she noted her high B12 level.. Lab Results  Component Value Date   VITAMINB12 >2000 (H) 03/01/2023   VITAMINB12 237 08/03/2022   VITAMINB12 354 07/13/2018    Plan: Remain off of B12 supplementation for now. Recheck in 3-4 months.  3. Vitamin D Deficiency Vitamin D is at goal of 50.  Most recent vitamin D level was 71, up from 56 in October 2023.[name] She is on OTC vitamin D3 5000 IU daily. Lab Results  Component Value Date   VD25OH 71.3 03/01/2023   VD25OH 56.6 08/03/2022   VD25OH 41.4 03/23/2019    Plan: Continue and decrease dose to 5000 IU Monday through Friday.  4. Type 2 Diabetes Mellitus with other specified complication, without long-term current use of insulin HgbA1c is at goal. Last A1c was 5.4 Medication(s): Zepbound 12.5 mg SQ weekly and Mounjaro 12.5 mg SQ weekly, metformin XR 1000 mg twice daily, Jardiance 25 mg daily. Previously on Mounjaro 15 mg but dose reduced to 12.5 mg due to supply issues. Has no effect on appetite for her.  Lab Results  Component Value Date   HGBA1C 5.4 03/01/2023   HGBA1C 5.4 08/03/2022   HGBA1C 5.8 (H) 12/14/2021   Lab Results  Component Value Date   MICROALBUR 0.3 03/31/2016   LDLCALC 34 03/01/2023   CREATININE 0.83 03/01/2023  No results found for: "GFR"  Plan: Continue and increase dose Mounjaro 15 mg SQ weekly 15 mg sent-we will see if it is in stock.  She will let me know if I need to send another dose for her. Continue metformin and Jardiance at current dosages.  5. Morbid Obesity: Current BMI 40 Check IC next visit to ascertain if change in RMR is responsible for weight plateau.  Gem is currently in the action stage of change. As such, her goal is to continue with weight loss efforts.  She has agreed to the Category 2 plan.  1.  Discussed alternative  breakfast ideas. 2.  Continue to journal.  Exercise goals:  as is  Behavioral modification strategies: planning for success.  [name] has agreed to follow-up with our clinic in 4 weeks with fasting IC.  No orders of the defined types were placed in this encounter.   Medications Discontinued During This Encounter  Medication Reason   tirzepatide [facility]) 12.5 MG/0.5ML Pen      Meds ordered this encounter  Medications   tirzepatide (MOUNJARO) 15 MG/0.5ML Pen    Sig: Inject 15 mg into the skin once a week.    Dispense:  2 mL    Refill:  0    Order Specific Question:   Supervising Provider    Answer:   Glennis Brink [[id number]]      Objective:   VITALS: Per patient if applicable, see vitals. GENERAL: Alert and in no acute distress. CARDIOPULMONARY: No increased WOB. Speaking in clear sentences.  PSYCH: Pleasant and cooperative. Speech normal rate and rhythm. Affect is appropriate. Insight and judgement are appropriate. Attention is focused, linear, and appropriate.  NEURO: Oriented as arrived to appointment on time with no prompting.   Attestation Statements:   Reviewed by clinician on day of visit: allergies, medications, problem list, medical history, surgical history, family history, social history, and previous encounter notes.  This was prepared with the assistance of Engineer, civil (consulting).  Occasional wrong-word or sound-a-like substitutions may have occurred due to the inherent limitations of voice recognition software.

## 2023-03-30 ENCOUNTER — Encounter (INDEPENDENT_AMBULATORY_CARE_PROVIDER_SITE_OTHER): Payer: Self-pay | Admitting: Family Medicine

## 2023-03-30 ENCOUNTER — Telehealth (INDEPENDENT_AMBULATORY_CARE_PROVIDER_SITE_OTHER): Payer: Commercial Managed Care - PPO | Admitting: Family Medicine

## 2023-03-30 ENCOUNTER — Encounter (HOSPITAL_COMMUNITY): Payer: Self-pay

## 2023-03-30 ENCOUNTER — Other Ambulatory Visit (HOSPITAL_COMMUNITY): Payer: Self-pay

## 2023-03-30 DIAGNOSIS — E559 Vitamin D deficiency, unspecified: Secondary | ICD-10-CM

## 2023-03-30 DIAGNOSIS — Z7985 Long-term (current) use of injectable non-insulin antidiabetic drugs: Secondary | ICD-10-CM

## 2023-03-30 DIAGNOSIS — E1169 Type 2 diabetes mellitus with other specified complication: Secondary | ICD-10-CM

## 2023-03-30 DIAGNOSIS — Z7984 Long term (current) use of oral hypoglycemic drugs: Secondary | ICD-10-CM | POA: Diagnosis not present

## 2023-03-30 DIAGNOSIS — E538 Deficiency of other specified B group vitamins: Secondary | ICD-10-CM

## 2023-03-30 DIAGNOSIS — D696 Thrombocytopenia, unspecified: Secondary | ICD-10-CM | POA: Diagnosis not present

## 2023-03-30 DIAGNOSIS — Z6841 Body Mass Index (BMI) 40.0 and over, adult: Secondary | ICD-10-CM | POA: Diagnosis not present

## 2023-03-30 MED ORDER — MOUNJARO 15 MG/0.5ML ~~LOC~~ SOAJ
15.0000 mg | SUBCUTANEOUS | 0 refills | Status: DC
Start: 2023-03-30 — End: 2023-03-30
  Filled 2023-03-30: qty 2, 28d supply, fill #0

## 2023-03-30 MED ORDER — MOUNJARO 12.5 MG/0.5ML ~~LOC~~ SOAJ
12.5000 mg | SUBCUTANEOUS | 0 refills | Status: DC
Start: 2023-03-30 — End: 2023-04-27
  Filled 2023-03-30 (×2): qty 2, 28d supply, fill #0

## 2023-04-06 ENCOUNTER — Encounter: Payer: Self-pay | Admitting: Family Medicine

## 2023-04-06 DIAGNOSIS — M25521 Pain in right elbow: Secondary | ICD-10-CM

## 2023-04-06 MED ORDER — MELOXICAM 15 MG PO TABS
15.0000 mg | ORAL_TABLET | Freq: Every day | ORAL | 0 refills | Status: DC
Start: 2023-04-06 — End: 2023-05-25
  Filled 2023-04-06: qty 15, 15d supply, fill #0

## 2023-04-07 ENCOUNTER — Other Ambulatory Visit: Payer: Self-pay

## 2023-04-07 ENCOUNTER — Other Ambulatory Visit (HOSPITAL_COMMUNITY): Payer: Self-pay

## 2023-04-09 ENCOUNTER — Telehealth: Payer: Self-pay | Admitting: Nurse Practitioner

## 2023-04-09 DIAGNOSIS — R197 Diarrhea, unspecified: Secondary | ICD-10-CM

## 2023-04-09 NOTE — Progress Notes (Signed)
Based on what you shared with me it looks like you have diarrhea,that should be evaluated in a face to face office visit. All I can give you is imodium AD. If that has not helped you will need to see your PCP and have some stool cultures done. You should continue imodium until you can see them.  NOTE: There will be NO CHARGE for this eVisit   If you are having a true medical emergency please call 911.      For an urgent face to face visit, [place] has six urgent care centers for your convenience:     [facility] at [facility] Directions [phone] [address] [place], [place] [address]    [facility]) Get Driving Directions [phone] [address] [place], [place] [address]  [facility] Center [facility]) Get Driving Directions [phone] [address] [place],  [place]  [address]  [facility] at [facility] Get Driving Directions [phone] [address] [place], [place] [address]   [facility] Health Urgent Care at [facility] Get Driving Directions  [phone] [address] [place], [place] [address]   [facility] Urgent Care at [facility] Directions [phone] [address] [place], [place] [address]  Your MyChart E-visit questionnaire answers were reviewed by a board certified advanced clinical practitioner to complete your personal care plan based on your specific symptoms.  Thank you for using e-Visits.

## 2023-04-17 NOTE — Progress Notes (Unsigned)
   OVETTA SPEISER 01-23-1956 [id number]   History:  67 y.o. G0 presents for annual exam. Postmenopausal - no HRT. S/P TAH BSO at age 32 for severe endometriosis. Normal pap history. H/O diabetes.   Gynecologic History Patient's last menstrual period was 10/18/1982.   Contraception/Family planning: post menopausal status Sexually active:  No  Health Maintenance Last Pap: 2011 Last mammogram: 09/03/2022. Results were: Normal Last colonoscopy: 02/20/2018. Results were: Benign polyp Last Dexa: 09/30/2017. Results were: T-score -1.3  Past medical history, past surgical history, family history and social history were all reviewed and documented in the EPIC chart.  ROS:  A ROS was performed and pertinent positives and negatives are included.  Exam:  There were no vitals filed for this visit. There is no height or weight on file to calculate BMI.  General appearance:  Normal Thyroid:  Symmetrical, normal in size, without palpable masses or nodularity. Respiratory  Auscultation:  Clear without wheezing or rhonchi Cardiovascular  Auscultation:  Regular rate, without rubs, murmurs or gallops  Edema/varicosities:  Not grossly evident Abdominal  Soft,nontender, without masses, guarding or rebound.  Liver/spleen:  No organomegaly noted  Hernia:  None appreciated  Skin  Inspection:  Grossly normal Breasts: Examined lying and sitting.   Right: Without masses, retractions, nipple discharge or axillary adenopathy.   Left: Without masses, retractions, nipple discharge or axillary adenopathy. Genitourinary   Inguinal/mons:  Normal without inguinal adenopathy  External genitalia:  Normal appearing vulva with no masses, tenderness, or lesions  BUS/Urethra/Skene's glands:  Normal  Vagina:  Normal appearing with normal color and discharge, no lesions  Cervix:  And uterus absent  Adnexa/parametria:     Rt: Normal in size, without masses or tenderness.   Lt: Normal in size, without masses  or tenderness.  Anus and perineum: Normal  Digital rectal exam: Deferred  Patient informed chaperone available to be present for breast and pelvic exam. Patient has requested no chaperone to be present. Patient has been advised what will be completed during breast and pelvic exam.   Assessment/Plan:  67 y.o. G0 for annual exam.    Return in 1 year for annual.   Olivia Mackie DNP, 8:33 AM 04/17/2023

## 2023-04-18 ENCOUNTER — Ambulatory Visit (INDEPENDENT_AMBULATORY_CARE_PROVIDER_SITE_OTHER): Payer: Commercial Managed Care - PPO | Admitting: Nurse Practitioner

## 2023-04-18 ENCOUNTER — Encounter: Payer: Self-pay | Admitting: Nurse Practitioner

## 2023-04-18 VITALS — BP 122/74 | HR 68 | Ht 63.0 in | Wt 232.0 lb

## 2023-04-18 DIAGNOSIS — Z01419 Encounter for gynecological examination (general) (routine) without abnormal findings: Secondary | ICD-10-CM | POA: Diagnosis not present

## 2023-04-18 DIAGNOSIS — M85852 Other specified disorders of bone density and structure, left thigh: Secondary | ICD-10-CM | POA: Diagnosis not present

## 2023-04-18 DIAGNOSIS — Z780 Asymptomatic menopausal state: Secondary | ICD-10-CM

## 2023-04-20 ENCOUNTER — Ambulatory Visit: Payer: Self-pay | Admitting: Family Medicine

## 2023-04-25 NOTE — Progress Notes (Unsigned)
   Rubin Payor, PhD, LAT, ATC acting as a scribe for Clementeen Graham, MD.  GRICELDA BADUA is a 67 y.o. female who presents to [facility] at [facility] today for R elbow pain ongoing since early November. Injury occurred when trying to push down through her arms to boost herself up into a tall bed and R arm "gave out." Pt locates pain to ***  Radiates: Paresthesia: Grip strength: Aggravates: Treatments tried: ice, heat, NSAIDs, meloxicam, Tyelnol,     Pertinent review of systems: ***  Relevant historical information: ***   Exam:  LMP 10/18/1982  General: Well Developed, well nourished, and in no acute distress.   MSK: ***    Lab and Radiology Results No results found for this or any previous visit (from the past 72 hour(s)). No results found.     Assessment and Plan: 67 y.o. female with ***   PDMP not reviewed this encounter. No orders of the defined types were placed in this encounter.  No orders of the defined types were placed in this encounter.    Discussed warning signs or symptoms. Please see discharge instructions. Patient expresses understanding.   ***

## 2023-04-26 ENCOUNTER — Ambulatory Visit (INDEPENDENT_AMBULATORY_CARE_PROVIDER_SITE_OTHER): Payer: Commercial Managed Care - PPO

## 2023-04-26 ENCOUNTER — Other Ambulatory Visit: Payer: Self-pay

## 2023-04-26 ENCOUNTER — Ambulatory Visit: Payer: Commercial Managed Care - PPO | Admitting: Family Medicine

## 2023-04-26 ENCOUNTER — Encounter: Payer: Self-pay | Admitting: Family Medicine

## 2023-04-26 VITALS — BP 130/82 | HR 68 | Ht 63.0 in | Wt 236.2 lb

## 2023-04-26 DIAGNOSIS — M79601 Pain in right arm: Secondary | ICD-10-CM

## 2023-04-26 DIAGNOSIS — M25521 Pain in right elbow: Secondary | ICD-10-CM

## 2023-04-26 NOTE — Patient Instructions (Addendum)
Thank you for coming in today.   I've referred you to Occupational Therapy.  Let us know if you don't hear from them in one week.   Please get an Xray today before you leave   Recheck in about 6 weeks especially if not better.

## 2023-04-27 ENCOUNTER — Encounter (INDEPENDENT_AMBULATORY_CARE_PROVIDER_SITE_OTHER): Payer: Self-pay | Admitting: Physician Assistant

## 2023-04-27 ENCOUNTER — Ambulatory Visit (INDEPENDENT_AMBULATORY_CARE_PROVIDER_SITE_OTHER): Payer: Commercial Managed Care - PPO | Admitting: Physician Assistant

## 2023-04-27 ENCOUNTER — Other Ambulatory Visit (HOSPITAL_COMMUNITY): Payer: Self-pay

## 2023-04-27 VITALS — BP 92/58 | HR 72 | Temp 97.9°F | Ht 63.0 in | Wt 227.0 lb

## 2023-04-27 DIAGNOSIS — E1169 Type 2 diabetes mellitus with other specified complication: Secondary | ICD-10-CM | POA: Diagnosis not present

## 2023-04-27 DIAGNOSIS — Z6841 Body Mass Index (BMI) 40.0 and over, adult: Secondary | ICD-10-CM

## 2023-04-27 DIAGNOSIS — Z7985 Long-term (current) use of injectable non-insulin antidiabetic drugs: Secondary | ICD-10-CM | POA: Diagnosis not present

## 2023-04-27 DIAGNOSIS — E559 Vitamin D deficiency, unspecified: Secondary | ICD-10-CM

## 2023-04-27 DIAGNOSIS — I152 Hypertension secondary to endocrine disorders: Secondary | ICD-10-CM | POA: Diagnosis not present

## 2023-04-27 DIAGNOSIS — R0602 Shortness of breath: Secondary | ICD-10-CM

## 2023-04-27 DIAGNOSIS — E538 Deficiency of other specified B group vitamins: Secondary | ICD-10-CM

## 2023-04-27 DIAGNOSIS — E1159 Type 2 diabetes mellitus with other circulatory complications: Secondary | ICD-10-CM

## 2023-04-27 DIAGNOSIS — E785 Hyperlipidemia, unspecified: Secondary | ICD-10-CM

## 2023-04-27 DIAGNOSIS — E669 Obesity, unspecified: Secondary | ICD-10-CM | POA: Diagnosis not present

## 2023-04-27 MED ORDER — TIRZEPATIDE 15 MG/0.5ML ~~LOC~~ SOAJ
15.0000 mg | SUBCUTANEOUS | 0 refills | Status: DC
Start: 2023-04-27 — End: 2023-07-21
  Filled 2023-04-27: qty 6, 84d supply, fill #0

## 2023-04-27 MED ORDER — ONDANSETRON HCL 4 MG PO TABS
4.0000 mg | ORAL_TABLET | Freq: Three times a day (TID) | ORAL | 0 refills | Status: AC | PRN
Start: 2023-04-27 — End: ?
  Filled 2023-04-27: qty 20, 7d supply, fill #0

## 2023-04-27 NOTE — Progress Notes (Signed)
.smr  Office: [phone]  /  Fax: [phone]  WEIGHT SUMMARY AND BIOMETRICS  Vitals Temp: 97.9 F (36.6 C) BP: (!) 92/58 Pulse Rate: 72 SpO2: 94 %   Anthropometric Measurements Height: 5\' 3"  (1.6 m) Weight: 227 lb (103 kg) BMI (Calculated): 40.22 Weight at Last Visit: 226 lb Weight Lost Since Last Visit: 0 Weight Gained Since Last Visit: 1 lb Starting Weight: 251 lb Total Weight Loss (lbs): 24 lb (10.9 kg)   Body Composition  Body Fat %: 47.7 % Fat Mass (lbs): 108.6 lbs Muscle Mass (lbs): 113 lbs Total Body Water (lbs): 80 lbs Visceral Fat Rating : 16   Other Clinical Data RMR: 1742 Fasting: yes Labs: yes Today's Visit #: 26 Starting Date: 05/20/21     HPI  Chief Complaint: OBESITY  Cindy Carey is here to discuss her progress with her obesity treatment plan. She is on the the Category 2 Plan and keeping a food journal and adhering to recommended goals of 1200 calories and 85 grams of protein and states she is following her eating plan approximately 75 % of the time. She states she is exercising 60 minutes 4-5 times per week.   Interval History:  Since last office visit she is up 1 lb.   Hunger/appetite-not excessive. Frustrated by lack of progress with good adherence to program. Does not feel like Greggory Keen has much effect on appetite.  Struggles with meal planning and prepping/wants easy when tired or working long hours.  Discussed prepared meals from [employer] or meal plan like [employer] for minimal need for prep and cooking.  She did ask about whether she should consider bariatric surgery at this point as she has not   Cravings- denies excessive Stress- manageable. 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  Exercise-[employer] 3-5 days weekly Hydration-adequate ~ 80 oz daily.   Indirect calorimetry was repeated today . VO2 2.45 ml/kg/min. REE of 1742 which is higher than predicted and improved from previous REE of 1038  on 05/20/2021. This is an increase of 704 calories.  Discussed category 2 plan should be appropriate to promote healthy weight loss.   Pharmacotherapy: Mounjaro 12.5 mg weekly. Denies mass in neck, dysphagia, dyspepsia, persistent hoarseness, abdominal pain, or N/V/Constipation or diarrhea. Has annual eye exam. Mood is stable. Random very rare episodes of nausea without association with injection time/date or what she has eaten.   She did express some interest in bariatric surgery today and we briefly discussed surgery today. She does not feel she has made significant progress with either Ozempic or Mounjaro and if not showing improvement on max dose Mounjaro, may be interested in pursuing consultation with bariatric surgery.   TREATMENT PLAN FOR OBESITY:  Recommended Dietary Goals  Cindy Carey is currently in the action stage of change. As such, her goal is to continue weight management plan. She has agreed to the Category 2 Plan and keeping a food journal and adhering to recommended goals of 1200 calories and 85 grams of protein.  Behavioral Intervention  We discussed the following Behavioral Modification Strategies today: increasing lean protein intake, decreasing simple carbohydrates , increasing vegetables, increasing lower glycemic fruits, avoiding skipping meals, increasing water intake, work on meal planning and preparation, work on tracking and journaling calories using tracking application, decreasing eating out or consumption of processed foods, and making healthy choices when eating convenient foods, continue to practice mindfulness when eating, and planning for success.  Additional resources provided today: NA  Recommended Physical Activity Goals  Cindy Carey has been advised to work up to 150 minutes of moderate intensity aerobic activity a week and strengthening exercises 2-3 times per week for cardiovascular health, weight loss maintenance and preservation of muscle mass.   She has agreed to  Continue current level of physical activity    Pharmacotherapy We discussed various medication options to help Cindy Carey with her weight loss efforts and we both agreed to increase Mounjaro to 15 mg once weekly for management of Type 2 diabetes.    Return in about 4 weeks (around 05/25/2023).[name] She was informed of the importance of frequent follow up visits to maximize her success with intensive lifestyle modifications for her multiple health conditions.  PHYSICAL EXAM:  Blood pressure (!) 92/58, pulse 72, temperature 97.9 F (36.6 C), height 5\' 3"  (1.6 m), weight 227 lb (103 kg), last menstrual period 10/18/1982, SpO2 94 %. Body mass index is 40.21 kg/m.  General: She is overweight, cooperative, alert, well developed, and in no acute distress. PSYCH: Has normal mood, affect and thought process.   Cardiovascular: HR 70's regular, BP 92/58 Lungs: Normal breathing effort, no conversational dyspnea.  DIAGNOSTIC DATA REVIEWED:  BMET    Component Value Date/Time   NA 140 03/01/2023 0759   NA 139 11/21/2015 0826   K 5.2 03/01/2023 0759   K 4.7 11/21/2015 0826   CL 104 03/01/2023 0759   CO2 22 03/01/2023 0759   CO2 24 11/21/2015 0826   GLUCOSE 87 03/01/2023 0759   GLUCOSE 96 09/30/2016 0811   GLUCOSE 125 11/21/2015 0826   BUN 10 03/01/2023 0759   BUN 12.5 11/21/2015 0826   CREATININE 0.83 03/01/2023 0759   CREATININE 0.93 09/30/2016 0811   CREATININE 1.0 11/21/2015 0826   CALCIUM 10.0 03/01/2023 0759   CALCIUM 9.6 11/21/2015 0826   GFRNONAA 76 07/09/2020 1037   GFRAA 87 07/09/2020 1037   Lab Results  Component Value Date   HGBA1C 5.4 03/01/2023   HGBA1C 6.7 03/31/2016   Lab Results  Component Value Date   INSULIN 39.3 (H) 03/01/2023   INSULIN 59.1 (H) 07/13/2018   Lab Results  Component Value Date   TSH 2.370 03/01/2023   CBC    Component Value Date/Time   WBC 4.8 03/01/2023 0759   WBC 7.8 09/30/2016 0811   RBC 4.74 03/01/2023 0759   RBC 4.92 09/30/2016 0811   HGB  12.9 03/01/2023 0759   HGB 15.1 11/21/2015 0826   HCT 40.6 03/01/2023 0759   HCT 46.3 11/21/2015 0826   PLT 81 (LL) 03/01/2023 0759   MCV 86 03/01/2023 0759   MCV 95.1 11/21/2015 0826   MCH 27.2 03/01/2023 0759   MCH 32.3 09/30/2016 0811   MCHC 31.8 03/01/2023 0759   MCHC 33.1 09/30/2016 0811   RDW 15.1 03/01/2023 0759   RDW 15.1 (H) 11/21/2015 0826   Iron Studies    Component Value Date/Time   IRON 84 12/07/2017 1619   Lipid Panel     Component Value Date/Time   CHOL 127 03/01/2023 0759   TRIG 82 03/01/2023 0759   HDL 77 03/01/2023 0759   CHOLHDL 2.0 08/03/2022 0755   CHOLHDL 2.3 09/30/2016 0811   VLDL 26 09/30/2016 0811   LDLCALC 34 03/01/2023 0759   Hepatic Function Panel     Component Value Date/Time   PROT 6.7 03/01/2023 0759   PROT 7.2 11/21/2015 0826   ALBUMIN 4.2 03/01/2023 0759   ALBUMIN 3.8 11/21/2015 0826   AST 27 03/01/2023 0759  AST 24 11/21/2015 0826   ALT 20 03/01/2023 0759   ALT 24 11/21/2015 0826   ALKPHOS 122 (H) 03/01/2023 0759   ALKPHOS 75 11/21/2015 0826   BILITOT 0.4 03/01/2023 0759   BILITOT 0.58 11/21/2015 0826      Component Value Date/Time   TSH 2.370 03/01/2023 0759   Nutritional Lab Results  Component Value Date   VD25OH 71.3 03/01/2023   VD25OH 56.6 08/03/2022   VD25OH 41.4 03/23/2019    ASSOCIATED CONDITIONS ADDRESSED TODAY  ASSESSMENT AND PLAN  Problem List Items Addressed This Visit     Diabetes (HCC)   Relevant Medications   tirzepatide (MOUNJARO) 15 MG/0.5ML Pen   ondansetron (ZOFRAN) 4 MG tablet   Hypertension associated with diabetes (HCC)   Relevant Medications   tirzepatide (MOUNJARO) 15 MG/0.5ML Pen   Hyperlipidemia associated with type 2 diabetes mellitus (HCC)   Relevant Medications   tirzepatide (MOUNJARO) 15 MG/0.5ML Pen   Vitamin D deficiency   Obesity (HCC)- Start BMI 46.41   Relevant Medications   tirzepatide (MOUNJARO) 15 MG/0.5ML Pen   SOB (shortness of breath) on exertion - Primary    Other Visit Diagnoses     B12 deficiency          Shortness of breath with exertion:  Repeated IC today. REE 1742 up from previous REE 1038. Up 704 calories.  Plan: Continue Category 2 plan which should promote gradual healthy weight loss.   Type 2 Diabetes Mellitus with other specified complication, without long-term current use of insulin HgbA1c is at goal. Last A1c was 5.4 On ACE. On Statin.  Medication(s): Mounjaro 12.5 mg SQ weekly Denies mass in neck, dysphagia, dyspepsia, persistent hoarseness, abdominal pain, or N/V/Constipation or diarrhea. Has annual eye exam. Mood is stable.  Random rare episodes of nausea/emesis and taking Zofran prn with good result.  Will monitor and if continues needs further evaluation by GI.   Metformin 1000 mg twice daily. Reports no GI side effects with metformin.  Jardiance 25 mg daily.    Lab Results  Component Value Date   HGBA1C 5.4 03/01/2023   HGBA1C 5.4 08/03/2022   HGBA1C 5.8 (H) 12/14/2021   Lab Results  Component Value Date   MICROALBUR 0.3 03/31/2016   LDLCALC 34 03/01/2023   CREATININE 0.83 03/01/2023   No results found for: "GFR"  Plan: Continue and increase dose Mounjaro 15 mg SQ weekly Continue working on nutrition plan to decrease simple carbohydrates, increase lean proteins and exercise to promote weight loss and improve glycemic control. Recheck labs in 2-3 months.   Vitamin D Deficiency Vitamin D is at goal of 50.  Most recent vitamin D level was 71.3. She is on OTC vitamin D 5000 international units  Monday thru Friday each week. Decreased last visit to above dosing as level rising. Reports no N/V or muscle weakness with vitamin D.  Lab Results  Component Value Date   VD25OH 71.3 03/01/2023   VD25OH 56.6 08/03/2022   VD25OH 41.4 03/23/2019    Plan: Continue OTC vitamin D 5000 units Monday- Friday only each week.  Low vitamin D levels can be associated with adiposity and may result in leptin resistance  and weight gain. Also associated with fatigue. Currently on vitamin D supplementation without any adverse effects.  Plan to recheck vitamin D in 2-3 months to optimize supplementation.  Hypertension Hypertension improved, asymptomatic, no significant medication side effects noted, and needs further observation.  Medication(s): lisinopril 10 mg daily Normal renal function. BP lower today.  Patient asymptomatic. Will monitor closely.   BP Readings from Last 3 Encounters:  04/27/23 (!) 92/58  04/26/23 130/82  04/18/23 122/74   Lab Results  Component Value Date   CREATININE 0.83 03/01/2023   CREATININE 0.75 08/03/2022   CREATININE 0.75 12/14/2021   No results found for: "GFR"  Plan: Continue all antihypertensives at current dosages. Monitor for hypotension.  May need to adjust medication. Watch closely on [place] .    ATTESTASTION STATEMENTS:  Reviewed by clinician on day of visit: allergies, medications, problem list, medical history, surgical history, family history, social history, and previous encounter notes.   I have personally spent 50 minutes total time today in preparation, patient care, nutritional counseling and documentation for this visit, including the following: review of clinical lab tests; review of medical tests/procedures/services.      Dawne Casali, PA-C

## 2023-05-02 NOTE — Progress Notes (Signed)
 Right elbow x-ray looks normal to radiology

## 2023-05-17 ENCOUNTER — Encounter: Payer: Commercial Managed Care - PPO | Admitting: Rehabilitative and Restorative Service Providers"

## 2023-05-24 NOTE — Progress Notes (Unsigned)
TeleHealth Visit:  This visit was completed with telemedicine (audio/video) technology. Cindy Carey has verbally consented to this TeleHealth visit. The patient is located at home, the provider is located at home. The participants in this visit include the listed provider and patient. The visit was conducted today via MyChart video.  Cindy Carey is here to discuss her progress with her Cindy treatment plan along with follow-up of her Cindy related diagnoses.   Today's visit was # 27 Starting weight: 251 lbs Starting date: 05/20/21 Weight at last in office visit: 227 lbs on 04/27/23 Total weight loss: 24 lbs at last in office visit on 04/27/23. Today's reported weight (05/25/23): none reported  Nutrition Plan: the Category 2 plan and keeping a food journal with goal of 1200-1300 calories and 85 grams of protein daily - 75% adherence.  Current exercise:   cardio/resistance at [employer] 60 minutes 4-5 times per week.   Interim History:  She has had a 5 pound weight loss over the past year. Total net weight loss since starting her program is 9.5% (24 lbs) over the past 2 years. She is very frustrated with slow weight loss.  Has noticed uncomfortable fullness and nausea during night since Mounjaro dose was increased to 15 mg.   Denies polyphagia.  Says if other people were not eating lunch around her she likely would forget to eat. IC repeated last visit.  RMR increased from 1038 to 1742. Calories average 1000-1100 most days and protein is 85 grams.  She is eating too few calories. No intake of sugar sweetened beverages.  Journaling Consistently:  Yes Meeting protein goals:  Yes Meeting calorie goals:  No   Assessment/Plan:  1. Type 2 Diabetes Mellitus with other specified complication, without long-term current use of insulin HgbA1c is at goal. Last A1c was 5.4 CBGs: 80-110 Episodes of hypoglycemia: no Medication(s): Mounjaro 15 mg SQ weekly, metformin XR 1000 mg twice  daily, Jardiance 25 mg daily. The pharmacy gave her a 90-day supply of Mounjaro when she last picked it up.  Lab Results  Component Value Date   HGBA1C 5.4 03/01/2023   HGBA1C 5.4 08/03/2022   HGBA1C 5.8 (H) 12/14/2021   Lab Results  Component Value Date   MICROALBUR 0.3 03/31/2016   LDLCALC 34 03/01/2023   CREATININE 0.83 03/01/2023   No results found for: "GFR"  Plan: Continue Mounjaro 15 mg SQ weekly, metformin XR 1000 mg twice daily, Jardiance 25 mg daily  2.  Delayed gastric emptying Reports uncomfortable fullness through the night and even into the next morning with accompanying nausea.  Started when [facility] dose increased to 15 mg weekly. Eats dinner 6-7 PM.  Denies reflux.  Plan: This will likely improve with time. Eat dinner at 6 PM rather than 7 PM. Zofran as needed for nausea (has some at home).  3. Morbid Cindy: Current BMI 43 Cindy Carey is currently in the action stage of change. As such, her goal is to continue with weight loss efforts.  She has agreed to the Category 2 plan and keeping a food journal with goal of 1200-1300 calories and 85 grams of protein daily.  1.  Discussed that she should make sure she eats 1200 cal every day given her recent RMR results. 2.  Focus more on protein and make sure she hits 85 g/day.  Exercise goals: Continue current exercise regimen at the gym.  Add an extra walking if possible.  Behavioral modification strategies: increasing lean protein intake, decreasing simple carbohydrates , and planning  for success.  Cindy Carey has agreed to follow-up with our clinic in 4 weeks.  No orders of the defined types were placed in this encounter.   Medications Discontinued During This Encounter  Medication Reason   meloxicam (MOBIC) 15 MG tablet Patient has not taken in last 30 days     No orders of the defined types were placed in this encounter.     Objective:   VITALS: Per patient if applicable, see vitals. GENERAL: Alert and in no  acute distress. CARDIOPULMONARY: No increased WOB. Speaking in clear sentences.  PSYCH: Pleasant and cooperative. Speech normal rate and rhythm. Affect is appropriate. Insight and judgement are appropriate. Attention is focused, linear, and appropriate.  NEURO: Oriented as arrived to appointment on time with no prompting.   Attestation Statements:   Reviewed by clinician on day of visit: allergies, medications, problem list, medical history, surgical history, family history, social history, and previous encounter notes.  Time spent on visit including the items listed below was 30 minutes.  -preparing to see the patient (e.g., review of tests, history, previous notes) -obtaining and/or reviewing separately obtained history -counseling and educating the patient/family/caregiver -documenting clinical information in the electronic or other health record -ordering medications, tests, or procedures -independently interpreting results and communicating results to the patient/ family/caregiver -referring and communicating with other health care professionals  -care coordination   This was prepared with the assistance of Engineer, civil (consulting).  Occasional wrong-word or sound-a-like substitutions may have occurred due to the inherent limitations of voice recognition software.

## 2023-05-25 ENCOUNTER — Other Ambulatory Visit: Payer: Self-pay | Admitting: Family Medicine

## 2023-05-25 ENCOUNTER — Other Ambulatory Visit (HOSPITAL_COMMUNITY): Payer: Self-pay

## 2023-05-25 ENCOUNTER — Encounter (INDEPENDENT_AMBULATORY_CARE_PROVIDER_SITE_OTHER): Payer: Self-pay | Admitting: Family Medicine

## 2023-05-25 ENCOUNTER — Telehealth (INDEPENDENT_AMBULATORY_CARE_PROVIDER_SITE_OTHER): Payer: Commercial Managed Care - PPO | Admitting: Family Medicine

## 2023-05-25 DIAGNOSIS — K30 Functional dyspepsia: Secondary | ICD-10-CM | POA: Diagnosis not present

## 2023-05-25 DIAGNOSIS — E1169 Type 2 diabetes mellitus with other specified complication: Secondary | ICD-10-CM

## 2023-05-25 DIAGNOSIS — I10 Essential (primary) hypertension: Secondary | ICD-10-CM

## 2023-05-25 DIAGNOSIS — Z6841 Body Mass Index (BMI) 40.0 and over, adult: Secondary | ICD-10-CM

## 2023-05-25 DIAGNOSIS — Z7985 Long-term (current) use of injectable non-insulin antidiabetic drugs: Secondary | ICD-10-CM | POA: Diagnosis not present

## 2023-05-25 DIAGNOSIS — Z7984 Long term (current) use of oral hypoglycemic drugs: Secondary | ICD-10-CM | POA: Diagnosis not present

## 2023-05-25 MED ORDER — EMPAGLIFLOZIN 25 MG PO TABS
25.0000 mg | ORAL_TABLET | Freq: Every day | ORAL | 2 refills | Status: DC
Start: 2023-05-25 — End: 2024-02-13
  Filled 2023-05-25: qty 90, 90d supply, fill #0
  Filled 2023-08-23: qty 90, 90d supply, fill #1
  Filled 2023-11-22: qty 90, 90d supply, fill #2

## 2023-05-25 MED ORDER — LISINOPRIL 10 MG PO TABS
10.0000 mg | ORAL_TABLET | Freq: Every day | ORAL | 2 refills | Status: DC
Start: 2023-05-25 — End: 2023-08-23
  Filled 2023-05-25: qty 90, 90d supply, fill #0

## 2023-06-07 ENCOUNTER — Ambulatory Visit: Payer: Commercial Managed Care - PPO | Admitting: Family Medicine

## 2023-06-22 ENCOUNTER — Ambulatory Visit (INDEPENDENT_AMBULATORY_CARE_PROVIDER_SITE_OTHER): Payer: Commercial Managed Care - PPO | Admitting: Physician Assistant

## 2023-07-08 ENCOUNTER — Ambulatory Visit: Payer: Commercial Managed Care - PPO | Admitting: Family Medicine

## 2023-07-09 ENCOUNTER — Telehealth: Payer: Commercial Managed Care - PPO | Admitting: Nurse Practitioner

## 2023-07-09 DIAGNOSIS — R399 Unspecified symptoms and signs involving the genitourinary system: Secondary | ICD-10-CM | POA: Diagnosis not present

## 2023-07-09 MED ORDER — CIPROFLOXACIN HCL 250 MG PO TABS
250.0000 mg | ORAL_TABLET | Freq: Two times a day (BID) | ORAL | 0 refills | Status: AC
Start: 2023-07-09 — End: 2023-07-12

## 2023-07-09 NOTE — Progress Notes (Signed)
I have spent 5 minutes in review of e-visit questionnaire, review and updating patient chart, medical decision making and response to patient.  ° °Jerrell Mangel W Secilia Apps, NP ° °  °

## 2023-07-09 NOTE — Progress Notes (Signed)

## 2023-07-21 ENCOUNTER — Other Ambulatory Visit (HOSPITAL_COMMUNITY): Payer: Self-pay

## 2023-07-21 ENCOUNTER — Other Ambulatory Visit: Payer: Self-pay

## 2023-07-21 ENCOUNTER — Ambulatory Visit (INDEPENDENT_AMBULATORY_CARE_PROVIDER_SITE_OTHER): Payer: Commercial Managed Care - PPO | Admitting: Physician Assistant

## 2023-07-21 ENCOUNTER — Encounter (INDEPENDENT_AMBULATORY_CARE_PROVIDER_SITE_OTHER): Payer: Self-pay | Admitting: Physician Assistant

## 2023-07-21 VITALS — BP 94/65 | HR 67 | Temp 97.7°F | Ht 63.0 in | Wt 221.0 lb

## 2023-07-21 DIAGNOSIS — E669 Obesity, unspecified: Secondary | ICD-10-CM

## 2023-07-21 DIAGNOSIS — E1169 Type 2 diabetes mellitus with other specified complication: Secondary | ICD-10-CM | POA: Diagnosis not present

## 2023-07-21 DIAGNOSIS — I10 Essential (primary) hypertension: Secondary | ICD-10-CM

## 2023-07-21 DIAGNOSIS — E559 Vitamin D deficiency, unspecified: Secondary | ICD-10-CM | POA: Diagnosis not present

## 2023-07-21 DIAGNOSIS — K219 Gastro-esophageal reflux disease without esophagitis: Secondary | ICD-10-CM

## 2023-07-21 DIAGNOSIS — Z6839 Body mass index (BMI) 39.0-39.9, adult: Secondary | ICD-10-CM | POA: Diagnosis not present

## 2023-07-21 MED ORDER — TIRZEPATIDE 15 MG/0.5ML ~~LOC~~ SOAJ
15.0000 mg | SUBCUTANEOUS | 0 refills | Status: DC
  Filled 2023-07-21: qty 6, 84d supply, fill #0

## 2023-07-21 MED ORDER — FAMOTIDINE 20 MG PO TABS
20.0000 mg | ORAL_TABLET | Freq: Two times a day (BID) | ORAL | 0 refills | Status: DC
Start: 2023-07-21 — End: 2023-08-23
  Filled 2023-07-21: qty 60, 30d supply, fill #0

## 2023-07-21 MED ORDER — ONDANSETRON HCL 4 MG PO TABS
4.0000 mg | ORAL_TABLET | Freq: Three times a day (TID) | ORAL | 0 refills | Status: DC | PRN
  Filled 2023-07-21: qty 20, 7d supply, fill #0

## 2023-07-21 NOTE — Progress Notes (Signed)
.[facility]  Office: [phone]  /  Fax: [phone]  WEIGHT SUMMARY AND BIOMETRICS  Vitals Temp: 97.7 F (36.5 C) BP: 94/65 Pulse Rate: 67 SpO2: 97 %   Anthropometric Measurements Height: 5\' 3"  (1.6 m) Weight: 221 lb (100.2 kg) BMI (Calculated): 39.16 Weight at Last Visit: 227 lb Weight Lost Since Last Visit: 6 lb Weight Gained Since Last Visit: 0 Starting Weight: 251 lb Total Weight Loss (lbs): 30 lb (13.6 kg) Peak Weight: 280 lb   Body Composition  Body Fat %: 46.7 % Fat Mass (lbs): 103.6 lbs Muscle Mass (lbs): 112.2 lbs Total Body Water (lbs): 76.8 lbs Visceral Fat Rating : 15   Other Clinical Data Fasting: yes Labs: no Today's Visit #: 27 Starting Date: 05/20/21     HPI  Chief Complaint: OBESITY  Kinaya is here to discuss her progress with her obesity treatment plan. She is on the the Category 2 Plan and keeping a food journal and adhering to recommended goals of 1200-1300 calories and 85 grams of protein and states she is following her eating plan approximately 50 % of the time. She states she is exercising walking/[employer] 35-40 minutes 3-4 times per week.   Interval History:  Since last office visit she is down 6 lbs.   The patient, with a history of obesity and type two diabetes, presents for a routine follow-up of her obesity treatment plan. She reports a new symptom of nocturnal nausea for the past month, which she describes as an overt feeling of sickness, often waking her up around 2 am. The nausea is not associated with any other symptoms such as vomiting, reflux, or changes in bowel habits. The patient denies any changes in her medication regimen, diet, or lifestyle that could explain the new onset of nausea. She also reports external stressors related to family issues and a natural disaster, which may be contributing to her overall health.  Pharmacotherapy: Mounjaro 15 mg weekly and metformin 1000 mg twice dailiy and Jardiance 25 mg daily for  Type 2 diabetes management.   Fasting labs were obtained today.  She was informed we would discuss her lab results at her next visit unless there is a critical issue that needs to be addressed sooner. She agreed to keep her next visit at the agreed upon time to discuss these results.   TREATMENT PLAN FOR OBESITY:  Recommended Dietary Goals  Dari is currently in the action stage of change. As such, her goal is to continue weight management plan. She has agreed to the Category 2 Plan and keeping a food journal and adhering to recommended goals of 1200-1300 calories and 85 grams of protein.  Behavioral Intervention  We discussed the following Behavioral Modification Strategies today: increasing lean protein intake, decreasing simple carbohydrates , increasing vegetables, increasing lower glycemic fruits, avoiding skipping meals, increasing water intake, decreasing eating out or consumption of processed foods, and making healthy choices when eating convenient foods, emotional eating strategies and understanding the difference between hunger signals and cravings, work on managing stress, creating time for self-care and relaxation measures, continue to practice mindfulness when eating, and planning for success.  Additional resources provided today: NA  Recommended Physical Activity Goals  Ruvi has been advised to work up to 150 minutes of moderate intensity aerobic activity a week and strengthening exercises 2-3 times per week for cardiovascular health, weight loss maintenance and preservation of muscle mass.   She has agreed to Continue current level of physical activity    Pharmacotherapy We  discussed various medication options to help Kammi with her weight loss efforts and we both agreed to continue Mounjaro 15 mg weekly, Jardiance 25 mg daily and metformin 1000 mg twice daily for Type 2 diabetes management .    Return in about 4 weeks (around 08/18/2023).[name] She was informed of the importance  of frequent follow up visits to maximize her success with intensive lifestyle modifications for her multiple health conditions.  PHYSICAL EXAM:  Blood pressure 94/65, pulse 67, temperature 97.7 F (36.5 C), height 5\' 3"  (1.6 m), weight 221 lb (100.2 kg), last menstrual period 10/18/1982, SpO2 97%. Body mass index is 39.15 kg/m.  General: She is overweight, cooperative, alert, well developed, and in no acute distress. PSYCH: Has normal mood, affect and thought process.   Cardiovascular: HR 60'sBP 94/65 Lungs: Normal breathing effort, no conversational dyspnea.  DIAGNOSTIC DATA REVIEWED:  BMET    Component Value Date/Time   NA 140 03/01/2023 0759   NA 139 11/21/2015 0826   K 5.2 03/01/2023 0759   K 4.7 11/21/2015 0826   CL 104 03/01/2023 0759   CO2 22 03/01/2023 0759   CO2 24 11/21/2015 0826   GLUCOSE 87 03/01/2023 0759   GLUCOSE 96 09/30/2016 0811   GLUCOSE 125 11/21/2015 0826   BUN 10 03/01/2023 0759   BUN 12.5 11/21/2015 0826   CREATININE 0.83 03/01/2023 0759   CREATININE 0.93 09/30/2016 0811   CREATININE 1.0 11/21/2015 0826   CALCIUM 10.0 03/01/2023 0759   CALCIUM 9.6 11/21/2015 0826   GFRNONAA 76 07/09/2020 1037   GFRAA 87 07/09/2020 1037   Lab Results  Component Value Date   HGBA1C 5.4 03/01/2023   HGBA1C 6.7 03/31/2016   Lab Results  Component Value Date   INSULIN 39.3 (H) 03/01/2023   INSULIN 59.1 (H) 07/13/2018   Lab Results  Component Value Date   TSH 2.370 03/01/2023   CBC    Component Value Date/Time   WBC 4.8 03/01/2023 0759   WBC 7.8 09/30/2016 0811   RBC 4.74 03/01/2023 0759   RBC 4.92 09/30/2016 0811   HGB 12.9 03/01/2023 0759   HGB 15.1 11/21/2015 0826   HCT 40.6 03/01/2023 0759   HCT 46.3 11/21/2015 0826   PLT 81 (LL) 03/01/2023 0759   MCV 86 03/01/2023 0759   MCV 95.1 11/21/2015 0826   MCH 27.2 03/01/2023 0759   MCH 32.3 09/30/2016 0811   MCHC 31.8 03/01/2023 0759   MCHC 33.1 09/30/2016 0811   RDW 15.1 03/01/2023 0759   RDW 15.1  (H) 11/21/2015 0826   Iron Studies    Component Value Date/Time   IRON 84 12/07/2017 1619   Lipid Panel     Component Value Date/Time   CHOL 127 03/01/2023 0759   TRIG 82 03/01/2023 0759   HDL 77 03/01/2023 0759   CHOLHDL 2.0 08/03/2022 0755   CHOLHDL 2.3 09/30/2016 0811   VLDL 26 09/30/2016 0811   LDLCALC 34 03/01/2023 0759   Hepatic Function Panel     Component Value Date/Time   PROT 6.7 03/01/2023 0759   PROT 7.2 11/21/2015 0826   ALBUMIN 4.2 03/01/2023 0759   ALBUMIN 3.8 11/21/2015 0826   AST 27 03/01/2023 0759   AST 24 11/21/2015 0826   ALT 20 03/01/2023 0759   ALT 24 11/21/2015 0826   ALKPHOS 122 (H) 03/01/2023 0759   ALKPHOS 75 11/21/2015 0826   BILITOT 0.4 03/01/2023 0759   BILITOT 0.58 11/21/2015 0826      Component Value Date/Time   TSH  2.370 03/01/2023 0759   Nutritional Lab Results  Component Value Date   VD25OH 71.3 03/01/2023   VD25OH 56.6 08/03/2022   VD25OH 41.4 03/23/2019    ASSOCIATED CONDITIONS ADDRESSED TODAY  ASSESSMENT AND PLAN  Problem List Items Addressed This Visit     Diabetes (HCC) - Primary   Relevant Medications   tirzepatide (MOUNJARO) 15 MG/0.5ML Pen   ondansetron (ZOFRAN) 4 MG tablet   Other Relevant Orders   CBC with Differential/Platelet   CMP14+EGFR   Hemoglobin A1c   Insulin, random   Vitamin D deficiency   Relevant Orders   VITAMIN D 25 Hydroxy (Vit-D Deficiency, Fractures)   Obesity (HCC)- Start BMI 46.41   Relevant Medications   tirzepatide (MOUNJARO) 15 MG/0.5ML Pen   Gastroesophageal reflux disease without esophagitis   Relevant Medications   ondansetron (ZOFRAN) 4 MG tablet   famotidine (PEPCID) 20 MG tablet   BMI 39.0-39.9,adult Current BMI 39.3  Obesity and Type 2 Diabetes Patient has been experiencing nocturnal nausea for the past month. No other symptoms reported. Patient has lost 5 pounds of adipose tissue and maintained muscle mass. Blood sugars are well controlled. -Continue current obesity  treatment management. Continue and refill Mounjaro 15 mg weekly Refill zofran for nausea prn and monitor closely.  -Order CBC, CMAT, A1C, fasting insulin, and vitamin D levels. -Consider reducing Metformin dose if nausea persists. -Increase Famotidine to twice daily to manage nausea. -Encourage strength training as part of weight management strategy.  Hypertension Blood pressure was low at 94/65. Patient is currently on Lisinopril. -Monitor blood pressure and consider reducing Lisinopril dose if low blood pressure persists. Continue to work on nutrition plan to promote weight loss and improve BP control.   Vitamin D Deficiency Vitamin D is at goal of 50.  Most recent vitamin D level was 71.3. She is on OTC vitamin D3 5000 IU daily. Lab Results  Component Value Date   VD25OH 77.5 07/21/2023   VD25OH 71.3 03/01/2023   VD25OH 56.6 08/03/2022    Plan: Continue OTC vitamin D3 5000 IU daily Low vitamin D levels can be associated with adiposity and may result in leptin resistance and weight gain. Also associated with fatigue. Currently on vitamin D supplementation without any adverse effects.   Gastroesophageal reflux disease without esophagitis Symptoms are not well-controlled. Medication: Pepcid 20 mg BID.   Plan: Continue Pepcid Monitor nausea.  Avoid trigger foods (spicy foods, fatty/fried foods, acidic foods, coffee, alcohol). Avoid eating within 3 hours of bedtime. Continue to work on weight loss.  General Health Maintenance -Continue Vitamin D supplementation. -Plan for routine bone density check this month. -Patient received flu shot.  ATTESTASTION STATEMENTS:  Reviewed by clinician on day of visit: allergies, medications, problem list, medical history, surgical history, family history, social history, and previous encounter notes.   I have personally spent 30 minutes total time today in preparation, patient care, nutritional counseling and documentation for this  visit, including the following: review of clinical lab tests; review of medical tests/procedures/services.      Ruchama Kubicek, PA-C

## 2023-07-22 LAB — CMP14+EGFR
ALT: 18 [IU]/L (ref 0–32)
AST: 26 [IU]/L (ref 0–40)
Albumin: 4.2 g/dL (ref 3.9–4.9)
Alkaline Phosphatase: 97 [IU]/L (ref 44–121)
BUN/Creatinine Ratio: 14 (ref 12–28)
BUN: 11 mg/dL (ref 8–27)
Bilirubin Total: 0.6 mg/dL (ref 0.0–1.2)
CO2: 21 mmol/L (ref 20–29)
Calcium: 9.7 mg/dL (ref 8.7–10.3)
Chloride: 105 mmol/L (ref 96–106)
Creatinine, Ser: 0.78 mg/dL (ref 0.57–1.00)
Globulin, Total: 2.3 g/dL (ref 1.5–4.5)
Glucose: 87 mg/dL (ref 70–99)
Potassium: 4.8 mmol/L (ref 3.5–5.2)
Sodium: 139 mmol/L (ref 134–144)
Total Protein: 6.5 g/dL (ref 6.0–8.5)
eGFR: 84 mL/min/{1.73_m2} (ref >59)

## 2023-07-22 LAB — CBC WITH DIFFERENTIAL/PLATELET
Basophils Absolute: 0 10*3/uL (ref 0.0–0.2)
Basos: 0 %
EOS (ABSOLUTE): 0.2 10*3/uL (ref 0.0–0.4)
Eos: 3 %
Hematocrit: 40.1 % (ref 34.0–46.6)
Hemoglobin: 12.2 g/dL (ref 11.1–15.9)
Immature Grans (Abs): 0 10*3/uL (ref 0.0–0.1)
Immature Granulocytes: 0 %
Lymphocytes Absolute: 1.7 10*3/uL (ref 0.7–3.1)
Lymphs: 28 %
MCH: 26.9 pg (ref 26.6–33.0)
MCHC: 30.4 g/dL — ABNORMAL LOW (ref 31.5–35.7)
MCV: 89 fL (ref 79–97)
Monocytes Absolute: 0.5 10*3/uL (ref 0.1–0.9)
Monocytes: 9 %
Neutrophils Absolute: 3.5 10*3/uL (ref 1.4–7.0)
Neutrophils: 60 %
Platelets: 106 10*3/uL — ABNORMAL LOW (ref 150–450)
RBC: 4.53 x10E6/uL (ref 3.77–5.28)
RDW: 15 % (ref 11.7–15.4)
WBC: 5.9 10*3/uL (ref 3.4–10.8)

## 2023-07-22 LAB — VITAMIN D 25 HYDROXY (VIT D DEFICIENCY, FRACTURES): Vit D, 25-Hydroxy: 77.5 ng/mL (ref 30.0–100.0)

## 2023-07-22 LAB — HEMOGLOBIN A1C
Est. average glucose Bld gHb Est-mCnc: 111 mg/dL
Hgb A1c MFr Bld: 5.5 % (ref 4.8–5.6)

## 2023-07-22 LAB — INSULIN, RANDOM: INSULIN: 41.9 u[IU]/mL — ABNORMAL HIGH (ref 2.6–24.9)

## 2023-08-08 ENCOUNTER — Other Ambulatory Visit: Payer: Self-pay | Admitting: Family Medicine

## 2023-08-08 DIAGNOSIS — Z1231 Encounter for screening mammogram for malignant neoplasm of breast: Secondary | ICD-10-CM

## 2023-08-23 ENCOUNTER — Encounter (INDEPENDENT_AMBULATORY_CARE_PROVIDER_SITE_OTHER): Payer: Self-pay | Admitting: Physician Assistant

## 2023-08-23 ENCOUNTER — Other Ambulatory Visit (HOSPITAL_COMMUNITY): Payer: Self-pay

## 2023-08-23 ENCOUNTER — Ambulatory Visit
Admission: RE | Admit: 2023-08-23 | Discharge: 2023-08-23 | Disposition: A | Discharge status: Home / Self Care | Payer: Commercial Managed Care - PPO | Source: Ambulatory Visit | Attending: Nurse Practitioner | Admitting: Nurse Practitioner

## 2023-08-23 ENCOUNTER — Ambulatory Visit (INDEPENDENT_AMBULATORY_CARE_PROVIDER_SITE_OTHER): Payer: Commercial Managed Care - PPO | Admitting: Physician Assistant

## 2023-08-23 VITALS — BP 91/57 | HR 83 | Temp 97.8°F | Ht 63.0 in | Wt 221.0 lb

## 2023-08-23 DIAGNOSIS — Z6839 Body mass index (BMI) 39.0-39.9, adult: Secondary | ICD-10-CM

## 2023-08-23 DIAGNOSIS — Z853 Personal history of malignant neoplasm of breast: Secondary | ICD-10-CM | POA: Diagnosis not present

## 2023-08-23 DIAGNOSIS — E559 Vitamin D deficiency, unspecified: Secondary | ICD-10-CM

## 2023-08-23 DIAGNOSIS — M85852 Other specified disorders of bone density and structure, left thigh: Secondary | ICD-10-CM

## 2023-08-23 DIAGNOSIS — E1159 Type 2 diabetes mellitus with other circulatory complications: Secondary | ICD-10-CM | POA: Diagnosis not present

## 2023-08-23 DIAGNOSIS — E1169 Type 2 diabetes mellitus with other specified complication: Secondary | ICD-10-CM | POA: Diagnosis not present

## 2023-08-23 DIAGNOSIS — K219 Gastro-esophageal reflux disease without esophagitis: Secondary | ICD-10-CM

## 2023-08-23 DIAGNOSIS — Z7985 Long-term (current) use of injectable non-insulin antidiabetic drugs: Secondary | ICD-10-CM

## 2023-08-23 DIAGNOSIS — M8588 Other specified disorders of bone density and structure, other site: Secondary | ICD-10-CM | POA: Diagnosis not present

## 2023-08-23 DIAGNOSIS — Z90722 Acquired absence of ovaries, bilateral: Secondary | ICD-10-CM | POA: Diagnosis not present

## 2023-08-23 DIAGNOSIS — N958 Other specified menopausal and perimenopausal disorders: Secondary | ICD-10-CM | POA: Diagnosis not present

## 2023-08-23 DIAGNOSIS — E669 Obesity, unspecified: Secondary | ICD-10-CM

## 2023-08-23 DIAGNOSIS — I152 Hypertension secondary to endocrine disorders: Secondary | ICD-10-CM | POA: Diagnosis not present

## 2023-08-23 MED ORDER — ONDANSETRON HCL 4 MG PO TABS
4.0000 mg | ORAL_TABLET | Freq: Three times a day (TID) | ORAL | 0 refills | Status: DC | PRN
  Filled 2023-08-23: qty 20, 7d supply, fill #0

## 2023-08-23 MED ORDER — LISINOPRIL 5 MG PO TABS
5.0000 mg | ORAL_TABLET | Freq: Every day | ORAL | 3 refills | Status: DC
  Filled 2023-08-23: qty 30, 30d supply, fill #0

## 2023-08-23 MED ORDER — FAMOTIDINE 20 MG PO TABS
20.0000 mg | ORAL_TABLET | Freq: Two times a day (BID) | ORAL | 0 refills | Status: DC
  Filled 2023-08-23: qty 60, 30d supply, fill #0

## 2023-08-23 NOTE — Progress Notes (Signed)
.[facility]  Office: [phone]  /  Fax: [phone]  WEIGHT SUMMARY AND BIOMETRICS  Vitals Temp: 97.8 F (36.6 C) BP: (!) 91/57 Pulse Rate: 83 SpO2: 96 %   Anthropometric Measurements Height: 5\' 3"  (1.6 m) Weight: 221 lb (100.2 kg) BMI (Calculated): 39.16 Weight at Last Visit: 221 lb Weight Lost Since Last Visit: 0 Weight Gained Since Last Visit: 0 Starting Weight: 251 lb Total Weight Loss (lbs): 30 lb (13.6 kg) Peak Weight: 280 lb   Body Composition  Body Fat %: 46.3 % Fat Mass (lbs): 102.4 lbs Muscle Mass (lbs): 112.8 lbs Total Body Water (lbs): 75 lbs Visceral Fat Rating : 15   Other Clinical Data Fasting: yes Labs: no Today's Visit #: 28 Starting Date: 05/20/21     HPI  Chief Complaint: OBESITY  Cindy Carey is here to discuss her progress with her obesity treatment plan. She is on the the Category 2 Plan and keeping a food journal and adhering to recommended goals of 1200-1300 calories and 85 grams of protein and states she is following her eating plan approximately 75 % of the time. She states she is exercising gym 45  minutes 3 times per week.  Discussed the use of AI scribe software for clinical note transcription with the patient, who gave verbal consent to proceed.  History of Present Illness  /   Interval History:  Since last office visit she maintained her weight.  Bio impedence scale reviewed with the patient:  Muscle mass + 0.6 lbs Adipose mass - 1.2 lbs  The patient, with a history of obesity, type two diabetes, hypertension, and vitamin D deficiency, presents for a follow-up visit.  She reports experiencing transient nausea, which she describes as a sudden feeling of sickness that does not lead to vomiting and passes quickly. T hese episodes can occur at any time of the day, but she notes she is more noticeable at night when she is less busy. She denies any associated symptoms such as shortness of breath or palpitations, and does not believe it is  related to her cardiac health.  Low blood pressure over the last few visits, and notes some light headed feeling, particularly when bending down.She is currently on Lisinopril 10mg  for her hypertension,. In addition to her physical symptoms, the patient is experiencing stress related to her aging parents. She expresses concern about their declining health and the possibility of receiving a call about a fall or other incident. However, she does not report any sleep disturbances or obsessive thoughts related to this stress.   Pharmacotherapy: Mounjaro 15 mg weekly and metformin 1000 mg twice dailiy and Jardiance 25 mg daily for Type 2 diabetes management.   TREATMENT PLAN FOR OBESITY: Total loss of 30 lbs  TBW loss of ~ 12%  Obesity Patient is on Mounjaro and maintaining muscle mass well. Adipose and water weight are decreasing slowly. Hunger is controlled overall. -Continue Mounjaro. -Encourage mindful eating during the holiday season. -Recommend increasing walking activity, possibly using indoor walking videos. Recommended Dietary Goals  Cindy Carey is currently in the action stage of change. As such, her goal is to continue weight management plan. She has agreed to the Category 2 Plan and keeping a food journal and adhering to recommended goals of 1200-1300 calories and 85 grams of protein.  Behavioral Intervention  We discussed the following Behavioral Modification Strategies today: continue to work on maintaining a reduced calorie state, getting the recommended amount of protein, incorporating whole foods, making healthy choices, staying well hydrated  and practicing mindfulness when eating..  Additional resources provided today: NA  Recommended Physical Activity Goals  Cindy Carey has been advised to work up to 150 minutes of moderate intensity aerobic activity a week and strengthening exercises 2-3 times per week for cardiovascular health, weight loss maintenance and preservation of muscle mass.    She has agreed to Think about enjoyable ways to increase daily physical activity and overcoming barriers to exercise and Increase physical activity in their day and reduce sedentary time (increase NEAT).   Pharmacotherapy We discussed various medication options to help Cindy Carey with her weight loss efforts and we both agreed to continue Mounjaro 15 mg weekly, Jardiance 25 mg daily and metformin 1000 mg BID for Type 2 diabetes management.    Return in about 4 weeks (around 09/20/2023).[name] She was informed of the importance of frequent follow up visits to maximize her success with intensive lifestyle modifications for her multiple health conditions.  PHYSICAL EXAM:  Blood pressure (!) 91/57, pulse 83, temperature 97.8 F (36.6 C), height 5\' 3"  (1.6 m), weight 221 lb (100.2 kg), last menstrual period 10/18/1982, SpO2 96%. Body mass index is 39.15 kg/m.  General: She is overweight, cooperative, alert, well developed, and in no acute distress. PSYCH: Has normal mood, affect and thought process.   Cardiovascular: HR 80's BP 91/57 and in this range for past few visits.  Lungs: Normal breathing effort, no conversational dyspnea. Neuro: no focal deficits  DIAGNOSTIC DATA REVIEWED:  BMET    Component Value Date/Time   NA 139 07/21/2023 0813   NA 139 11/21/2015 0826   K 4.8 07/21/2023 0813   K 4.7 11/21/2015 0826   CL 105 07/21/2023 0813   CO2 21 07/21/2023 0813   CO2 24 11/21/2015 0826   GLUCOSE 87 07/21/2023 0813   GLUCOSE 96 09/30/2016 0811   GLUCOSE 125 11/21/2015 0826   BUN 11 07/21/2023 0813   BUN 12.5 11/21/2015 0826   CREATININE 0.78 07/21/2023 0813   CREATININE 0.93 09/30/2016 0811   CREATININE 1.0 11/21/2015 0826   CALCIUM 9.7 07/21/2023 0813   CALCIUM 9.6 11/21/2015 0826   GFRNONAA 76 07/09/2020 1037   GFRAA 87 07/09/2020 1037   Lab Results  Component Value Date   HGBA1C 5.5 07/21/2023   HGBA1C 6.7 03/31/2016   Lab Results  Component Value Date   INSULIN 41.9 (H)  07/21/2023   INSULIN 59.1 (H) 07/13/2018   Lab Results  Component Value Date   TSH 2.370 03/01/2023   CBC    Component Value Date/Time   WBC 5.9 07/21/2023 0813   WBC 7.8 09/30/2016 0811   RBC 4.53 07/21/2023 0813   RBC 4.92 09/30/2016 0811   HGB 12.2 07/21/2023 0813   HGB 15.1 11/21/2015 0826   HCT 40.1 07/21/2023 0813   HCT 46.3 11/21/2015 0826   PLT 106 (L) 07/21/2023 0813   MCV 89 07/21/2023 0813   MCV 95.1 11/21/2015 0826   MCH 26.9 07/21/2023 0813   MCH 32.3 09/30/2016 0811   MCHC 30.4 (L) 07/21/2023 0813   MCHC 33.1 09/30/2016 0811   RDW 15.0 07/21/2023 0813   RDW 15.1 (H) 11/21/2015 0826   Iron Studies    Component Value Date/Time   IRON 84 12/07/2017 1619   Lipid Panel     Component Value Date/Time   CHOL 127 03/01/2023 0759   TRIG 82 03/01/2023 0759   HDL 77 03/01/2023 0759   CHOLHDL 2.0 08/03/2022 0755   CHOLHDL 2.3 09/30/2016 0811   VLDL 26 09/30/2016  0272   LDLCALC 34 03/01/2023 0759   Hepatic Function Panel     Component Value Date/Time   PROT 6.5 07/21/2023 0813   PROT 7.2 11/21/2015 0826   ALBUMIN 4.2 07/21/2023 0813   ALBUMIN 3.8 11/21/2015 0826   AST 26 07/21/2023 0813   AST 24 11/21/2015 0826   ALT 18 07/21/2023 0813   ALT 24 11/21/2015 0826   ALKPHOS 97 07/21/2023 0813   ALKPHOS 75 11/21/2015 0826   BILITOT 0.6 07/21/2023 0813   BILITOT 0.58 11/21/2015 0826      Component Value Date/Time   TSH 2.370 03/01/2023 0759   Nutritional Lab Results  Component Value Date   VD25OH 77.5 07/21/2023   VD25OH 71.3 03/01/2023   VD25OH 56.6 08/03/2022    ASSOCIATED CONDITIONS ADDRESSED TODAY  ASSESSMENT AND PLAN  Problem List Items Addressed This Visit     Diabetes (HCC) - Primary   Relevant Medications   ondansetron (ZOFRAN) 4 MG tablet   lisinopril (ZESTRIL) 5 MG tablet   Hypertension associated with diabetes (HCC)   Relevant Medications   lisinopril (ZESTRIL) 5 MG tablet   Vitamin D deficiency   Obesity (HCC)- Start BMI  46.41   Gastroesophageal reflux disease without esophagitis   Relevant Medications   famotidine (PEPCID) 20 MG tablet   ondansetron (ZOFRAN) 4 MG tablet   BMI 39.0-39.9,adult Current BMI 39.3  Type 2 Diabetes Mellitus with other specified complication, without long-term current use of insulin Labs were reviewed today and discussed with the patient.  HgbA1c is at goal. Last A1c was 5.5 /Insulin level 41.9- remains significantly elevated.  She is on statin therapy- Zocor 40 mg daily.  She is on ACE- lisinopril Medication(s): Mounjaro 15 mg SQ weekly and Jardiance 25 mg daily and metformin 1000 mg BID She is working  on nutrition plan to decrease simple carbohydrates, increase lean proteins and exercise to promote weight loss and improve glycemic control . She is having some intermittent nausea without emesis, ? Related to [facility] vs other medication.  Pepcid increased but has not had improvement or worsening of nausea. It is very transient for the most part and she is not taking Zofran consistently as it is so transient.  She has no other associated symptoms. Coronary calcium score of 36 on study 02/27/2021. May want to repeat if symptoms persist and any concerns for cardiac etiology.  Lab Results  Component Value Date   HGBA1C 5.5 07/21/2023   HGBA1C 5.4 03/01/2023   HGBA1C 5.4 08/03/2022   Lab Results  Component Value Date   MICROALBUR 0.3 03/31/2016   LDLCALC 34 03/01/2023   CREATININE 0.78 07/21/2023   No results found for: "GFR"  Plan: Continue Mounjaro 15 mg SQ weekly- no refill needed this visit.  Zofran prn for nausea- Not using frequently, but will continue to monitor closely. May need to consider decreasing Mounjaro if does not improve, but has made some progress with weight loss over the past few months on increased dosage.  Continue Jardiance 25 mg daily. Renal function normal range and slightly improved GFR. Monitor.  Continue metformin 1000 mg BID- Would like to continue  metformin at current dosage with continued insulin resistance, but may need to decrease dosage if nausea persists.  Continue working  on nutrition plan to decrease simple carbohydrates, increase lean proteins and exercise to promote weight loss and improve glycemic control .   Gastroesophageal Reflux Disease (GERD)/? Delayed gastric emptying:  Patient reports transient nausea, not clearly improved with increased Pepcid.  No other symptoms suggestive of cardiac etiology. May need further evaluation by GI if persists. May need to consider decreasing Mounjaro if does not improve, but has made some progress with weight loss over the past few months on increased dosage.  -Continue Pepcid as currently prescribed. -Consider further evaluation if symptoms persist.  Hypertension Labs were reviewed today and discussed with the patient. Renal function normal range.  Blood pressure has been on the lower side for the past three visits. Patient reports occasional lightheadedness with positional changes. -Reduce/refill Lisinopril from 10mg  to 5mg  daily. -Check blood pressure at home and return to 10mg  daily if consistently high.  Vitamin D Deficiency Vitamin D is at goal of 50.  Most recent vitamin D level was 77.5.- Stable and in goal range of 50-70. Doubt as source of nausea.  She is on OTC vitamin D3 5000 IU daily. And Calcium + D 600-400MG - unit daily. No side effect with OTC vitamin D.  Lab Results  Component Value Date   VD25OH 77.5 07/21/2023   VD25OH 71.3 03/01/2023   VD25OH 56.6 08/03/2022    Plan: Continue OTC vitamin D3 5000 IU dailyand Calcium + D 600-400MG - unit daily. Low vitamin D levels can be associated with adiposity and may result in leptin resistance and weight gain. Also associated with fatigue. Currently on vitamin D supplementation without any adverse effects.  Recheck vitamin D level several times yearly to optimize supplementation/avoid over supplementation.   Follow-up in 1  month (September 27, 2023) to reassess blood pressure and overall progress.  ATTESTASTION STATEMENTS:  Reviewed by clinician on day of visit: allergies, medications, problem list, medical history, surgical history, family history, social history, and previous encounter notes.   I have personally spent 52 minutes total time today in preparation, patient care, nutritional counseling and documentation for this visit, including the following: review of clinical lab tests; review of medical tests/procedures/services.      Hayli Milligan, PA-C

## 2023-09-06 ENCOUNTER — Ambulatory Visit
Admission: RE | Admit: 2023-09-06 | Discharge: 2023-09-06 | Disposition: A | Discharge status: Home / Self Care | Payer: Commercial Managed Care - PPO | Source: Ambulatory Visit

## 2023-09-06 DIAGNOSIS — Z1231 Encounter for screening mammogram for malignant neoplasm of breast: Secondary | ICD-10-CM

## 2023-09-08 ENCOUNTER — Other Ambulatory Visit: Payer: Self-pay | Admitting: Family Medicine

## 2023-09-08 DIAGNOSIS — R928 Other abnormal and inconclusive findings on diagnostic imaging of breast: Secondary | ICD-10-CM

## 2023-09-20 ENCOUNTER — Encounter (HOSPITAL_COMMUNITY): Payer: Self-pay

## 2023-09-20 ENCOUNTER — Other Ambulatory Visit: Payer: Self-pay | Admitting: Family Medicine

## 2023-09-20 ENCOUNTER — Other Ambulatory Visit: Payer: Self-pay

## 2023-09-20 ENCOUNTER — Other Ambulatory Visit (HOSPITAL_COMMUNITY): Payer: Self-pay

## 2023-09-20 DIAGNOSIS — E1169 Type 2 diabetes mellitus with other specified complication: Secondary | ICD-10-CM

## 2023-09-20 MED ORDER — METFORMIN HCL ER 500 MG PO TB24
1000.0000 mg | ORAL_TABLET | Freq: Two times a day (BID) | ORAL | 1 refills | Status: DC
  Filled 2023-09-20 – 2023-09-21 (×2): qty 360, 90d supply, fill #0
  Filled 2023-12-21: qty 360, 90d supply, fill #1

## 2023-09-21 ENCOUNTER — Other Ambulatory Visit: Payer: Self-pay

## 2023-09-21 ENCOUNTER — Other Ambulatory Visit (HOSPITAL_COMMUNITY): Payer: Self-pay

## 2023-09-22 ENCOUNTER — Other Ambulatory Visit: Payer: Self-pay

## 2023-09-27 ENCOUNTER — Other Ambulatory Visit: Payer: Self-pay | Admitting: Family Medicine

## 2023-09-27 ENCOUNTER — Encounter (INDEPENDENT_AMBULATORY_CARE_PROVIDER_SITE_OTHER): Payer: Self-pay | Admitting: Physician Assistant

## 2023-09-27 ENCOUNTER — Ambulatory Visit
Admission: RE | Admit: 2023-09-27 | Discharge: 2023-09-27 | Disposition: A | Discharge status: Home / Self Care | Payer: Commercial Managed Care - PPO | Source: Ambulatory Visit | Attending: Family Medicine | Admitting: Family Medicine

## 2023-09-27 ENCOUNTER — Other Ambulatory Visit: Payer: Self-pay

## 2023-09-27 ENCOUNTER — Other Ambulatory Visit (HOSPITAL_COMMUNITY): Payer: Self-pay

## 2023-09-27 ENCOUNTER — Ambulatory Visit (INDEPENDENT_AMBULATORY_CARE_PROVIDER_SITE_OTHER): Payer: Commercial Managed Care - PPO | Admitting: Physician Assistant

## 2023-09-27 VITALS — BP 97/63 | HR 75 | Temp 97.9°F | Ht 63.0 in | Wt 221.0 lb

## 2023-09-27 DIAGNOSIS — Z7985 Long-term (current) use of injectable non-insulin antidiabetic drugs: Secondary | ICD-10-CM

## 2023-09-27 DIAGNOSIS — R928 Other abnormal and inconclusive findings on diagnostic imaging of breast: Secondary | ICD-10-CM

## 2023-09-27 DIAGNOSIS — E669 Obesity, unspecified: Secondary | ICD-10-CM | POA: Diagnosis not present

## 2023-09-27 DIAGNOSIS — I152 Hypertension secondary to endocrine disorders: Secondary | ICD-10-CM

## 2023-09-27 DIAGNOSIS — N6320 Unspecified lump in the left breast, unspecified quadrant: Secondary | ICD-10-CM

## 2023-09-27 DIAGNOSIS — E1159 Type 2 diabetes mellitus with other circulatory complications: Secondary | ICD-10-CM | POA: Diagnosis not present

## 2023-09-27 DIAGNOSIS — E559 Vitamin D deficiency, unspecified: Secondary | ICD-10-CM

## 2023-09-27 DIAGNOSIS — K219 Gastro-esophageal reflux disease without esophagitis: Secondary | ICD-10-CM

## 2023-09-27 DIAGNOSIS — E1169 Type 2 diabetes mellitus with other specified complication: Secondary | ICD-10-CM

## 2023-09-27 DIAGNOSIS — N6321 Unspecified lump in the left breast, upper outer quadrant: Secondary | ICD-10-CM | POA: Diagnosis not present

## 2023-09-27 MED ORDER — LISINOPRIL 5 MG PO TABS
5.0000 mg | ORAL_TABLET | Freq: Every day | ORAL | 3 refills | Status: DC
  Filled 2023-09-27: qty 30, 30d supply, fill #0
  Filled 2023-10-24: qty 30, 30d supply, fill #1
  Filled 2023-11-22: qty 30, 30d supply, fill #2

## 2023-09-27 MED ORDER — TIRZEPATIDE 15 MG/0.5ML ~~LOC~~ SOAJ
15.0000 mg | SUBCUTANEOUS | 0 refills | Status: DC
  Filled 2023-09-27 – 2023-10-08 (×2): qty 6, 84d supply, fill #0

## 2023-09-27 MED ORDER — FAMOTIDINE 20 MG PO TABS
20.0000 mg | ORAL_TABLET | Freq: Two times a day (BID) | ORAL | 0 refills | Status: DC
  Filled 2023-09-27: qty 60, 30d supply, fill #0

## 2023-09-27 NOTE — Progress Notes (Unsigned)
SUBJECTIVE:  Chief Complaint: Obesity  Interim History: She has maintained her weight loss .  Down total of 30 lbs.  TBW loss of ~12% Cindy Carey, a 67 year old patient with a history of obesity and type 2 diabetes, presents for a follow-up visit regarding her obesity treatment plan. The patient reports maintaining her weight over the past several weeks. She is currently on Mounjaro 15 mg weekly for diabetes management, Lisinopril 5 mg daily for renal preservation, Jardiance 25 mg daily with breakfast for diabetes control, and Metformin 500 mg two tablets twice daily.  The patient reports a recent diagnostic mammogram and ultrasound, with a possible biopsy, which has been causing her stress. This stress has led to nocturnal awakenings and subsequent snacking. She is awaiting the results of these tests.  The patient also reports a decrease in her blood pressure over the past few visits, which led to a reduction in her Lisinopril dosage from 10 mg to 5 mg. She is asymptomatic with regards to her blood pressure.  The patient's blood sugars have been well-controlled, with morning readings around 74 and a post-Thanksgiving peak of 126. She has been taking Pepcid 20 mg twice daily, which she believes has been helpful.  The patient also mentions a recent bone density test, which showed a slight worsening compared to previous results. As a result, she has started on K2 and will repeat the bone density test in two years instead of five.  The patient's current concerns revolve around the results of her recent diagnostic mammogram and ultrasound, and the potential need for a biopsy. She is awaiting these results to make decisions about her future, including potential retirement plans. Cindy Carey is here to discuss her progress with her obesity treatment plan. She is on the Category 2 Plan and keeping a food journal and adhering to recommended goals of 1200-1300 calories and 85 grams of protein and states  she is following her eating plan approximately 50 % of the time. She states she is exercising walking the dog 30-45 minutes 5-7 times per week.   OBJECTIVE: Visit Diagnoses: Problem List Items Addressed This Visit     Diabetes (HCC) - Primary   Relevant Medications   tirzepatide (MOUNJARO) 15 MG/0.5ML Pen   lisinopril (ZESTRIL) 5 MG tablet   Hypertension associated with diabetes (HCC)   Relevant Medications   tirzepatide (MOUNJARO) 15 MG/0.5ML Pen   lisinopril (ZESTRIL) 5 MG tablet   Vitamin D deficiency   Obesity (HCC)- Start BMI 46.41   Relevant Medications   tirzepatide (MOUNJARO) 15 MG/0.5ML Pen   Gastroesophageal reflux disease without esophagitis   Relevant Medications   famotidine (PEPCID) 20 MG tablet   Breast Mass Scheduled for diagnostic mammogram, ultrasound, and likely biopsy today. Significant anxiety due to wait time for diagnostic procedures. Discussed the procedure, potential outcomes, and the importance of follow-up. - Proceed with diagnostic mammogram, ultrasound, and biopsy as scheduled - Follow up on biopsy results and plan further management accordingly  Type 2 Diabetes Mellitus Well-controlled with current medications. Blood sugars stable, recent reading of 74 mg/dL and post-Thanksgiving high of 126 mg/dL. Discussed the importance of regular monitoring and adherence to medication regimen. - Continue current diabetes medications (Mounjaro, Jardiance, metformin) - Monitor blood sugar levels regularly  Obesity Maintained weight over the past several weeks. On Mounjaro 15 mg weekly, Jardiance 25 mg daily, and metformin 500 mg two tablets twice daily for diabetes control. Discussed the importance of continuing the current regimen and monitoring for any side effects. -  Continue Mounjaro 15 mg weekly - Continue Jardiance 25 mg daily - Continue metformin 500 mg two tablets twice daily  Hypertension Blood pressure soft over the past few visits. Lisinopril reduced  from 10 mg to 5 mg daily for renal preservation. No symptoms reported. Discussed the importance of monitoring blood pressure and potential need for medication adjustment. - Continue/refill lisinopril 5 mg daily - Monitor blood pressure regularly  Osteopenia Bone density scan showed slight worsening. Started on K2 and will repeat bone density scan in two years. Discussed the importance of adherence to supplementation and follow-up scans. - Continue K2 supplementation - Repeat bone density scan in two years  Gastroesophageal Reflux Disease (GERD) Improvement with Pepcid 20 mg twice daily after several weeks of use. Discussed the importance of continuing the medication and monitoring for any side effects. - Continue/refill Pepcid 20 mg twice daily  General Health Maintenance Up to date with general health maintenance. - Continue routine health maintenance and screenings  Follow-up - Follow up on October 31, 2023, at 12:30 PM - Refill medications as needed before the next visit. Vitals Temp: 97.9 F (36.6 C) BP: 97/63 Pulse Rate: 75 SpO2: 93 %   Anthropometric Measurements Height: 5\' 3"  (1.6 m) Weight: 221 lb (100.2 kg) BMI (Calculated): 39.16 Weight at Last Visit: 221lb Weight Lost Since Last Visit: 0 Weight Gained Since Last Visit: 0 Starting Weight: 251lb Total Weight Loss (lbs): 30 lb (13.6 kg) Peak Weight: 280lb   Body Composition  Body Fat %: 47 % Fat Mass (lbs): 104.2 lbs Muscle Mass (lbs): 111.6 lbs Total Body Water (lbs): 76.2 lbs Visceral Fat Rating : 15   Other Clinical Data Fasting: yes Labs: no Today's Visit #: 29 Starting Date: 05/20/21     ASSESSMENT AND PLAN:  Diet: Cindy Carey is currently in the action stage of change. As such, her goal is to continue with weight loss efforts. She has agreed to Category 2 Plan and keeping a food journal and adhering to recommended goals of 1200-1300 calories and 85+ grams of protein.  Exercise: Cindy Carey has been  instructed to work up to a goal of 150 minutes of combined cardio and strengthening exercise per week for weight loss and overall health benefits.   Behavior Modification:  We discussed the following Behavioral Modification Strategies today: increasing lean protein intake, decreasing simple carbohydrates, increasing vegetables, increase H2O intake, increase high fiber foods, no skipping meals, holiday eating strategies, and planning for success. We discussed various medication options to help Cindy Carey with her weight loss efforts and we both agreed to continue Mounjaro 15 mg weekly for Type 2 diabetes management.  Return in about 6 weeks (around 11/08/2023).[name] She was informed of the importance of frequent follow up visits to maximize her success with intensive lifestyle modifications for her multiple health conditions.  Attestation Statements:   Reviewed by clinician on day of visit: allergies, medications, problem list, medical history, surgical history, family history, social history, and previous encounter notes.   Time spent on visit including pre-visit chart review and post-visit care and charting was *** minutes.    Mickie Kozikowski, PA-C

## 2023-09-28 ENCOUNTER — Ambulatory Visit
Admission: RE | Admit: 2023-09-28 | Discharge: 2023-09-28 | Disposition: A | Discharge status: Home / Self Care | Payer: Commercial Managed Care - PPO | Source: Ambulatory Visit | Attending: Family Medicine | Admitting: Family Medicine

## 2023-09-28 DIAGNOSIS — Z170 Estrogen receptor positive status [ER+]: Secondary | ICD-10-CM | POA: Diagnosis not present

## 2023-09-28 DIAGNOSIS — C50412 Malignant neoplasm of upper-outer quadrant of left female breast: Secondary | ICD-10-CM | POA: Diagnosis not present

## 2023-09-28 DIAGNOSIS — N6320 Unspecified lump in the left breast, unspecified quadrant: Secondary | ICD-10-CM

## 2023-09-28 DIAGNOSIS — N6321 Unspecified lump in the left breast, upper outer quadrant: Secondary | ICD-10-CM | POA: Diagnosis not present

## 2023-09-28 HISTORY — PX: BREAST BIOPSY: SHX20

## 2023-09-30 LAB — SURGICAL PATHOLOGY

## 2023-10-04 ENCOUNTER — Encounter: Payer: Self-pay | Admitting: Family Medicine

## 2023-10-04 ENCOUNTER — Encounter: Payer: Self-pay | Admitting: Genetic Counselor

## 2023-10-04 ENCOUNTER — Other Ambulatory Visit: Payer: Self-pay | Admitting: Surgery

## 2023-10-04 DIAGNOSIS — Z853 Personal history of malignant neoplasm of breast: Secondary | ICD-10-CM

## 2023-10-05 ENCOUNTER — Other Ambulatory Visit: Payer: Self-pay | Admitting: *Deleted

## 2023-10-05 DIAGNOSIS — D0512 Intraductal carcinoma in situ of left breast: Secondary | ICD-10-CM | POA: Insufficient documentation

## 2023-10-05 NOTE — Progress Notes (Signed)
New Breast Cancer Diagnosis: Left Breast  Histology per Pathology Report: grade 2, DCIS  Receptor Status: ER(positive), PR (negative), Her2-neu (), Ki-(%)   Surgeon and surgical plan, if any:  Dr. Magnus Ivan - Lumpectomy- Unscheduled at this time.   Medical oncologist, treatment if any:    Family History of Breast/Ovarian/Prostate Cancer:   Lymphedema issues, if any:      Pain issues, if any:     SAFETY ISSUES: Prior radiation?  Pacemaker/ICD?  Possible current pregnancy? Hysterectomy Is the patient on methotrexate?   Current Complaints / other details:   -History of an intraductal papilloma with atypical ductal hyperplasia in the left breast removed in 2016. She had 5 years of tamoxifen after that.

## 2023-10-06 ENCOUNTER — Ambulatory Visit
Admission: RE | Admit: 2023-10-06 | Discharge: 2023-10-06 | Disposition: A | Discharge status: Home / Self Care | Payer: Commercial Managed Care - PPO | Source: Ambulatory Visit | Attending: Radiation Oncology | Admitting: Radiation Oncology

## 2023-10-06 ENCOUNTER — Other Ambulatory Visit: Payer: Self-pay

## 2023-10-06 ENCOUNTER — Encounter: Payer: Self-pay | Admitting: Radiation Oncology

## 2023-10-06 ENCOUNTER — Other Ambulatory Visit: Payer: Self-pay | Admitting: Surgery

## 2023-10-06 DIAGNOSIS — Z853 Personal history of malignant neoplasm of breast: Secondary | ICD-10-CM

## 2023-10-06 DIAGNOSIS — D0512 Intraductal carcinoma in situ of left breast: Secondary | ICD-10-CM

## 2023-10-06 NOTE — Progress Notes (Signed)
Radiation Oncology         [phone] ________________________________  Name: Cindy Carey        MRN: [id number]  Date of Service: 10/06/2023 DOB: 12/18/1955  KG:MWNUU, Eve, MD  Abigail Miyamoto, MD     REFERRING PHYSICIAN: Abigail Miyamoto, MD   DIAGNOSIS: The encounter diagnosis was Ductal carcinoma in situ (DCIS) of left breast.   HISTORY OF PRESENT ILLNESS: Cindy Carey is a 67 y.o. female seen in the multidisciplinary breast clinic for a new diagnosis of left breast cancer. The patient was noted to have screening detected mass in the right breast and had a benign papilloma excised in 2016. She returned for diagnostic workup on 09/27/2023 and by ultrasound the mass noted on mammogram was located and measured 6 mm in the 2 o'clock position.  Her axilla was negative for adenopathy.  Biopsy on 09/28/2023 showed intermediate grade DCIS with solid papillary features, the tumor was ER positive, PR negative.  She is seen today to discuss adjuvant therapy for her cancer, and is scheduled to undergo left lumpectomy with Dr. Magnus Ivan on 07/02/2024.  She will be meeting with medical oncology as well in the coming weeks.    PREVIOUS RADIATION THERAPY: No   PAST MEDICAL HISTORY:  Past Medical History:  Diagnosis Date   Anemia    Asthma    Atypical ductal hyperplasia of left breast    Back pain    Blood transfusion without reported diagnosis 1983   CAD (coronary artery disease)    Diabetes mellitus without complication (HCC) 2010   T2DM   Dyspareunia    Exercise-induced asthma    worse in winter   Food allergy    H/O bone density study 2013   H/O cold sores    H/O colonoscopy 2009   History of endometriosis    Hyperlipidemia    Hypertension    Injury of tendon of rotator cuff 2015   gym injury (right)   Joint pain    Knee pain 08/2015   L>R   Migraine with typical aura    Mild CAD 04/04/2018   Mild disease on coronary CT-A 01/2018.   Morbid obesity (HCC)  09/04/2012   BMI 49.4 kg/m^2    Osteoarthritis    Plantar fasciitis, left 2019   Trigger finger of left thumb 08/2018   injected by Dr. Merlyn Lot 09/18/18       PAST SURGICAL HISTORY: Past Surgical History:  Procedure Laterality Date   BREAST BIOPSY  2010   BREAST BIOPSY Left 09/28/2023   [name] LT BREAST BX W LOC DEV 1ST LESION IMG BX SPEC US GUIDE 09/28/2023 [facility]MO[facility]   BREAST LUMPECTOMY  2010   Benign   BREAST LUMPECTOMY WITH RADIOACTIVE SEED LOCALIZATION Left 07/23/2015   Procedure: LEFT BREAST SEED GUIDED EXCISION;  Surgeon: Emelia Loron, MD;  Location: [facility];  Service: General;  Laterality: Left;   BREATH TEK H PYLORI  08/28/2012   Procedure: BREATH TEK H PYLORI;  Surgeon: Valarie Merino, MD;  Location: [facility];  Service: General;  Laterality: N/A;   CHOLECYSTECTOMY  2007   EXPLORATORY LAPAROTOMY  1983   endometriosis   GANGLION CYST EXCISION Left 1999   Foot   TOTAL ABDOMINAL HYSTERECTOMY W/ BILATERAL SALPINGOOPHORECTOMY  1984   endometriosis (and appendectomy)     FAMILY HISTORY:  Family History  Problem Relation Age of Onset   Thyroid disease Mother        hypothyroidism   Heart  disease Mother 62       CABG at 23   Hypertension Mother    Diverticulitis Mother 33       perforation, required surgery   Diabetes Father    Heart attack Father 58   Heart disease Father    Hypertension Father    Peripheral Artery Disease Father        stents at 69, another at 51   Pneumonia Sister    Osteoporosis Maternal Grandmother    Heart failure Maternal Grandmother    Lung cancer Maternal Grandfather    Dementia Paternal Grandmother    Melanoma Paternal Grandmother        melanoma on her foot (not related to death)   Diabetes Paternal Grandfather    Heart disease Paternal Grandfather    Lung cancer Maternal Aunt    Heart failure Maternal Uncle    Lung cancer Maternal Uncle    Congestive Heart Failure Maternal Uncle    Breast cancer  Paternal Aunt        74's   Breast cancer Cousin        mets to lung   Breast cancer Cousin    Congestive Heart Failure Cousin 48   Heart failure Cousin      SOCIAL HISTORY:  reports that she has never smoked. She has never used smokeless tobacco. She reports that she does not drink alcohol and does not use drugs.  The patient is married and lives in [place].  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    ALLERGIES: Bee venom, Shellfish allergy, Invokana [canagliflozin], Sulfamethoxazole-trimethoprim, and Penicillins   MEDICATIONS:  Current Outpatient Medications  Medication Sig Dispense Refill   aspirin EC 81 MG tablet Take 81 mg by mouth daily.     Blood Glucose Monitoring Suppl (BLOOD GLUCOSE MONITOR SYSTEM) w/Device KIT Use to check blood sugar as needed as directed 1 kit 0   Calcium Carbonate-Vit D-Min (CALCIUM 600+D PLUS MINERALS) 600-400 MG-UNIT TABS Take 1 tablet by mouth 2 (two) times daily.     Cholecalciferol (VITAMIN D3) 125 MCG (5000 UT) CAPS Take 1 capsule by mouth daily.     cyclobenzaprine (FLEXERIL) 10 MG tablet Take 1 tablet (10 mg total) by mouth 3 (three) times daily as needed for muscle spasms. 30 tablet 0   empagliflozin (JARDIANCE) 25 MG TABS tablet Take 1 tablet (25 mg total) by mouth daily with breakfast. 90 tablet 2   EPINEPHrine 0.3 mg/0.3 mL IJ SOAJ injection Inject 0.3 mg into the muscle once as needed for anaphylaxis. 2 each 0   famotidine (PEPCID) 20 MG tablet Take 1 tablet (20 mg total) by mouth 2 (two) times daily. 60 tablet 0   glucose blood test strip Test  2 (two) times daily. 100 each 2   Lancets (FREESTYLE) lancets 1 each by Other route 2 (two) times daily. Test twice daily Dx:E11.9 100 each 2   lisinopril (ZESTRIL) 5 MG tablet Take 1 tablet (5 mg total) by mouth daily. 30 tablet 3   Menatetrenone (VITAMIN K2) 100 MCG TABS      metFORMIN (GLUCOPHAGE-XR) 500 MG 24 hr tablet Take 2 tablets (1,000 mg total) by  mouth 2 (two) times daily. 360 tablet 1   NONFORMULARY OR COMPOUNDED ITEM Place 2,500 mcg under the tongue daily.     ondansetron (ZOFRAN) 4 MG tablet Take 1 tablet (4 mg total) by mouth every 8 (eight) hours as needed for nausea or vomiting. 20  tablet 0   promethazine (PHENERGAN) 25 MG tablet Take 1 tablet (25 mg total) by mouth every 8 (eight) hours as needed for nausea or vomiting. 30 tablet 0   simvastatin (ZOCOR) 40 MG tablet Take 1 tablet (40 mg total) by mouth every evening. 90 tablet 3   tirzepatide (MOUNJARO) 15 MG/0.5ML Pen Inject 15 mg into the skin once a week. 6 mL 0   No current facility-administered medications for this encounter.     REVIEW OF SYSTEMS: On review of systems, the patient reports that she is doing pretty well so far in navigating this diagnosis. No breast specific complaints were verbalized.      PHYSICAL EXAM:  Unable to assess given encounter type.  In general this is a well appearing caucasian female in no acute distress. She's alert and oriented x4 and appropriate throughout the examination. Cardiopulmonary assessment is negative for acute distress and she exhibits normal effort. Bilateral breast exam is deferred.    ECOG = 0  0 - Asymptomatic (Fully active, able to carry on all predisease activities without restriction)  1 - Symptomatic but completely ambulatory (Restricted in physically strenuous activity but ambulatory and able to carry out work of a light or sedentary nature. For example, light housework, office work)  2 - Symptomatic, <50% in bed during the day (Ambulatory and capable of all self care but unable to carry out any work activities. Up and about more than 50% of waking hours)  3 - Symptomatic, >50% in bed, but not bedbound (Capable of only limited self-care, confined to bed or chair 50% or more of waking hours)  4 - Bedbound (Completely disabled. Cannot carry on any self-care. Totally confined to bed or chair)  5 - Death   Santiago Glad  MM, Creech RH, Tormey DC, et al. [phone]). "Toxicity and response criteria of the [facility]". Am. Evlyn Clines. Oncol. 5 (6): 649-55    LABORATORY DATA:  Lab Results  Component Value Date   WBC 5.9 07/21/2023   HGB 12.2 07/21/2023   HCT 40.1 07/21/2023   MCV 89 07/21/2023   PLT 106 (L) 07/21/2023   Lab Results  Component Value Date   NA 139 07/21/2023   K 4.8 07/21/2023   CL 105 07/21/2023   CO2 21 07/21/2023   Lab Results  Component Value Date   ALT 18 07/21/2023   AST 26 07/21/2023   ALKPHOS 97 07/21/2023   BILITOT 0.6 07/21/2023      RADIOGRAPHY: [name] LT BREAST BX W LOC DEV 1ST LESION IMG BX SPEC US GUIDE Addendum Date: 09/30/2023 ADDENDUM REPORT: 09/30/2023 13:35 ADDENDUM: Pathology revealed INTERMEDIATE GRADE DUCTAL CARCINOMA WITH SOLID AND PAPILLARY FEATURES of the LEFT breast, 2 o'clock, 10cmfn, (heart clip). Definitive diagnosis cannot be confirmed on biopsy; correlate with excisional specimen. This was found to be concordant by Dr. Amie Portland. Pathology results were discussed with the patient by telephone. The patient reported doing well after the biopsy with minimal tenderness, bleeding and bruising at the site. Post biopsy instructions and care were reviewed and questions were answered. The patient was encouraged to call [facility] for any additional concerns. My direct phone number was provided. Surgical consultation has been arranged with Dr. Carman Ching at [facility] on October 04, 2023. Pathology results reported by Rene Kocher, RN on 09/30/2023. Electronically Signed   By: Amie Portland M.D.   On: 09/30/2023 13:35   Result Date: 09/30/2023 CLINICAL DATA:  Patient presents  for ultrasound-guided core needle biopsy of a left breast mass. EXAM: ULTRASOUND GUIDED LEFT BREAST CORE NEEDLE BIOPSY COMPARISON:  Previous exam(s). PROCEDURE: I met with the patient and we discussed the procedure of ultrasound-guided  biopsy, including benefits and alternatives. We discussed the high likelihood of a successful procedure. We discussed the risks of the procedure, including infection, bleeding, tissue injury, clip migration, and inadequate sampling. Informed written consent was given. The usual time-out protocol was performed immediately prior to the procedure. Lesion quadrant: Upper outer quadrant Using sterile technique and 1% Lidocaine as local anesthetic, under direct ultrasound visualization, a 12 gauge spring-loaded device was used to perform biopsy of the 6 mm mass at 2 o'clock using a inferolateral approach. At the conclusion of the procedure a heart shaped tissue marker clip was deployed into the biopsy cavity. Follow up 2 view mammogram was performed and dictated separately. IMPRESSION: Ultrasound guided biopsy of a left breast mass. No apparent complications. Electronically Signed: By: Amie Portland M.D. On: 09/28/2023 11:07   MM CLIP PLACEMENT LEFT Result Date: 09/28/2023 CLINICAL DATA:  Assess post biopsy marker clip placement following ultrasound-guided core needle biopsy a left breast mass. EXAM: 3D DIAGNOSTIC LEFT MAMMOGRAM POST ULTRASOUND BIOPSY COMPARISON:  Previous exam(s). FINDINGS: 3D Mammographic images were obtained following ultrasound guided biopsy of a left breast mass. The heart shaped post biopsy marker clip lies along the lateral margin of the benign mass on the cc view, where the original suspicious mammographic mass projected along the medial margin. This suggests that the ultrasound mass the mammographic lesion are not correlates. However, on the post clip images, there is significant post procedure hemorrhage that limits this assessment. IMPRESSION: Heart shaped post biopsy marker clip does not correlate convincingly with the suspicious mass that was noted on the screening exam from 09/06/2023. Pathology is benign, six-month follow-up diagnostic left breast mammography and possible ultrasound be  recommended. Final Assessment: Post Procedure Mammograms for Marker Placement Electronically Signed   By: Amie Portland M.D.   On: 09/28/2023 11:28   MM 3D DIAGNOSTIC MAMMOGRAM UNILATERAL LEFT BREAST Result Date: 09/27/2023 CLINICAL DATA:  67 year old female presents for further evaluation of possible LEFT breast mass on screening mammogram. EXAM: DIGITAL DIAGNOSTIC UNILATERAL LEFT MAMMOGRAM WITH TOMOSYNTHESIS AND CAD; ULTRASOUND LEFT BREAST LIMITED TECHNIQUE: Left digital diagnostic mammography and breast tomosynthesis was performed. The images were evaluated with computer-aided detection. ; Targeted ultrasound examination of the left breast was performed. COMPARISON:  Previous exam(s). ACR Breast Density Category b: There are scattered areas of fibroglandular density. FINDINGS: Spot compression views of the LEFT breast demonstrate a persistent 0.8 cm circumscribed mass in the middle depth UPPER-OUTER LEFT breast. A larger adjacent circumscribed oval mass has been stable from 2014 and considered benign. Targeted ultrasound is performed, showing a 0.6 x 0.5 x 0.6 cm irregular hypoechoic mass at the 2 o'clock position of the LEFT breast 10 cm from the nipple. No abnormal LEFT axillary lymph nodes are noted. IMPRESSION: 1. Indeterminate 0.6 cm UPPER-OUTER LEFT breast mass at the 2 o'clock position. Tissue sampling is recommended. No abnormal appearing LEFT axillary lymph nodes. RECOMMENDATION: Ultrasound-guided LEFT breast biopsy, which will be scheduled. I have discussed the findings and recommendations with the patient. If applicable, a reminder letter will be sent to the patient regarding the next appointment. BI-RADS CATEGORY  4: Suspicious. Electronically Signed   By: Harmon Pier M.D.   On: 09/27/2023 15:54   [name] LIMITED ULTRASOUND INCLUDING AXILLA LEFT BREAST  Result Date: 09/27/2023 CLINICAL DATA:  67 year old female presents for further evaluation of possible LEFT breast mass on screening mammogram.  EXAM: DIGITAL DIAGNOSTIC UNILATERAL LEFT MAMMOGRAM WITH TOMOSYNTHESIS AND CAD; ULTRASOUND LEFT BREAST LIMITED TECHNIQUE: Left digital diagnostic mammography and breast tomosynthesis was performed. The images were evaluated with computer-aided detection. ; Targeted ultrasound examination of the left breast was performed. COMPARISON:  Previous exam(s). ACR Breast Density Category b: There are scattered areas of fibroglandular density. FINDINGS: Spot compression views of the LEFT breast demonstrate a persistent 0.8 cm circumscribed mass in the middle depth UPPER-OUTER LEFT breast. A larger adjacent circumscribed oval mass has been stable from 2014 and considered benign. Targeted ultrasound is performed, showing a 0.6 x 0.5 x 0.6 cm irregular hypoechoic mass at the 2 o'clock position of the LEFT breast 10 cm from the nipple. No abnormal LEFT axillary lymph nodes are noted. IMPRESSION: 1. Indeterminate 0.6 cm UPPER-OUTER LEFT breast mass at the 2 o'clock position. Tissue sampling is recommended. No abnormal appearing LEFT axillary lymph nodes. RECOMMENDATION: Ultrasound-guided LEFT breast biopsy, which will be scheduled. I have discussed the findings and recommendations with the patient. If applicable, a reminder letter will be sent to the patient regarding the next appointment. BI-RADS CATEGORY  4: Suspicious. Electronically Signed   By: Harmon Pier M.D.   On: 09/27/2023 15:54       IMPRESSION/PLAN: 1. Intermediate Grade, ER positive DCIS of the left breast. Dr. Mitzi Hansen discusses the pathology findings and reviews the nature of early stage breast disease. The consensus from the breast conference includes breast conservation with lumpectomy. Dr. Mitzi Hansen recommends external radiotherapy to the breast  to reduce risks of local recurrence.  In multidisciplinary breast oncology conference, medical oncology was also in agreement for adjuvant antiestrogen therapy to follow. We discussed the risks, benefits, short, and long  term effects of radiotherapy, as well as the curative intent, and the patient is interested in proceeding. Dr. Mitzi Hansen discusses the delivery and logistics of radiotherapy and anticipates a course of 4 weeks of radiotherapy to the left breast with deep inspiration breath-hold technique. We will see her back a few weeks after surgery to discuss the simulation process and anticipate we starting radiotherapy about 4-6 weeks after surgery.  2. Possible genetic predisposition to malignancy. The patient is a candidate for genetic testing given her personal and family history. She was offered referral to genetics clinic, but would like to consider this and will let us know if she desires referral.    This encounter was provided by telemedicine platform MyChart.  The patient has provided two factor identification and has given verbal consent for this type of encounter and has been advised to only accept a meeting of this type in a secure network environment. The time spent during this encounter was 60 minutes including preparation, discussion, and coordination of the patient's care. The attendants for this meeting include Rober Minion, RN, Dr. Mitzi Hansen, Ronny Bacon  and Princella Ion.  During the encounter,  Rober Minion, RN, Dr. Mitzi Hansen, and Ronny Bacon were located at [facility].  Laurelle Updegraff Hinks was located remotely from work.   The above documentation reflects my direct findings during this shared patient visit. Please see the separate note by Dr. Mitzi Hansen on this date for the remainder of the patient's plan of care.    Osker Mason, [facility]    **Disclaimer: This note was dictated with voice recognition software. Similar sounding words can inadvertently be transcribed and this note may  contain transcription errors which may not have been corrected upon publication of note.**

## 2023-10-08 ENCOUNTER — Encounter: Payer: Self-pay | Admitting: Radiation Oncology

## 2023-10-10 ENCOUNTER — Other Ambulatory Visit (HOSPITAL_COMMUNITY): Payer: Self-pay

## 2023-10-16 ENCOUNTER — Other Ambulatory Visit: Payer: Self-pay | Admitting: Family Medicine

## 2023-10-17 ENCOUNTER — Other Ambulatory Visit (HOSPITAL_COMMUNITY): Payer: Self-pay

## 2023-10-17 MED ORDER — PROMETHAZINE HCL 25 MG PO TABS
25.0000 mg | ORAL_TABLET | Freq: Three times a day (TID) | ORAL | 0 refills | Status: DC | PRN
  Filled 2023-10-17: qty 30, 10d supply, fill #0

## 2023-10-17 NOTE — Telephone Encounter (Signed)
Is this okay to fill? 

## 2023-10-24 NOTE — Telephone Encounter (Signed)
 Has this been addressed and sent to the proper team.  If so, hit done to clear it out please. Lorayne Marek, RN

## 2023-10-25 ENCOUNTER — Encounter (HOSPITAL_BASED_OUTPATIENT_CLINIC_OR_DEPARTMENT_OTHER): Payer: Self-pay | Admitting: Surgery

## 2023-10-25 ENCOUNTER — Other Ambulatory Visit: Payer: Self-pay

## 2023-10-26 ENCOUNTER — Encounter (HOSPITAL_BASED_OUTPATIENT_CLINIC_OR_DEPARTMENT_OTHER)
Admission: RE | Admit: 2023-10-26 | Discharge: 2023-10-26 | Disposition: A | Discharge status: Home / Self Care | Payer: Commercial Managed Care - PPO | Source: Ambulatory Visit | Attending: Surgery | Admitting: Surgery

## 2023-10-26 DIAGNOSIS — Z01818 Encounter for other preprocedural examination: Secondary | ICD-10-CM | POA: Diagnosis not present

## 2023-10-26 LAB — BASIC METABOLIC PANEL
Anion gap: 9 (ref 5–15)
BUN: 12 mg/dL (ref 8–23)
CO2: 21 mmol/L — ABNORMAL LOW (ref 22–32)
Calcium: 9.3 mg/dL (ref 8.9–10.3)
Chloride: 107 mmol/L (ref 98–111)
Creatinine, Ser: 0.69 mg/dL (ref 0.44–1.00)
GFR, Estimated: >60 mL/min (ref >60)
Glucose, Bld: 102 mg/dL — ABNORMAL HIGH (ref 70–99)
Potassium: 4.5 mmol/L (ref 3.5–5.1)
Sodium: 137 mmol/L (ref 135–145)

## 2023-10-26 MED ORDER — CHLORHEXIDINE GLUCONATE CLOTH 2 % EX PADS: 6.0000 | MEDICATED_PAD | Freq: Once | CUTANEOUS | Status: DC

## 2023-10-26 MED ORDER — ENSURE PRE-SURGERY PO LIQD: 296.0000 mL | Freq: Once | ORAL | Status: DC

## 2023-10-26 NOTE — Progress Notes (Signed)
 Gave patient CHG soap with instructions, patient verbalized understanding. Patient given G2 preop drink and instructed to finish by 5:30 am on the morning of surgery. Pt verbalized understanding.

## 2023-10-30 NOTE — Progress Notes (Signed)
 TeleHealth Visit:  This visit was completed with telemedicine (audio/video) technology. Cindy Carey has verbally consented to this TeleHealth visit. The patient is located at home, the provider is located at the provider office. The participants in this visit include the listed provider and patient. The visit was conducted today via MyChart video.  OBESITY Cindy Carey is here to discuss her progress with her obesity treatment plan along with follow-up of her obesity related diagnoses.   Today's visit was # 30 Starting weight: 251 lbs Starting date: 05/20/2021 Weight at last in office visit: 221 lbs on 09/27/23 Total weight loss: 30 lbs at last in office visit on 09/27/23. Today's reported weight (0): none reported  Nutrition Plan: the Category 1 plan and the Category 2 plan - 75% adherence.  Current exercise: none  Interim History:  Cindy Carey is a 68 yo female with a history of T2DM,HTN, dysplipidemia and vitamin D  deficiency who reports she has been diagnosed with DCIS, is scheduled for surgery with Dr. Vernetta.   The patient is currently on Mounjaro  15 mg weekly for T2DM management,  which will be held for a week around the time of surgery due to anesthesiology recommendations.   The patient reports experiencing nocturnal episodes, which have not been as severe recently but still occur. She attributes her disrupted sleep in the past month to stress from her diagnosis. She is are considering that she will need to restart tamoxifen  or another medication after radiation ends, but express concern about potential weight gain, which they experienced during previous tamoxifen  treatment in 2016.  The patient has not yet sought out support groups or resources, partially due to the timing of their diagnosis around the holidays. They have chosen not to disclose their diagnosis to their parents due to their parents' inability to handle additional stress. Their husband is supportive and ready  to assist as needed. Eating all of the food on the plan., Protein intake is as prescribed, Is not drinking sugar sweetened beverages., Is not skipping meals, Meeting calorie goals., Meeting protein goals., Water intake is adequate., Denies polyphagia, and Denies excessive cravings.  Eating all of the prescribed protein: yes -  Skipping meals: No Drinking adequate water: Yes Drinking sugar sweetened beverages: No Hunger controlled: moderately controlled. Cravings controlled:  moderately controlled.   Pharmacotherapy: Cindy Carey is on Mounjaro  15 mg SQ weekly Adverse side effects: Nausea Hunger is moderately controlled.  Cravings are moderately controlled.  Assessment/Plan:  1. Obesity:   2. Type 2 Diabetes Mellitus with other specified complication, without long-term current use of insulin  HgbA1c is at goal. Last A1c was 5.5  Medication(s): Mounjaro  15 mg SQ weekly, Metformin  500 mg twice daily with meals, and Jardiance  25 mg daily.  Denies mass in neck, dysphagia, dyspepsia, persistent hoarseness, abdominal pain, or /V/Constipation or diarrhea. Has annual eye exam. Mood is stable. Some nausea with occasional use of Zofran - refilled today.   Lab Results  Component Value Date   HGBA1C 5.5 07/21/2023   HGBA1C 5.4 03/01/2023   HGBA1C 5.4 08/03/2022   Lab Results  Component Value Date   MICROALBUR 0.3 03/31/2016   LDLCALC 34 03/01/2023   CREATININE 0.69 10/26/2023   No results found for: GFR  Plan: Continue and refill Mounjaro  15 mg SQ weekly with refills Continue metformin  per PCP  Continue Jardiance  per PCP Refill Zofran  for nausea- rarely taking.  She is working  on nutrition plan to decrease simple carbohydrates, increase  lean proteins and exercise to promote weight loss and improve glycemic control . Continue to monitor through surgery and treatments for breast cancer.   3. Ductal Carcinoma In Situ (DCIS) DCIS diagnosed with a high cure rate (90-95%) post-treatment.  Surgery scheduled with Dr. Vernetta, followed by radiation therapy starting February 17th for four weeks. Discussed potential fatigue from radiation and importance of rest and nutrition. Patient concerned about work impact and energy levels during radiation. Discussed holding Mounjaro  around surgery per anesthesiology. Patient concerned about weight gain with tamoxifen , considering Arimidex post-radiation. - Hold Mounjaro  around surgery per anesthesiology - Start radiation therapy on February 17th - Consider video visits or skipping a visit in February due to tight schedule and energy levels - Refill medications as needed - Prescribe Zofran  for nausea when restarting Mounjaro  - Inform oncology about sleep disturbances and consider sleep aid - Ensure adequate protein intake for healing  Post-surgical and Radiation Therapy Management Patient to undergo surgery followed by radiation therapy. Emphasized rest, nutrition, and adequate protein intake for recovery. Patient concerned about weight gain with tamoxifen , considering Arimidex post-radiation. - Discuss post-radiation medication options with oncology, considering patient's history with tamoxifen  and weight gain concerns - Ensure access to oncology navigator and support resources  Follow-up - Schedule virtual visit for February 17th at 9 AM - Monitor progress and adjust follow-up plans as needed - Encourage patient to reach out with any concerns or changes in condition.  Generalized Obesity: Current BMI 39.16  Pharmacotherapy Plan Continue and refill  Mounjaro  15 mg SQ weekly  Cindy Carey is currently in the action stage of change. As such, her goal is to continue with weight loss efforts.  She has agreed to the Category 1 plan and the Category 2 plan.  Exercise goals: Older adults should follow the adult guidelines. When older adults cannot meet the adult guidelines, they should be as physically active as their abilities and conditions will  allow.  Older adults should do exercises that maintain or improve balance if they are at risk of falling.  Older adults should determine their level of effort for physical activity relative to their level of fitness.  Older adults with chronic conditions should understand whether and how their conditions affect their ability to do regular physical activity safely.  Behavioral modification strategies: increasing lean protein intake, decreasing simple carbohydrates , no meal skipping, increase water intake, better snacking choices, planning for success, increasing vegetables, increasing fiber rich foods, emotional eating strategies, avoiding temptations, work on smaller portions, and mindful eating.  Adaley has agreed to follow-up with our clinic in 4 weeks.  No orders of the defined types were placed in this encounter.   Medications Discontinued During This Encounter  Medication Reason   ondansetron  (ZOFRAN ) 4 MG tablet Reorder   tirzepatide  (MOUNJARO ) 15 MG/0.5ML Pen Reorder   tirzepatide  (MOUNJARO ) 15 MG/0.5ML Pen Reorder     Meds ordered this encounter  Medications   DISCONTD: tirzepatide  (MOUNJARO ) 15 MG/0.5ML Pen    Sig: Inject 15 mg into the skin once a week.    Dispense:  6 mL    Refill:  0   ondansetron  (ZOFRAN ) 4 MG tablet    Sig: Take 1 tablet (4 mg total) by mouth every 8 (eight) hours as needed for nausea or vomiting.    Dispense:  20 tablet    Refill:  0   tirzepatide  (MOUNJARO ) 15 MG/0.5ML Pen    Sig: Inject 15 mg into the skin once a week.    Dispense:  6 mL    Refill:  1      Objective:   VITALS: Per patient if applicable, see vitals. GENERAL: Alert and in no acute distress. CARDIOPULMONARY: No increased WOB. Speaking in clear sentences.  PSYCH: Pleasant and cooperative. Speech normal rate and rhythm. Affect is appropriate. Insight and judgement are appropriate. Attention is focused, linear, and appropriate.  NEURO: Oriented as arrived to appointment on time with no  prompting.   Attestation Statements:   Reviewed by clinician on day of visit: allergies, medications, problem list, medical history, surgical history, family history, social history, and previous encounter notes.   Time spent on visit including the items listed below was 38 minutes.  -preparing to see the patient (e.g., review of tests, history, previous notes) -obtaining and/or reviewing separately obtained history -counseling and educating the patient/family/caregiver -documenting clinical information in the electronic or other health record -ordering medications, tests, or procedures -independently interpreting results and communicating results to the patient/ family/caregiver -referring and communicating with other health care professionals  -care coordination  Avelina Mcclurkin,PA-C

## 2023-10-31 ENCOUNTER — Telehealth (INDEPENDENT_AMBULATORY_CARE_PROVIDER_SITE_OTHER): Payer: Commercial Managed Care - PPO | Admitting: Physician Assistant

## 2023-10-31 ENCOUNTER — Encounter (INDEPENDENT_AMBULATORY_CARE_PROVIDER_SITE_OTHER): Payer: Self-pay | Admitting: Physician Assistant

## 2023-10-31 ENCOUNTER — Other Ambulatory Visit (HOSPITAL_COMMUNITY): Payer: Self-pay

## 2023-10-31 DIAGNOSIS — E1159 Type 2 diabetes mellitus with other circulatory complications: Secondary | ICD-10-CM

## 2023-10-31 DIAGNOSIS — Z7985 Long-term (current) use of injectable non-insulin antidiabetic drugs: Secondary | ICD-10-CM | POA: Diagnosis not present

## 2023-10-31 DIAGNOSIS — Z7984 Long term (current) use of oral hypoglycemic drugs: Secondary | ICD-10-CM | POA: Diagnosis not present

## 2023-10-31 DIAGNOSIS — E559 Vitamin D deficiency, unspecified: Secondary | ICD-10-CM | POA: Diagnosis not present

## 2023-10-31 DIAGNOSIS — I152 Hypertension secondary to endocrine disorders: Secondary | ICD-10-CM

## 2023-10-31 DIAGNOSIS — Z6839 Body mass index (BMI) 39.0-39.9, adult: Secondary | ICD-10-CM

## 2023-10-31 DIAGNOSIS — K219 Gastro-esophageal reflux disease without esophagitis: Secondary | ICD-10-CM

## 2023-10-31 DIAGNOSIS — E669 Obesity, unspecified: Secondary | ICD-10-CM

## 2023-10-31 DIAGNOSIS — E1169 Type 2 diabetes mellitus with other specified complication: Secondary | ICD-10-CM

## 2023-10-31 MED ORDER — TIRZEPATIDE 15 MG/0.5ML ~~LOC~~ SOAJ
15.0000 mg | SUBCUTANEOUS | 1 refills | Status: DC
  Filled 2023-10-31: qty 6, 84d supply, fill #0

## 2023-10-31 MED ORDER — ONDANSETRON HCL 4 MG PO TABS
4.0000 mg | ORAL_TABLET | Freq: Three times a day (TID) | ORAL | 0 refills | Status: DC | PRN
  Filled 2023-10-31: qty 20, 7d supply, fill #0

## 2023-10-31 MED ORDER — TIRZEPATIDE 15 MG/0.5ML ~~LOC~~ SOAJ
15.0000 mg | SUBCUTANEOUS | 0 refills | Status: DC
  Filled 2023-10-31: qty 6, 84d supply, fill #0

## 2023-11-01 ENCOUNTER — Ambulatory Visit
Admission: RE | Admit: 2023-11-01 | Discharge: 2023-11-01 | Disposition: A | Discharge status: Home / Self Care | Payer: Commercial Managed Care - PPO | Source: Ambulatory Visit | Attending: Surgery | Admitting: Surgery

## 2023-11-01 DIAGNOSIS — Z853 Personal history of malignant neoplasm of breast: Secondary | ICD-10-CM

## 2023-11-01 DIAGNOSIS — D0512 Intraductal carcinoma in situ of left breast: Secondary | ICD-10-CM | POA: Diagnosis not present

## 2023-11-01 HISTORY — PX: BREAST BIOPSY: SHX20

## 2023-11-01 NOTE — H&P (Signed)
 REFERRING PHYSICIAN: Randol Annabelle LABOR, MD PROVIDER: VICENTA DASIE POLI, MD MRN: [id number] DOB: January 14, 1956  Subjective   Chief Complaint: NEW BREAST CANCER  History of Present Illness: Cindy Carey is a 68 y.o. female who is seen  as an office consultation for evaluation of NEW BREAST CANCER  This is a 68 year old female who was referred here for evaluation of a newly diagnosed ductal carcinoma of the left breast. This was found on screening mammography. She had further mammograms and an ultrasound. This showed a 6 mm mass at the 2 o'clock position of the left breast 10 cm from the nipple. She underwent a biopsy showing a ductal carcinoma. Receptor status is pending. They could not determine whether this was invasive or in situ on this biopsy. She has a prior history of an intraductal papilloma with atypical ductal hyperplasia in the left breast removed in 2016. She had 5 years of tamoxifen  after that. She has otherwise been doing well. She reports her nipple has been inverted since the original surgery in 2016. She is otherwise well without complaints. She tolerated the biopsy well.  Review of Systems: A complete review of systems was obtained from the patient. I have reviewed this information and discussed as appropriate with the patient. See HPI as well for other ROS.  ROS   Medical History: Past Medical History:  Diagnosis Date  Diabetes mellitus without complication (CMS/HHS-HCC)  GERD (gastroesophageal reflux disease)  Hyperlipidemia   There is no problem list on file for this patient.  Past Surgical History:  Procedure Laterality Date  CHOLECYSTECTOMY  EXPLORATORY LAPAROTOMY  Foot surgery  MASTECTOMY PARTIAL / LUMPECTOMY Left  OOPHORECTOMY    Allergies  Allergen Reactions  Invokana  [Canagliflozin ] Itching  Penicillins Hives and Other (See Comments)  NDC Rniz:[id number] NDC Rniz:[id number] NDC Rniz:[id number]   Current Outpatient Medications on File Prior to  Visit  Medication Sig Dispense Refill  aspirin 81 MG EC tablet Take 81 mg by mouth once daily  calcium carbonate-vitamin D3 600 mg-20 mcg (800 unit) Tab Take by mouth  cholecalciferol (VITAMIN D3) 5,000 unit capsule Take 1 capsule by mouth once daily  cyclobenzaprine  (FLEXERIL ) 10 MG tablet Take 10 mg by mouth 3 (three) times daily as needed  empagliflozin  (JARDIANCE ) 25 mg tablet Take 25 mg by mouth daily with breakfast  EPINEPHrine  (EPIPEN ) 0.3 mg/0.3 mL auto-injector Inject 0.3 mg into the muscle once as needed  famotidine  (PEPCID ) 20 MG tablet Take 20 mg by mouth 2 (two) times daily  lisinopriL  (ZESTRIL ) 5 MG tablet Take 5 mg by mouth once daily  metFORMIN  (GLUCOPHAGE -XR) 500 MG XR tablet Take 1,000 mg by mouth 2 (two) times daily  MOUNJARO  15 mg/0.5 mL pen injector Inject 15 mg subcutaneously  ondansetron  (ZOFRAN ) 4 MG tablet Take 4 mg by mouth every 8 (eight) hours as needed  promethazine  (PHENERGAN ) 25 MG tablet Take 25 mg by mouth  simvastatin  (ZOCOR ) 40 MG tablet Take 40 mg by mouth every evening  vitamin K2, MK-4, 100 mcg Tab   No current facility-administered medications on file prior to visit.   Family History  Problem Relation Age of Onset  Coronary Artery Disease (Blocked arteries around heart) Mother  Skin cancer Father  High blood pressure (Hypertension) Father  Hyperlipidemia (Elevated cholesterol) Father  Diabetes Father  Coronary Artery Disease (Blocked arteries around heart) Father    Social History   Tobacco Use  Smoking Status Never  Smokeless Tobacco Never    Social History   Socioeconomic History  Marital status:  Married  Tobacco Use  Smoking status: Never  Smokeless tobacco: Never  Substance and Sexual Activity  Alcohol use: Never  Drug use: Never   Social Drivers of Corporate Investment Banker Strain: Low Risk (03/16/2023)  Received from [facility]  Overall Financial Resource Strain (CARDIA)  Difficulty of Paying Living Expenses: Not very  hard  Food Insecurity: No Food Insecurity (03/16/2023)  Received from [facility]  Hunger Vital Sign  Worried About Running Out of Food in the Last Year: Never true  Ran Out of Food in the Last Year: Never true  Transportation Needs: No Transportation Needs (03/16/2023)  Received from [facility] - Transportation  Lack of Transportation (Medical): No  Lack of Transportation (Non-Medical): No  Stress: No Stress Concern Present (03/16/2023)  Received from [facility] - Occupational Stress Questionnaire  Feeling of Stress : Only a little  Social Connections: Socially Integrated (03/16/2023)  Received from [facility]  Social Connection and Isolation Panel [NHANES]  Frequency of Communication with Friends and Family: Three times a week  Frequency of Social Gatherings with Friends and Family: Twice a week  Attends Religious Services: More than 4 times per year  Active Member of [employer] or Organizations: Yes  Attends Engineer, Structural: More than 4 times per year  Marital Status: Married   Objective:   Vitals:   BP: 122/80  Pulse: 104  Temp: 36.7 C (98.1 F)  SpO2: 98%  Weight: (!) 102.4 kg (225 lb 12.8 oz)  Height: 160 cm (5' 3)  PainSc: 0-No pain  PainLoc: Breast   Body mass index is 40 kg/m.  Physical Exam   She appears well on exam  There is an inverted nipple on the left side. The nipple areolar complex is otherwise unremarkable. She has a well-healed incision at the lateral edge of the areola  There is ecchymosis from the biopsy laterally in the breast but no palpable masses  There is no axillary adenopathy  Labs, Imaging and Diagnostic Testing: Reviewed her mammograms, ultrasound, and pathology results as well as notes in the electronic medical records  Assessment and Plan:   Diagnoses and all orders for this visit:  History of left breast cancer - Ambulatory Referral to Oncology-Medical -  Ambulatory Referral to Radiation Oncology   This is a ductal carcinoma of the left breast. Apparently, pathology cannot tell whether this was invasive or in situ disease based on the biopsy. This may need to be determined on the final excision. Receptor status is also pending. I discussed the diagnosis with the patient. We discussed breast conservation versus mastectomy. She wants to proceed with breast conservation which would be a left breast radioactive seed guided lumpectomy. She is well aware of the procedure having had it in the past for her papilloma. We again discussed the risk which includes but is not limited to bleeding, infection, the need for further procedures if the margins are positive or there is invasive disease and a lymph node biopsy may be recommended. I will also refer her to medical and radiation oncology.

## 2023-11-02 ENCOUNTER — Ambulatory Visit (HOSPITAL_BASED_OUTPATIENT_CLINIC_OR_DEPARTMENT_OTHER): Payer: Commercial Managed Care - PPO | Admitting: Anesthesiology

## 2023-11-02 ENCOUNTER — Ambulatory Visit
Admission: RE | Admit: 2023-11-02 | Discharge: 2023-11-02 | Disposition: A | Discharge status: Home / Self Care | Payer: Commercial Managed Care - PPO | Source: Ambulatory Visit | Attending: Surgery | Admitting: Surgery

## 2023-11-02 ENCOUNTER — Encounter (HOSPITAL_BASED_OUTPATIENT_CLINIC_OR_DEPARTMENT_OTHER): Payer: Self-pay | Admitting: Surgery

## 2023-11-02 ENCOUNTER — Encounter (HOSPITAL_BASED_OUTPATIENT_CLINIC_OR_DEPARTMENT_OTHER)
Admission: RE | Disposition: A | Discharge status: Home / Self Care | Payer: Self-pay | Source: Home / Self Care | Attending: Surgery

## 2023-11-02 ENCOUNTER — Other Ambulatory Visit: Payer: Self-pay

## 2023-11-02 ENCOUNTER — Ambulatory Visit (HOSPITAL_BASED_OUTPATIENT_CLINIC_OR_DEPARTMENT_OTHER)
Admission: RE | Admit: 2023-11-02 | Discharge: 2023-11-02 | Disposition: A | Discharge status: Home / Self Care | Payer: Commercial Managed Care - PPO | Attending: Surgery | Admitting: Surgery

## 2023-11-02 DIAGNOSIS — Z7985 Long-term (current) use of injectable non-insulin antidiabetic drugs: Secondary | ICD-10-CM | POA: Insufficient documentation

## 2023-11-02 DIAGNOSIS — C50412 Malignant neoplasm of upper-outer quadrant of left female breast: Secondary | ICD-10-CM | POA: Diagnosis not present

## 2023-11-02 DIAGNOSIS — Z7984 Long term (current) use of oral hypoglycemic drugs: Secondary | ICD-10-CM | POA: Insufficient documentation

## 2023-11-02 DIAGNOSIS — Z9012 Acquired absence of left breast and nipple: Secondary | ICD-10-CM | POA: Insufficient documentation

## 2023-11-02 DIAGNOSIS — Z6839 Body mass index (BMI) 39.0-39.9, adult: Secondary | ICD-10-CM | POA: Diagnosis not present

## 2023-11-02 DIAGNOSIS — I2510 Atherosclerotic heart disease of native coronary artery without angina pectoris: Secondary | ICD-10-CM | POA: Diagnosis not present

## 2023-11-02 DIAGNOSIS — I10 Essential (primary) hypertension: Secondary | ICD-10-CM | POA: Insufficient documentation

## 2023-11-02 DIAGNOSIS — Z1722 Progesterone receptor negative status: Secondary | ICD-10-CM | POA: Insufficient documentation

## 2023-11-02 DIAGNOSIS — E66813 Obesity, class 3: Secondary | ICD-10-CM | POA: Diagnosis not present

## 2023-11-02 DIAGNOSIS — C50912 Malignant neoplasm of unspecified site of left female breast: Secondary | ICD-10-CM

## 2023-11-02 DIAGNOSIS — Z170 Estrogen receptor positive status [ER+]: Secondary | ICD-10-CM | POA: Diagnosis not present

## 2023-11-02 DIAGNOSIS — J45909 Unspecified asthma, uncomplicated: Secondary | ICD-10-CM

## 2023-11-02 DIAGNOSIS — E119 Type 2 diabetes mellitus without complications: Secondary | ICD-10-CM | POA: Diagnosis not present

## 2023-11-02 DIAGNOSIS — K219 Gastro-esophageal reflux disease without esophagitis: Secondary | ICD-10-CM | POA: Insufficient documentation

## 2023-11-02 DIAGNOSIS — Z01818 Encounter for other preprocedural examination: Secondary | ICD-10-CM

## 2023-11-02 DIAGNOSIS — D0512 Intraductal carcinoma in situ of left breast: Secondary | ICD-10-CM | POA: Diagnosis not present

## 2023-11-02 DIAGNOSIS — Z853 Personal history of malignant neoplasm of breast: Secondary | ICD-10-CM | POA: Diagnosis not present

## 2023-11-02 HISTORY — PX: BREAST LUMPECTOMY WITH RADIOACTIVE SEED LOCALIZATION: SHX6424

## 2023-11-02 HISTORY — DX: Other specified postprocedural states: Z98.890

## 2023-11-02 LAB — GLUCOSE, CAPILLARY
Glucose-Capillary: 109 mg/dL — ABNORMAL HIGH (ref 70–99)
Glucose-Capillary: 114 mg/dL — ABNORMAL HIGH (ref 70–99)

## 2023-11-02 SURGERY — BREAST LUMPECTOMY WITH RADIOACTIVE SEED LOCALIZATION
Anesthesia: General | Site: Breast | Laterality: Left

## 2023-11-02 MED ORDER — MIDAZOLAM HCL 2 MG/2ML IJ SOLN
INTRAMUSCULAR | Status: AC
  Filled 2023-11-02: qty 2

## 2023-11-02 MED ORDER — ONDANSETRON HCL 4 MG/2ML IJ SOLN
INTRAMUSCULAR | Status: AC
  Filled 2023-11-02: qty 2

## 2023-11-02 MED ORDER — ONDANSETRON HCL 4 MG/2ML IJ SOLN
4.0000 mg | Freq: Once | INTRAMUSCULAR | Status: AC | PRN
  Administered 2023-11-02: 4 mg via INTRAVENOUS

## 2023-11-02 MED ORDER — KETOROLAC TROMETHAMINE 15 MG/ML IJ SOLN
15.0000 mg | Freq: Once | INTRAMUSCULAR | Status: AC | PRN
  Administered 2023-11-02: 15 mg via INTRAVENOUS

## 2023-11-02 MED ORDER — PROPOFOL 10 MG/ML IV BOLUS
INTRAVENOUS | Status: DC | PRN
  Administered 2023-11-02: 200 mg via INTRAVENOUS

## 2023-11-02 MED ORDER — SUGAMMADEX SODIUM 200 MG/2ML IV SOLN
INTRAVENOUS | Status: DC | PRN
  Administered 2023-11-02: 200 mg via INTRAVENOUS

## 2023-11-02 MED ORDER — LIDOCAINE 2% (20 MG/ML) 5 ML SYRINGE
INTRAMUSCULAR | Status: DC | PRN
  Administered 2023-11-02: 60 mg via INTRAVENOUS

## 2023-11-02 MED ORDER — DEXAMETHASONE SODIUM PHOSPHATE 10 MG/ML IJ SOLN
INTRAMUSCULAR | Status: AC
  Filled 2023-11-02: qty 1

## 2023-11-02 MED ORDER — PHENYLEPHRINE 80 MCG/ML (10ML) SYRINGE FOR IV PUSH (FOR BLOOD PRESSURE SUPPORT)
PREFILLED_SYRINGE | INTRAVENOUS | Status: DC | PRN
  Administered 2023-11-02 (×2): 160 ug via INTRAVENOUS

## 2023-11-02 MED ORDER — TRAMADOL HCL 50 MG PO TABS: 50.0000 mg | ORAL_TABLET | Freq: Four times a day (QID) | ORAL | 0 refills | Status: DC | PRN

## 2023-11-02 MED ORDER — ROCURONIUM BROMIDE 10 MG/ML (PF) SYRINGE
PREFILLED_SYRINGE | INTRAVENOUS | Status: DC | PRN
  Administered 2023-11-02: 20 mg via INTRAVENOUS

## 2023-11-02 MED ORDER — FENTANYL CITRATE (PF) 100 MCG/2ML IJ SOLN
INTRAMUSCULAR | Status: DC | PRN
  Administered 2023-11-02 (×2): 50 ug via INTRAVENOUS

## 2023-11-02 MED ORDER — ACETAMINOPHEN 500 MG PO TABS
ORAL_TABLET | ORAL | Status: AC
  Filled 2023-11-02: qty 2

## 2023-11-02 MED ORDER — CIPROFLOXACIN IN D5W 400 MG/200ML IV SOLN
400.0000 mg | INTRAVENOUS | Status: AC
  Administered 2023-11-02: 400 mg via INTRAVENOUS

## 2023-11-02 MED ORDER — BUPIVACAINE-EPINEPHRINE (PF) 0.5% -1:200000 IJ SOLN
INTRAMUSCULAR | Status: AC
  Filled 2023-11-02: qty 120

## 2023-11-02 MED ORDER — KETOROLAC TROMETHAMINE 15 MG/ML IJ SOLN
INTRAMUSCULAR | Status: AC
  Filled 2023-11-02: qty 1

## 2023-11-02 MED ORDER — CIPROFLOXACIN IN D5W 400 MG/200ML IV SOLN
INTRAVENOUS | Status: AC
Start: 2023-11-02 — End: ?
  Filled 2023-11-02: qty 200

## 2023-11-02 MED ORDER — FENTANYL CITRATE (PF) 100 MCG/2ML IJ SOLN: 25.0000 ug | INTRAMUSCULAR | Status: DC | PRN

## 2023-11-02 MED ORDER — ACETAMINOPHEN 500 MG PO TABS
1000.0000 mg | ORAL_TABLET | ORAL | Status: AC
  Administered 2023-11-02: 1000 mg via ORAL

## 2023-11-02 MED ORDER — FENTANYL CITRATE (PF) 100 MCG/2ML IJ SOLN
INTRAMUSCULAR | Status: AC
  Filled 2023-11-02: qty 2

## 2023-11-02 MED ORDER — ONDANSETRON HCL 4 MG/2ML IJ SOLN
INTRAMUSCULAR | Status: DC | PRN
  Administered 2023-11-02: 4 mg via INTRAVENOUS

## 2023-11-02 MED ORDER — PROPOFOL 10 MG/ML IV BOLUS
INTRAVENOUS | Status: AC
  Filled 2023-11-02: qty 20

## 2023-11-02 MED ORDER — BUPIVACAINE-EPINEPHRINE 0.5% -1:200000 IJ SOLN
INTRAMUSCULAR | Status: DC | PRN
  Administered 2023-11-02: 20 mL

## 2023-11-02 MED ORDER — AMISULPRIDE (ANTIEMETIC) 5 MG/2ML IV SOLN: 10.0000 mg | Freq: Once | INTRAVENOUS | Status: DC | PRN

## 2023-11-02 MED ORDER — DEXAMETHASONE SODIUM PHOSPHATE 4 MG/ML IJ SOLN
INTRAMUSCULAR | Status: DC | PRN
  Administered 2023-11-02: 10 mg via INTRAVENOUS

## 2023-11-02 MED ORDER — SODIUM CHLORIDE 0.9 % IV SOLN: INTRAVENOUS | Status: DC | PRN

## 2023-11-02 MED ORDER — LIDOCAINE 2% (20 MG/ML) 5 ML SYRINGE
INTRAMUSCULAR | Status: AC
  Filled 2023-11-02: qty 5

## 2023-11-02 MED ORDER — KETOROLAC TROMETHAMINE 30 MG/ML IJ SOLN
INTRAMUSCULAR | Status: AC
  Filled 2023-11-02: qty 1

## 2023-11-02 MED ORDER — MIDAZOLAM HCL 5 MG/5ML IJ SOLN
INTRAMUSCULAR | Status: DC | PRN
  Administered 2023-11-02: 2 mg via INTRAVENOUS

## 2023-11-02 MED ORDER — LACTATED RINGERS IV SOLN: INTRAVENOUS | Status: DC

## 2023-11-02 SURGICAL SUPPLY — 45 items
APPLIER CLIP 9.375 MED OPEN (MISCELLANEOUS) ×1
BINDER BREAST 3XL (GAUZE/BANDAGES/DRESSINGS) IMPLANT
BINDER BREAST LRG (GAUZE/BANDAGES/DRESSINGS) IMPLANT
BINDER BREAST MEDIUM (GAUZE/BANDAGES/DRESSINGS) IMPLANT
BINDER BREAST XLRG (GAUZE/BANDAGES/DRESSINGS) IMPLANT
BINDER BREAST XXLRG (GAUZE/BANDAGES/DRESSINGS) IMPLANT
BLADE SURG 15 STRL LF DISP TIS (BLADE) ×1 IMPLANT
CANISTER SUC SOCK COL 7IN (MISCELLANEOUS) IMPLANT
CANISTER SUCT 1200ML W/VALVE (MISCELLANEOUS) IMPLANT
CHLORAPREP W/TINT 26 (MISCELLANEOUS) ×1 IMPLANT
CLIP APPLIE 9.375 MED OPEN (MISCELLANEOUS) IMPLANT
COVER BACK TABLE 60X90IN (DRAPES) ×1 IMPLANT
COVER MAYO STAND STRL (DRAPES) ×1 IMPLANT
COVER PROBE CYLINDRICAL 5X96 (MISCELLANEOUS) ×1 IMPLANT
DERMABOND ADVANCED .7 DNX12 (GAUZE/BANDAGES/DRESSINGS) ×1 IMPLANT
DRAPE LAPAROSCOPIC ABDOMINAL (DRAPES) ×1 IMPLANT
DRAPE UTILITY XL STRL (DRAPES) ×1 IMPLANT
ELECT REM PT RETURN 9FT ADLT (ELECTROSURGICAL) ×1
ELECTRODE REM PT RTRN 9FT ADLT (ELECTROSURGICAL) ×1 IMPLANT
GAUZE SPONGE 4X4 12PLY STRL LF (GAUZE/BANDAGES/DRESSINGS) IMPLANT
GLOVE BIO SURGEON STRL SZ 6.5 (GLOVE) IMPLANT
GLOVE BIOGEL PI IND STRL 7.0 (GLOVE) IMPLANT
GLOVE BIOGEL PI IND STRL 7.5 (GLOVE) IMPLANT
GLOVE SURG SIGNA 7.5 PF LTX (GLOVE) ×1 IMPLANT
GLOVE SURG SYN 7.5 E (GLOVE) ×1
GLOVE SURG SYN 7.5 PF PI (GLOVE) IMPLANT
GOWN STRL REUS W/ TWL LRG LVL3 (GOWN DISPOSABLE) ×1 IMPLANT
GOWN STRL REUS W/ TWL XL LVL3 (GOWN DISPOSABLE) ×1 IMPLANT
KIT MARKER MARGIN INK (KITS) ×1 IMPLANT
NDL HYPO 25X1 1.5 SAFETY (NEEDLE) ×1 IMPLANT
NEEDLE HYPO 25X1 1.5 SAFETY (NEEDLE) ×1
NS IRRIG 1000ML POUR BTL (IV SOLUTION) IMPLANT
PACK BASIN DAY SURGERY FS (CUSTOM PROCEDURE TRAY) ×1 IMPLANT
PENCIL SMOKE EVACUATOR (MISCELLANEOUS) ×1 IMPLANT
SLEEVE SCD COMPRESS KNEE MED (STOCKING) ×1 IMPLANT
SPIKE FLUID TRANSFER (MISCELLANEOUS) IMPLANT
SPONGE T-LAP 4X18 ~~LOC~~+RFID (SPONGE) ×1 IMPLANT
SUT MNCRL AB 4-0 PS2 18 (SUTURE) ×1 IMPLANT
SUT SILK 2 0 SH (SUTURE) IMPLANT
SUT VIC AB 3-0 SH 27X BRD (SUTURE) ×1 IMPLANT
SYR CONTROL 10ML LL (SYRINGE) ×1 IMPLANT
TOWEL GREEN STERILE FF (TOWEL DISPOSABLE) ×1 IMPLANT
TRAY FAXITRON CT DISP (TRAY / TRAY PROCEDURE) ×1 IMPLANT
TUBE CONNECTING 20X1/4 (TUBING) IMPLANT
YANKAUER SUCT BULB TIP NO VENT (SUCTIONS) IMPLANT

## 2023-11-02 NOTE — Transfer of Care (Signed)
 Immediate Anesthesia Transfer of Care Note  Patient: Cindy Carey  Procedure(s) Performed: RADIOACTIVE SEED GUIDED LEFT BREAST LUMPECTOMY (Left: Breast)  Patient Location: PACU  Anesthesia Type:General  Level of Consciousness: drowsy  Airway & Oxygen Therapy: Patient Spontanous Breathing and Patient connected to face mask oxygen  Post-op Assessment: Report given to RN and Post -op Vital signs reviewed and stable  Post vital signs: Reviewed and stable  Last Vitals:  Vitals Value Taken Time  BP 134/77 11/02/23 0930  Temp 36.9 C 11/02/23 0928  Pulse 92 11/02/23 0933  Resp 21 11/02/23 0933  SpO2 95 % 11/02/23 0933  Vitals shown include unfiled device data.  Last Pain:  Vitals:   11/02/23 0708  TempSrc: Temporal  PainSc: 0-No pain      Patients Stated Pain Goal: 4 (11/02/23 0708)  Complications: No notable events documented.

## 2023-11-02 NOTE — Anesthesia Procedure Notes (Signed)
 Procedure Name: LMA Insertion Date/Time: 11/02/2023 8:39 AM  Performed by: Darcel Early, CRNAPre-anesthesia Checklist: Patient identified, Emergency Drugs available, Suction available, Patient being monitored and Timeout performed Patient Re-evaluated:Patient Re-evaluated prior to induction Oxygen Delivery Method: Circle system utilized Preoxygenation: Pre-oxygenation with 100% oxygen Induction Type: IV induction Ventilation: Mask ventilation without difficulty LMA: LMA inserted LMA Size: 4.0 Placement Confirmation: positive ETCO2 Tube secured with: Tape Dental Injury: Teeth and Oropharynx as per pre-operative assessment

## 2023-11-02 NOTE — Op Note (Signed)
   Cindy Carey 11/02/2023   Pre-op Diagnosis: LEFT BREAST DUCTAL CARCINOMA     Post-op Diagnosis: SAME  Procedure(s): RADIOACTIVE SEED GUIDED LEFT BREAST LUMPECTOMY  Surgeon(s): [name], MD Maczis, Loura Rower, PA-C  Anesthesia: General  Staff:  Circulator: Altamease Asters, RN Scrub Person: Chrystie Crass A  Estimated Blood Loss: Minimal               Specimens: SENT TO PATH  Indications: This is a 68 year old female who presented with left breast mass on screening mammography.  She underwent a biopsy of mass showing no ductal carcinoma.  It was difficult to tell whether there was an invasive component of the mass to the left breast lumpectomy is strongly recommended  Findings: The radioactive seed and previous biopsy clip were in the specimen.  The posterior margins of the chest wall. There was a separate mass at the inferior margin that I removed separately which appeared consistent with a fibroadenoma.  It was likewise sent to pathology  Procedure: The patient was brought to the operating identifies the correct patient.  She was placed upon on the operating table and general anesthesia was induced.  Her left breast was prepped and draped in the usual sterile fashion.  Using the neoprobe identified the radioactive seed in the upper outer quadrant of the left breast near the axilla.  I anesthetized the skin with Marcaine  and then made a longitudinal incision on the breast with a scalpel.  I then dissected down to the breast tissue with electrocautery.  With the aid of neoprobe I then performed a wide lumpectomy going circumferentially around the signal all the way down to the chest wall.  I then dissected posterior to the signal from the neoprobe to complete the lumpectomy.  Once the specimen was removed I marked all margins with paint.  An x-ray was performed on the specimen confirming that the radioactive seed and previous biopsy clip were in the center of the  specimen.  I then evaluated the lumpectomy cavity.  I could visibly see a small mass at the inferior margin which appeared consistent with a fibroadenoma.  I excised this mass with the cautery and sent it as the inferior margin.  I then achieved hemostasis with the cautery.  I anesthetized the cavity further with Marcaine .  We then closed the subcutaneous tissue with interrupted 3-0 Vicryl sutures and closed the skin with a running 4-0 Monocryl.  Dermabond was then applied.  The patient tolerated the procedure well.  All the counts were correct at the end of the procedure.  The patient was then extubated in the operating room and taken in stable condition to the recovery room.          [name]   Date: 11/02/2023  Time: 9:08 AM

## 2023-11-02 NOTE — Discharge Instructions (Addendum)
 [facility] Office Phone Number [phone]  BREAST BIOPSY/ PARTIAL MASTECTOMY: POST OP INSTRUCTIONS  Always review your discharge instruction sheet given to you by the facility where your surgery was performed.  IF YOU HAVE DISABILITY OR FAMILY LEAVE FORMS, YOU MUST BRING THEM TO THE OFFICE FOR PROCESSING.  DO NOT GIVE THEM TO YOUR DOCTOR.  A prescription for pain medication may be given to you upon discharge.  Take your pain medication as prescribed, if needed.  If narcotic pain medicine is not needed, then you may take acetaminophen  (Tylenol ) or ibuprofen (Advil) as needed. Take your usually prescribed medications unless otherwise directed If you need a refill on your pain medication, please contact your pharmacy.  They will contact our office to request authorization.  Prescriptions will not be filled after 5pm or on week-ends. You should eat very light the first 24 hours after surgery, such as soup, crackers, pudding, etc.  Resume your normal diet the day after surgery. Most patients will experience some swelling and bruising in the breast.  Ice packs and a good support bra will help.  Swelling and bruising can take several days to resolve.  It is common to experience some constipation if taking pain medication after surgery.  Increasing fluid intake and taking a stool softener will usually help or prevent this problem from occurring.  A mild laxative (Milk of Magnesia or Miralax) should be taken according to package directions if there are no bowel movements after 48 hours. Unless discharge instructions indicate otherwise, you may remove your bandages 24-48 hours after surgery, and you may shower at that time.  You may have steri-strips (small skin tapes) in place directly over the incision.  These strips should be left on the skin for 7-10 days.  If your surgeon used skin glue on the incision, you may shower in 24 hours.  The glue will flake off over the next 2-3 weeks.  Any  sutures or staples will be removed at the office during your follow-up visit. ACTIVITIES:  You may resume regular daily activities (gradually increasing) beginning the next day.  Wearing a good support bra or sports bra minimizes pain and swelling.  You may have sexual intercourse when it is comfortable. You may drive when you no longer are taking prescription pain medication, you can comfortably wear a seatbelt, and you can safely maneuver your car and apply brakes. RETURN TO WORK:  ______________________________________________________________________________________ Elene Griffes should see your doctor in the office for a follow-up appointment approximately two weeks after your surgery.  Your doctor's nurse will typically make your follow-up appointment when she calls you with your pathology report.  Expect your pathology report 2-3 business days after your surgery.  You may call to check if you do not hear from us  after three days. OTHER INSTRUCTIONS: YOU MAY SHOWER STARTING TOMORROW ICE PACK, TYLENOL , AND IBUPROFEN ALSO FOR PAIN NO VIGOROUS ACTIVITY FOR ONE WEEK _____________________________________________________________________________________________________________________________________ _____________________________________________________________________________________________________________________________________ _____________________________________________________________________________________________________________________________________  WHEN TO CALL YOUR DOCTOR: Fever over 101.0 Nausea and/or vomiting. Extreme swelling or bruising. Continued bleeding from incision. Increased pain, redness, or drainage from the incision.  The clinic staff is available to answer your questions during regular business hours.  Please don't hesitate to call and ask to speak to one of the nurses for clinical concerns.  If you have a medical emergency, go to the nearest emergency room or call 911.  A  surgeon from [facility] is always on call at the hospital.  For further questions, please visit [email or web address]  Post Anesthesia Home Care Instructions  Activity: Get plenty of rest for the remainder of the day. A responsible individual must stay with you for 24 hours following the procedure.  For the next 24 hours, DO NOT: -Drive a car -Advertising copywriter -Drink alcoholic beverages -Take any medication unless instructed by your physician -Make any legal decisions or sign important papers.  Meals: Start with liquid foods such as gelatin or soup. Progress to regular foods as tolerated. Avoid greasy, spicy, heavy foods. If nausea and/or vomiting occur, drink only clear liquids until the nausea and/or vomiting subsides. Call your physician if vomiting continues.  Special Instructions/Symptoms: Your throat may feel dry or sore from the anesthesia or the breathing tube placed in your throat during surgery. If this causes discomfort, gargle with warm salt water. The discomfort should disappear within 24 hours.  If you had a scopolamine  patch placed behind your ear for the management of post- operative nausea and/or vomiting:  1. The medication in the patch is effective for 72 hours, after which it should be removed.  Wrap patch in a tissue and discard in the trash. Wash hands thoroughly with soap and water. 2. You may remove the patch earlier than 72 hours if you experience unpleasant side effects which may include dry mouth, dizziness or visual disturbances. 3. Avoid touching the patch. Wash your hands with soap and water after contact with the patch.     Next dose of Tylenol  may be given at 1:15pm if needed.

## 2023-11-02 NOTE — Anesthesia Preprocedure Evaluation (Addendum)
 Anesthesia Evaluation  Patient identified by MRN, date of birth, ID band Patient awake    Reviewed: Allergy & Precautions, NPO status , Patient's Chart, lab work & pertinent test results  History of Anesthesia Complications (+) PONV and history of anesthetic complications  Airway Mallampati: II  TM Distance: >3 FB Neck ROM: Full    Dental no notable dental hx.    Pulmonary asthma    Pulmonary exam normal        Cardiovascular hypertension, Pt. on medications + CAD  Normal cardiovascular exam     Neuro/Psych  Headaches  Neuromuscular disease  negative psych ROS   GI/Hepatic Neg liver ROS,GERD  Medicated and Controlled,,  Endo/Other  diabetes, Oral Hypoglycemic Agents  Class 3 obesityPatient on GLP-1 Agonist  Renal/GU negative Renal ROS     Musculoskeletal  (+) Arthritis ,    Abdominal  (+) + obese  Peds  Hematology negative hematology ROS (+)   Anesthesia Other Findings HISTORY OF LEFT BREAST CANCER  Reproductive/Obstetrics                             Anesthesia Physical Anesthesia Plan  ASA: 3  Anesthesia Plan: General   Post-op Pain Management:    Induction: Intravenous  PONV Risk Score and Plan: 4 or greater and Ondansetron , Dexamethasone , Propofol  infusion, Midazolam  and Treatment may vary due to age or medical condition  Airway Management Planned: LMA  Additional Equipment:   Intra-op Plan:   Post-operative Plan: Extubation in OR  Informed Consent: I have reviewed the patients History and Physical, chart, labs and discussed the procedure including the risks, benefits and alternatives for the proposed anesthesia with the patient or authorized representative who has indicated his/her understanding and acceptance.     Dental advisory given  Plan Discussed with: CRNA  Anesthesia Plan Comments:        Anesthesia Quick Evaluation

## 2023-11-02 NOTE — Interval H&P Note (Signed)
 History and Physical Interval Note:no change in H and P  11/02/2023 8:06 AM  Cindy Carey  has presented today for surgery, with the diagnosis of HISTORY OF LEFT BREAST CANCER.  The various methods of treatment have been discussed with the patient and family. After consideration of risks, benefits and other options for treatment, the patient has consented to  Procedure(s) with comments: RADIOACTIVE SEED GUIDED LEFT BREAST LUMPECTOMY (Left) - LMA as a surgical intervention.  The patient's history has been reviewed, patient examined, no change in status, stable for surgery.  I have reviewed the patient's chart and labs.  Questions were answered to the patient's satisfaction.     [name]

## 2023-11-02 NOTE — Anesthesia Postprocedure Evaluation (Signed)
 Anesthesia Post Note  Patient: Cindy Carey  Procedure(s) Performed: RADIOACTIVE SEED GUIDED LEFT BREAST LUMPECTOMY (Left: Breast)     Patient location during evaluation: PACU Anesthesia Type: General Level of consciousness: awake Pain management: pain level controlled Vital Signs Assessment: post-procedure vital signs reviewed and stable Respiratory status: spontaneous breathing, nonlabored ventilation and respiratory function stable Cardiovascular status: blood pressure returned to baseline and stable Postop Assessment: no apparent nausea or vomiting Anesthetic complications: no   No notable events documented.  Last Vitals:  Vitals:   11/02/23 1000 11/02/23 1023  BP: 117/62 131/70  Pulse: 65 64  Resp: 18 16  Temp:  (!) 36.3 C  SpO2: 94% 93%    Last Pain:  Vitals:   11/02/23 1023  TempSrc: Temporal  PainSc: 4                  Sheily Lineman P Molleigh Huot

## 2023-11-03 ENCOUNTER — Encounter (HOSPITAL_BASED_OUTPATIENT_CLINIC_OR_DEPARTMENT_OTHER): Payer: Self-pay | Admitting: Surgery

## 2023-11-03 ENCOUNTER — Encounter: Payer: Self-pay | Admitting: Family Medicine

## 2023-11-03 ENCOUNTER — Telehealth: Payer: Commercial Managed Care - PPO | Admitting: Family Medicine

## 2023-11-03 VITALS — BP 100/70 | Ht 63.0 in | Wt 221.0 lb

## 2023-11-03 DIAGNOSIS — G4700 Insomnia, unspecified: Secondary | ICD-10-CM | POA: Diagnosis not present

## 2023-11-03 LAB — SURGICAL PATHOLOGY

## 2023-11-03 MED ORDER — ZOLPIDEM TARTRATE 5 MG PO TABS: 2.5000 mg | ORAL_TABLET | Freq: Every evening | ORAL | 0 refills | Status: DC | PRN

## 2023-11-03 NOTE — Progress Notes (Signed)
Start time: 12:01 End time: 12:31  Virtual Visit via Video Note  I connected with Cindy Carey on 11/03/23 by a video enabled telemedicine application and verified that I am speaking with the correct person using two identifiers.  Location: Patient: home Provider: office   I discussed the limitations of evaluation and management by telemedicine and the availability of in person appointments. The patient expressed understanding and agreed to proceed.  History of Present Illness:  Chief Complaint  Patient presents with   Insomnia    VIRTUAL patient has been having issues with sleep.    She sent the following message today: " I am having real problems sleeping. I am not scared of the cancer dx but I am overwhelmed with managing all the procedures and appointments. Not to mention, sorting out responsibilities at work and how to pay all the costs. The [employer] is turning out to be pretty terrible in that regard. For example radiation is going to cost me over $7000 out-of-pocket. The other thing is figuring out how to continue to take care of my parents on weekends and not tell them any of this as they are too old and frail to worry about it.  Since end of November , I have been sleeping 2-3 hrs per night. Yesterday was my surgery, and I slept less than 2hrs the night before, then slept a couple hours with anesthesia, got home around 11 AM, slept 1 hour & have been awake since. The short nights have been my pattern since all this started in late November.  My request is for something to help me get some sleep as I navigate through all this. Radiation is supposed to start February 17 and go through March 14. I am told that the biggest side effect is fatigue, and I can't imagine that on top of me not sleeping now.  I am doing good sleep hygiene, not napping during the day, have tried melatonin and then Benadryl without success."  Sees oncologist 1/31. Starts radiation 2/17, and  was told she would be fatigued by the 3rd week.   She has trouble falling asleep. Sometimes wakes up 2-3 hours later, can't get back to sleep. Week of Christmas she took 1/2 of flexeril and slept x 4 hours. 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  Tried melatonin and benadryl. Melatonin helped her fall asleep, but didn't stay asleep, and felt more awake when she awakened. Felt groggy the next day from the benadryl, didn't help her stay sleep during the night. Talked to behavioral health person in her office--tried guided relaxation apps to use. She was told to go to another room and write down what she is worrying about, leave the list in that room, then go to bed.  12/2018 she was rx'd alprazolam--only took 1 pill.  Was afraid to take it, due to being very sensitive to meds.  She is frustrated with this whole process--there was a 3 week delay to get diagnostic mammo after initial abnormal screening mammogram.  Disappointed by front office/[facility]. The costs of her surgery and treatment are significantly higher than expected (this insurance is more costly than prior insurance), which will delay her planned retirement to cover the costs.   PMH, PSH, SH reviewed  Outpatient Encounter Medications as of 11/03/2023  Medication Sig Note   acetaminophen (TYLENOL) 500 MG tablet Take 500 mg by mouth  every 6 (six) hours as needed. 11/03/2023: Took one yesterday afternoon   aspirin EC 81 MG tablet Take 81 mg by mouth daily. 11/03/2023: Restarted today   Blood Glucose Monitoring Suppl (BLOOD GLUCOSE MONITOR SYSTEM) w/Device KIT Use to check blood sugar as needed as directed    Calcium Carbonate-Vit D-Min (CALCIUM 600+D PLUS MINERALS) 600-400 MG-UNIT TABS Take 1 tablet by mouth 2 (two) times daily.    Cholecalciferol (VITAMIN D3) 125 MCG (5000 UT) CAPS Take 1 capsule by mouth daily.    empagliflozin (JARDIANCE)  25 MG TABS tablet Take 1 tablet (25 mg total) by mouth daily with breakfast.    famotidine (PEPCID) 20 MG tablet Take 1 tablet (20 mg total) by mouth 2 (two) times daily.    glucose blood test strip Test  2 (two) times daily.    Lancets (FREESTYLE) lancets 1 each by Other route 2 (two) times daily. Test twice daily Dx:E11.9    lisinopril (ZESTRIL) 5 MG tablet Take 1 tablet (5 mg total) by mouth daily.    Menatetrenone (VITAMIN K2) 100 MCG TABS     metFORMIN (GLUCOPHAGE-XR) 500 MG 24 hr tablet Take 2 tablets (1,000 mg total) by mouth 2 (two) times daily.    NONFORMULARY OR COMPOUNDED ITEM Place 2,500 mcg under the tongue daily. 03/16/2023: Vitamin B12 (contains folic acid)   simvastatin (ZOCOR) 40 MG tablet Take 1 tablet (40 mg total) by mouth every evening.    tirzepatide (MOUNJARO) 15 MG/0.5ML Pen Inject 15 mg into the skin once a week.    zolpidem (AMBIEN) 5 MG tablet Take 0.5-1 tablets (2.5-5 mg total) by mouth at bedtime as needed for sleep.    cyclobenzaprine (FLEXERIL) 10 MG tablet Take 1 tablet (10 mg total) by mouth 3 (three) times daily as needed for muscle spasms. (Patient not taking: Reported on 11/03/2023) 11/03/2023: As needed, last used end of Dec   EPINEPHrine 0.3 mg/0.3 mL IJ SOAJ injection Inject 0.3 mg into the muscle once as needed for anaphylaxis. (Patient not taking: Reported on 11/03/2023)    ondansetron (ZOFRAN) 4 MG tablet Take 1 tablet (4 mg total) by mouth every 8 (eight) hours as needed for nausea or vomiting. (Patient not taking: Reported on 11/03/2023) 11/03/2023: As needed   promethazine (PHENERGAN) 25 MG tablet Take 1 tablet (25 mg total) by mouth every 8 (eight) hours as needed for nausea or vomiting. (Patient not taking: Reported on 11/03/2023) 11/03/2023: As needed   traMADol (ULTRAM) 50 MG tablet Take 1 tablet (50 mg total) by mouth every 6 (six) hours as needed for moderate pain (pain score 4-6) or severe pain (pain score 7-10). (Patient not taking: Reported on 11/03/2023)  11/03/2023: As needed, has not used   No facility-administered encounter medications on file as of 11/03/2023.   NOT taking zolpidem prior to today's visit  Allergies  Allergen Reactions   Bee Venom Anaphylaxis   Shellfish Allergy Anaphylaxis   Invokana [Canagliflozin] Itching   Sulfamethoxazole-Trimethoprim Other (See Comments)   Penicillins Hives    ROS:  denies depression, anxiety. Some worries about cost, parents, but overall doing well.  +insomnia, with resultant fatigue, trouble concentrating, etc. No other complaints.    Observations/Objective:  BP 100/70 Comment: yesterday at surgery center  Ht 5\' 3"  (1.6 m)   Wt 221 lb (100.2 kg)   LMP 10/18/1982   BMI 39.15 kg/m   Well-appearing, pleasant female, in no distress. Slightly frustrated, but not irritable. Normal affect, grooming. Normal eye contact, speech. Exam is limited  due to the virtual nature of the visit.    Assessment and Plan:  Insomnia, unspecified type - Plan: zolpidem (AMBIEN) 5 MG tablet  Ambien 5mg , start at 1/2 given how sensitive she is to medications. Increase to full tablet if ineffective. Risks/SE reviewed. To let us know if ineffective. Has appt with oncologist in 2 weeks--encouraged her to talk to them about work/schedule/stress. ?need to cut back her hours or take FMLA.  [facility] for rx today, since at home   Follow Up Instructions:    I discussed the assessment and treatment plan with the patient. The patient was provided an opportunity to ask questions and all were answered. The patient agreed with the plan and demonstrated an understanding of the instructions.   The patient was advised to call back or seek an in-person evaluation if the symptoms worsen or if the condition fails to improve as anticipated.  I spent 33 minutes dedicated to the care of this patient, including pre-visit review of records, face to face time, post-visit ordering of testing and  documentation.    Lavonda Jumbo, MD

## 2023-11-11 DIAGNOSIS — H5203 Hypermetropia, bilateral: Secondary | ICD-10-CM | POA: Diagnosis not present

## 2023-11-11 DIAGNOSIS — H52203 Unspecified astigmatism, bilateral: Secondary | ICD-10-CM | POA: Diagnosis not present

## 2023-11-11 LAB — HM DIABETES EYE EXAM

## 2023-11-18 ENCOUNTER — Inpatient Hospital Stay: Payer: Commercial Managed Care - PPO | Attending: Hematology and Oncology | Admitting: Hematology and Oncology

## 2023-11-18 ENCOUNTER — Encounter: Payer: Self-pay | Admitting: *Deleted

## 2023-11-18 ENCOUNTER — Other Ambulatory Visit (HOSPITAL_COMMUNITY): Payer: Self-pay

## 2023-11-18 ENCOUNTER — Telehealth: Payer: Self-pay | Admitting: Genetic Counselor

## 2023-11-18 ENCOUNTER — Inpatient Hospital Stay: Payer: Commercial Managed Care - PPO

## 2023-11-18 VITALS — BP 135/70 | HR 82 | Temp 97.4°F | Resp 16 | Ht 63.39 in | Wt 222.2 lb

## 2023-11-18 DIAGNOSIS — Z801 Family history of malignant neoplasm of trachea, bronchus and lung: Secondary | ICD-10-CM | POA: Insufficient documentation

## 2023-11-18 DIAGNOSIS — Z90710 Acquired absence of both cervix and uterus: Secondary | ICD-10-CM | POA: Insufficient documentation

## 2023-11-18 DIAGNOSIS — D0512 Intraductal carcinoma in situ of left breast: Secondary | ICD-10-CM | POA: Insufficient documentation

## 2023-11-18 DIAGNOSIS — Z90722 Acquired absence of ovaries, bilateral: Secondary | ICD-10-CM | POA: Insufficient documentation

## 2023-11-18 DIAGNOSIS — Z803 Family history of malignant neoplasm of breast: Secondary | ICD-10-CM | POA: Diagnosis not present

## 2023-11-18 DIAGNOSIS — E119 Type 2 diabetes mellitus without complications: Secondary | ICD-10-CM | POA: Insufficient documentation

## 2023-11-18 DIAGNOSIS — M8580 Other specified disorders of bone density and structure, unspecified site: Secondary | ICD-10-CM | POA: Insufficient documentation

## 2023-11-18 MED ORDER — TAMOXIFEN CITRATE 20 MG PO TABS
20.0000 mg | ORAL_TABLET | Freq: Every day | ORAL | 3 refills | Status: DC
  Filled 2023-11-18: qty 90, 90d supply, fill #0
  Filled 2024-02-14: qty 90, 90d supply, fill #1
  Filled 2024-05-12: qty 90, 90d supply, fill #2
  Filled 2024-08-08: qty 90, 90d supply, fill #3

## 2023-11-18 NOTE — Assessment & Plan Note (Addendum)
This is a very pleasant 68 year old postmenopausal female patient with past medical history significant for atypical ductal hyperplasia for which she took tamoxifen for 5 years, stopped around 2019 now diagnosed with DCIS and is here for follow-up.  Ductal Carcinoma In Situ (DCIS) Estrogen receptor positive, progesterone receptor negative, noninvasive breast cancer located in the upper outer quadrant of the left breast. Lumpectomy performed on 11/02/2023. Patient is scheduled for radiation therapy with Dr. Mitzi Hansen. Discussed the role of radiation therapy in reducing local recurrence and the role of systemic therapy in reducing the risk of cancer in both breasts.  We have reviewed about options for antiestrogen therapy including tamoxifen versus aromatase inhibitors, mechanism of action of both classes, adverse effects with both classes of drugs.  She prefers to continue tamoxifen since she had good experience with her and she is also worried about worsening bone density.  She understands the risk of DVT/PE.  She had total hysterectomy hence no concerns about endometrial complications with tamoxifen. -Start Tamoxifen 20mg  daily 2-4 weeks after completion of radiation therapy (around early April 2025). -Check liver profile and lipid profile annually.  Post-Lumpectomy Seroma Small seroma noted in the left breast post lumpectomy. No signs of infection. -Advise wearing a compression bra for a few days to help with resorption.  Genetic Testing Patient has a personal history of DCIS and a family history of breast cancer in two first-degree relatives. Scheduled for genetic testing. She does not want to proceed with genetic testing at this time.  Surveillance -Schedule diagnostic mammogram one year from diagnosis. -Schedule follow-up visit in 6 months.  Weight Management Patient has lost 46 pounds through diet, exercise, and use of Mounjaro. -Continue current weight management  strategies.  Osteopenia Patient has a history of osteopenia and a family history of osteoporosis. -Monitor bone density every two years.

## 2023-11-18 NOTE — Progress Notes (Signed)
[facility] CONSULT NOTE  Patient Care Team: Joselyn Arrow, MD as PCP - General (Family Medicine) Chilton Si, MD as PCP - Cardiology (Cardiology) Himmelrich, Loree Fee, RD (Inactive) as Dietitian (Bariatrics) Donnelly Angelica, RN as Oncology Nurse Navigator Lu Duffel, Margretta Ditty, RN as Oncology Nurse Navigator Rachel Moulds, MD as Consulting Physician (Hematology and Oncology) Abigail Miyamoto, MD as Consulting Physician (General Surgery) Dorothy Puffer, MD as Consulting Physician (Radiation Oncology)  CHIEF COMPLAINTS/PURPOSE OF CONSULTATION:  Newly diagnosed breast cancer  HISTORY OF PRESENTING ILLNESS:  Cindy Carey 68 y.o. female is here because of recent diagnosis of left breast DCIS  Discussed the use of AI scribe software for clinical note transcription with the patient, who gave verbal consent to proceed.  History of Present Illness    The patient is a 68 year old female with noninvasive breast cancer who presents for follow-up after lumpectomy.  She underwent a lumpectomy on November 02, 2023, for ductal carcinoma in situ (DCIS) located in the left breast's upper outer quadrant. The cancer was estrogen receptor-positive and progesterone receptor-negative. She is scheduled for radiation therapy.  She has a history of taking tamoxifen for five years, completed in 2021, for ADH. She is currently considering whether to resume tamoxifen or try another medication due to concerns about bone density, as she has osteopenia.  She is actively managing her weight, having lost 46 pounds through a weight loss clinic, diet, exercise, and taking Mounjaro. She is concerned about potential weight gain with cancer medications.  Her past medical history includes type 2 diabetes and arthritis. She underwent a total abdominal hysterectomy at age 33 due to severe endometriosis.  Her family history includes breast cancer in two maternal first cousins, both diagnosed at stage two. One  cousin experienced significant complications from chemotherapy, including congestive heart failure.  I reviewed her records extensively and collaborated the history with the patient.  SUMMARY OF ONCOLOGIC HISTORY: Oncology History  Ductal carcinoma in situ (DCIS) of left breast  10/05/2023 Initial Diagnosis   Ductal carcinoma in situ (DCIS) of left breast   11/18/2023 Cancer Staging   Staging form: Breast, AJCC 8th Edition - Clinical stage from 11/18/2023: Stage 0 (cTis (DCIS), cN0, cM0, G2, PR-) - Signed by Rachel Moulds, MD on 11/18/2023 Stage prefix: Initial diagnosis Histologic grading system: 3 grade system     MEDICAL HISTORY:  Past Medical History:  Diagnosis Date   Anemia    Asthma    Atypical ductal hyperplasia of left breast    Back pain    Blood transfusion without reported diagnosis 1983   CAD (coronary artery disease)    Diabetes mellitus without complication (HCC) 2010   T2DM   Dyspareunia    Exercise-induced asthma    worse in winter   Food allergy    H/O bone density study 2013   H/O cold sores    H/O colonoscopy 2009   History of endometriosis    Hyperlipidemia    Hypertension    Injury of tendon of rotator cuff 2015   gym injury (right)   Joint pain    Knee pain 08/2015   L>R   Migraine with typical aura    Mild CAD 04/04/2018   Mild disease on coronary CT-A 01/2018.   Morbid obesity (HCC) 09/04/2012   BMI 49.4 kg/m^2    Osteoarthritis    Plantar fasciitis, left 2019   PONV (postoperative nausea and vomiting)    Trigger finger of left thumb 08/2018  injected by Dr. Merlyn Lot 09/18/18    SURGICAL HISTORY: Past Surgical History:  Procedure Laterality Date   BREAST BIOPSY  2010   BREAST BIOPSY Left 09/28/2023   [name] LT BREAST BX W LOC DEV 1ST LESION IMG BX SPEC US GUIDE 09/28/2023 [facility]   BREAST BIOPSY  11/01/2023   MM LT RADIOACTIVE SEED LOC MAMMO GUIDE 11/01/2023 [facility]   BREAST LUMPECTOMY  2010   Benign   BREAST  LUMPECTOMY WITH RADIOACTIVE SEED LOCALIZATION Left 07/23/2015   Procedure: LEFT BREAST SEED GUIDED EXCISION;  Surgeon: Emelia Loron, MD;  Location: [facility];  Service: General;  Laterality: Left;   BREAST LUMPECTOMY WITH RADIOACTIVE SEED LOCALIZATION Left 11/02/2023   Procedure: RADIOACTIVE SEED GUIDED LEFT BREAST LUMPECTOMY;  Surgeon: Abigail Miyamoto, MD;  Location: [facility];  Service: General;  Laterality: Left;  LMA   BREATH TEK H PYLORI  08/28/2012   Procedure: BREATH TEK H PYLORI;  Surgeon: Valarie Merino, MD;  Location: [facility];  Service: General;  Laterality: N/A;   CHOLECYSTECTOMY  2007   EXPLORATORY LAPAROTOMY  1983   endometriosis   GANGLION CYST EXCISION Left 1999   Foot   TOTAL ABDOMINAL HYSTERECTOMY W/ BILATERAL SALPINGOOPHORECTOMY  1984   endometriosis (and appendectomy)    SOCIAL HISTORY: Social History   Socioeconomic History   Marital status: Married    Spouse name: Manasvini Whatley   Number of children: Not on file   Years of education: Not on file   Highest education level: Not on file  Occupational History   Occupation: Nurse Practitioner    Comment: [employer]  Tobacco Use   Smoking status: Never   Smokeless tobacco: Never  Vaping Use   Vaping status: Never Used  Substance and Sexual Activity   Alcohol use: No   Drug use: No   Sexual activity: Yes    Partners: Male    Birth control/protection: Surgical  Other Topics Concern   Not on file  Social History Narrative   Married, no children. 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      Updated 02/2023   Social Drivers of Health   Financial Resource Strain: Low Risk  (03/16/2023)   Overall Financial Resource Strain (CARDIA)    Difficulty of Paying Living Expenses: Not very hard  Food Insecurity: No Food Insecurity (03/16/2023)    Hunger Vital Sign    Worried About Running Out of Food in the Last Year: Never true    Ran Out of Food in the Last Year: Never true  Transportation Needs: No Transportation Needs (03/16/2023)   PRAPARE - Administrator, Civil Service (Medical): No    Lack of Transportation (Non-Medical): No  Physical Activity: Not on file  Stress: No Stress Concern Present (03/16/2023)   [employer] of Occupational Health - Occupational Stress Questionnaire    Feeling of Stress : Only a little  Social Connections: Socially Integrated (03/16/2023)   Social Connection and Isolation Panel [NHANES]    Frequency of Communication with Friends and Family: Three times a week    Frequency of Social Gatherings with Friends and Family: Twice a week    Attends Religious Services: More than 4 times per year    Active Member of [employer] or Organizations: Yes    Attends Banker Meetings: More than 4  times per year    Marital Status: Married  Catering manager Violence: Not At Risk (03/16/2023)   Humiliation, Afraid, Rape, and Kick questionnaire    Fear of Current or Ex-Partner: No    Emotionally Abused: No    Physically Abused: No    Sexually Abused: No    FAMILY HISTORY: Family History  Problem Relation Age of Onset   Thyroid disease Mother        hypothyroidism   Heart disease Mother 79       CABG at 10   Hypertension Mother    Diverticulitis Mother 80       perforation, required surgery   Diabetes Father    Heart attack Father 19   Heart disease Father    Hypertension Father    Peripheral Artery Disease Father        stents at 61, another at 87   Pneumonia Sister    Osteoporosis Maternal Grandmother    Heart failure Maternal Grandmother    Lung cancer Maternal Grandfather    Dementia Paternal Grandmother    Melanoma Paternal Grandmother        melanoma on her foot (not related to death)   Diabetes Paternal Grandfather    Heart disease Paternal Grandfather    Lung  cancer Maternal Aunt    Heart failure Maternal Uncle    Lung cancer Maternal Uncle    Congestive Heart Failure Maternal Uncle    Breast cancer Paternal Aunt        49's   Breast cancer Cousin        mets to lung   Breast cancer Cousin    Congestive Heart Failure Cousin 48   Heart failure Cousin     ALLERGIES:  is allergic to bee venom, shellfish allergy, invokana [canagliflozin], sulfamethoxazole-trimethoprim, and penicillins.  MEDICATIONS:  Current Outpatient Medications  Medication Sig Dispense Refill   tamoxifen (NOLVADEX) 20 MG tablet Take 1 tablet (20 mg total) by mouth daily. 90 tablet 3   acetaminophen (TYLENOL) 500 MG tablet Take 500 mg by mouth every 6 (six) hours as needed.     aspirin EC 81 MG tablet Take 81 mg by mouth daily.     Blood Glucose Monitoring Suppl (BLOOD GLUCOSE MONITOR SYSTEM) w/Device KIT Use to check blood sugar as needed as directed 1 kit 0   Calcium Carbonate-Vit D-Min (CALCIUM 600+D PLUS MINERALS) 600-400 MG-UNIT TABS Take 1 tablet by mouth 2 (two) times daily.     Cholecalciferol (VITAMIN D3) 125 MCG (5000 UT) CAPS Take 1 capsule by mouth daily.     cyclobenzaprine (FLEXERIL) 10 MG tablet Take 1 tablet (10 mg total) by mouth 3 (three) times daily as needed for muscle spasms. (Patient not taking: Reported on 11/03/2023) 30 tablet 0   empagliflozin (JARDIANCE) 25 MG TABS tablet Take 1 tablet (25 mg total) by mouth daily with breakfast. 90 tablet 2   EPINEPHrine 0.3 mg/0.3 mL IJ SOAJ injection Inject 0.3 mg into the muscle once as needed for anaphylaxis. (Patient not taking: Reported on 11/03/2023) 2 each 0   famotidine (PEPCID) 20 MG tablet Take 1 tablet (20 mg total) by mouth 2 (two) times daily. 60 tablet 0   glucose blood test strip Test  2 (two) times daily. 100 each 2   Lancets (FREESTYLE) lancets 1 each by Other route 2 (two) times daily. Test twice daily Dx:E11.9 100 each 2   lisinopril (ZESTRIL) 5 MG tablet Take 1 tablet (5 mg total) by mouth daily.  30 tablet 3   Menatetrenone (VITAMIN K2) 100 MCG TABS      metFORMIN (GLUCOPHAGE-XR) 500 MG 24 hr tablet Take 2 tablets (1,000 mg total) by mouth 2 (two) times daily. 360 tablet 1   NONFORMULARY OR COMPOUNDED ITEM Place 2,500 mcg under the tongue daily.     ondansetron (ZOFRAN) 4 MG tablet Take 1 tablet (4 mg total) by mouth every 8 (eight) hours as needed for nausea or vomiting. (Patient not taking: Reported on 11/03/2023) 20 tablet 0   promethazine (PHENERGAN) 25 MG tablet Take 1 tablet (25 mg total) by mouth every 8 (eight) hours as needed for nausea or vomiting. (Patient not taking: Reported on 11/03/2023) 30 tablet 0   simvastatin (ZOCOR) 40 MG tablet Take 1 tablet (40 mg total) by mouth every evening. 90 tablet 3   tirzepatide (MOUNJARO) 15 MG/0.5ML Pen Inject 15 mg into the skin once a week. 6 mL 1   zolpidem (AMBIEN) 5 MG tablet Take 0.5-1 tablets (2.5-5 mg total) by mouth at bedtime as needed for sleep. 30 tablet 0   No current facility-administered medications for this visit.    All other systems were reviewed with the patient and are negative.  PHYSICAL EXAMINATION: ECOG PERFORMANCE STATUS: 0 - Asymptomatic  Vitals:   11/18/23 1025  BP: 135/70  Pulse: 82  Resp: 16  Temp: (!) 97.4 F (36.3 C)  SpO2: 99%   Filed Weights   11/18/23 1025  Weight: 222 lb 3.2 oz (100.8 kg)    GENERAL:alert, no distress and comfortable Left breast healing well.  There appears to be some erythema at the edge of the incision but no overt evidence of infection.  Small postop seroma noted.  LABORATORY DATA:  I have reviewed the data as listed Lab Results  Component Value Date   WBC 5.9 07/21/2023   HGB 12.2 07/21/2023   HCT 40.1 07/21/2023   MCV 89 07/21/2023   PLT 106 (L) 07/21/2023   Lab Results  Component Value Date   NA 137 10/26/2023   K 4.5 10/26/2023   CL 107 10/26/2023   CO2 21 (L) 10/26/2023    RADIOGRAPHIC STUDIES: I have personally reviewed the radiological reports and  agreed with the findings in the report.  ASSESSMENT AND PLAN:  Ductal carcinoma in situ (DCIS) of left breast This is a very pleasant 68 year old postmenopausal female patient with past medical history significant for atypical ductal hyperplasia for which she took tamoxifen for 5 years, stopped around 2019 now diagnosed with DCIS and is here for follow-up.  Ductal Carcinoma In Situ (DCIS) Estrogen receptor positive, progesterone receptor negative, noninvasive breast cancer located in the upper outer quadrant of the left breast. Lumpectomy performed on 11/02/2023. Patient is scheduled for radiation therapy with Dr. Mitzi Hansen. Discussed the role of radiation therapy in reducing local recurrence and the role of systemic therapy in reducing the risk of cancer in both breasts.  We have reviewed about options for antiestrogen therapy including tamoxifen versus aromatase inhibitors, mechanism of action of both classes, adverse effects with both classes of drugs.  She prefers to continue tamoxifen since she had good experience with her and she is also worried about worsening bone density.  She understands the risk of DVT/PE.  She had total hysterectomy hence no concerns about endometrial complications with tamoxifen. -Start Tamoxifen 20mg  daily 2-4 weeks after completion of radiation therapy (around early April 2025). -Check liver profile and lipid profile annually.  Post-Lumpectomy Seroma Small seroma noted in the left  breast post lumpectomy. No signs of infection. -Advise wearing a compression bra for a few days to help with resorption.  Genetic Testing Patient has a personal history of DCIS and a family history of breast cancer in two first-degree relatives. Scheduled for genetic testing. She does not want to proceed with genetic testing at this time.  Surveillance -Schedule diagnostic mammogram one year from diagnosis. -Schedule follow-up visit in 6 months.  Weight Management Patient has lost 46  pounds through diet, exercise, and use of Mounjaro. -Continue current weight management strategies.  Osteopenia Patient has a history of osteopenia and a family history of osteoporosis. -Monitor bone density every two years.   All questions were answered. The patient knows to call the clinic with any problems, questions or concerns.    Rachel Moulds, MD 11/18/23

## 2023-11-18 NOTE — Telephone Encounter (Signed)
Per Dr. Al Pimple, patient is not interested in genetics at this time.  Genetic counseling appt cancelled.

## 2023-11-20 ENCOUNTER — Encounter: Payer: Self-pay | Admitting: Radiation Oncology

## 2023-11-22 ENCOUNTER — Other Ambulatory Visit: Payer: Self-pay

## 2023-11-22 ENCOUNTER — Encounter: Payer: Self-pay | Admitting: Licensed Clinical Social Worker

## 2023-11-22 NOTE — Progress Notes (Signed)
[facility] Clinical Social Work  Clinical Social Work was referred by new patient protocol for assessment of psychosocial needs.  Clinical Social Worker contacted patient by phone to offer support and assess for needs.   CSW introduced self and support services. 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 Pt reports to be doing well overall. Biggest concern was not getting clear answers from [place] about authorizations and coverage which Hansel Starling C helped address.  CSW provided direct contact information and encouraged pt to call with any support needs.     Dujuan Stankowski E Nerea Bordenave, LCSW  Clinical Social Worker [facility]

## 2023-11-25 NOTE — Progress Notes (Signed)
   PROVIDER:  VICENTA DASIE POLI, MD  MRN: [id number] DOB: 1956-09-03 DATE OF ENCOUNTER: 11/25/2023 Interval History:     She is here for postoperative visit status post radioactive seed guided left breast lumpectomy for DCIS.  She reports she is felt a seroma at the area of the lumpectomy but has been massaging it and it has decreased in size.  She has already seen medical oncology postoperatively and will be starting radiation therapy soon    Physical Examination:   Physical Exam   She appears well on exam  There is a seroma underneath the incision.  It is moderate in size.  We discussed aspiration of this but we decided to hold as it is decreasing in size with massaging.  There is no evidence of infection in the left breast postoperatively  Again, the final pathology showed the DCIS measures 1.2 cm.  Margins were negative.   Assessment and Plan:     Cindy Carey is a 68 y.o. female who underwent radioactive seed guided left breast lumpectomy on 11/02/2023.  Diagnoses and all orders for this visit:  Postop check     She will now be starting radiation therapy.  Should she have any issues regarding her breast incision or the seroma, she will call and I will work her in as soon as possible.  If not, I will see her back in approximate 3 months.  From my standpoint she may resume normal activity.    Return in about 3 months (around 02/22/2024).   The plan was discussed in detail with the patient today, who expressed understanding.  The patient has my contact information, and understands to call me with any additional questions or concerns in the interval.  I would be happy to see the patient back sooner if the need arises.   VICENTA DASIE POLI, MD

## 2023-11-29 NOTE — Progress Notes (Incomplete)
Nursing interview for Ductal carcinoma in situ (DCIS) of left breast Stage 0 (cTis (DCIS), cN0, cM0, G2, PR-).   Patient identity verified x2.  Patient reports mild numbness to LT breast, and incision line healing well. Patient denies chest/arm pain, sob and any other related issues at this time.  Meaningful use complete.  Vitals- BP 128/74 (BP Location: Right Arm, Patient Position: Sitting, Cuff Size: Normal)   Pulse 83   Temp 98.7 F (37.1 C) (Oral)   Resp 19   Ht 5\' 3"  (1.6 m)   Wt 220 lb (99.8 kg)   LMP 10/18/1982   SpO2 99%   BMI 38.97 kg/m   This concludes the interaction.  Ruel Favors, LPN

## 2023-11-29 NOTE — Progress Notes (Signed)
Radiation Oncology         [phone] ________________________________  Name: Cindy Carey        MRN: [id number]  Date of Service: 11/30/2023 DOB: 08-17-1956  EV:OJJKK, Eve, MD  Abigail Miyamoto, MD     REFERRING PHYSICIAN: Abigail Miyamoto, MD   DIAGNOSIS: The encounter diagnosis was Ductal carcinoma in situ (DCIS) of left breast.   HISTORY OF PRESENT ILLNESS: Cindy Carey is a 68 y.o. female with a diagnosis of left breast cancer. The patient was noted to have screening detected mass in the right breast and had a benign papilloma excised in 2016. She returned for diagnostic workup on 09/27/2023 and by ultrasound the mass noted on mammogram was located and measured 6 mm in the 2 o'clock position.  Her axilla was negative for adenopathy.  Biopsy on 09/28/2023 showed intermediate grade DCIS with solid papillary features, the tumor was ER positive, PR negative.   Since her last visit, she underwent a left lumpectomy on 11/02/23 and final pathology showed an intermediate grade DCIS measuring 1.2 cm with clear margins, the closest being 5 mm from the posterior margin. An additional inferior margin showed a fibroadenoma, and margins overall negative for malignancy. She's seen today to discuss adjuvant radiation.     PREVIOUS RADIATION THERAPY: No   PAST MEDICAL HISTORY:  Past Medical History:  Diagnosis Date   Anemia    Asthma    Atypical ductal hyperplasia of left breast    Back pain    Blood transfusion without reported diagnosis 1983   CAD (coronary artery disease)    Diabetes mellitus without complication (HCC) 2010   T2DM   Dyspareunia    Exercise-induced asthma    worse in winter   Food allergy    H/O bone density study 2013   H/O cold sores    H/O colonoscopy 2009   History of endometriosis    Hyperlipidemia    Hypertension    Injury of tendon of rotator cuff 2015   gym injury (right)   Joint pain    Knee pain 08/2015   L>R   Migraine with typical  aura    Mild CAD 04/04/2018   Mild disease on coronary CT-A 01/2018.   Morbid obesity (HCC) 09/04/2012   BMI 49.4 kg/m^2    Osteoarthritis    Plantar fasciitis, left 2019   PONV (postoperative nausea and vomiting)    Trigger finger of left thumb 08/2018   injected by Dr. Merlyn Lot 09/18/18       PAST SURGICAL HISTORY: Past Surgical History:  Procedure Laterality Date   BREAST BIOPSY  2010   BREAST BIOPSY Left 09/28/2023   [name] LT BREAST BX W LOC DEV 1ST LESION IMG BX SPEC US GUIDE 09/28/2023 [facility]   BREAST BIOPSY  11/01/2023   MM LT RADIOACTIVE SEED LOC MAMMO GUIDE 11/01/2023 [facility]   BREAST LUMPECTOMY  2010   Benign   BREAST LUMPECTOMY WITH RADIOACTIVE SEED LOCALIZATION Left 07/23/2015   Procedure: LEFT BREAST SEED GUIDED EXCISION;  Surgeon: Emelia Loron, MD;  Location: [facility];  Service: General;  Laterality: Left;   BREAST LUMPECTOMY WITH RADIOACTIVE SEED LOCALIZATION Left 11/02/2023   Procedure: RADIOACTIVE SEED GUIDED LEFT BREAST LUMPECTOMY;  Surgeon: Abigail Miyamoto, MD;  Location: [facility];  Service: General;  Laterality: Left;  LMA   BREATH TEK H PYLORI  08/28/2012   Procedure: BREATH TEK H PYLORI;  Surgeon: Valarie Merino, MD;  Location: [facility];  Service: General;  Laterality: N/A;   CHOLECYSTECTOMY  2007   EXPLORATORY LAPAROTOMY  1983   endometriosis   GANGLION CYST EXCISION Left 1999   Foot   TOTAL ABDOMINAL HYSTERECTOMY W/ BILATERAL SALPINGOOPHORECTOMY  1984   endometriosis (and appendectomy)     FAMILY HISTORY:  Family History  Problem Relation Age of Onset   Thyroid disease Mother        hypothyroidism   Heart disease Mother 45       CABG at 48   Hypertension Mother    Diverticulitis Mother 69       perforation, required surgery   Diabetes Father    Heart attack Father 74   Heart disease Father    Hypertension Father    Peripheral Artery Disease Father        stents at 84, another at 35    Pneumonia Sister    Osteoporosis Maternal Grandmother    Heart failure Maternal Grandmother    Lung cancer Maternal Grandfather    Dementia Paternal Grandmother    Melanoma Paternal Grandmother        melanoma on her foot (not related to death)   Diabetes Paternal Grandfather    Heart disease Paternal Grandfather    Lung cancer Maternal Aunt    Heart failure Maternal Uncle    Lung cancer Maternal Uncle    Congestive Heart Failure Maternal Uncle    Breast cancer Paternal Aunt        75's   Breast cancer Cousin        mets to lung   Breast cancer Cousin    Congestive Heart Failure Cousin 48   Heart failure Cousin      SOCIAL HISTORY:  reports that she has never smoked. She has never used smokeless tobacco. She reports that she does not drink alcohol and does not use drugs.  The patient is married and lives in [place].  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    ALLERGIES: Bee venom, Shellfish allergy, Invokana [canagliflozin], Sulfamethoxazole-trimethoprim, and Penicillins   MEDICATIONS:  Current Outpatient Medications  Medication Sig Dispense Refill   acetaminophen (TYLENOL) 500 MG tablet Take 500 mg by mouth every 6 (six) hours as needed.     aspirin EC 81 MG tablet Take 81 mg by mouth daily.     Blood Glucose Monitoring Suppl (BLOOD GLUCOSE MONITOR SYSTEM) w/Device KIT Use to check blood sugar as needed as directed 1 kit 0   Calcium Carbonate-Vit D-Min (CALCIUM 600+D PLUS MINERALS) 600-400 MG-UNIT TABS Take 1 tablet by mouth 2 (two) times daily.     Cholecalciferol (VITAMIN D3) 125 MCG (5000 UT) CAPS Take 1 capsule by mouth daily.     cyclobenzaprine (FLEXERIL) 10 MG tablet Take 1 tablet (10 mg total) by mouth 3 (three) times daily as needed for muscle spasms. (Patient not taking: Reported on 11/03/2023) 30 tablet 0   empagliflozin (JARDIANCE) 25 MG TABS tablet Take 1 tablet (25 mg total) by mouth daily with breakfast. 90 tablet 2    EPINEPHrine 0.3 mg/0.3 mL IJ SOAJ injection Inject 0.3 mg into the muscle once as needed for anaphylaxis. (Patient not taking: Reported on 11/03/2023) 2 each 0   famotidine (PEPCID) 20 MG tablet Take 1 tablet (20 mg total) by mouth 2 (two) times daily. 60 tablet 0   glucose blood test strip Test  2 (two) times daily. 100 each 2   Lancets (FREESTYLE) lancets 1 each by Other  route 2 (two) times daily. Test twice daily Dx:E11.9 100 each 2   lisinopril (ZESTRIL) 5 MG tablet Take 1 tablet (5 mg total) by mouth daily. 30 tablet 3   Menatetrenone (VITAMIN K2) 100 MCG TABS      metFORMIN (GLUCOPHAGE-XR) 500 MG 24 hr tablet Take 2 tablets (1,000 mg total) by mouth 2 (two) times daily. 360 tablet 1   NONFORMULARY OR COMPOUNDED ITEM Place 2,500 mcg under the tongue daily.     ondansetron (ZOFRAN) 4 MG tablet Take 1 tablet (4 mg total) by mouth every 8 (eight) hours as needed for nausea or vomiting. (Patient not taking: Reported on 11/03/2023) 20 tablet 0   promethazine (PHENERGAN) 25 MG tablet Take 1 tablet (25 mg total) by mouth every 8 (eight) hours as needed for nausea or vomiting. (Patient not taking: Reported on 11/03/2023) 30 tablet 0   simvastatin (ZOCOR) 40 MG tablet Take 1 tablet (40 mg total) by mouth every evening. 90 tablet 3   tamoxifen (NOLVADEX) 20 MG tablet Take 1 tablet (20 mg total) by mouth daily. 90 tablet 3   tirzepatide (MOUNJARO) 15 MG/0.5ML Pen Inject 15 mg into the skin once a week. 6 mL 1   zolpidem (AMBIEN) 5 MG tablet Take 0.5-1 tablets (2.5-5 mg total) by mouth at bedtime as needed for sleep. 30 tablet 0   No current facility-administered medications for this visit.     REVIEW OF SYSTEMS: On review of systems, the patient reports that she is doing well. She had a breast seroma that has improved since massaging the area and wearing her compression bra. She has some discomfort with pressure over her breast as in having a purse strap press on the area or a seatbelt. No other complaints  are verbalized.     PHYSICAL EXAM:  Wt Readings from Last 3 Encounters:  11/18/23 222 lb 3.2 oz (100.8 kg)  11/03/23 221 lb (100.2 kg)  11/02/23 225 lb 8.5 oz (102.3 kg)   Temp Readings from Last 3 Encounters:  11/18/23 (!) 97.4 F (36.3 C) (Temporal)  11/02/23 (!) 97.3 F (36.3 C) (Temporal)  09/27/23 97.9 F (36.6 C)   BP Readings from Last 3 Encounters:  11/18/23 135/70  11/03/23 100/70  11/02/23 131/70   Pulse Readings from Last 3 Encounters:  11/18/23 82  11/02/23 64  09/27/23 75   In general this is a well appearing caucasian female in no acute distress. She's alert and oriented x4 and appropriate throughout the examination. Cardiopulmonary assessment is negative for acute distress and she exhibits normal effort. The left breast lumpectomy site is well healed without erythema, separation, or drainage. The surgical cavity is palpable but no fluctuance is appreciated.     ECOG = 1  0 - Asymptomatic (Fully active, able to carry on all predisease activities without restriction)  1 - Symptomatic but completely ambulatory (Restricted in physically strenuous activity but ambulatory and able to carry out work of a light or sedentary nature. For example, light housework, office work)  2 - Symptomatic, <50% in bed during the day (Ambulatory and capable of all self care but unable to carry out any work activities. Up and about more than 50% of waking hours)  3 - Symptomatic, >50% in bed, but not bedbound (Capable of only limited self-care, confined to bed or chair 50% or more of waking hours)  4 - Bedbound (Completely disabled. Cannot carry on any self-care. Totally confined to bed or chair)  5 - Death  Oken MM, Creech RH, Tormey DC, et al. [phone]). "Toxicity and response criteria of the [facility]". Am. Evlyn Clines. Oncol. 5 (6): 649-55    LABORATORY DATA:  Lab Results  Component Value Date   WBC 5.9 07/21/2023   HGB 12.2 07/21/2023   HCT 40.1  07/21/2023   MCV 89 07/21/2023   PLT 106 (L) 07/21/2023   Lab Results  Component Value Date   NA 137 10/26/2023   K 4.5 10/26/2023   CL 107 10/26/2023   CO2 21 (L) 10/26/2023   Lab Results  Component Value Date   ALT 18 07/21/2023   AST 26 07/21/2023   ALKPHOS 97 07/21/2023   BILITOT 0.6 07/21/2023      RADIOGRAPHY: MM Breast Surgical Specimen Result Date: 11/02/2023 CLINICAL DATA:  Evaluate surgical specimen following lumpectomy for LEFT breast cancer. EXAM: SPECIMEN RADIOGRAPH OF THE LEFT BREAST COMPARISON:  Previous exam(s). FINDINGS: Status post excision of the LEFT breast. The radioactive seed and biopsy marker clip are present and intact IMPRESSION: Specimen radiograph of the LEFT breast. Electronically Signed   By: Harmon Pier M.D.   On: 11/02/2023 09:07   MM LT RADIOACTIVE SEED LOC MAMMO GUIDE Result Date: 11/01/2023 CLINICAL DATA:  68 year old female presents for radioactive seed localization of LEFT breast cancer prior to lumpectomy. EXAM: MAMMOGRAPHIC GUIDED RADIOACTIVE SEED LOCALIZATION OF THE LEFT BREAST COMPARISON:  Previous exam(s). FINDINGS: Patient presents for radioactive seed localization prior to LEFT lumpectomy. I met with the patient and we discussed the procedure of seed localization including benefits and alternatives. We discussed the high likelihood of a successful procedure. We discussed the risks of the procedure including infection, bleeding, tissue injury and further surgery. We discussed the low dose of radioactivity involved in the procedure. Informed, written consent was given. The usual time-out protocol was performed immediately prior to the procedure. On preprocedure imaging, it was noted that the biopsy clip had actually migrated 1.2 cm LATERAL to the biopsied mass/malignancy. Using mammographic guidance, sterile technique, 1% lidocaine and an I-125 radioactive seed, the biopsied mass/malignancy was localized using a LATERAL approach. The follow-up  mammogram images confirm the seed in the expected location and were marked for Dr. Magnus Ivan. Follow-up survey of the patient confirms presence of the radioactive seed. Order number of I-125 seed:  [id number]. Total activity:  0.250 millicuries.  Reference Date: 09/28/2023. The patient tolerated the procedure well and was [facility]. She was given instructions regarding seed removal. IMPRESSION: Radioactive seed localization LEFT breast. Please note that the biopsy clip was noted to have migrated 1.2 cm LATERAL to the biopsied mass/malignancy. The radioactive seed is located at the biopsied mass/malignancy. No apparent complications. Electronically Signed   By: Harmon Pier M.D.   On: 11/01/2023 13:33       IMPRESSION/PLAN: 1. Intermediate Grade, ER positive DCIS of the left breast. Dr. Mitzi Hansen has reviewed her final pathology and today we discussed her course and pathology as well as the nature of early stage breast disease. She has done well, recovering from her surgery. Dr. Mitzi Hansen recommends external radiotherapy to the breast  to reduce risks of local recurrence. We discussed the risks, benefits, short, and long term effects of radiotherapy, as well as the curative intent, and the patient is interested in proceeding. I reviewed the delivery and logistics of radiotherapy and Dr. Mitzi Hansen recommends 4 weeks of radiotherapy to the left breast with deep inspiration breath-hold technique. Written consent is obtained and placed in the chart,  a copy was provided to the patient. She will simulate today.      In a visit lasting 45 minutes, greater than 50% of the time was spent face to face discussing the patient's condition, in preparation for the discussion, and coordinating the patient's care.       Osker Mason, [facility]    **Disclaimer: This note was dictated with voice recognition software. Similar sounding words can inadvertently be transcribed and this note may contain transcription  errors which may not have been corrected upon publication of note.**

## 2023-11-30 ENCOUNTER — Ambulatory Visit
Admission: RE | Admit: 2023-11-30 | Discharge: 2023-11-30 | Disposition: A | Discharge status: Home / Self Care | Payer: Commercial Managed Care - PPO | Source: Ambulatory Visit | Attending: Radiation Oncology | Admitting: Radiation Oncology

## 2023-11-30 ENCOUNTER — Encounter: Payer: Self-pay | Admitting: Radiation Oncology

## 2023-11-30 VITALS — BP 128/74 | HR 83 | Temp 98.7°F | Resp 19 | Ht 63.0 in | Wt 220.0 lb

## 2023-11-30 DIAGNOSIS — Z79899 Other long term (current) drug therapy: Secondary | ICD-10-CM | POA: Insufficient documentation

## 2023-11-30 DIAGNOSIS — Z801 Family history of malignant neoplasm of trachea, bronchus and lung: Secondary | ICD-10-CM | POA: Insufficient documentation

## 2023-11-30 DIAGNOSIS — I2510 Atherosclerotic heart disease of native coronary artery without angina pectoris: Secondary | ICD-10-CM | POA: Insufficient documentation

## 2023-11-30 DIAGNOSIS — E785 Hyperlipidemia, unspecified: Secondary | ICD-10-CM | POA: Insufficient documentation

## 2023-11-30 DIAGNOSIS — Z7985 Long-term (current) use of injectable non-insulin antidiabetic drugs: Secondary | ICD-10-CM | POA: Insufficient documentation

## 2023-11-30 DIAGNOSIS — J45990 Exercise induced bronchospasm: Secondary | ICD-10-CM | POA: Insufficient documentation

## 2023-11-30 DIAGNOSIS — D0512 Intraductal carcinoma in situ of left breast: Secondary | ICD-10-CM | POA: Insufficient documentation

## 2023-11-30 DIAGNOSIS — J45909 Unspecified asthma, uncomplicated: Secondary | ICD-10-CM | POA: Insufficient documentation

## 2023-11-30 DIAGNOSIS — Z7982 Long term (current) use of aspirin: Secondary | ICD-10-CM | POA: Insufficient documentation

## 2023-11-30 DIAGNOSIS — E119 Type 2 diabetes mellitus without complications: Secondary | ICD-10-CM | POA: Insufficient documentation

## 2023-11-30 DIAGNOSIS — Z7984 Long term (current) use of oral hypoglycemic drugs: Secondary | ICD-10-CM | POA: Insufficient documentation

## 2023-11-30 DIAGNOSIS — Z170 Estrogen receptor positive status [ER+]: Secondary | ICD-10-CM | POA: Insufficient documentation

## 2023-11-30 DIAGNOSIS — Z510 Encounter for antineoplastic radiation therapy: Secondary | ICD-10-CM | POA: Diagnosis not present

## 2023-11-30 DIAGNOSIS — Z803 Family history of malignant neoplasm of breast: Secondary | ICD-10-CM | POA: Insufficient documentation

## 2023-11-30 DIAGNOSIS — I10 Essential (primary) hypertension: Secondary | ICD-10-CM | POA: Insufficient documentation

## 2023-11-30 DIAGNOSIS — M1990 Unspecified osteoarthritis, unspecified site: Secondary | ICD-10-CM | POA: Insufficient documentation

## 2023-11-30 DIAGNOSIS — D649 Anemia, unspecified: Secondary | ICD-10-CM | POA: Insufficient documentation

## 2023-12-01 ENCOUNTER — Telehealth: Payer: Commercial Managed Care - PPO | Admitting: Family Medicine

## 2023-12-01 ENCOUNTER — Encounter: Payer: Commercial Managed Care - PPO | Admitting: Genetic Counselor

## 2023-12-01 ENCOUNTER — Other Ambulatory Visit: Payer: Commercial Managed Care - PPO

## 2023-12-01 DIAGNOSIS — Z510 Encounter for antineoplastic radiation therapy: Secondary | ICD-10-CM | POA: Diagnosis not present

## 2023-12-01 DIAGNOSIS — D0512 Intraductal carcinoma in situ of left breast: Secondary | ICD-10-CM | POA: Diagnosis not present

## 2023-12-01 DIAGNOSIS — Z170 Estrogen receptor positive status [ER+]: Secondary | ICD-10-CM | POA: Diagnosis not present

## 2023-12-03 ENCOUNTER — Other Ambulatory Visit: Payer: Self-pay | Admitting: Family Medicine

## 2023-12-03 DIAGNOSIS — G4700 Insomnia, unspecified: Secondary | ICD-10-CM

## 2023-12-05 ENCOUNTER — Encounter (INDEPENDENT_AMBULATORY_CARE_PROVIDER_SITE_OTHER): Payer: Self-pay | Admitting: Physician Assistant

## 2023-12-05 ENCOUNTER — Other Ambulatory Visit: Payer: Self-pay

## 2023-12-05 ENCOUNTER — Other Ambulatory Visit (HOSPITAL_COMMUNITY): Payer: Self-pay

## 2023-12-05 ENCOUNTER — Telehealth (INDEPENDENT_AMBULATORY_CARE_PROVIDER_SITE_OTHER): Payer: Commercial Managed Care - PPO | Admitting: Physician Assistant

## 2023-12-05 ENCOUNTER — Ambulatory Visit
Admission: RE | Admit: 2023-12-05 | Discharge: 2023-12-05 | Disposition: A | Discharge status: Home / Self Care | Payer: Commercial Managed Care - PPO | Source: Ambulatory Visit | Attending: Radiation Oncology | Admitting: Radiation Oncology

## 2023-12-05 VITALS — Ht 63.0 in | Wt 221.0 lb

## 2023-12-05 DIAGNOSIS — E119 Type 2 diabetes mellitus without complications: Secondary | ICD-10-CM

## 2023-12-05 DIAGNOSIS — E785 Hyperlipidemia, unspecified: Secondary | ICD-10-CM | POA: Diagnosis not present

## 2023-12-05 DIAGNOSIS — Z7985 Long-term (current) use of injectable non-insulin antidiabetic drugs: Secondary | ICD-10-CM

## 2023-12-05 DIAGNOSIS — E669 Obesity, unspecified: Secondary | ICD-10-CM | POA: Diagnosis not present

## 2023-12-05 DIAGNOSIS — I700 Atherosclerosis of aorta: Secondary | ICD-10-CM | POA: Diagnosis not present

## 2023-12-05 DIAGNOSIS — Z170 Estrogen receptor positive status [ER+]: Secondary | ICD-10-CM | POA: Diagnosis not present

## 2023-12-05 DIAGNOSIS — I10 Essential (primary) hypertension: Secondary | ICD-10-CM | POA: Diagnosis not present

## 2023-12-05 DIAGNOSIS — Z510 Encounter for antineoplastic radiation therapy: Secondary | ICD-10-CM | POA: Diagnosis not present

## 2023-12-05 DIAGNOSIS — E1159 Type 2 diabetes mellitus with other circulatory complications: Secondary | ICD-10-CM

## 2023-12-05 DIAGNOSIS — E1169 Type 2 diabetes mellitus with other specified complication: Secondary | ICD-10-CM

## 2023-12-05 DIAGNOSIS — D0512 Intraductal carcinoma in situ of left breast: Secondary | ICD-10-CM | POA: Diagnosis not present

## 2023-12-05 LAB — RAD ONC ARIA SESSION SUMMARY
Course Elapsed Days: 0
Plan Fractions Treated to Date: 1
Plan Prescribed Dose Per Fraction: 2.66 Gy
Plan Total Fractions Prescribed: 16
Plan Total Prescribed Dose: 42.56 Gy
Reference Point Dosage Given to Date: 2.66 Gy
Reference Point Session Dosage Given: 2.66 Gy
Session Number: 1

## 2023-12-05 MED ORDER — TIRZEPATIDE 15 MG/0.5ML ~~LOC~~ SOAJ
15.0000 mg | SUBCUTANEOUS | 1 refills | Status: DC
  Filled 2023-12-05: qty 6, 84d supply, fill #0
  Filled 2023-12-29 – 2024-01-02 (×2): qty 2, 28d supply, fill #0

## 2023-12-05 MED ORDER — ZOLPIDEM TARTRATE 5 MG PO TABS
5.0000 mg | ORAL_TABLET | Freq: Every evening | ORAL | 1 refills | Status: DC | PRN
  Filled 2023-12-05: qty 30, 30d supply, fill #0
  Filled 2024-01-19: qty 30, 30d supply, fill #1

## 2023-12-05 MED ORDER — LISINOPRIL 5 MG PO TABS
5.0000 mg | ORAL_TABLET | Freq: Every day | ORAL | 3 refills | Status: DC
  Filled 2023-12-05 – 2023-12-21 (×2): qty 30, 30d supply, fill #0
  Filled 2024-01-17 (×2): qty 30, 30d supply, fill #1

## 2023-12-05 NOTE — Telephone Encounter (Signed)
Patient requested. Taking full tablet nightly, 1/2 did not work.

## 2023-12-05 NOTE — Progress Notes (Signed)
TeleHealth Visit:  This visit was completed with telemedicine (audio/video) technology. Cindy Carey has verbally consented to this TeleHealth visit. The patient is located at her office, the provider is located at the [facility] office. The participants in this visit include the listed provider and patient. The visit was conducted today via MyChart video.  OBESITY Cindy Carey is here to discuss her progress with her obesity treatment plan along with follow-up of her obesity related diagnoses.   Today's visit was # 30 Starting weight: 251 lbs Starting date: 05/20/21 Weight at last in office visit: 221 lbs on 09/27/23 Total weight loss: 30 lbs at last in office visit on 09/27/23. Today's reported weight (220 lbs):  220 lbs  Nutrition Plan: the Category 1 plan and the Category 2 plan - 50% adherence.  Current exercise:  limited following breast surgery with upper extremities.   Interim History:  Cindy Carey is a 68 year old female who presents for follow-up of her obesity treatment plan.  She is currently on Mounjaro 15 mg weekly for type 2 diabetes, Jardiance 25 mg once daily, and Metformin 1000 mg twice daily. She checks her blood sugars most mornings and before dinner, with no significant issues noted, except for one instance of a reading of 115 mg/dL, which she considers higher than usual. No episodes of hypoglycemia. She experiences increased gas, which she attributes to either the medication or her current life situation.  She is status post-surgery for ductal carcinoma in situ (DCIS) of the left breast and is about to start radiation therapy. She reports having a seroma post-surgery, which has decreased in size and did not require drainage.  She is on Lisinopril 5 mg daily for hypertension and Zocor 40 mg daily for hyperlipidemia and atherosclerosis of the aortic arch. She mentions needing a refill for Lisinopril and prefers a 90-day supply to be mailed to her. She also mentions purchasing  Famotidine over the counter instead of through prescription.  She reports issues with sleep, which she attributes to stress and frustration from a previous experience in November. She has been prescribed Ambien by her primary care physician, which has helped improve her sleep. She notes that she does not intend to use Ambien long-term but it has been beneficial in the short term.  She continues to work and finds it beneficial to maintain a routine. No significant issues with appetite and denies any significant weight gain during this period. She occasionally engages in comfort eating, such as having two servings of potato soup on a rainy day, but does not consider it a regular habit. Eating all of the food on the plan., Protein intake is as prescribed, Is not drinking sugar sweetened beverages., Is not exceeding snack calorie allotment, Is not skipping meals, Meeting calorie goals., Meeting protein goals., Water intake is adequate., Denies polyphagia, and Denies excessive cravings.   Hunger controlled: moderately controlled. Cravings controlled:  moderately controlled.   Pharmacotherapy: Latroya is on Mounjaro 15 mg SQ weekly and Jardiance 25 mg daily and metformin 1000 mg BID Adverse side effects:    Hunger is moderately controlled.  Cravings are moderately controlled.  Assessment/Plan:  Obesity Follow-up for obesity treatment. She is on Mounjaro 15 mg weekly with no significant weight gain reported. She is interested in healthy cooking classes. - Continue Mounjaro 15 mg weekly - Encourage participation in healthy cooking classes     Ductal Carcinoma In Situ (DCIS) of the Left Breast Status post-surgery for DCIS with a reducing seroma that did not require  drainage. She has started radiation therapy and will begin oral Tamoxifen. She expressed frustration with the financial burden and lack of clear information from her insurance provider regarding the costs of radiation therapy. - Continue  radiation therapy for 4 weeks - Start oral Tamoxifen - Monitor seroma to ensure it does not interfere with radiation therapy    Type 2 Diabetes Mellitus She is on Mounjaro 15 mg weekly, Jardiance 25 mg daily, and Metformin 1000 mg BID. Blood sugars are generally well-controlled with no significant hypoglycemia. She checks her blood sugars most mornings and before dinner, with occasional readings around 115 mg/dL. Continue/refill Mounjaro 15 mg weekly  - Continue Jardiance 25 mg daily - Continue Metformin 1000 mg BID - Continue regular blood sugar monitoring She is working  on nutrition plan to decrease simple carbohydrates, increase lean proteins and exercise to promote weight loss and improve glycemic control .  Hypertension She is on Lisinopril 5 mg daily with well-controlled blood pressure. She requested a 90-day refill for mail delivery. - Refill Lisinopril 5 mg daily for 90 days and arrange for mail delivery  Hyperlipidemia and Atherosclerosis of the Aortic Arch She is on Zocor 40 mg daily with no new symptoms reported. - Continue Zocor 40 mg daily  Insomnia She reports difficulty sleeping, likely exacerbated by stress related to her recent diagnosis and treatment. Ambien prescribed by her PCP has been effective in improving sleep. She does not intend to use it long-term. - Continue Ambien as needed for short-term use  General Health Maintenance Provided information on local breast cancer support organizations such as Art gallery manager.   Follow-up - Schedule follow-up appointment for March 18th at 4 PM - Ensure she maintains shoulder mobility exercises - Advise her to contact if any issues arise before the next appointment.   Generalized Obesity: Current BMI 39.16  Pharmacotherapy Plan Continue and refill  Mounjaro 15 mg SQ weekly  Cindy Carey is currently in the action stage of change. As such, her goal is to continue with weight loss efforts.  She has agreed to the Category 1 plan and  the Category 2 plan.  Exercise goals: Exercise as able following recent surgery  Behavioral modification strategies: increasing lean protein intake, decreasing simple carbohydrates , no meal skipping, meal planning , increasing vegetables, increasing lower sugar fruits, increasing fiber rich foods, emotional eating strategies, avoiding temptations, and mindful eating.  Cindy Carey has agreed to follow-up with our clinic in 4 weeks.  No orders of the defined types were placed in this encounter.   Medications Discontinued During This Encounter  Medication Reason   lisinopril (ZESTRIL) 5 MG tablet Reorder   tirzepatide (MOUNJARO) 15 MG/0.5ML Pen Reorder     Meds ordered this encounter  Medications   lisinopril (ZESTRIL) 5 MG tablet    Sig: Take 1 tablet (5 mg total) by mouth daily.    Dispense:  30 tablet    Refill:  3   tirzepatide (MOUNJARO) 15 MG/0.5ML Pen    Sig: Inject 15 mg into the skin once a week.    Dispense:  6 mL    Refill:  1      Objective:   VITALS: Per patient if applicable, see vitals. GENERAL: Alert and in no acute distress. CARDIOPULMONARY: No increased WOB. Speaking in clear sentences.  PSYCH: Pleasant and cooperative. Speech normal rate and rhythm. Affect is appropriate. Insight and judgement are appropriate. Attention is focused, linear, and appropriate.  NEURO: Oriented as arrived to appointment on time with no prompting.  Attestation Statements:   Reviewed by clinician on day of visit: allergies, medications, problem list, medical history, surgical history, family history, social history, and previous encounter notes.   Time spent on visit including the items listed below was 30 minutes.  -preparing to see the patient (e.g., review of tests, history, previous notes) -obtaining and/or reviewing separately obtained history -counseling and educating the patient/family/caregiver -documenting clinical information in the electronic or other health  record -ordering medications, tests, or procedures -independently interpreting results and communicating results to the patient/ family/caregiver -referring and communicating with other health care professionals  -care coordination   Shakiera Edelson,PA-C

## 2023-12-06 ENCOUNTER — Other Ambulatory Visit: Payer: Self-pay

## 2023-12-06 ENCOUNTER — Ambulatory Visit
Admission: RE | Admit: 2023-12-06 | Discharge: 2023-12-06 | Disposition: A | Discharge status: Home / Self Care | Payer: Commercial Managed Care - PPO | Source: Ambulatory Visit | Attending: Radiation Oncology

## 2023-12-06 DIAGNOSIS — D0512 Intraductal carcinoma in situ of left breast: Secondary | ICD-10-CM | POA: Diagnosis not present

## 2023-12-06 DIAGNOSIS — Z510 Encounter for antineoplastic radiation therapy: Secondary | ICD-10-CM | POA: Diagnosis not present

## 2023-12-06 DIAGNOSIS — Z170 Estrogen receptor positive status [ER+]: Secondary | ICD-10-CM | POA: Diagnosis not present

## 2023-12-06 LAB — RAD ONC ARIA SESSION SUMMARY
Course Elapsed Days: 1
Plan Fractions Treated to Date: 2
Plan Prescribed Dose Per Fraction: 2.66 Gy
Plan Total Fractions Prescribed: 16
Plan Total Prescribed Dose: 42.56 Gy
Reference Point Dosage Given to Date: 5.32 Gy
Reference Point Session Dosage Given: 2.66 Gy
Session Number: 2

## 2023-12-07 ENCOUNTER — Other Ambulatory Visit: Payer: Self-pay

## 2023-12-07 ENCOUNTER — Ambulatory Visit
Admission: RE | Admit: 2023-12-07 | Discharge: 2023-12-07 | Disposition: A | Discharge status: Home / Self Care | Payer: Commercial Managed Care - PPO | Source: Ambulatory Visit | Attending: Radiation Oncology | Admitting: Radiation Oncology

## 2023-12-07 DIAGNOSIS — Z510 Encounter for antineoplastic radiation therapy: Secondary | ICD-10-CM | POA: Diagnosis not present

## 2023-12-07 DIAGNOSIS — Z170 Estrogen receptor positive status [ER+]: Secondary | ICD-10-CM | POA: Diagnosis not present

## 2023-12-07 DIAGNOSIS — D0512 Intraductal carcinoma in situ of left breast: Secondary | ICD-10-CM | POA: Diagnosis not present

## 2023-12-07 LAB — RAD ONC ARIA SESSION SUMMARY
Course Elapsed Days: 2
Plan Fractions Treated to Date: 3
Plan Prescribed Dose Per Fraction: 2.66 Gy
Plan Total Fractions Prescribed: 16
Plan Total Prescribed Dose: 42.56 Gy
Reference Point Dosage Given to Date: 7.98 Gy
Reference Point Session Dosage Given: 2.66 Gy
Session Number: 3

## 2023-12-08 ENCOUNTER — Ambulatory Visit
Admission: RE | Admit: 2023-12-08 | Discharge: 2023-12-08 | Disposition: A | Discharge status: Home / Self Care | Payer: Commercial Managed Care - PPO | Source: Ambulatory Visit | Attending: Radiation Oncology | Admitting: Radiation Oncology

## 2023-12-08 ENCOUNTER — Other Ambulatory Visit: Payer: Self-pay

## 2023-12-08 DIAGNOSIS — D0512 Intraductal carcinoma in situ of left breast: Secondary | ICD-10-CM | POA: Diagnosis not present

## 2023-12-08 DIAGNOSIS — Z170 Estrogen receptor positive status [ER+]: Secondary | ICD-10-CM | POA: Diagnosis not present

## 2023-12-08 DIAGNOSIS — Z510 Encounter for antineoplastic radiation therapy: Secondary | ICD-10-CM | POA: Diagnosis not present

## 2023-12-08 LAB — RAD ONC ARIA SESSION SUMMARY
Course Elapsed Days: 3
Plan Fractions Treated to Date: 4
Plan Prescribed Dose Per Fraction: 2.66 Gy
Plan Total Fractions Prescribed: 16
Plan Total Prescribed Dose: 42.56 Gy
Reference Point Dosage Given to Date: 10.64 Gy
Reference Point Session Dosage Given: 2.66 Gy
Session Number: 4

## 2023-12-08 MED ORDER — RADIAPLEXRX EX GEL: Freq: Once | CUTANEOUS | Status: AC

## 2023-12-08 MED ORDER — ALRA NON-METALLIC DEODORANT (RAD-ONC)
1.0000 | Freq: Once | TOPICAL | Status: AC
  Administered 2023-12-08: 1 via TOPICAL

## 2023-12-09 ENCOUNTER — Ambulatory Visit
Admission: RE | Admit: 2023-12-09 | Discharge: 2023-12-09 | Disposition: A | Discharge status: Home / Self Care | Payer: Commercial Managed Care - PPO | Source: Ambulatory Visit | Attending: Radiation Oncology | Admitting: Radiation Oncology

## 2023-12-09 ENCOUNTER — Other Ambulatory Visit: Payer: Self-pay

## 2023-12-09 DIAGNOSIS — Z510 Encounter for antineoplastic radiation therapy: Secondary | ICD-10-CM | POA: Diagnosis not present

## 2023-12-09 DIAGNOSIS — Z170 Estrogen receptor positive status [ER+]: Secondary | ICD-10-CM | POA: Diagnosis not present

## 2023-12-09 DIAGNOSIS — D0512 Intraductal carcinoma in situ of left breast: Secondary | ICD-10-CM | POA: Diagnosis not present

## 2023-12-09 LAB — RAD ONC ARIA SESSION SUMMARY
Course Elapsed Days: 4
Plan Fractions Treated to Date: 5
Plan Prescribed Dose Per Fraction: 2.66 Gy
Plan Total Fractions Prescribed: 16
Plan Total Prescribed Dose: 42.56 Gy
Reference Point Dosage Given to Date: 13.3 Gy
Reference Point Session Dosage Given: 2.66 Gy
Session Number: 5

## 2023-12-12 ENCOUNTER — Ambulatory Visit: Payer: Commercial Managed Care - PPO

## 2023-12-12 ENCOUNTER — Other Ambulatory Visit: Payer: Self-pay

## 2023-12-12 ENCOUNTER — Ambulatory Visit
Admission: RE | Admit: 2023-12-12 | Discharge: 2023-12-12 | Disposition: A | Discharge status: Home / Self Care | Payer: Commercial Managed Care - PPO | Source: Ambulatory Visit | Attending: Radiation Oncology | Admitting: Radiation Oncology

## 2023-12-12 DIAGNOSIS — Z170 Estrogen receptor positive status [ER+]: Secondary | ICD-10-CM | POA: Diagnosis not present

## 2023-12-12 DIAGNOSIS — Z510 Encounter for antineoplastic radiation therapy: Secondary | ICD-10-CM | POA: Diagnosis not present

## 2023-12-12 DIAGNOSIS — D0512 Intraductal carcinoma in situ of left breast: Secondary | ICD-10-CM | POA: Diagnosis not present

## 2023-12-12 LAB — RAD ONC ARIA SESSION SUMMARY
Course Elapsed Days: 7
Plan Fractions Treated to Date: 6
Plan Prescribed Dose Per Fraction: 2.66 Gy
Plan Total Fractions Prescribed: 16
Plan Total Prescribed Dose: 42.56 Gy
Reference Point Dosage Given to Date: 15.96 Gy
Reference Point Session Dosage Given: 2.66 Gy
Session Number: 6

## 2023-12-13 ENCOUNTER — Ambulatory Visit
Admission: RE | Admit: 2023-12-13 | Discharge: 2023-12-13 | Disposition: A | Discharge status: Home / Self Care | Payer: Commercial Managed Care - PPO | Source: Ambulatory Visit | Attending: Radiation Oncology | Admitting: Radiation Oncology

## 2023-12-13 ENCOUNTER — Other Ambulatory Visit: Payer: Self-pay

## 2023-12-13 ENCOUNTER — Ambulatory Visit: Payer: Commercial Managed Care - PPO

## 2023-12-13 DIAGNOSIS — D0512 Intraductal carcinoma in situ of left breast: Secondary | ICD-10-CM | POA: Diagnosis not present

## 2023-12-13 DIAGNOSIS — Z510 Encounter for antineoplastic radiation therapy: Secondary | ICD-10-CM | POA: Diagnosis not present

## 2023-12-13 DIAGNOSIS — Z170 Estrogen receptor positive status [ER+]: Secondary | ICD-10-CM | POA: Diagnosis not present

## 2023-12-13 LAB — RAD ONC ARIA SESSION SUMMARY
Course Elapsed Days: 8
Plan Fractions Treated to Date: 7
Plan Prescribed Dose Per Fraction: 2.66 Gy
Plan Total Fractions Prescribed: 16
Plan Total Prescribed Dose: 42.56 Gy
Reference Point Dosage Given to Date: 18.62 Gy
Reference Point Session Dosage Given: 2.66 Gy
Session Number: 7

## 2023-12-14 ENCOUNTER — Ambulatory Visit
Admission: RE | Admit: 2023-12-14 | Discharge: 2023-12-14 | Disposition: A | Discharge status: Home / Self Care | Payer: Commercial Managed Care - PPO | Source: Ambulatory Visit | Attending: Radiation Oncology

## 2023-12-14 ENCOUNTER — Ambulatory Visit: Payer: Commercial Managed Care - PPO

## 2023-12-14 ENCOUNTER — Other Ambulatory Visit: Payer: Self-pay

## 2023-12-14 DIAGNOSIS — Z510 Encounter for antineoplastic radiation therapy: Secondary | ICD-10-CM | POA: Diagnosis not present

## 2023-12-14 DIAGNOSIS — Z170 Estrogen receptor positive status [ER+]: Secondary | ICD-10-CM | POA: Diagnosis not present

## 2023-12-14 DIAGNOSIS — D0512 Intraductal carcinoma in situ of left breast: Secondary | ICD-10-CM | POA: Diagnosis not present

## 2023-12-14 LAB — RAD ONC ARIA SESSION SUMMARY
Course Elapsed Days: 9
Plan Fractions Treated to Date: 8
Plan Prescribed Dose Per Fraction: 2.66 Gy
Plan Total Fractions Prescribed: 16
Plan Total Prescribed Dose: 42.56 Gy
Reference Point Dosage Given to Date: 21.28 Gy
Reference Point Session Dosage Given: 2.66 Gy
Session Number: 8

## 2023-12-15 ENCOUNTER — Other Ambulatory Visit: Payer: Self-pay

## 2023-12-15 ENCOUNTER — Ambulatory Visit: Payer: Commercial Managed Care - PPO

## 2023-12-15 ENCOUNTER — Ambulatory Visit
Admission: RE | Admit: 2023-12-15 | Discharge: 2023-12-15 | Disposition: A | Discharge status: Home / Self Care | Payer: Commercial Managed Care - PPO | Source: Ambulatory Visit | Attending: Radiation Oncology

## 2023-12-15 DIAGNOSIS — Z170 Estrogen receptor positive status [ER+]: Secondary | ICD-10-CM | POA: Diagnosis not present

## 2023-12-15 DIAGNOSIS — D0512 Intraductal carcinoma in situ of left breast: Secondary | ICD-10-CM | POA: Diagnosis not present

## 2023-12-15 DIAGNOSIS — Z510 Encounter for antineoplastic radiation therapy: Secondary | ICD-10-CM | POA: Diagnosis not present

## 2023-12-15 LAB — RAD ONC ARIA SESSION SUMMARY
Course Elapsed Days: 10
Plan Fractions Treated to Date: 9
Plan Prescribed Dose Per Fraction: 2.66 Gy
Plan Total Fractions Prescribed: 16
Plan Total Prescribed Dose: 42.56 Gy
Reference Point Dosage Given to Date: 23.94 Gy
Reference Point Session Dosage Given: 2.66 Gy
Session Number: 9

## 2023-12-16 ENCOUNTER — Ambulatory Visit
Admission: RE | Admit: 2023-12-16 | Discharge: 2023-12-16 | Disposition: A | Discharge status: Home / Self Care | Payer: Commercial Managed Care - PPO | Source: Ambulatory Visit | Attending: Radiation Oncology | Admitting: Radiation Oncology

## 2023-12-16 ENCOUNTER — Other Ambulatory Visit: Payer: Self-pay

## 2023-12-16 ENCOUNTER — Ambulatory Visit: Payer: Commercial Managed Care - PPO

## 2023-12-16 DIAGNOSIS — Z170 Estrogen receptor positive status [ER+]: Secondary | ICD-10-CM | POA: Diagnosis not present

## 2023-12-16 DIAGNOSIS — D0512 Intraductal carcinoma in situ of left breast: Secondary | ICD-10-CM | POA: Diagnosis not present

## 2023-12-16 DIAGNOSIS — Z510 Encounter for antineoplastic radiation therapy: Secondary | ICD-10-CM | POA: Diagnosis not present

## 2023-12-16 LAB — RAD ONC ARIA SESSION SUMMARY
Course Elapsed Days: 11
Plan Fractions Treated to Date: 10
Plan Prescribed Dose Per Fraction: 2.66 Gy
Plan Total Fractions Prescribed: 16
Plan Total Prescribed Dose: 42.56 Gy
Reference Point Dosage Given to Date: 26.6 Gy
Reference Point Session Dosage Given: 2.66 Gy
Session Number: 10

## 2023-12-19 ENCOUNTER — Ambulatory Visit
Admission: RE | Admit: 2023-12-19 | Discharge: 2023-12-19 | Disposition: A | Discharge status: Home / Self Care | Payer: Commercial Managed Care - PPO | Source: Ambulatory Visit | Attending: Radiation Oncology | Admitting: Radiation Oncology

## 2023-12-19 ENCOUNTER — Ambulatory Visit: Payer: Commercial Managed Care - PPO

## 2023-12-19 ENCOUNTER — Other Ambulatory Visit: Payer: Self-pay

## 2023-12-19 DIAGNOSIS — Z170 Estrogen receptor positive status [ER+]: Secondary | ICD-10-CM | POA: Diagnosis not present

## 2023-12-19 DIAGNOSIS — D0512 Intraductal carcinoma in situ of left breast: Secondary | ICD-10-CM | POA: Insufficient documentation

## 2023-12-19 DIAGNOSIS — Z510 Encounter for antineoplastic radiation therapy: Secondary | ICD-10-CM | POA: Insufficient documentation

## 2023-12-19 LAB — RAD ONC ARIA SESSION SUMMARY
Course Elapsed Days: 14
Plan Fractions Treated to Date: 11
Plan Prescribed Dose Per Fraction: 2.66 Gy
Plan Total Fractions Prescribed: 16
Plan Total Prescribed Dose: 42.56 Gy
Reference Point Dosage Given to Date: 29.26 Gy
Reference Point Session Dosage Given: 2.66 Gy
Session Number: 11

## 2023-12-20 ENCOUNTER — Ambulatory Visit: Payer: Commercial Managed Care - PPO

## 2023-12-20 ENCOUNTER — Other Ambulatory Visit: Payer: Self-pay

## 2023-12-20 ENCOUNTER — Ambulatory Visit
Admission: RE | Admit: 2023-12-20 | Discharge: 2023-12-20 | Disposition: A | Discharge status: Home / Self Care | Payer: Commercial Managed Care - PPO | Source: Ambulatory Visit | Attending: Radiation Oncology | Admitting: Radiation Oncology

## 2023-12-20 DIAGNOSIS — D0512 Intraductal carcinoma in situ of left breast: Secondary | ICD-10-CM | POA: Diagnosis not present

## 2023-12-20 DIAGNOSIS — Z510 Encounter for antineoplastic radiation therapy: Secondary | ICD-10-CM | POA: Diagnosis not present

## 2023-12-20 DIAGNOSIS — Z170 Estrogen receptor positive status [ER+]: Secondary | ICD-10-CM | POA: Diagnosis not present

## 2023-12-20 LAB — RAD ONC ARIA SESSION SUMMARY
Course Elapsed Days: 15
Plan Fractions Treated to Date: 12
Plan Prescribed Dose Per Fraction: 2.66 Gy
Plan Total Fractions Prescribed: 16
Plan Total Prescribed Dose: 42.56 Gy
Reference Point Dosage Given to Date: 31.92 Gy
Reference Point Session Dosage Given: 2.66 Gy
Session Number: 12

## 2023-12-21 ENCOUNTER — Ambulatory Visit
Admission: RE | Admit: 2023-12-21 | Discharge: 2023-12-21 | Disposition: A | Discharge status: Home / Self Care | Payer: Commercial Managed Care - PPO | Source: Ambulatory Visit | Attending: Radiation Oncology | Admitting: Radiation Oncology

## 2023-12-21 ENCOUNTER — Ambulatory Visit: Payer: Commercial Managed Care - PPO

## 2023-12-21 ENCOUNTER — Other Ambulatory Visit: Payer: Self-pay

## 2023-12-21 ENCOUNTER — Other Ambulatory Visit (HOSPITAL_COMMUNITY): Payer: Self-pay

## 2023-12-21 DIAGNOSIS — Z170 Estrogen receptor positive status [ER+]: Secondary | ICD-10-CM | POA: Diagnosis not present

## 2023-12-21 DIAGNOSIS — D0512 Intraductal carcinoma in situ of left breast: Secondary | ICD-10-CM | POA: Diagnosis not present

## 2023-12-21 DIAGNOSIS — Z510 Encounter for antineoplastic radiation therapy: Secondary | ICD-10-CM | POA: Diagnosis not present

## 2023-12-21 LAB — RAD ONC ARIA SESSION SUMMARY
Course Elapsed Days: 16
Plan Fractions Treated to Date: 13
Plan Prescribed Dose Per Fraction: 2.66 Gy
Plan Total Fractions Prescribed: 16
Plan Total Prescribed Dose: 42.56 Gy
Reference Point Dosage Given to Date: 34.58 Gy
Reference Point Session Dosage Given: 2.66 Gy
Session Number: 13

## 2023-12-22 ENCOUNTER — Ambulatory Visit
Admission: RE | Admit: 2023-12-22 | Discharge: 2023-12-22 | Disposition: A | Discharge status: Home / Self Care | Payer: Commercial Managed Care - PPO | Source: Ambulatory Visit | Attending: Radiation Oncology | Admitting: Radiation Oncology

## 2023-12-22 ENCOUNTER — Ambulatory Visit: Payer: Commercial Managed Care - PPO

## 2023-12-22 ENCOUNTER — Other Ambulatory Visit: Payer: Self-pay

## 2023-12-22 DIAGNOSIS — Z170 Estrogen receptor positive status [ER+]: Secondary | ICD-10-CM | POA: Diagnosis not present

## 2023-12-22 DIAGNOSIS — Z510 Encounter for antineoplastic radiation therapy: Secondary | ICD-10-CM | POA: Diagnosis not present

## 2023-12-22 DIAGNOSIS — D0512 Intraductal carcinoma in situ of left breast: Secondary | ICD-10-CM | POA: Diagnosis not present

## 2023-12-22 LAB — RAD ONC ARIA SESSION SUMMARY
Course Elapsed Days: 17
Plan Fractions Treated to Date: 14
Plan Prescribed Dose Per Fraction: 2.66 Gy
Plan Total Fractions Prescribed: 16
Plan Total Prescribed Dose: 42.56 Gy
Reference Point Dosage Given to Date: 37.24 Gy
Reference Point Session Dosage Given: 2.66 Gy
Session Number: 14

## 2023-12-23 ENCOUNTER — Ambulatory Visit
Admission: RE | Admit: 2023-12-23 | Discharge: 2023-12-23 | Disposition: A | Discharge status: Home / Self Care | Source: Ambulatory Visit | Attending: Radiation Oncology | Admitting: Radiation Oncology

## 2023-12-23 ENCOUNTER — Other Ambulatory Visit: Payer: Self-pay

## 2023-12-23 ENCOUNTER — Ambulatory Visit: Payer: Commercial Managed Care - PPO | Admitting: Radiation Oncology

## 2023-12-23 ENCOUNTER — Ambulatory Visit: Payer: Commercial Managed Care - PPO

## 2023-12-23 ENCOUNTER — Ambulatory Visit
Admission: RE | Admit: 2023-12-23 | Discharge: 2023-12-23 | Disposition: A | Discharge status: Home / Self Care | Payer: Commercial Managed Care - PPO | Source: Ambulatory Visit | Attending: Radiation Oncology | Admitting: Radiation Oncology

## 2023-12-23 DIAGNOSIS — Z510 Encounter for antineoplastic radiation therapy: Secondary | ICD-10-CM | POA: Diagnosis not present

## 2023-12-23 DIAGNOSIS — D0512 Intraductal carcinoma in situ of left breast: Secondary | ICD-10-CM | POA: Diagnosis not present

## 2023-12-23 DIAGNOSIS — Z170 Estrogen receptor positive status [ER+]: Secondary | ICD-10-CM | POA: Diagnosis not present

## 2023-12-23 LAB — RAD ONC ARIA SESSION SUMMARY
Course Elapsed Days: 18
Plan Fractions Treated to Date: 15
Plan Prescribed Dose Per Fraction: 2.66 Gy
Plan Total Fractions Prescribed: 16
Plan Total Prescribed Dose: 42.56 Gy
Reference Point Dosage Given to Date: 39.9 Gy
Reference Point Session Dosage Given: 2.66 Gy
Session Number: 15

## 2023-12-26 ENCOUNTER — Ambulatory Visit: Payer: Commercial Managed Care - PPO

## 2023-12-26 ENCOUNTER — Ambulatory Visit
Admission: RE | Admit: 2023-12-26 | Discharge: 2023-12-26 | Disposition: A | Discharge status: Home / Self Care | Payer: Commercial Managed Care - PPO | Source: Ambulatory Visit | Attending: Radiation Oncology | Admitting: Radiation Oncology

## 2023-12-26 ENCOUNTER — Other Ambulatory Visit: Payer: Self-pay

## 2023-12-26 DIAGNOSIS — Z170 Estrogen receptor positive status [ER+]: Secondary | ICD-10-CM | POA: Diagnosis not present

## 2023-12-26 DIAGNOSIS — D0512 Intraductal carcinoma in situ of left breast: Secondary | ICD-10-CM | POA: Diagnosis not present

## 2023-12-26 DIAGNOSIS — Z510 Encounter for antineoplastic radiation therapy: Secondary | ICD-10-CM | POA: Diagnosis not present

## 2023-12-26 LAB — RAD ONC ARIA SESSION SUMMARY
Course Elapsed Days: 21
Plan Fractions Treated to Date: 16
Plan Prescribed Dose Per Fraction: 2.66 Gy
Plan Total Fractions Prescribed: 16
Plan Total Prescribed Dose: 42.56 Gy
Reference Point Dosage Given to Date: 42.56 Gy
Reference Point Session Dosage Given: 2.66 Gy
Session Number: 16

## 2023-12-27 ENCOUNTER — Ambulatory Visit
Admission: RE | Admit: 2023-12-27 | Discharge: 2023-12-27 | Disposition: A | Discharge status: Home / Self Care | Payer: Commercial Managed Care - PPO | Source: Ambulatory Visit | Attending: Radiation Oncology

## 2023-12-27 ENCOUNTER — Ambulatory Visit: Payer: Commercial Managed Care - PPO

## 2023-12-27 ENCOUNTER — Other Ambulatory Visit: Payer: Self-pay

## 2023-12-27 DIAGNOSIS — Z170 Estrogen receptor positive status [ER+]: Secondary | ICD-10-CM | POA: Diagnosis not present

## 2023-12-27 DIAGNOSIS — Z510 Encounter for antineoplastic radiation therapy: Secondary | ICD-10-CM | POA: Diagnosis not present

## 2023-12-27 DIAGNOSIS — D0512 Intraductal carcinoma in situ of left breast: Secondary | ICD-10-CM | POA: Diagnosis not present

## 2023-12-27 LAB — RAD ONC ARIA SESSION SUMMARY
Course Elapsed Days: 22
Plan Fractions Treated to Date: 1
Plan Prescribed Dose Per Fraction: 2 Gy
Plan Total Fractions Prescribed: 4
Plan Total Prescribed Dose: 8 Gy
Reference Point Dosage Given to Date: 2 Gy
Reference Point Session Dosage Given: 2 Gy
Session Number: 17

## 2023-12-28 ENCOUNTER — Other Ambulatory Visit: Payer: Self-pay

## 2023-12-28 ENCOUNTER — Ambulatory Visit: Payer: Commercial Managed Care - PPO

## 2023-12-28 ENCOUNTER — Ambulatory Visit
Admission: RE | Admit: 2023-12-28 | Discharge: 2023-12-28 | Disposition: A | Discharge status: Home / Self Care | Payer: Commercial Managed Care - PPO | Source: Ambulatory Visit | Attending: Radiation Oncology

## 2023-12-28 DIAGNOSIS — Z510 Encounter for antineoplastic radiation therapy: Secondary | ICD-10-CM | POA: Diagnosis not present

## 2023-12-28 DIAGNOSIS — D0512 Intraductal carcinoma in situ of left breast: Secondary | ICD-10-CM | POA: Diagnosis not present

## 2023-12-28 LAB — RAD ONC ARIA SESSION SUMMARY
Course Elapsed Days: 23
Plan Fractions Treated to Date: 2
Plan Prescribed Dose Per Fraction: 2 Gy
Plan Total Fractions Prescribed: 4
Plan Total Prescribed Dose: 8 Gy
Reference Point Dosage Given to Date: 4 Gy
Reference Point Session Dosage Given: 2 Gy
Session Number: 18

## 2023-12-29 ENCOUNTER — Ambulatory Visit: Payer: Commercial Managed Care - PPO

## 2023-12-29 ENCOUNTER — Other Ambulatory Visit: Payer: Self-pay

## 2023-12-29 ENCOUNTER — Ambulatory Visit
Admission: RE | Admit: 2023-12-29 | Discharge: 2023-12-29 | Disposition: A | Discharge status: Home / Self Care | Source: Ambulatory Visit | Attending: Radiation Oncology | Admitting: Radiation Oncology

## 2023-12-29 ENCOUNTER — Ambulatory Visit
Admission: RE | Admit: 2023-12-29 | Discharge: 2023-12-29 | Disposition: A | Discharge status: Home / Self Care | Payer: Commercial Managed Care - PPO | Source: Ambulatory Visit | Attending: Radiation Oncology | Admitting: Radiation Oncology

## 2023-12-29 DIAGNOSIS — Z510 Encounter for antineoplastic radiation therapy: Secondary | ICD-10-CM | POA: Diagnosis not present

## 2023-12-29 DIAGNOSIS — D0512 Intraductal carcinoma in situ of left breast: Secondary | ICD-10-CM | POA: Diagnosis not present

## 2023-12-29 LAB — RAD ONC ARIA SESSION SUMMARY
Course Elapsed Days: 24
Plan Fractions Treated to Date: 3
Plan Prescribed Dose Per Fraction: 2 Gy
Plan Total Fractions Prescribed: 4
Plan Total Prescribed Dose: 8 Gy
Reference Point Dosage Given to Date: 6 Gy
Reference Point Session Dosage Given: 2 Gy
Session Number: 19

## 2023-12-30 ENCOUNTER — Ambulatory Visit: Payer: Commercial Managed Care - PPO

## 2023-12-30 ENCOUNTER — Ambulatory Visit
Admission: RE | Admit: 2023-12-30 | Discharge: 2023-12-30 | Disposition: A | Discharge status: Home / Self Care | Payer: Commercial Managed Care - PPO | Source: Ambulatory Visit | Attending: Radiation Oncology | Admitting: Radiation Oncology

## 2023-12-30 ENCOUNTER — Other Ambulatory Visit: Payer: Self-pay

## 2023-12-30 DIAGNOSIS — Z510 Encounter for antineoplastic radiation therapy: Secondary | ICD-10-CM | POA: Diagnosis not present

## 2023-12-30 DIAGNOSIS — D0512 Intraductal carcinoma in situ of left breast: Secondary | ICD-10-CM | POA: Diagnosis not present

## 2023-12-30 DIAGNOSIS — Z170 Estrogen receptor positive status [ER+]: Secondary | ICD-10-CM | POA: Diagnosis not present

## 2023-12-30 LAB — RAD ONC ARIA SESSION SUMMARY
Course Elapsed Days: 25
Plan Fractions Treated to Date: 4
Plan Prescribed Dose Per Fraction: 2 Gy
Plan Total Fractions Prescribed: 4
Plan Total Prescribed Dose: 8 Gy
Reference Point Dosage Given to Date: 8 Gy
Reference Point Session Dosage Given: 2 Gy
Session Number: 20

## 2024-01-02 ENCOUNTER — Other Ambulatory Visit (HOSPITAL_COMMUNITY): Payer: Self-pay

## 2024-01-02 ENCOUNTER — Encounter (HOSPITAL_COMMUNITY): Payer: Self-pay | Admitting: Pharmacist

## 2024-01-02 ENCOUNTER — Ambulatory Visit: Payer: Commercial Managed Care - PPO

## 2024-01-02 NOTE — Progress Notes (Unsigned)
 SUBJECTIVE: Discussed the use of AI scribe software for clinical note transcription with the patient, who gave verbal consent to proceed.  Chief Complaint: Obesity  Interim History: She is down 4 lbs since last visit Down 34 lbs overall TBW loss of 13.5%  Gabrella is here to discuss her progress with her obesity treatment plan. She is on the Category 1 Plan and Category 2 Plan and states she is following her eating plan approximately 50 % of the time. She states she is exercising walking 10 minutes 5 times per week.  Cindy Carey is a 68 year old female who presents for follow-up of her obesity treatment plan.  She is actively managing her weight and has lost four pounds despite recent stressors. Although she has not been attending the gym for the past two months, she maintains physical activity by walking approximately 3,000 steps daily during her radiation therapy sessions. No significant fatigue is reported during her recent radiation therapy.  She has a history of type 2 diabetes and is currently on Jardiance 25 mg daily, metformin 1000 mg twice daily, and Mounjaro 15 mg once weekly. Her blood sugars are well-controlled, typically in the 80s to 90s in the morning and never above 110 before dinner. She experienced an issue with a Mounjaro pen that was resolved by the manufacturer. She celebrated the end of her radiation therapy with a small piece of chocolate cake, which did not significantly affect her blood sugar levels.  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 She reports no significant fatigue during radiation therapy but did experience some dermatitis,which has improved since completing therapy.   She is managing hyperlipidemia with simvastatin 40 mg daily and has no reported issues with this medication.  She is on lisinopril 5 mg daily for  hypertension and has no reported issues with these medications.  She continues to work without taking time off and manages her responsibilities despite her medical treatments.  OBJECTIVE: Visit Diagnoses: Problem List Items Addressed This Visit     Diabetes (HCC) - Primary   Relevant Medications   tirzepatide (MOUNJARO) 15 MG/0.5ML Pen   Hypertension associated with diabetes (HCC)   Relevant Medications   tirzepatide (MOUNJARO) 15 MG/0.5ML Pen   Vitamin D deficiency   Obesity (HCC)- Start BMI 46.41   Relevant Medications   tirzepatide (MOUNJARO) 15 MG/0.5ML Pen   Other Visit Diagnoses       BMI 38.0-38.9,adult Current BMI 38.6         Obesity   She is being followed up for obesity management. She has successfully lost four pounds despite recent stressors, including radiation therapy for DCIS. She has been maintaining her muscle mass and not gaining adipose tissue. She has been incorporating physical activity by walking approximately 3,000 steps during her radiation therapy sessions.   - Continue current obesity management plan.   - Encourage continued physical activity and healthy lifestyle choices.  Type 2 Diabetes Mellitus   Type 2 diabetes is being managed with Jardiance 25 mg daily, metformin 1000 mg twice daily, and Mounjaro 15 mg once weekly. Blood glucose levels are well-controlled, with morning readings in the 80s to 90s and pre-dinner readings below 110 mg/dL. No significant issues with medications, except for a defective Mounjaro pen which was replaced by the manufacturer. She is satisfied with her current regimen and has experienced stable blood glucose levels  even after consuming occasional treats.   - Refill Mounjaro prescription.   - Continue current diabetes medications and monitoring regimen. She is working  on nutrition plan to decrease simple carbohydrates, increase lean proteins and exercise to promote weight loss and improve glycemic control . Meds ordered this  encounter  Medications   tirzepatide (MOUNJARO) 15 MG/0.5ML Pen    Sig: Inject 15 mg into the skin once a week.    Dispense:  6 mL    Refill:  1     Ductal Carcinoma In Situ (DCIS) of the Left Breast   She recently completed radiation therapy following surgery for DCIS of the left breast. She is currently on tamoxifen 20 mg daily for hormonal therapy. She reported minimal side effects from radiation, including mild dermatitis, but no fatigue. She feels fortunate that the cancer was detected and treated successfully. She is aware of the potential for recurrence but is reassured by the lower recurrence rates associated with her type of cancer.   - Continue tamoxifen 20 mg daily.   Hypertension   Hypertension is being managed with lisinopril 5 mg daily.  BP Readings from Last 3 Encounters:  01/03/24 130/73  11/30/23 128/74  11/18/23 135/70   Lab Results  Component Value Date   NA 137 10/26/2023   CL 107 10/26/2023   K 4.5 10/26/2023   CO2 21 (L) 10/26/2023   BUN 12 10/26/2023   CREATININE 0.69 10/26/2023   GFRNONAA >60 10/26/2023   CALCIUM 9.3 10/26/2023   ALBUMIN 4.2 07/21/2023   GLUCOSE 102 (H) 10/26/2023   Continue to work on nutrition plan to promote weight loss and improve BP control.     General Health Maintenance   She has been proactive in maintaining her health by incorporating physical activity into her routine. She is also considering future health insurance options and is aware of the potential costs associated with medical care post-retirement. She is exploring Medicare options and comparing them to her current insurance to better plan for retirement.   - Encourage continued engagement in physical activity.   - Discuss health insurance options and potential costs.  Follow-up   She is doing well overall and has been managing her conditions effectively.   - Schedule follow-up appointment for February 13, 2024, at 7:30 AM.  Vitals Temp: 97.8 F (36.6 C) BP:  130/73 Pulse Rate: 71 SpO2: 97 %   Anthropometric Measurements Height: 5\' 3"  (1.6 m) Weight: 217 lb (98.4 kg) BMI (Calculated): 38.45 Weight at Last Visit: 221 lb Weight Lost Since Last Visit: 4 lb Weight Gained Since Last Visit: 0 Starting Weight: 251 lb Total Weight Loss (lbs): 34 lb (15.4 kg)   Body Composition  Body Fat %: 48 % Fat Mass (lbs): 104.6 lbs Muscle Mass (lbs): 107.4 lbs Total Body Water (lbs): 79.6 lbs Visceral Fat Rating : 15   Other Clinical Data Fasting: no Labs: no Today's Visit #: 32 Starting Date: 05/20/21     ASSESSMENT AND PLAN:  Diet: Cindy Carey is currently in the action stage of change. As such, her goal is to continue with weight loss efforts and has agreed to the Category 1 Plan and the Category 2 Plan.   Exercise:  Older adults should follow the adult guidelines. When older adults cannot meet the adult guidelines, they should be as physically active as their abilities and conditions will allow. and Older adults should determine their level of effort for physical activity relative to their level of fitness.  Behavior Modification:  We discussed the following Behavioral Modification Strategies today: increasing lean protein intake, decreasing simple carbohydrates, increasing vegetables, increase H2O intake, increase high fiber foods, no skipping meals, meal planning and cooking strategies, avoiding temptations, and planning for success. We discussed various medication options to help Cindy Carey with her weight loss efforts and we both agreed to continue Mounjaro 15 mg weekly for Type 2 diabetes and continue other current medications, continue to work on nutritional and behavioral strategies to promote weight loss.  .  Return in about 6 weeks (around 02/14/2024).[name] She was informed of the importance of frequent follow up visits to maximize her success with intensive lifestyle modifications for her multiple health conditions.  Attestation Statements:    Reviewed by clinician on day of visit: allergies, medications, problem list, medical history, surgical history, family history, social history, and previous encounter notes.   Time spent on visit including pre-visit chart review and post-visit care and charting was 29 minutes  Cindy Griffiths,PA-C

## 2024-01-02 NOTE — Radiation Completion Notes (Addendum)
  Radiation Oncology         [phone] ________________________________  Name: Cindy Carey MRN: [id number]  Date of Service: 12/30/2023  DOB: June 07, 1956  End of Treatment Note    Diagnosis: Intermediate Grade, ER positive DCIS of the left breast  Intent: Curative     ==========DELIVERED PLANS==========  First Treatment Date: 2023-12-05 Last Treatment Date: 2023-12-30   Plan Name: Breast_L_BH Site: Breast, Left Technique: 3D Mode: Photon Dose Per Fraction: 2.66 Gy Prescribed Dose (Delivered / Prescribed): 42.56 Gy / 42.56 Gy Prescribed Fxs (Delivered / Prescribed): 16 / 16   Plan Name: Brst_L_Bst_BH Site: Breast, Left Technique: 3D Mode: Photon Dose Per Fraction: 2 Gy Prescribed Dose (Delivered / Prescribed): 8 Gy / 8 Gy Prescribed Fxs (Delivered / Prescribed): 4 / 4     ==========ON TREATMENT VISIT DATES========== 2023-12-08, 2023-12-16, 2023-12-23, 2023-12-29      See weekly On Treatment Notes in Epic for details in the Media tab (listed as Progress notes on the On Treatment Visit Dates listed above). The patient tolerated radiation. She developed fatigue and anticipated skin changes in the treatment field.   The patient will receive a call in about one month from the radiation oncology department. She will continue follow up with Dr. Al Pimple as well.      Osker Mason, PAC

## 2024-01-03 ENCOUNTER — Ambulatory Visit (INDEPENDENT_AMBULATORY_CARE_PROVIDER_SITE_OTHER): Payer: Commercial Managed Care - PPO | Admitting: Physician Assistant

## 2024-01-03 ENCOUNTER — Other Ambulatory Visit (HOSPITAL_COMMUNITY): Payer: Self-pay

## 2024-01-03 ENCOUNTER — Ambulatory Visit: Payer: Commercial Managed Care - PPO

## 2024-01-03 ENCOUNTER — Encounter (INDEPENDENT_AMBULATORY_CARE_PROVIDER_SITE_OTHER): Payer: Self-pay | Admitting: Physician Assistant

## 2024-01-03 VITALS — BP 130/73 | HR 71 | Temp 97.8°F | Ht 63.0 in | Wt 217.0 lb

## 2024-01-03 DIAGNOSIS — D0512 Intraductal carcinoma in situ of left breast: Secondary | ICD-10-CM | POA: Diagnosis not present

## 2024-01-03 DIAGNOSIS — E669 Obesity, unspecified: Secondary | ICD-10-CM

## 2024-01-03 DIAGNOSIS — E1169 Type 2 diabetes mellitus with other specified complication: Secondary | ICD-10-CM

## 2024-01-03 DIAGNOSIS — Z6838 Body mass index (BMI) 38.0-38.9, adult: Secondary | ICD-10-CM | POA: Diagnosis not present

## 2024-01-03 DIAGNOSIS — I152 Hypertension secondary to endocrine disorders: Secondary | ICD-10-CM

## 2024-01-03 DIAGNOSIS — Z7985 Long-term (current) use of injectable non-insulin antidiabetic drugs: Secondary | ICD-10-CM | POA: Diagnosis not present

## 2024-01-03 DIAGNOSIS — E1159 Type 2 diabetes mellitus with other circulatory complications: Secondary | ICD-10-CM

## 2024-01-03 MED ORDER — TIRZEPATIDE 15 MG/0.5ML ~~LOC~~ SOAJ
15.0000 mg | SUBCUTANEOUS | 1 refills | Status: DC
  Filled 2024-01-03 – 2024-01-26 (×3): qty 6, 84d supply, fill #0

## 2024-01-04 ENCOUNTER — Ambulatory Visit: Payer: Commercial Managed Care - PPO

## 2024-01-04 ENCOUNTER — Telehealth: Payer: Self-pay

## 2024-01-04 NOTE — Telephone Encounter (Signed)
 Effectiveness of Out-of-Pocket Psychologist, forensic ([employer]) in Cancer Patients  Called patient to discuss study. No answer, left VM with my contact information.  Margret Chance Riku Buttery, RN, BSN, [facility] She  Her  Hers Clinical Research Nurse [facility] Direct Dial [phone] 01/04/2024 10:01 AM

## 2024-01-05 ENCOUNTER — Ambulatory Visit: Payer: Commercial Managed Care - PPO

## 2024-01-06 ENCOUNTER — Ambulatory Visit: Payer: Commercial Managed Care - PPO

## 2024-01-15 ENCOUNTER — Encounter: Payer: Self-pay | Admitting: Hematology and Oncology

## 2024-01-16 ENCOUNTER — Other Ambulatory Visit: Payer: Self-pay | Admitting: Radiation Oncology

## 2024-01-16 ENCOUNTER — Encounter: Payer: Self-pay | Admitting: Radiation Oncology

## 2024-01-16 ENCOUNTER — Other Ambulatory Visit (HOSPITAL_COMMUNITY): Payer: Self-pay

## 2024-01-16 MED ORDER — NYSTATIN 100000 UNIT/GM EX CREA
1.0000 | TOPICAL_CREAM | Freq: Three times a day (TID) | CUTANEOUS | 1 refills | Status: DC
  Filled 2024-01-16: qty 30, 10d supply, fill #0
  Filled 2024-01-26: qty 30, 10d supply, fill #1

## 2024-01-17 ENCOUNTER — Other Ambulatory Visit (HOSPITAL_COMMUNITY): Payer: Self-pay

## 2024-01-17 ENCOUNTER — Encounter (INDEPENDENT_AMBULATORY_CARE_PROVIDER_SITE_OTHER): Payer: Self-pay | Admitting: Physician Assistant

## 2024-01-17 DIAGNOSIS — E1159 Type 2 diabetes mellitus with other circulatory complications: Secondary | ICD-10-CM

## 2024-01-17 MED ORDER — LISINOPRIL 5 MG PO TABS
5.0000 mg | ORAL_TABLET | Freq: Every day | ORAL | 0 refills | Status: DC
  Filled 2024-01-19: qty 90, 90d supply, fill #0

## 2024-01-17 NOTE — Addendum Note (Signed)
 Addended by: Dwyane Luo on: 01/17/2024 02:23 PM   Modules accepted: Orders

## 2024-01-18 ENCOUNTER — Other Ambulatory Visit: Payer: Self-pay

## 2024-01-18 DIAGNOSIS — D0512 Intraductal carcinoma in situ of left breast: Secondary | ICD-10-CM

## 2024-01-18 NOTE — Research (Signed)
 Effectiveness of Out-of-Pocket Psychologist, forensic ([employer]) in Cancer Patients  Sent email to patient (preferred method of contact at this time) to follow up on study interest. Deadline for enrollment is 01/26/24.  Margret Chance Aletta Edmunds, RN, BSN, [facility] She  Her  Hers Clinical Research Nurse [facility] Direct Dial [phone] 01/18/2024 9:50 AM

## 2024-01-19 ENCOUNTER — Other Ambulatory Visit: Payer: Self-pay

## 2024-01-19 ENCOUNTER — Other Ambulatory Visit (HOSPITAL_COMMUNITY): Payer: Self-pay

## 2024-01-20 ENCOUNTER — Other Ambulatory Visit: Payer: Self-pay

## 2024-01-20 ENCOUNTER — Inpatient Hospital Stay: Attending: Hematology and Oncology

## 2024-01-20 ENCOUNTER — Encounter

## 2024-01-20 DIAGNOSIS — D0512 Intraductal carcinoma in situ of left breast: Secondary | ICD-10-CM

## 2024-01-20 NOTE — Research (Signed)
 Effectiveness of Out-of-Pocket Psychologist, forensic ([employer]) in Cancer Patients  Patient Cindy Carey was identified by Dr Al Pimple as a potential candidate for the above listed study.  This Clinical Research Nurse met with Cindy Carey, [id number] on 01/20/24 in a manner and location that ensures patient privacy to discuss participation in the above listed research study.  Patient is Unaccompanied.  Patient was previously provided with informed consent documents.  Patient confirmed they have read the informed consent documents.  As outlined in the informed consent form, this Nurse and Cindy Carey discussed the purpose of the research study, the investigational nature of the study, study procedures and requirements for study participation, potential risks and benefits of study participation, as well as alternatives to participation.  This study is not blinded or double-blinded. The patient understands participation is voluntary and they may withdraw from study participation at any time.  Each study arm was reviewed, and randomization discussed.  This study does not involve an investigational drug or device. This study does not involve a placebo. Patient understands enrollment is pending full eligibility review.   Confidentiality and how the patient's information will be used as part of study participation were discussed.  Patient was informed there is reimbursement provided for their time and effort spent on trial participation.  The patient is encouraged to discuss research study participation with their insurance provider to determine what costs they may incur as part of study participation, including research related injury.    All questions were answered to patient's satisfaction.  The informed consent and separate HIPAA Authorization was reviewed page by page.  The patient's mental and emotional status is appropriate to provide informed consent, and the patient  verbalizes an understanding of study participation.  Patient has agreed to participate in the above listed research study and has voluntarily signed the informed consent version 08/18/23 and separate HIPAA Authorization, version 12/16/22  on 01/20/24 at 1050AM.  The patient was provided with a copy of the signed informed consent form and separate HIPAA Authorization for their reference.  No study specific procedures were obtained prior to the signing of the informed consent document.  Approximately 15 minutes were spent with the patient reviewing the informed consent documents.  Patient was not requested to complete a Release of Information form.  Patient reads, speaks, and understands [place]. Baseline questionnaires were completed. Gift card preference is to [employer]. Patient is not enrolled in any other clinical trials. Patient intends to receive care at [facility].  Cindy Chance Jase Reep, RN, BSN, [facility] She  Her  Hers Clinical Research Nurse [facility] Direct Dial [phone] 01/20/2024 11:11 AM

## 2024-01-20 NOTE — Research (Signed)
 Effectiveness of Out-of-Pocket Psychologist, forensic ([employer]) in Cancer Patients  This Nurse has reviewed this patient's inclusion and exclusion criteria and confirmed Cindy Carey is eligible for study participation.  Patient will continue with enrollment.  Eligibility confirmed by treating investigator, who also agrees that patient should proceed with enrollment.   [name], RN, BSN, [facility] She  Her  Hers Clinical Research Nurse [facility] Direct Dial [phone] 01/20/2024 11:00 AM

## 2024-01-23 DIAGNOSIS — D0512 Intraductal carcinoma in situ of left breast: Secondary | ICD-10-CM

## 2024-01-23 NOTE — Research (Cosign Needed Addendum)
 Effectiveness of Out-of-Pocket Psychologist, forensic ([employer]) in Cancer Patients  This Nurse has reviewed this patient's inclusion and exclusion criteria and confirmed Cindy Carey is eligible for study participation.  Patient will continue with enrollment.  Eligibility confirmed by treating investigator, who also agrees that patient should proceed with enrollment.  Margret Chance Rayvin Abid, RN, BSN, [facility] She  Her  Hers Clinical Research Nurse [facility] Direct Dial [phone] 01/23/2024 10:00 AM  Notified patient of registration and randomization to [employer] arm; they will reach out to patient to schedule baseline appt. Provided [employer] number so she recognizes the number. Patient verbalized understanding.  Margret Chance Denzal Meir, RN, BSN, [facility] She  Her  Hers Clinical Research Nurse [facility] Direct Dial [phone] 01/23/2024 11:18 AM

## 2024-01-23 NOTE — Research (Signed)
 Effectiveness of Out-of-Pocket Psychologist, forensic ([employer]) in Cancer Patients     This Nurse has reviewed this patient's inclusion and exclusion criteria as a second review and confirms Cindy Carey is eligible for study participation.  Patient may continue with enrollment.  Chriss Driver, RN 01/23/24 10:01 AM

## 2024-01-24 ENCOUNTER — Other Ambulatory Visit (HOSPITAL_COMMUNITY): Payer: Self-pay

## 2024-01-24 DIAGNOSIS — L589 Radiodermatitis, unspecified: Secondary | ICD-10-CM | POA: Diagnosis not present

## 2024-01-24 MED ORDER — PREDNISONE 20 MG PO TABS
ORAL_TABLET | Freq: Two times a day (BID) | ORAL | 0 refills | Status: DC
Start: 2024-01-24 — End: 2024-02-13
  Filled 2024-01-24: qty 15, 5d supply, fill #0

## 2024-01-26 ENCOUNTER — Other Ambulatory Visit: Payer: Self-pay

## 2024-01-26 ENCOUNTER — Other Ambulatory Visit (HOSPITAL_COMMUNITY): Payer: Self-pay

## 2024-01-26 ENCOUNTER — Encounter (HOSPITAL_COMMUNITY): Payer: Self-pay

## 2024-01-26 ENCOUNTER — Other Ambulatory Visit (INDEPENDENT_AMBULATORY_CARE_PROVIDER_SITE_OTHER): Payer: Self-pay | Admitting: Physician Assistant

## 2024-01-26 DIAGNOSIS — K219 Gastro-esophageal reflux disease without esophagitis: Secondary | ICD-10-CM

## 2024-01-27 ENCOUNTER — Other Ambulatory Visit: Payer: Self-pay

## 2024-01-27 NOTE — Progress Notes (Addendum)
  Radiation Oncology         [phone]) [phone] ________________________________  Name: Cindy Carey MRN: [id number]  Date of Service: 01/27/2024  DOB: [birth year]  Post Treatment Telephone Note  Diagnosis:  Intermediate Grade, ER positive DCIS of the left breast (as documented in provider EOT note)  The patient was available for call today.   Symptoms of fatigue have improved since completing therapy.  Symptoms of skin changes have improved mildly (rash) since completing therapy.  The patient was encouraged to avoid sun exposure in the area of prior treatment for up to one year following radiation with either sunscreen or by the style of clothing worn in the sun.  The patient has scheduled follow up with her medical oncologist Dr. Al Pimple for ongoing surveillance, and was encouraged to call if she develops concerns or questions regarding radiation.   This concludes the interaction.  Ruel Favors, LPN

## 2024-01-30 ENCOUNTER — Ambulatory Visit
Admission: RE | Admit: 2024-01-30 | Discharge: 2024-01-30 | Disposition: A | Discharge status: Home / Self Care | Source: Ambulatory Visit | Attending: Family Medicine | Admitting: Family Medicine

## 2024-02-07 DIAGNOSIS — R194 Change in bowel habit: Secondary | ICD-10-CM | POA: Diagnosis not present

## 2024-02-07 DIAGNOSIS — E785 Hyperlipidemia, unspecified: Secondary | ICD-10-CM | POA: Diagnosis not present

## 2024-02-07 DIAGNOSIS — E119 Type 2 diabetes mellitus without complications: Secondary | ICD-10-CM | POA: Diagnosis not present

## 2024-02-07 DIAGNOSIS — I10 Essential (primary) hypertension: Secondary | ICD-10-CM | POA: Diagnosis not present

## 2024-02-12 NOTE — Progress Notes (Unsigned)
 SUBJECTIVE: Discussed the use of AI scribe software for clinical note transcription with the patient, who gave verbal consent to proceed.  Chief Complaint: Obesity  Interim History: She is down 1 lb since last visit. Down 35 lbs overall TBW loss of 13.9%  Cindy Carey is here to discuss her progress with her obesity treatment plan. She is on the Category 1 Plan and Category 2 Plan and states she is following her eating plan approximately 50 % of the time. She states she is exercising walking 60 minutes 7 times per week.  Cindy Carey is a 68 year old female who presents for a follow-up on her obesity treatment plan.  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 She is currently on Jardiance  25 mg daily, Metformin  XR 1000 mg twice daily, and Mounjaro  15 mg weekly for management of type 2 diabetes. She takes Lisinopril  5 mg daily for blood pressure control and renal preservation, and Zocor  40 mg daily for hyperlipidemia.  She reports a recent weight loss of one pound, which she attributes to the effects of prednisone  taken for radiation-induced dermatitis. Prednisone  was taken at a dose of 20 mg daily for five days without tapering, and she describes it as making her 'ravenous' with increased fluid retention during its use.Her dermatitis has fortunately resolved.   She experiences intermittent nausea, particularly at night or in the morning, which she associates with her Mounjaro  injection days. She occasionally takes Zofran  for this, without experiencing constipation or other side effects.  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  OBJECTIVE: Visit Diagnoses: Problem List Items Addressed This Visit     Diabetes  (HCC) - Primary   Relevant Medications   ondansetron  (ZOFRAN ) 4 MG tablet   tirzepatide  (MOUNJARO ) 15 MG/0.5ML Pen   Hypertension associated with diabetes (HCC)   Relevant Medications   tirzepatide  (MOUNJARO ) 15 MG/0.5ML Pen   Hyperlipidemia associated with type 2 diabetes mellitus (HCC)   Relevant Medications   tirzepatide  (MOUNJARO ) 15 MG/0.5ML Pen   Obesity (HCC)- Start BMI 46.41   Relevant Medications   tirzepatide  (MOUNJARO ) 15 MG/0.5ML Pen   Other Visit Diagnoses       BMI 38.0-38.9,adult Current BMI 38.3          Obesity Obesity management is ongoing with a focus on gradual weight loss. Recent prednisone  use for radiation-induced dermatitis led to increased appetite and fluid retention, but muscle mass has increased and adipose percentage is decreasing. Current medications include Mounjaro  15 mg weekly, which is associated with intermittent nausea, particularly around injection days. Despite these challenges, she is making good progress with weight management. - Continue Mounjaro  15 mg weekly - Monitor weight and body composition - Encourage daily walking and physical activity - Discuss healthy eating habits and address emotional eating challenges  Type 2 diabetes mellitus Type 2 diabetes is managed with Jardiance , metformin , and Mounjaro . Blood sugar control is a focus, with attention to dietary habits and the impact of emotional eating. She is encouraged to maintain healthy eating habits to support blood sugar management. - Continue Jardiance  25 mg daily - Continue metformin  XR 1000 mg twice daily Continue Mounjaro  15 mg weekly - Monitor blood glucose levels regularly She is working  on nutrition plan to decrease simple  carbohydrates, increase lean proteins and exercise to promote weight loss and improve glycemic control . Meds ordered this encounter  Medications   ondansetron  (ZOFRAN ) 4 MG tablet    Sig: Take 1 tablet (4 mg total) by mouth every 8 (eight) hours as  needed for nausea or vomiting.    Dispense:  20 tablet    Refill:  0   tirzepatide  (MOUNJARO ) 15 MG/0.5ML Pen    Sig: Inject 15 mg into the skin once a week.    Dispense:  6 mL    Refill:  1    Hypertension Hypertension is well-controlled with current medication regimen. Recent blood pressure reading was 102/66 mmHg with a heart rate of 64 bpm. BP Readings from Last 3 Encounters:  02/13/24 102/66  01/03/24 130/73  11/30/23 128/74   Lab Results  Component Value Date   NA 137 10/26/2023   CL 107 10/26/2023   K 4.5 10/26/2023   CO2 21 (L) 10/26/2023   BUN 12 10/26/2023   CREATININE 0.69 10/26/2023   GFRNONAA >60 10/26/2023   CALCIUM 9.3 10/26/2023   ALBUMIN 4.2 07/21/2023   GLUCOSE 102 (H) 10/26/2023   Continue to work on nutrition plan to promote weight loss and improve BP control.  - Continue lisinopril  5 mg daily  Hyperlipidemia Hyperlipidemia is well-managed with current medication. Recent cholesterol levels are reported as excellent. Lab Results  Component Value Date   CHOL 127 03/01/2023   CHOL 132 08/03/2022   CHOL 137 12/14/2021   Lab Results  Component Value Date   HDL 77 03/01/2023   HDL 67 08/03/2022   HDL 70 12/14/2021   Lab Results  Component Value Date   LDLCALC 34 03/01/2023   LDLCALC 46 08/03/2022   LDLCALC 49 12/14/2021   Lab Results  Component Value Date   TRIG 82 03/01/2023   TRIG 103 08/03/2022   TRIG 100 12/14/2021   Lab Results  Component Value Date   CHOLHDL 2.0 08/03/2022   CHOLHDL 1.9 03/09/2021   CHOLHDL 1.7 12/20/2019   No results found for: "LDLDIRECT" Continue to work on nutrition plan -decreasing simple carbohydrates, increasing lean proteins, decreasing saturated fats and cholesterol , avoiding trans fats and exercise as able to promote weight loss, improve lipids and decrease cardiovascular risks. - Continue Zocor  40 mg daily  DCIS, breast cancer DCIS, breast cancer, with recent completion of radiation therapy.  Prednisone  was used for radiation-induced dermatitis, which has resolved. She is encouraged to monitor for any recurrence or new symptoms related to breast cancer. - Monitor for any recurrence or new symptoms related to breast cancer   Vitals Temp: 98 F (36.7 C) BP: 102/66 Pulse Rate: 64 SpO2: 98 %   Anthropometric Measurements Height: 5\' 3"  (1.6 m) Weight: 216 lb (98 kg) BMI (Calculated): 38.27 Weight at Last Visit: 217 lb Weight Lost Since Last Visit: 1 lb Weight Gained Since Last Visit: 0 Starting Weight: 251 lb Total Weight Loss (lbs): 35 lb (15.9 kg) Peak Weight: 280 lb   Body Composition  Body Fat %: 46.2 % Fat Mass (lbs): 99.8 lbs Muscle Mass (lbs): 110.6 lbs Total Body Water (lbs): 74.6 lbs Visceral Fat Rating : 15   Other Clinical Data Fasting: yes Labs: no Today's Visit #: 33 Starting Date: 05/20/21     ASSESSMENT AND PLAN:  Diet: Cindy Carey is currently in the action stage of change. As such, her goal is to continue with weight loss efforts. She has agreed to Category 1 Plan and Category 2  Plan.  Exercise: Cindy Carey has been instructed to work up to a goal of 150 minutes of combined cardio and strengthening exercise per week for weight loss and overall health benefits.   Behavior Modification:  We discussed the following Behavioral Modification Strategies today: increasing lean protein intake, decreasing simple carbohydrates, increasing vegetables, increase H2O intake, increase high fiber foods, no skipping meals, meal planning and cooking strategies, avoiding temptations, and planning for success. We discussed various medication options to help Cindy Carey with her weight loss efforts and we both agreed to continue current plan and continue to work on nutritional and behavioral strategies to promote weight loss.  .  Return in about 6 weeks (around 03/26/2024).Cindy Carey She was informed of the importance of frequent follow up visits to maximize her success with intensive  lifestyle modifications for her multiple health conditions.  Attestation Statements:   Reviewed by clinician on day of visit: allergies, medications, problem list, medical history, surgical history, family history, social history, and previous encounter notes.   Time spent on visit including pre-visit chart review and post-visit care and charting was 29 minutes.    Cindy Orlich, PA-C

## 2024-02-13 ENCOUNTER — Other Ambulatory Visit (HOSPITAL_COMMUNITY): Payer: Self-pay

## 2024-02-13 ENCOUNTER — Ambulatory Visit (INDEPENDENT_AMBULATORY_CARE_PROVIDER_SITE_OTHER): Admitting: Physician Assistant

## 2024-02-13 ENCOUNTER — Encounter (INDEPENDENT_AMBULATORY_CARE_PROVIDER_SITE_OTHER): Payer: Self-pay | Admitting: Physician Assistant

## 2024-02-13 ENCOUNTER — Other Ambulatory Visit: Payer: Self-pay | Admitting: Family Medicine

## 2024-02-13 ENCOUNTER — Other Ambulatory Visit: Payer: Self-pay

## 2024-02-13 VITALS — BP 102/66 | HR 64 | Temp 98.0°F | Ht 63.0 in | Wt 216.0 lb

## 2024-02-13 DIAGNOSIS — I152 Hypertension secondary to endocrine disorders: Secondary | ICD-10-CM

## 2024-02-13 DIAGNOSIS — E1169 Type 2 diabetes mellitus with other specified complication: Secondary | ICD-10-CM | POA: Diagnosis not present

## 2024-02-13 DIAGNOSIS — E1159 Type 2 diabetes mellitus with other circulatory complications: Secondary | ICD-10-CM | POA: Diagnosis not present

## 2024-02-13 DIAGNOSIS — Z7985 Long-term (current) use of injectable non-insulin antidiabetic drugs: Secondary | ICD-10-CM | POA: Diagnosis not present

## 2024-02-13 DIAGNOSIS — Z6838 Body mass index (BMI) 38.0-38.9, adult: Secondary | ICD-10-CM | POA: Diagnosis not present

## 2024-02-13 DIAGNOSIS — Z7984 Long term (current) use of oral hypoglycemic drugs: Secondary | ICD-10-CM

## 2024-02-13 DIAGNOSIS — E785 Hyperlipidemia, unspecified: Secondary | ICD-10-CM

## 2024-02-13 DIAGNOSIS — E669 Obesity, unspecified: Secondary | ICD-10-CM | POA: Diagnosis not present

## 2024-02-13 MED ORDER — EMPAGLIFLOZIN 25 MG PO TABS
25.0000 mg | ORAL_TABLET | Freq: Every day | ORAL | 0 refills | Status: DC
  Filled 2024-02-13: qty 90, 90d supply, fill #0

## 2024-02-13 MED ORDER — ONDANSETRON HCL 4 MG PO TABS
4.0000 mg | ORAL_TABLET | Freq: Three times a day (TID) | ORAL | 0 refills | Status: AC | PRN
  Filled 2024-02-13: qty 20, 7d supply, fill #0

## 2024-02-13 MED ORDER — TIRZEPATIDE 15 MG/0.5ML ~~LOC~~ SOAJ
15.0000 mg | SUBCUTANEOUS | 1 refills | Status: DC
Start: 2024-02-13 — End: 2024-08-21
  Filled 2024-02-13 – 2024-04-19 (×2): qty 6, 84d supply, fill #0
  Filled 2024-07-12: qty 6, 84d supply, fill #1

## 2024-02-14 ENCOUNTER — Other Ambulatory Visit: Payer: Self-pay

## 2024-02-14 ENCOUNTER — Other Ambulatory Visit (INDEPENDENT_AMBULATORY_CARE_PROVIDER_SITE_OTHER): Payer: Self-pay | Admitting: Physician Assistant

## 2024-02-14 ENCOUNTER — Other Ambulatory Visit (HOSPITAL_COMMUNITY): Payer: Self-pay

## 2024-02-14 ENCOUNTER — Encounter (HOSPITAL_COMMUNITY): Payer: Self-pay

## 2024-02-14 DIAGNOSIS — K219 Gastro-esophageal reflux disease without esophagitis: Secondary | ICD-10-CM

## 2024-02-17 DIAGNOSIS — H3500 Unspecified background retinopathy: Secondary | ICD-10-CM | POA: Diagnosis not present

## 2024-02-28 DIAGNOSIS — D0512 Intraductal carcinoma in situ of left breast: Secondary | ICD-10-CM | POA: Diagnosis not present

## 2024-03-02 DIAGNOSIS — H4311 Vitreous hemorrhage, right eye: Secondary | ICD-10-CM | POA: Diagnosis not present

## 2024-03-02 DIAGNOSIS — H43811 Vitreous degeneration, right eye: Secondary | ICD-10-CM | POA: Diagnosis not present

## 2024-03-04 ENCOUNTER — Other Ambulatory Visit: Payer: Self-pay | Admitting: Family Medicine

## 2024-03-04 DIAGNOSIS — I700 Atherosclerosis of aorta: Secondary | ICD-10-CM

## 2024-03-04 DIAGNOSIS — E1169 Type 2 diabetes mellitus with other specified complication: Secondary | ICD-10-CM

## 2024-03-05 ENCOUNTER — Other Ambulatory Visit: Payer: Self-pay | Admitting: Family Medicine

## 2024-03-05 ENCOUNTER — Other Ambulatory Visit (HOSPITAL_COMMUNITY): Payer: Self-pay

## 2024-03-05 MED ORDER — SIMVASTATIN 40 MG PO TABS
40.0000 mg | ORAL_TABLET | Freq: Every evening | ORAL | 0 refills | Status: DC
  Filled 2024-03-05: qty 90, 90d supply, fill #0

## 2024-03-05 MED ORDER — EPINEPHRINE 0.3 MG/0.3ML IJ SOAJ
0.3000 mg | Freq: Once | INTRAMUSCULAR | 0 refills | Status: DC | PRN
  Filled 2024-03-05: qty 2, 2d supply, fill #0

## 2024-03-20 DIAGNOSIS — L814 Other melanin hyperpigmentation: Secondary | ICD-10-CM | POA: Diagnosis not present

## 2024-03-20 DIAGNOSIS — L821 Other seborrheic keratosis: Secondary | ICD-10-CM | POA: Diagnosis not present

## 2024-03-20 DIAGNOSIS — D225 Melanocytic nevi of trunk: Secondary | ICD-10-CM | POA: Diagnosis not present

## 2024-03-20 NOTE — Progress Notes (Unsigned)
 No chief complaint on file.  Cindy Carey is a 68 y.o. female who presents for a complete physical.   She has the following concerns:  Since her last physical, she was diagnosed with DCIS in 09/2023, s/p lumpectomy in 10/2023, and radiation.  She developed radiation induced dermatitis which needed to be treated with prednisone  (saw Dr. Martina Sledge).  She started Tamoxifen  in April.  She had L breast intraductal papilloma with atypical ductal hyperplasia diagnosed 07/15/2015; status post lumpectomy 07/23/2015. Due to lifetime risk of breast cancer of 25%, she was treated with risk lowering therapy with Tamoxifen  for 5 years, completing in 08/2020.   We had seen her for a visit regarding insomnia in January, when she had a lot on her plate and couldn't sleep. She was prescribed zolpidem  5mg  at that time. ***UPDATE   GERD:  Patient with known hiatal hernia and issues with heartburn.  She had been taking Pepcid  20mg  at bedtime, and symptoms improved, needing much less Tums.  Uses Tums occasionally--usually preventatively if she eats very late.  Still has occasional heartburn, depends on what she eats (things with onions).  She last saw Dr. Tova Fresh in 01/2024 ***UPDATE--change in bowels and GERD?  Hypertension follow-up:  Blood pressures are running *** Compliant with lisinopril  5 mg daily. Denies dizziness, headaches, chest pain.   Denies side effects of medications.   She has leg cramps intermittently at night, related to which shoes she wore all day.  She also gets leg cramps with long car rides if they don't stop. Denies any recent changes/worsening.   BP Readings from Last 3 Encounters:  02/13/24 102/66  01/03/24 130/73  11/30/23 128/74   Migraines--She used to takes magnesium supplement, both for migraines and leg cramps.  She stopped taking Mg over a year ago and has done well (no changes to leg cramps, no migraines).  ***UPDATE  She uses ibuprofen at time of aura, phenergan  if that doesn't  work.   Gets migraines only 2-3x/year, triggered by stress, not eating.   (Stopped topiramate  in the past due to hair thinning. Improved after stopping, but improved much more after completing tamoxifen ). She uses flexeril  before bedtime when muscles are very tight, or if needed for R shoulder pain (1/2 tablet once a week), or else she won't sleep well.   She uses Tylenol  a few times/week before bedtime for pain (hip, knee, elbow).   Hyperlipidemia follow-up:  Patient is reportedly following a low-fat, low cholesterol diet. Compliant with medications (simvastatin  40mg ) and denies medication side effects. Lipids were at goal on last check, due for recheck.    Lab Results  Component Value Date   CHOL 127 03/01/2023   HDL 77 03/01/2023   LDLCALC 34 03/01/2023   TRIG 82 03/01/2023   CHOLHDL 2.0 08/03/2022   Diabetes:  She continues on Jardiance  (25mg  daily), metformin  (1000mg  BID). GLP's are managed by [facility]--had been on Ozempic , switched to Mounjaro , switched back to Ozempic  due to local skin reaction from Mounjaro .  Back on Mounjaro  (frustrated with plateau) for over a year, doing well on 15 mg dose with good weight loss results. Sugars are running ***  Last A1c was 5.5 in 07/2023. Denies hypoglycemia. Last eye exam: Jan 2025, no retinopathy. Checks feet regularly without concerns.   Non-obstructive CAD: Calcium score of 1 in 01/2018. She had CT calcium score repeated 02/27/21 (part of Doctor's Day):  Coronary calcium score of 36. This was 43 percentile for age-, race-, and sex-matched controls. Aortic arch  atherosclerosis noted, small focal area. She sees Dr. Theodis Fiscal, and is scheduled to see later this week. She is compliant with ASA 81mg  daily and statin. She denies chest pain, DOE, palpitations.   Mild thrombocytopenia. She denies bleeding or bruising. Lab Results  Component Value Date   WBC 5.9 07/21/2023   HGB 12.2 07/21/2023   HCT 40.1 07/21/2023   MCV 89 07/21/2023    PLT 106 (L) 07/21/2023     Immunization History  Administered Date(s) Administered   Influenza,inj,Quad PF,6+ Mos 07/09/2020, 07/30/2022   Influenza-Unspecified 06/27/2015, 07/03/2017, 07/05/2018, 07/19/2019, 07/20/2021   PFIZER Comirnaty Martina Sledge Top)Covid-19 Tri-Sucrose Vaccine 03/09/2021   PFIZER(Purple Top)SARS-COV-2 Vaccination 10/20/2019, 11/10/2019, 07/09/2020   PNEUMOCOCCAL CONJUGATE-20 03/16/2023   Pfizer Covid-19 Vaccine Bivalent Booster 65yrs & up 12/15/2021   Pfizer(Comirnaty )Fall Seasonal Vaccine 12 years and older 07/30/2022   Pneumococcal Polysaccharide-23 03/31/2016   Tdap 07/03/2015   Zoster Recombinant(Shingrix ) 12/07/2017, 02/10/2018   Last Pap smear:  per GYN, s/p hysterectomy. Scheduled to see GYN in 04/2024. Last mammogram: 09/2023 Last colonoscopy: 02/2018, hyperplastic polyp Dr. Tova Fresh Last DEXA:  08/2023 T-1.7 at L fem neck (prior 09/2017 T-1.3 at L fem neck) Dentist: twice yearly Ophtho: yearly Exercise:    She is going to the gym 3-4x/week, exercising 45 minutes. Treadmill, bicycle, and weight stations. She walks her dog daily.  Walk is slow, often she picks the dog up (8#), and will walk faster while holding the dog.  Vitamin D -OH 77.5 in 07/2023   PMH, PSH, SH and FH were reviewed and updated     ROS:  The patient denies anorexia, fever, vision changes, decreased hearing, ear pain, sore throat, breast concerns, chest pain, palpitations, dizziness, syncope, dyspnea on exertion, cough, swelling, vomiting, diarrhea, constipation, abdominal pain, melena, hematochezia, hematuria, incontinence, dysuria, vaginal bleeding, discharge, odor, genital lesions, numbness, tingling, weakness, tremor, suspicious skin lesions, depression, anxiety, abnormal bleeding/bruising, or enlarged lymph nodes.   R>L knee pain, depends on the shoes she wears. Worse with stairs/hills, not a problem when walking dog. Unchanged. Migraines are infrequent (2-3x/year) Reflux is controlled.  Denies dysphagia. Urinary incontinence with sneezing.  15# weight loss in the last year (intentional)   PHYSICAL EXAM:  LMP 10/18/1982   Wt Readings from Last 3 Encounters:  02/13/24 216 lb (98 kg)  01/03/24 217 lb (98.4 kg)  12/05/23 221 lb (100.2 kg)   03/16/23 231 lb 3.2 oz (104.9 kg)  03/10/22         236 lb 6.4 oz (107.2 kg)  03/09/21 259 lb 12.8 oz (117.8 kg)    General Appearance:    Alert, cooperative, no distress, appears stated age. She did not undress into gown for exam.   Head:    Normocephalic, without obvious abnormality, atraumatic  Eyes:    PERRL, conjunctiva/corneas clear, EOM's intact, fundi benign  Ears:    Normal TM's and external ear canals.   Nose:   No drainage, sinuses nontender  Throat:   Normal mucosa, no lesions  Neck:   Supple, no lymphadenopathy;  thyroid:  no enlargement/tenderness/ nodules; no carotid bruit or JVD  Back:    Spine nontender, no curvature, ROM normal, no CVA tenderness  Lungs:     Clear to auscultation bilaterally without wheezes, rales or ronchi; respirations unlabored  Chest Wall:    Nontender, no deformity   Heart:    Regular rate and rhythm, S1 and S2 normal, no murmur, rub or gallop  Breast Exam:    Deferred to GYN  Abdomen:  Soft, non-tender, nondistended, normoactive bowel sounds, no masses, no hepatosplenomegaly  Genitalia:    Deferred to GYN       Extremities:   No clubbing, cyanosis or edema. Normal diabetic foot exam.  Pulses:   2+ and symmetric all extremities  Skin:   Skin color, texture, turgor normal, no rashes or lesions. Exam is limited due to not changing into gown.  Lymph nodes:   Cervical, supraclavicular nodes normal  Neurologic:   Normal strength, sensation and gait; reflexes 2+ and symmetric throughout                               Psych:   Normal mood, affect, hygiene and grooming.   Diabetic foot exam performed, normal. ***   ASSESSMENT/PLAN:  DIABETIC FOOT EXAM  Need flu and COVID booster  dates, if she got COVID booster Did she get RSV vaccine?  When does she plan to retire? Thinking she is not yet on Medicare, but that will change when she retires.  A1c Cbc, lipids, c-met, TSH, urine microalb   Discussed monthly self breast exams and yearly mammograms; at least 30 minutes of aerobic activity at least 5 days/week, weight-bearing exercise; proper sunscreen use reviewed; healthy diet, including goals of calcium and vitamin D  intake and alcohol recommendations (less than or equal to 1 drink/day) reviewed; regular seatbelt use; changing batteries in smoke detectors.  Immunization recommendations discussed--continue yearly flu shots.  COVID boosters discussed. RSV in the Fall, from pharmacy. *** Colonoscopy recommendations reviewed, UTD   F/u 1 year for CPE Continue f/u with [facility]

## 2024-03-20 NOTE — Patient Instructions (Incomplete)
   HEALTH MAINTENANCE RECOMMENDATIONS:  It is recommended that you get at least 30 minutes of aerobic exercise at least 5 days/week (for weight loss, you may need as much as 60-90 minutes). This can be any activity that gets your heart rate up. This can be divided in 10-15 minute intervals if needed, but try and build up your endurance at least once a week.  Weight bearing exercise is also recommended twice weekly.  Eat a healthy diet with lots of vegetables, fruits and fiber.  "Colorful" foods have a lot of vitamins (ie green vegetables, tomatoes, red peppers, etc).  Limit sweet tea, regular sodas and alcoholic beverages, all of which has a lot of calories and sugar.  Up to 1 alcoholic drink daily may be beneficial for women (unless trying to lose weight, watch sugars).  Drink a lot of water.  Calcium recommendations are 1200-1500 mg daily (1500 mg for postmenopausal women or women without ovaries), and vitamin D 1000 IU daily.  This should be obtained from diet and/or supplements (vitamins), and calcium should not be taken all at once, but in divided doses.  Monthly self breast exams and yearly mammograms for women over the age of 109 is recommended.  Sunscreen of at least SPF 30 should be used on all sun-exposed parts of the skin when outside between the hours of 10 am and 4 pm (not just when at beach or pool, but even with exercise, golf, tennis, and yard work!)  Use a sunscreen that says "broad spectrum" so it covers both UVA and UVB rays, and make sure to reapply every 1-2 hours.  Remember to change the batteries in your smoke detectors when changing your clock times in the spring and fall. Carbon monoxide detectors are recommended for your home.  Use your seat belt every time you are in a car, and please drive safely and not be distracted with cell phones and texting while driving.

## 2024-03-21 ENCOUNTER — Encounter: Payer: Self-pay | Admitting: Family Medicine

## 2024-03-21 ENCOUNTER — Other Ambulatory Visit (HOSPITAL_COMMUNITY): Payer: Self-pay

## 2024-03-21 ENCOUNTER — Ambulatory Visit (INDEPENDENT_AMBULATORY_CARE_PROVIDER_SITE_OTHER): Payer: Commercial Managed Care - PPO | Admitting: Family Medicine

## 2024-03-21 VITALS — BP 122/76 | HR 64 | Ht 63.0 in | Wt 218.4 lb

## 2024-03-21 DIAGNOSIS — K219 Gastro-esophageal reflux disease without esophagitis: Secondary | ICD-10-CM | POA: Diagnosis not present

## 2024-03-21 DIAGNOSIS — Z0000 Encounter for general adult medical examination without abnormal findings: Secondary | ICD-10-CM | POA: Diagnosis not present

## 2024-03-21 DIAGNOSIS — E538 Deficiency of other specified B group vitamins: Secondary | ICD-10-CM | POA: Diagnosis not present

## 2024-03-21 DIAGNOSIS — E559 Vitamin D deficiency, unspecified: Secondary | ICD-10-CM | POA: Diagnosis not present

## 2024-03-21 DIAGNOSIS — D0512 Intraductal carcinoma in situ of left breast: Secondary | ICD-10-CM | POA: Diagnosis not present

## 2024-03-21 DIAGNOSIS — E1169 Type 2 diabetes mellitus with other specified complication: Secondary | ICD-10-CM | POA: Diagnosis not present

## 2024-03-21 DIAGNOSIS — M8580 Other specified disorders of bone density and structure, unspecified site: Secondary | ICD-10-CM | POA: Diagnosis not present

## 2024-03-21 DIAGNOSIS — I2510 Atherosclerotic heart disease of native coronary artery without angina pectoris: Secondary | ICD-10-CM | POA: Diagnosis not present

## 2024-03-21 DIAGNOSIS — I700 Atherosclerosis of aorta: Secondary | ICD-10-CM | POA: Diagnosis not present

## 2024-03-21 DIAGNOSIS — D696 Thrombocytopenia, unspecified: Secondary | ICD-10-CM

## 2024-03-21 DIAGNOSIS — E785 Hyperlipidemia, unspecified: Secondary | ICD-10-CM | POA: Diagnosis not present

## 2024-03-21 DIAGNOSIS — M85852 Other specified disorders of bone density and structure, left thigh: Secondary | ICD-10-CM

## 2024-03-21 DIAGNOSIS — E1159 Type 2 diabetes mellitus with other circulatory complications: Secondary | ICD-10-CM

## 2024-03-21 DIAGNOSIS — Z5181 Encounter for therapeutic drug level monitoring: Secondary | ICD-10-CM

## 2024-03-21 LAB — POCT GLYCOSYLATED HEMOGLOBIN (HGB A1C): Hemoglobin A1C: 5 % (ref 4.0–5.6)

## 2024-03-21 LAB — CBC WITH DIFFERENTIAL/PLATELET

## 2024-03-21 MED ORDER — METFORMIN HCL ER 500 MG PO TB24
500.0000 mg | ORAL_TABLET | Freq: Two times a day (BID) | ORAL | 1 refills | Status: DC
  Filled 2024-03-21 (×2): qty 180, 90d supply, fill #0
  Filled 2024-06-15: qty 180, 90d supply, fill #1

## 2024-03-22 ENCOUNTER — Ambulatory Visit: Payer: Self-pay | Admitting: Family Medicine

## 2024-03-22 ENCOUNTER — Other Ambulatory Visit (HOSPITAL_COMMUNITY): Payer: Self-pay

## 2024-03-22 ENCOUNTER — Encounter: Payer: Self-pay | Admitting: Family Medicine

## 2024-03-22 DIAGNOSIS — M85852 Other specified disorders of bone density and structure, left thigh: Secondary | ICD-10-CM | POA: Insufficient documentation

## 2024-03-22 LAB — CBC WITH DIFFERENTIAL/PLATELET
Basos: 0 %
EOS (ABSOLUTE): 0 10*3/uL (ref 0.0–0.2)
Eos: 2 %
Hematocrit: 39.8 % (ref 34.0–46.6)
Hemoglobin: 12.2 g/dL (ref 11.1–15.9)
Immature Granulocytes: 0 %
Immature Granulocytes: 0 10*3/uL (ref 0.0–0.1)
Lymphs: 23 %
MCH: 26.5 pg — ABNORMAL LOW (ref 26.6–33.0)
MCHC: 30.7 g/dL — ABNORMAL LOW (ref 31.5–35.7)
MCV: 87 fL (ref 79–97)
Monocytes Absolute: 0.1 10*3/uL (ref 0.0–0.4)
Monocytes Absolute: 0.6 10*3/uL (ref 0.1–0.9)
Monocytes: 10 %
Neutrophils Absolute: 1.4 10*3/uL (ref 0.7–3.1)
Neutrophils Absolute: 3.7 10*3/uL (ref 1.4–7.0)
Neutrophils: 65 %
Platelets: 90 10*3/uL — CL (ref 150–450)
RBC: 4.6 x10E6/uL (ref 3.77–5.28)
RDW: 16.7 % — ABNORMAL HIGH (ref 11.7–15.4)
WBC: 5.8 10*3/uL (ref 3.4–10.8)

## 2024-03-22 LAB — MICROALBUMIN / CREATININE URINE RATIO
Creatinine, Urine: 144.4 mg/dL
Microalb/Creat Ratio: 9 mg/g{creat} (ref 0–29)
Microalbumin, Urine: 12.4 ug/mL

## 2024-03-22 LAB — COMPREHENSIVE METABOLIC PANEL WITH GFR
ALT: 14 IU/L (ref 0–32)
AST: 23 IU/L (ref 0–40)
Albumin: 3.9 g/dL (ref 3.9–4.9)
Alkaline Phosphatase: 75 IU/L (ref 44–121)
BUN/Creatinine Ratio: 16 (ref 12–28)
BUN: 13 mg/dL (ref 8–27)
Bilirubin Total: 0.7 mg/dL (ref 0.0–1.2)
CO2: 19 mmol/L — ABNORMAL LOW (ref 20–29)
Calcium: 9.1 mg/dL (ref 8.7–10.3)
Chloride: 104 mmol/L (ref 96–106)
Creatinine, Ser: 0.83 mg/dL (ref 0.57–1.00)
Globulin, Total: 2.7 g/dL (ref 1.5–4.5)
Glucose: 82 mg/dL (ref 70–99)
Potassium: 4.5 mmol/L (ref 3.5–5.2)
Sodium: 138 mmol/L (ref 134–144)
Total Protein: 6.6 g/dL (ref 6.0–8.5)
eGFR: 78 mL/min/{1.73_m2} (ref >59)

## 2024-03-22 LAB — TSH: TSH: 1.2 u[IU]/mL (ref 0.450–4.500)

## 2024-03-22 LAB — LIPID PANEL
Chol/HDL Ratio: 2.1 ratio (ref 0.0–4.4)
Cholesterol, Total: 129 mg/dL (ref 100–199)
HDL: 62 mg/dL (ref >39)
LDL Chol Calc (NIH): 46 mg/dL (ref 0–99)
Triglycerides: 117 mg/dL (ref 0–149)
VLDL Cholesterol Cal: 21 mg/dL (ref 5–40)

## 2024-03-22 LAB — VITAMIN D 25 HYDROXY (VIT D DEFICIENCY, FRACTURES): Vit D, 25-Hydroxy: 68.4 ng/mL (ref 30.0–100.0)

## 2024-03-22 LAB — VITAMIN B12: Vitamin B-12: 736 pg/mL (ref 232–1245)

## 2024-03-22 NOTE — Assessment & Plan Note (Signed)
 Per DEXA 08/2023 T-1.7 at L fem neck. Discussed adequate calcium intake, vitamin D , weight-bearing exercise.

## 2024-03-22 NOTE — Assessment & Plan Note (Signed)
 Per coronary calcium scan in 02/2021 (calcium score 36, 74 percentile). Continue aspirin, statin, good control of DM

## 2024-03-22 NOTE — Assessment & Plan Note (Signed)
 Well controlled. Continue Mounjaro  15 mg (doing well with weight loss), Jardiance . Trial decreasing metformin  from 2000 mg daily to 1000 mg daily, as A1c was 5.0 today.

## 2024-03-22 NOTE — Assessment & Plan Note (Signed)
 Continue simvastatin.

## 2024-03-22 NOTE — Assessment & Plan Note (Signed)
 Known HH. Doing well on pepcid  BID

## 2024-03-22 NOTE — Assessment & Plan Note (Signed)
 LDL at goal, continue statin and low cholesterol diet

## 2024-03-22 NOTE — Assessment & Plan Note (Signed)
 Off supplements x 1 month. Recheck level. Discussed rec for [phone] IU daily (even if levels okay on today's check).

## 2024-03-22 NOTE — Assessment & Plan Note (Signed)
 Has been mild, due for recheck.  Denies bleeding. Cont to avoid NSAIDs

## 2024-03-22 NOTE — Assessment & Plan Note (Signed)
 Tolerating Tamoxifen  without side effects

## 2024-03-23 ENCOUNTER — Encounter (HOSPITAL_BASED_OUTPATIENT_CLINIC_OR_DEPARTMENT_OTHER): Payer: Self-pay | Admitting: Cardiovascular Disease

## 2024-03-23 ENCOUNTER — Ambulatory Visit (HOSPITAL_BASED_OUTPATIENT_CLINIC_OR_DEPARTMENT_OTHER): Payer: Commercial Managed Care - PPO | Admitting: Cardiovascular Disease

## 2024-03-23 VITALS — BP 110/62 | HR 69 | Ht 63.0 in | Wt 218.6 lb

## 2024-03-23 DIAGNOSIS — I700 Atherosclerosis of aorta: Secondary | ICD-10-CM

## 2024-03-23 DIAGNOSIS — I152 Hypertension secondary to endocrine disorders: Secondary | ICD-10-CM | POA: Diagnosis not present

## 2024-03-23 DIAGNOSIS — E1159 Type 2 diabetes mellitus with other circulatory complications: Secondary | ICD-10-CM | POA: Diagnosis not present

## 2024-03-23 DIAGNOSIS — E785 Hyperlipidemia, unspecified: Secondary | ICD-10-CM

## 2024-03-23 DIAGNOSIS — I2510 Atherosclerotic heart disease of native coronary artery without angina pectoris: Secondary | ICD-10-CM | POA: Diagnosis not present

## 2024-03-23 DIAGNOSIS — E1169 Type 2 diabetes mellitus with other specified complication: Secondary | ICD-10-CM | POA: Diagnosis not present

## 2024-03-23 NOTE — Progress Notes (Signed)
 Cardiology Office Note:  .   Date:  03/23/2024  ID:  Cindy Carey, Cindy Carey [birth year], MRN [id number] PCP: Cindy Colla, MD  [facility] Providers Cardiologist:  Maudine Sos, MD    History of Present Illness: .    Cindy Carey is a 68 y.o. female with non-obstructive CAD, hypertension, hyperlipidemia, diabetes and obesity here for follow up.  She was initially seen 07/2015 for evaluation of an abnormal EKG.  Cindy Carey was seen by anesthesia for pre-operative assessment was noted to have an incomplete bundle branch block.  Given that she has a strong family history of heart disease, she requested follow up with cardiology.  At that time she was feeling well and had no additional testing.  She followed up with Cindy Jetty, DNP, on 12/2017 for pre-operative risk assessment prior to bariatric surgery.  Her mobility was limited due to knee pain.  She was referred for cardiac CT-A 01/18/18 that revealed a small amount of calcified plaque in the LAD but no obstructive CAD.  Her coronary calcium score was 1, which was 64th percentile. She had a repeat calcium score 02/2021 which was 36, 74th percentile.     Discussed the use of AI scribe software for clinical note transcription with the patient, who gave verbal consent to proceed.  History of Present Illness Cindy Carey engages in regular physical activity, walking at least four days a week, often more on weekends[place] She carries her small dog during these walks, adding extra weight. No issues with breathing, chest pain, or leg pain during these activities.  No swelling in her legs or feet, shortness of breath when lying down, or dizziness. Her blood pressure at home is consistently around 100/60 to 110/62, and she has not experienced dizziness since her lisinopril  dose was reduced to 5 mg a few months ago.  She is currently taking Mounjaro , Jardiance , baby aspirin, and simvastatin . Her most  recent cholesterol levels were: total cholesterol 129, triglycerides 117, HDL 62, and LDL 46.  She experiences leg cramps at night but ensures she stays hydrated, consuming approximately 100 ounces of water daily.  ROS:  As per HPI  Studies Reviewed: [name]       Calcium score 02/27/21: IMPRESSION: Coronary calcium score of 36. This was 4 percentile for age-, race-, and sex-matched controls.  Risk Assessment/Calculations:             Physical Exam:   VS:  BP 110/62 (BP Location: Left Arm, Patient Position: Sitting, Cuff Size: Large)   Pulse 69   Ht 5\' 3"  (1.6 m)   Wt 218 lb 9.6 oz (99.2 kg)   LMP 10/18/1982   BMI 38.72 kg/m  , BMI Body mass index is 38.72 kg/m. GENERAL:  Well appearing HEENT: Pupils equal round and reactive, fundi not visualized, oral mucosa unremarkable NECK:  No jugular venous distention, waveform within normal limits, carotid upstroke brisk and symmetric, no bruits, no thyromegaly LUNGS:  Clear to auscultation bilaterally HEART:  RRR.  PMI not displaced or sustained,S1 and S2 within normal limits, no S3, no S4, no clicks, no rubs, no murmurs ABD:  Flat, positive bowel sounds normal in frequency in pitch, no bruits, no rebound, no guarding, no midline pulsatile mass, no hepatomegaly, no splenomegaly EXT:  2 plus pulses throughout, no edema, no cyanosis no clubbing SKIN:  No rashes no nodules NEURO:  Cranial nerves II through XII grossly intact, motor grossly intact throughout PSYCH:  Cognitively  intact, oriented to person place and time   ASSESSMENT AND PLAN: .    Assessment & Plan # Hypertension Hypertension well-controlled with current medication. Blood pressure stable at 110/62 mmHg. - Continue lisinopril  5 mg daily.  # Hyperlipidemia Hyperlipidemia well-managed with simvastatin . Cholesterol panel indicates excellent control. - Continue simvastatin  as prescribed. - Recheck lipid panel in one year.  # Type 2 Diabetes Mellitus Type 2 Diabetes  Mellitus managed with Mounjaro  and Jardiance .  A1c 5.0 %. - Continue Mounjaro  and Jardiance  as prescribed. - Monitor blood glucose levels regularly.   Dispo: f/u 1 year  Signed, Maudine Sos, MD

## 2024-03-23 NOTE — Patient Instructions (Signed)
 Medication Instructions:  Your physician recommends that you continue on your current medications as directed. Please refer to the Current Medication list given to you today.  *If you need a refill on your cardiac medications before your next appointment, please call your pharmacy*  Lab Work: NONE  Testing/Procedures: NONE  Follow-Up: At [facility], you and your health needs are our priority.  As part of our continuing mission to provide you with exceptional heart care, we have created designated Provider Care Teams.  These Care Teams include your primary Cardiologist (physician) and Advanced Practice Providers (APPs -  Physician Assistants and Nurse Practitioners) who all work together to provide you with the care you need, when you need it.  We recommend signing up for the patient portal called "MyChart".  Sign up information is provided on this After Visit Summary.  MyChart is used to connect with patients for Virtual Visits (Telemedicine).  Patients are able to view lab/test results, encounter notes, upcoming appointments, etc.  Non-urgent messages can be sent to your provider as well.   To learn more about what you can do with MyChart, go to [email or web address].    Your next appointment:   12 month(s)  The format for your next appointment:   In Person  Provider:   Dr Theodis Fiscal, Creed Dodrill NP, or Charlton Cooler NP

## 2024-03-30 ENCOUNTER — Other Ambulatory Visit (HOSPITAL_COMMUNITY): Payer: Self-pay

## 2024-04-02 ENCOUNTER — Encounter (INDEPENDENT_AMBULATORY_CARE_PROVIDER_SITE_OTHER): Payer: Self-pay | Admitting: Physician Assistant

## 2024-04-04 ENCOUNTER — Ambulatory Visit (INDEPENDENT_AMBULATORY_CARE_PROVIDER_SITE_OTHER): Admitting: Physician Assistant

## 2024-04-06 ENCOUNTER — Other Ambulatory Visit: Payer: Self-pay | Admitting: Family Medicine

## 2024-04-06 ENCOUNTER — Other Ambulatory Visit: Payer: Self-pay

## 2024-04-06 MED ORDER — FREESTYLE LITE TEST VI STRP
1.0000 | ORAL_STRIP | Freq: Two times a day (BID) | 2 refills | Status: DC
  Filled 2024-04-06: qty 100, 50d supply, fill #0
  Filled 2024-05-21: qty 100, 50d supply, fill #1
  Filled 2024-07-12: qty 100, 50d supply, fill #2

## 2024-04-13 ENCOUNTER — Other Ambulatory Visit (INDEPENDENT_AMBULATORY_CARE_PROVIDER_SITE_OTHER): Payer: Self-pay | Admitting: Physician Assistant

## 2024-04-13 DIAGNOSIS — I152 Hypertension secondary to endocrine disorders: Secondary | ICD-10-CM

## 2024-04-17 ENCOUNTER — Other Ambulatory Visit (HOSPITAL_COMMUNITY): Payer: Self-pay

## 2024-04-17 ENCOUNTER — Other Ambulatory Visit: Payer: Self-pay

## 2024-04-17 MED ORDER — LISINOPRIL 5 MG PO TABS
5.0000 mg | ORAL_TABLET | Freq: Every day | ORAL | 0 refills | Status: DC
  Filled 2024-04-17: qty 90, 90d supply, fill #0

## 2024-04-17 NOTE — Telephone Encounter (Signed)
 LAST APPOINTMENT DATE: 02/13/24 NEXT APPOINTMENT DATE: 05/02/24   [place] [facility] [address] [place] [address] Phone: [phone] Fax: [phone]  Patient is requesting a refill of the following medications: Requested Prescriptions   Pending Prescriptions Disp Refills   lisinopril  (ZESTRIL ) 5 MG tablet 90 tablet 0    Sig: Take 1 tablet (5 mg total) by mouth daily.    Date last filled: 01/17/24 Previously prescribed by S. Rayburn  Lab Results  Component Value Date   HGBA1C 5.0 03/21/2024   HGBA1C 5.5 07/21/2023   HGBA1C 5.4 03/01/2023   Lab Results  Component Value Date   MICROALBUR 0.3 03/31/2016   LDLCALC 46 03/21/2024   CREATININE 0.83 03/21/2024   Lab Results  Component Value Date   VD25OH 68.4 03/21/2024   VD25OH 77.5 07/21/2023   VD25OH 71.3 03/01/2023    BP Readings from Last 3 Encounters:  03/23/24 110/62  03/21/24 122/76  02/13/24 102/66

## 2024-04-18 ENCOUNTER — Other Ambulatory Visit: Payer: Self-pay | Admitting: Medical Genetics

## 2024-04-19 ENCOUNTER — Other Ambulatory Visit: Payer: Self-pay

## 2024-04-24 NOTE — Progress Notes (Unsigned)
 Start time: End time:  Virtual Visit via Video Note  I connected with Cindy Carey on 04/24/24 by a video enabled telemedicine application and verified that I am speaking with the correct person using two identifiers.  Location: Patient: *** Provider: office   I discussed the limitations of evaluation and management by telemedicine and the availability of in person appointments. The patient expressed understanding and agreed to proceed.  History of Present Illness:  No chief complaint on file.  Patient is requesting to transfer care back [facility]. They have been managing her GLPs--initially started on Ozempic , switched to Mounjaro  (to provide better weight loss benefit). She had switched back to Ozempic  after a local skin reaction from Mounjaro , but went back to Mounjaro  after being frustrated with plateau on Ozempic . She has been back on Mounjaro  for over a year, and doing well on the 15 mg dose without side effects, no skin reactions, and good weight loss.  Her diabetes has also been well controlled, with an A1c of 5.0 in 03/2024.  Exercise includes walking at least 4 days/week for 30 minutes, carrying 8# dog for part of the walk. No longer going to the gym.     PMH, PSH, SH reviewed    ROS:    Observations/Objective:  LMP 10/18/1982  Wt Readings from Last 3 Encounters:  03/23/24 218 lb 9.6 oz (99.2 kg)  03/21/24 218 lb 6.4 oz (99.1 kg)  02/13/24 216 lb (98 kg)      Assessment and Plan:   Has appt with [facility] 7/16   Follow Up Instructions:    I discussed the assessment and treatment plan with the patient. The patient was provided an opportunity to ask questions and all were answered. The patient agreed with the plan and demonstrated an understanding of the instructions.   The patient was advised to call back or seek an in-person evaluation if the symptoms worsen or if the condition fails to improve as anticipated.  I  spent *** minutes dedicated to the care of this patient, including pre-visit review of records, face to face time, post-visit ordering of testing and documentation.    Annabelle DELENA Fetters, MD

## 2024-04-25 ENCOUNTER — Telehealth (INDEPENDENT_AMBULATORY_CARE_PROVIDER_SITE_OTHER): Admitting: Family Medicine

## 2024-04-25 ENCOUNTER — Encounter: Payer: Self-pay | Admitting: Family Medicine

## 2024-04-25 ENCOUNTER — Other Ambulatory Visit: Payer: Self-pay

## 2024-04-25 VITALS — BP 104/60 | HR 70 | Ht 63.0 in | Wt 215.0 lb

## 2024-04-25 DIAGNOSIS — E1169 Type 2 diabetes mellitus with other specified complication: Secondary | ICD-10-CM

## 2024-04-25 NOTE — Patient Instructions (Signed)
 Send MyChart messages to us  when refills are needed for lisinopril  and Mounjaro  (so that the requests don't go to [place] and get denied).  Schedule a med check appointment for December. Return sooner if you have any issues, concerns, or struggles with the weight.  Try and stick to what you have heard repetitively--about protein, diet, portions, etc.

## 2024-04-30 NOTE — Progress Notes (Unsigned)
   Cindy Carey 17-May-1956 [id number]   History:  68 y.o. G0 presents for annual exam. Postmenopausal - no HRT. S/P TAH BSO at age 34 for severe endometriosis. Normal pap history.  Diagnosed with left breast cancer December 2024, managed with lumpectomy, radiation and currently on Tamoxifen . Prior H/O left breast lumpectomy in 2010/2016 - atypical ductal hyperplasia. Was on Tamoxifen  for 5 years. H/O diabetes.   Gynecologic History Patient's last menstrual period was 10/18/1982.   Contraception/Family planning: post menopausal status Sexually active:  No  Health Maintenance Last Pap: 2011 Last mammogram: 09/06/2023. Results were: Possible left breast mass, biopsy Last colonoscopy: 02/20/2018. Results were: Benign polyp Last Dexa: 08/23/2023. Results were: T-score -1.7  Past medical history, past surgical history, family history and social history were all reviewed and documented in the EPIC chart. Married. 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   ROS:  A ROS was performed and pertinent positives and negatives are included.  Exam:  There were no vitals filed for this visit.  There is no height or weight on file to calculate BMI.  General appearance:  Normal Thyroid:  Symmetrical, normal in size, without palpable masses or nodularity. Respiratory  Auscultation:  Clear without wheezing or rhonchi Cardiovascular  Auscultation:  Regular rate, without rubs, murmurs or gallops  Edema/varicosities:  Not grossly evident Abdominal  Soft,nontender, without masses, guarding or rebound.  Liver/spleen:  No organomegaly noted  Hernia:  None appreciated  Skin  Inspection:  Grossly normal Breasts: Examined lying and sitting.   Right: Without masses, retractions, nipple discharge or axillary adenopathy.   Left: Without masses, retractions, nipple discharge or axillary adenopathy. Pelvic: External genitalia:  no lesions              Urethra:  normal appearing urethra with no masses, tenderness or lesions               Bartholins and Skenes: normal                 Vagina: normal appearing vagina with normal color and discharge, no lesions              Cervix: absent Bimanual Exam:  Uterus:  absent              Adnexa: no mass, fullness, tenderness              Rectovaginal: Deferred              Anus:  normal, no lesions  Assessment/Plan:  68 y.o. G0 for annual exam.   Well female exam with routine gynecological exam - Education provided on SBEs, importance of preventative screenings, current guidelines, high calcium diet, regular exercise, and multivitamin daily. Labs with PCP.   Postmenopausal - no HRT. S/P TAH BSO at age 11 for severe endometriosis.   Osteopenia of neck of left femur - 08/2023 T-score -1.7 without elevated FRAX.   Screening for cervical cancer - Normal Pap history.  No longer screening for guidelines.   Screening for colon cancer - 2019 colonoscopy. Will repeat at 10-year interval per GI's recommendation.   No follow-ups on file.    Annabella DELENA Shutter DNP, 1:08 PM 04/30/2024

## 2024-05-01 ENCOUNTER — Encounter: Payer: Self-pay | Admitting: Nurse Practitioner

## 2024-05-01 ENCOUNTER — Ambulatory Visit (INDEPENDENT_AMBULATORY_CARE_PROVIDER_SITE_OTHER): Admitting: Nurse Practitioner

## 2024-05-01 VITALS — BP 112/60 | HR 72 | Ht 62.5 in | Wt 220.0 lb

## 2024-05-01 DIAGNOSIS — M8589 Other specified disorders of bone density and structure, multiple sites: Secondary | ICD-10-CM

## 2024-05-01 DIAGNOSIS — Z1331 Encounter for screening for depression: Secondary | ICD-10-CM | POA: Diagnosis not present

## 2024-05-01 DIAGNOSIS — Z01419 Encounter for gynecological examination (general) (routine) without abnormal findings: Secondary | ICD-10-CM | POA: Diagnosis not present

## 2024-05-01 DIAGNOSIS — Z780 Asymptomatic menopausal state: Secondary | ICD-10-CM | POA: Diagnosis not present

## 2024-05-01 DIAGNOSIS — D0512 Intraductal carcinoma in situ of left breast: Secondary | ICD-10-CM

## 2024-05-01 NOTE — Patient Instructions (Signed)
 EXERCISE   We recommended that you start or continue a regular exercise program for good health. Physical activity is anything that gets your body moving, some is better than none. The CDC recommends 150 minutes per week of Moderate-Intensity Aerobic Activity and 2 or more days of Muscle Strengthening Activity.   Benefits of exercise are limitless: helps weight loss/weight maintenance, improves mood and energy, helps with depression and anxiety, improves sleep, tones and strengthens muscles, improves balance, improves bone density, protects from chronic conditions such as heart disease, high blood pressure and diabetes and so much more. To learn more visit: http://kirby-bean.org/   DIET: Good nutrition starts with a healthy diet of fruits, vegetables, whole grains, and lean protein sources. Drink plenty of water for hydration. Minimize empty calories, sodium, sweets. For more information about dietary recommendations visit: CriticalGas.be and https://www.carpenter-henry.info/   ALCOHOL :  Women should limit their alcohol  intake to no more than 7 drinks/beers/glasses of wine (combined, not each!) per week. Moderation of alcohol  intake to this level decreases your risk of breast cancer and liver damage.  If you are concerned that you may have a problem, or your friends have told you they are concerned about your drinking, there are many resources to help. A well-known program that is free, effective, and available to all people all over the nation is Alcoholics Anonymous.  Check out this site to learn more: BeverageBargains.co.za   CALCIUM  AND VITAMIN D :  Adequate intake of calcium  and Vitamin D  are recommended for bone health.  You should be getting between 1000-1200 mg of calcium  and 800 units of Vitamin D  daily between diet and supplements   MAMMOGRAMS:  All women over 23 years old should have a routine mammogram.    COLON CANCER  SCREENING: Now recommend starting at age 37. At this time colonoscopy is not covered for routine screening until 50. There are take home tests that can be done between 45-49.    COLONOSCOPY:  Colonoscopy to screen for colon cancer is recommended for all women at age 32.  We know, you hate the idea of the prep.  We agree, BUT, having colon cancer and not knowing it is worse!!  Colon cancer so often starts as a polyp that can be seen and removed at colonscopy, which can quite literally save your life!  And if your first colonoscopy is normal and you have no family history of colon cancer, most women don't have to have it again for 10 years.  Once every ten years, you can do something that may end up saving your life, right?  We will be happy to help you get it scheduled when you are ready.  Be sure to check your insurance coverage so you understand how much it will cost.  It may be covered as a preventative service at no cost, but you should check your particular policy.

## 2024-05-02 ENCOUNTER — Encounter (INDEPENDENT_AMBULATORY_CARE_PROVIDER_SITE_OTHER): Payer: Self-pay

## 2024-05-02 ENCOUNTER — Ambulatory Visit (INDEPENDENT_AMBULATORY_CARE_PROVIDER_SITE_OTHER): Admitting: Family Medicine

## 2024-05-08 ENCOUNTER — Other Ambulatory Visit

## 2024-05-08 DIAGNOSIS — Z006 Encounter for examination for normal comparison and control in clinical research program: Secondary | ICD-10-CM

## 2024-05-11 ENCOUNTER — Inpatient Hospital Stay: Payer: Commercial Managed Care - PPO | Attending: Adult Health | Admitting: Adult Health

## 2024-05-11 ENCOUNTER — Encounter: Payer: Self-pay | Admitting: Adult Health

## 2024-05-11 VITALS — BP 113/45 | HR 77 | Temp 98.2°F | Resp 18 | Ht 62.5 in | Wt 218.1 lb

## 2024-05-11 DIAGNOSIS — Z90710 Acquired absence of both cervix and uterus: Secondary | ICD-10-CM | POA: Diagnosis not present

## 2024-05-11 DIAGNOSIS — E119 Type 2 diabetes mellitus without complications: Secondary | ICD-10-CM | POA: Insufficient documentation

## 2024-05-11 DIAGNOSIS — M8580 Other specified disorders of bone density and structure, unspecified site: Secondary | ICD-10-CM | POA: Insufficient documentation

## 2024-05-11 DIAGNOSIS — Z7984 Long term (current) use of oral hypoglycemic drugs: Secondary | ICD-10-CM | POA: Insufficient documentation

## 2024-05-11 DIAGNOSIS — Z801 Family history of malignant neoplasm of trachea, bronchus and lung: Secondary | ICD-10-CM | POA: Insufficient documentation

## 2024-05-11 DIAGNOSIS — D0512 Intraductal carcinoma in situ of left breast: Secondary | ICD-10-CM | POA: Insufficient documentation

## 2024-05-11 DIAGNOSIS — Z803 Family history of malignant neoplasm of breast: Secondary | ICD-10-CM | POA: Insufficient documentation

## 2024-05-11 DIAGNOSIS — Z90722 Acquired absence of ovaries, bilateral: Secondary | ICD-10-CM | POA: Diagnosis not present

## 2024-05-11 DIAGNOSIS — Z79810 Long term (current) use of selective estrogen receptor modulators (SERMs): Secondary | ICD-10-CM | POA: Insufficient documentation

## 2024-05-11 NOTE — Progress Notes (Signed)
 SURVIVORSHIP VISIT:  BRIEF ONCOLOGIC HISTORY:  Oncology History  Ductal carcinoma in situ (DCIS) of left breast  10/05/2023 Initial Diagnosis   Ductal carcinoma in situ (DCIS) of left breast   11/18/2023 Cancer Staging   Staging form: Breast, AJCC 8th Edition - Clinical stage from 11/18/2023: Stage 0 (cTis (DCIS), cN0, cM0, G2, PR-) - Signed by Loretha Ash, MD on 11/18/2023 Stage prefix: Initial diagnosis Histologic grading system: 3 grade system   12/05/2023 - 12/30/2023 Radiation Therapy   Plan Name: Breast_L_BH Site: Breast, Left Technique: 3D Mode: Photon Dose Per Fraction: 2.66 Gy Prescribed Dose (Delivered / Prescribed): 42.56 Gy / 42.56 Gy Prescribed Fxs (Delivered / Prescribed): 16 / 16   Plan Name: Brst_L_Bst_BH Site: Breast, Left Technique: 3D Mode: Photon Dose Per Fraction: 2 Gy Prescribed Dose (Delivered / Prescribed): 8 Gy / 8 Gy Prescribed Fxs (Delivered / Prescribed): 4 / 4   11/2023 -  Anti-estrogen oral therapy   20 mg Tamoxifen      INTERVAL HISTORY:  Ms. Armenteros to review her survivorship care plan detailing her treatment course for breast cancer, as well as monitoring long-term side effects of that treatment, education regarding health maintenance, screening, and overall wellness and health promotion.     Overall, Ms. Bryand reports feeling quite well.  She is taking tamoxifen  daily and tolerating it well.    REVIEW OF SYSTEMS:  Review of Systems  Constitutional:  Negative for appetite change, chills, fatigue, fever and unexpected weight change.  HENT:   Negative for hearing loss, lump/mass and trouble swallowing.   Eyes:  Negative for eye problems and icterus.  Respiratory:  Negative for chest tightness, cough and shortness of breath.   Cardiovascular:  Negative for chest pain, leg swelling and palpitations.  Gastrointestinal:  Negative for abdominal distention, abdominal pain, constipation, diarrhea, nausea and vomiting.  Endocrine:  Negative for hot flashes.  Genitourinary:  Negative for difficulty urinating.   Musculoskeletal:  Negative for arthralgias.  Skin:  Negative for itching and rash.  Neurological:  Negative for dizziness, extremity weakness, headaches and numbness.  Hematological:  Negative for adenopathy. Does not bruise/bleed easily.  Psychiatric/Behavioral:  Negative for depression. The patient is not nervous/anxious.    Breast: Denies any new nodularity, masses, tenderness, nipple changes, or nipple discharge.       PAST MEDICAL/SURGICAL HISTORY:  Past Medical History:  Diagnosis Date   Anemia    Asthma    Atypical ductal hyperplasia of left breast    Back pain    Blood transfusion without reported diagnosis 1983   CAD (coronary artery disease)    Diabetes mellitus without complication (HCC) 2010   T2DM   Dyspareunia    Exercise-induced asthma    worse in winter   Food allergy    H/O bone density study 2013   H/O cold sores    H/O colonoscopy 2009   History of endometriosis    Hyperlipidemia    Hypertension    Injury of tendon of rotator cuff 2015   gym injury (right)   Joint pain    Knee pain 08/2015   L>R   Migraine with typical aura    Mild CAD 04/04/2018   Mild disease on coronary CT-A 01/2018.   Morbid obesity (HCC) 09/04/2012   BMI 49.4 kg/m^2    Osteoarthritis    Plantar fasciitis, left 2019   PONV (postoperative nausea and vomiting)    Trigger finger of left thumb 08/2018   injected by Dr. Murrell 09/18/18   Past  Surgical History:  Procedure Laterality Date   BREAST BIOPSY  2010   BREAST BIOPSY Left 09/28/2023   US  LT BREAST BX W LOC DEV 1ST LESION IMG BX SPEC US  GUIDE 09/28/2023 [facility]   BREAST BIOPSY  11/01/2023   MM LT RADIOACTIVE SEED LOC MAMMO GUIDE 11/01/2023 [facility]   BREAST LUMPECTOMY  2010   Benign   BREAST LUMPECTOMY WITH RADIOACTIVE SEED LOCALIZATION Left 07/23/2015   Procedure: LEFT BREAST SEED GUIDED EXCISION;  Surgeon: Donnice Bury, MD;  Location: [facility];  Service: General;  Laterality: Left;   BREAST LUMPECTOMY WITH RADIOACTIVE SEED LOCALIZATION Left 11/02/2023   Procedure: RADIOACTIVE SEED GUIDED LEFT BREAST LUMPECTOMY;  Surgeon: Vernetta Berg, MD;  Location: [facility];  Service: General;  Laterality: Left;  LMA   BREATH TEK H PYLORI  08/28/2012   Procedure: BREATH TEK H PYLORI;  Surgeon: Donnice KATHEE Lunger, MD;  Location: [facility];  Service: General;  Laterality: N/A;   CHOLECYSTECTOMY  2007   EXPLORATORY LAPAROTOMY  1983   endometriosis   GANGLION CYST EXCISION Left 1999   Foot   TOTAL ABDOMINAL HYSTERECTOMY W/ BILATERAL SALPINGOOPHORECTOMY  1984   endometriosis (and appendectomy)     ALLERGIES:  Allergies  Allergen Reactions   Bee Venom Anaphylaxis   Shellfish Allergy Anaphylaxis   Invokana  [Canagliflozin ] Itching   Sulfamethoxazole -Trimethoprim  Other (See Comments)   Penicillins Hives     CURRENT MEDICATIONS:  Outpatient Encounter Medications as of 05/11/2024  Medication Sig Note   aspirin EC 81 MG tablet Take 81 mg by mouth daily.    Blood Glucose Monitoring Suppl (BLOOD GLUCOSE MONITOR SYSTEM) w/Device KIT Use to check blood sugar as needed as directed    Calcium Carb-Cholecalciferol (CALCIUM 600 + D PO) Calcium 600 + D    Cholecalciferol (VITAMIN D3) 125 MCG (5000 UT) CAPS Take 1 capsule by mouth daily. 03/21/2024: Stopped a month ago   cyclobenzaprine  (FLEXERIL ) 10 MG tablet Take 1 tablet (10 mg total) by mouth 3 (three) times daily as needed for muscle spasms. 03/21/2024: As needed   empagliflozin  (JARDIANCE ) 25 MG TABS tablet Take 1 tablet (25 mg total) by mouth daily with breakfast.    EPINEPHrine  0.3 mg/0.3 mL IJ SOAJ injection Inject 0.3 mg into the muscle once as needed for anaphylaxis.    EPINEPHrine  0.3 mg/0.3 mL IJ SOAJ injection Inject 0.3 mg into the muscle.    glucose blood (FREESTYLE LITE) test strip Test  2 (two) times daily.    Lancets  (FREESTYLE) lancets 1 each by Other route 2 (two) times daily. Test twice daily Dx:E11.9    lisinopril  (ZESTRIL ) 5 MG tablet Take 1 tablet (5 mg total) by mouth daily.    Menatetrenone (VITAMIN K2) 100 MCG TABS     metFORMIN  (GLUCOPHAGE -XR) 500 MG 24 hr tablet Take 1 tablet (500 mg total) by mouth 2 (two) times daily.    ondansetron  (ZOFRAN ) 4 MG tablet Take 1 tablet (4 mg total) by mouth every 8 (eight) hours as needed for nausea or vomiting. 03/21/2024: As needed   promethazine  (PHENERGAN ) 25 MG tablet Take 1 tablet (25 mg total) by mouth every 8 (eight) hours as needed for nausea or vomiting. 03/21/2024: As needed   simvastatin  (ZOCOR ) 40 MG tablet Take 1 tablet (40 mg total) by mouth every evening.    tamoxifen  (NOLVADEX ) 20 MG tablet Take 1 tablet (20 mg total) by mouth daily. 03/21/2024: Started 01/29/24   tirzepatide  (MOUNJARO ) 15 MG/0.5ML Pen Inject  15 mg into the skin once a week. 03/21/2024: Takes on Sunday   No facility-administered encounter medications on file as of 05/11/2024.     ONCOLOGIC FAMILY HISTORY:  Family History  Problem Relation Age of Onset   Thyroid disease Mother        hypothyroidism   Heart disease Mother 84       CABG at 84   Hypertension Mother    Diverticulitis Mother 80       perforation, required surgery   Arthritis Mother    Diabetes Father    Heart attack Father 55   Heart disease Father    Hypertension Father    Peripheral Artery Disease Father        stents at 84, another at 88   Pneumonia Sister    Osteoporosis Maternal Grandmother    Heart failure Maternal Grandmother    Lung cancer Maternal Grandfather    Cancer Maternal Grandfather    Dementia Paternal Grandmother    Melanoma Paternal Grandmother        melanoma on her foot (not related to death)   Cancer Paternal Grandmother    Stroke Paternal Grandmother    Diabetes Paternal Grandfather    Heart disease Paternal Grandfather    Stroke Paternal Grandfather    Lung cancer Maternal Aunt     Cancer Maternal Aunt        brain   Heart failure Maternal Uncle    Lung cancer Maternal Uncle    Cancer Maternal Uncle    Congestive Heart Failure Maternal Uncle    Breast cancer Paternal Aunt        40 's   Breast cancer Cousin        mets to lung   Congestive Heart Failure Cousin        related to chemo from breast cancer   Breast cancer Cousin      SOCIAL HISTORY:  Social History   Socioeconomic History   Marital status: Married    Spouse name: Tamiah Dysart   Number of children: Not on file   Years of education: Not on file   Highest education level: Master's degree (e.g., MA, MS, MEng, MEd, MSW, MBA)  Occupational History   Occupation: Publishing rights manager    Comment: [employer]  Tobacco Use   Smoking status: Never   Smokeless tobacco: Never  Vaping Use   Vaping status: Never Used  Substance and Sexual Activity   Alcohol use: No   Drug use: No   Sexual activity: Yes    Partners: Male    Birth control/protection: Surgical  Other Topics Concern   Not on file  Social History Narrative   Married, no children. 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   May retire spring 2026.      Updated 03/2024   Social Drivers of Health   Financial Resource Strain: Low Risk  (04/23/2024)   Overall Financial Resource Strain (CARDIA)    Difficulty of Paying Living Expenses: Not hard at all  Food Insecurity: No Food Insecurity (04/23/2024)   Hunger Vital Sign    Worried About Running Out of Food in the Last Year: Never true    Ran Out of Food in the Last Year: Never true  Transportation Needs: No Transportation Needs (04/23/2024)   PRAPARE - Transportation    Lack of  Transportation (Medical): No    Lack of Transportation (Non-Medical): No  Physical Activity: Insufficiently Active (04/23/2024)   Exercise Vital Sign    Days of Exercise  per Week: 4 days    Minutes of Exercise per Session: 30 min  Stress: No Stress Concern Present (04/23/2024)   [employer] of Occupational Health - Occupational Stress Questionnaire    Feeling of Stress: Not at all  Social Connections: Moderately Integrated (04/23/2024)   Social Connection and Isolation Panel    Frequency of Communication with Friends and Family: More than three times a week    Frequency of Social Gatherings with Friends and Family: Twice a week    Attends Religious Services: More than 4 times per year    Active Member of [employer] or Organizations: No    Attends Engineer, structural: Not on file    Marital Status: Married  Catering manager Violence: Not At Risk (03/21/2024)   Humiliation, Afraid, Rape, and Kick questionnaire    Fear of Current or Ex-Partner: No    Emotionally Abused: No    Physically Abused: No    Sexually Abused: No     OBSERVATIONS/OBJECTIVE:  BP (!) 113/45 (BP Location: Left Arm, Patient Position: Sitting) Comment: 116/50  Pulse 77   Temp 98.2 F (36.8 C) (Tympanic)   Resp 18   Ht 5' 2.5 (1.588 m)   Wt 218 lb 1.6 oz (98.9 kg)   LMP 10/18/1982   SpO2 95%   BMI 39.26 kg/m  GENERAL: Patient is a well appearing female in no acute distress HEENT:  Sclerae anicteric.  Oropharynx clear and moist. No ulcerations or evidence of oropharyngeal candidiasis. Neck is supple.  NODES:  No cervical, supraclavicular, or axillary lymphadenopathy palpated.  BREAST EXAM:  left breast s/p lumpectomy and radiation, no sign of local recurrence, left breast benign LUNGS:  Clear to auscultation bilaterally.  No wheezes or rhonchi. HEART:  Regular rate and rhythm. No murmur appreciated. ABDOMEN:  Soft, nontender.  Positive, normoactive bowel sounds. No organomegaly palpated. MSK:  No focal spinal tenderness to palpation. Full range of motion bilaterally in the upper extremities. EXTREMITIES:  No peripheral edema.   SKIN:  Clear with no obvious rashes or skin  changes. No nail dyscrasia. NEURO:  Nonfocal. Well oriented.  Appropriate affect.   LABORATORY DATA:  None for this visit.  DIAGNOSTIC IMAGING:  None for this visit.      ASSESSMENT AND PLAN:  Ms.. Kees is a pleasant 68 y.o. female with Stage 0 left breast DCIS, ER+/PR+, diagnosed in 09/2023, treated with lumpectomy, adjuvant radiation therapy, and anti-estrogen therapy with Tamoxifen  beginning in 11/2023.  She presents to the Survivorship Clinic for our initial meeting and routine follow-up post-completion of treatment for breast cancer.    1. Stage 0 left breast cancer:  Ms. Bordner is continuing to recover from definitive treatment for breast cancer. She will follow-up with her medical oncologist, Dr. Loretha in 6 months with history and physical exam per surveillance protocol.  She will continue her anti-estrogen therapy with Tamoxifen . Thus far, she is tolerating the TAmoxifen  well, with minimal side effects. Her mammogram is due 08/2024; orders placed today.   Today, a comprehensive survivorship care plan and treatment summary was reviewed with the patient today detailing her breast cancer diagnosis, treatment course, potential late/long-term effects of treatment, appropriate follow-up care with recommendations for the future, and patient education resources.  A copy of this summary, along with a letter will be sent to the patient's  primary care provider via mail/fax/In Basket message after today's visit.    2. Bone health:   She was given education on specific activities to promote bone health.  3. Cancer screening:  Due to Ms. Waters's history and her age, she should receive screening for skin cancers, colon cancer, and gynecologic cancers.  The information and recommendations are listed on the patient's comprehensive care plan/treatment summary and were reviewed in detail with the patient.    4. Health maintenance and wellness promotion: Ms. Whiten was encouraged to  consume 5-7 servings of fruits and vegetables per day. We reviewed the Nutrition Rainbow handout.  She was also encouraged to engage in moderate to vigorous exercise for 30 minutes per day most days of the week.  She was instructed to limit her alcohol consumption and continue to abstain from tobacco use.     5. Support services/counseling: It is not uncommon for this period of the patient's cancer care trajectory to be one of many emotions and stressors.   She was given information regarding our available services and encouraged to contact me with any questions or for help enrolling in any of our support group/programs.    Follow up instructions:    -Return to cancer center in 6 months for f/u with Dr. Loretha  -Mammogram due in 11/12025 -She is welcome to return back to the Survivorship Clinic at any time; no additional follow-up needed at this time.  -Consider referral back to survivorship as a long-term survivor for continued surveillance  The patient was provided an opportunity to ask questions and all were answered. The patient agreed with the plan and demonstrated an understanding of the instructions.   Total encounter time:40 minutes*in face-to-face visit time, chart review, lab review, care coordination, order entry, and documentation of the encounter time.    Morna Kendall, NP 05/11/24 8:53 AM Medical Oncology and Hematology [facility] [address] [place], [place] [address] Tel. [phone]    Fax. [phone]  *Total Encounter Time as defined by the Centers for Medicare and Medicaid Services includes, in addition to the face-to-face time of a patient visit (documented in the note above) non-face-to-face time: obtaining and reviewing outside history, ordering and reviewing medications, tests or procedures, care coordination (communications with other health care professionals or caregivers) and documentation in the medical record.

## 2024-05-12 ENCOUNTER — Other Ambulatory Visit: Payer: Self-pay | Admitting: Family Medicine

## 2024-05-12 DIAGNOSIS — E1169 Type 2 diabetes mellitus with other specified complication: Secondary | ICD-10-CM

## 2024-05-14 ENCOUNTER — Other Ambulatory Visit: Payer: Self-pay

## 2024-05-14 ENCOUNTER — Other Ambulatory Visit (HOSPITAL_COMMUNITY): Payer: Self-pay

## 2024-05-14 MED ORDER — EMPAGLIFLOZIN 25 MG PO TABS
25.0000 mg | ORAL_TABLET | Freq: Every day | ORAL | 0 refills | Status: DC
Start: 2024-05-14 — End: 2024-08-08
  Filled 2024-05-14: qty 90, 90d supply, fill #0

## 2024-05-18 LAB — GENECONNECT MOLECULAR SCREEN: Genetic Analysis Overall Interpretation: NEGATIVE

## 2024-05-28 ENCOUNTER — Other Ambulatory Visit (HOSPITAL_COMMUNITY): Payer: Self-pay

## 2024-05-28 ENCOUNTER — Other Ambulatory Visit: Payer: Self-pay | Admitting: Family Medicine

## 2024-05-28 DIAGNOSIS — I700 Atherosclerosis of aorta: Secondary | ICD-10-CM

## 2024-05-28 DIAGNOSIS — E1169 Type 2 diabetes mellitus with other specified complication: Secondary | ICD-10-CM

## 2024-05-28 MED ORDER — SIMVASTATIN 40 MG PO TABS
40.0000 mg | ORAL_TABLET | Freq: Every evening | ORAL | 0 refills | Status: DC
  Filled 2024-05-28: qty 90, 90d supply, fill #0

## 2024-06-29 ENCOUNTER — Ambulatory Visit: Admitting: Family Medicine

## 2024-07-06 ENCOUNTER — Other Ambulatory Visit: Payer: Self-pay | Admitting: Surgery

## 2024-07-06 DIAGNOSIS — D0512 Intraductal carcinoma in situ of left breast: Secondary | ICD-10-CM | POA: Diagnosis not present

## 2024-07-06 DIAGNOSIS — N6322 Unspecified lump in the left breast, upper inner quadrant: Secondary | ICD-10-CM

## 2024-07-09 ENCOUNTER — Telehealth: Payer: Self-pay

## 2024-07-09 DIAGNOSIS — D0512 Intraductal carcinoma in situ of left breast: Secondary | ICD-10-CM

## 2024-07-09 NOTE — Telephone Encounter (Signed)
 Effectiveness of Out-of-Pocket Psychologist, forensic ([employer]) in Cancer Patients   Cindy Carey was contacted by phone to confirm her availability for her 16-month [employer] financial counseling appointment on Friday, July 13, 2024 at 1:00 PM EST.   Pt confirmed her availability. Meanwhile, a patient message was also sent to this pt with date and time for this appt with [employer].  Pt was thanked for her time and continued support to this study.   Sadler Teschner, Ph.D. Clinical Research Coordinator [phone] 07/09/2024 4:08 PM

## 2024-07-12 ENCOUNTER — Other Ambulatory Visit: Payer: Self-pay | Admitting: *Deleted

## 2024-07-12 ENCOUNTER — Other Ambulatory Visit (HOSPITAL_COMMUNITY): Payer: Self-pay

## 2024-07-12 ENCOUNTER — Other Ambulatory Visit (INDEPENDENT_AMBULATORY_CARE_PROVIDER_SITE_OTHER): Payer: Self-pay | Admitting: Physician Assistant

## 2024-07-12 ENCOUNTER — Other Ambulatory Visit: Payer: Self-pay

## 2024-07-12 ENCOUNTER — Encounter: Payer: Self-pay | Admitting: Family Medicine

## 2024-07-12 DIAGNOSIS — I152 Hypertension secondary to endocrine disorders: Secondary | ICD-10-CM

## 2024-07-12 MED ORDER — LISINOPRIL 5 MG PO TABS
5.0000 mg | ORAL_TABLET | Freq: Every day | ORAL | 0 refills | Status: DC
  Filled 2024-07-12: qty 90, 90d supply, fill #0

## 2024-08-01 ENCOUNTER — Encounter (INDEPENDENT_AMBULATORY_CARE_PROVIDER_SITE_OTHER): Payer: Self-pay

## 2024-08-08 ENCOUNTER — Other Ambulatory Visit (HOSPITAL_COMMUNITY): Payer: Self-pay

## 2024-08-08 ENCOUNTER — Other Ambulatory Visit: Payer: Self-pay | Admitting: Family Medicine

## 2024-08-08 ENCOUNTER — Encounter (INDEPENDENT_AMBULATORY_CARE_PROVIDER_SITE_OTHER): Payer: Self-pay

## 2024-08-08 ENCOUNTER — Other Ambulatory Visit: Payer: Self-pay

## 2024-08-08 DIAGNOSIS — E1169 Type 2 diabetes mellitus with other specified complication: Secondary | ICD-10-CM

## 2024-08-08 MED ORDER — FREESTYLE LITE TEST VI STRP
1.0000 | ORAL_STRIP | Freq: Two times a day (BID) | 2 refills | Status: AC
  Filled 2024-08-08 – 2024-08-26 (×2): qty 100, 50d supply, fill #0
  Filled 2024-10-15: qty 100, 50d supply, fill #1

## 2024-08-08 MED ORDER — EMPAGLIFLOZIN 25 MG PO TABS
25.0000 mg | ORAL_TABLET | Freq: Every day | ORAL | 0 refills | Status: DC
  Filled 2024-08-08: qty 90, 90d supply, fill #0

## 2024-08-16 NOTE — Progress Notes (Unsigned)
   LILLETTE Ileana Collet, PhD, LAT, ATC acting as a scribe for Artist Lloyd, MD.  Ellouise SHAUNNA Bollman is a 68 y.o. female who presents to [facility] today for R knee pain. Pt was previously seen by Dr. Lloyd on 04/26/23 for R elbow pain.  Today, pt c/o R knee pain x ***. Pt locates pain to ***  R Knee swelling: Mechanical symptoms: Aggravates: Treatments tried:  Pertinent review of systems: ***  Relevant historical information: ***   Exam:  LMP 10/18/1982  General: Well Developed, well nourished, and in no acute distress.   MSK: ***    Lab and Radiology Results No results found for this or any previous visit (from the past 72 hours). No results found.     Assessment and Plan: 67 y.o. female with ***   PDMP not reviewed this encounter. No orders of the defined types were placed in this encounter.  No orders of the defined types were placed in this encounter.    Discussed warning signs or symptoms. Please see discharge instructions. Patient expresses understanding.   ***

## 2024-08-17 ENCOUNTER — Other Ambulatory Visit: Payer: Self-pay

## 2024-08-17 ENCOUNTER — Encounter: Payer: Self-pay | Admitting: Family Medicine

## 2024-08-17 ENCOUNTER — Ambulatory Visit: Admitting: Family Medicine

## 2024-08-17 VITALS — BP 124/80 | HR 74 | Ht 62.5 in | Wt 220.0 lb

## 2024-08-17 DIAGNOSIS — G8929 Other chronic pain: Secondary | ICD-10-CM | POA: Diagnosis not present

## 2024-08-17 DIAGNOSIS — M25562 Pain in left knee: Secondary | ICD-10-CM

## 2024-08-17 DIAGNOSIS — M25561 Pain in right knee: Secondary | ICD-10-CM | POA: Diagnosis not present

## 2024-08-17 NOTE — Patient Instructions (Signed)
 Thank you for coming in today.   Please use Voltaren gel (Generic Diclofenac Gel) up to 4x daily for pain as needed.  This is available over-the-counter as both the name brand Voltaren gel and the generic diclofenac gel.   Recommend IBU and/or Tylenol   See you back as needed.

## 2024-08-21 ENCOUNTER — Other Ambulatory Visit (HOSPITAL_COMMUNITY): Payer: Self-pay

## 2024-08-21 ENCOUNTER — Other Ambulatory Visit (INDEPENDENT_AMBULATORY_CARE_PROVIDER_SITE_OTHER): Payer: Self-pay | Admitting: Physician Assistant

## 2024-08-21 ENCOUNTER — Other Ambulatory Visit (INDEPENDENT_AMBULATORY_CARE_PROVIDER_SITE_OTHER): Payer: Self-pay | Admitting: Family Medicine

## 2024-08-21 ENCOUNTER — Encounter (HOSPITAL_COMMUNITY): Payer: Self-pay

## 2024-08-21 DIAGNOSIS — E1169 Type 2 diabetes mellitus with other specified complication: Secondary | ICD-10-CM

## 2024-08-21 MED ORDER — MOUNJARO 15 MG/0.5ML ~~LOC~~ SOAJ
15.0000 mg | SUBCUTANEOUS | 0 refills | Status: DC
  Filled 2024-08-21 – 2024-08-22 (×2): qty 2, 28d supply, fill #0

## 2024-08-22 ENCOUNTER — Other Ambulatory Visit (HOSPITAL_COMMUNITY): Payer: Self-pay

## 2024-08-26 ENCOUNTER — Other Ambulatory Visit: Payer: Self-pay | Admitting: Family Medicine

## 2024-08-26 DIAGNOSIS — E1169 Type 2 diabetes mellitus with other specified complication: Secondary | ICD-10-CM

## 2024-08-26 DIAGNOSIS — I700 Atherosclerosis of aorta: Secondary | ICD-10-CM

## 2024-08-27 ENCOUNTER — Other Ambulatory Visit: Payer: Self-pay

## 2024-08-27 ENCOUNTER — Other Ambulatory Visit (HOSPITAL_COMMUNITY): Payer: Self-pay

## 2024-08-27 MED ORDER — SIMVASTATIN 40 MG PO TABS
40.0000 mg | ORAL_TABLET | Freq: Every evening | ORAL | 0 refills | Status: DC
  Filled 2024-08-27: qty 90, 90d supply, fill #0

## 2024-09-03 ENCOUNTER — Other Ambulatory Visit: Payer: Self-pay | Admitting: Adult Health

## 2024-09-03 DIAGNOSIS — N6322 Unspecified lump in the left breast, upper inner quadrant: Secondary | ICD-10-CM

## 2024-09-06 ENCOUNTER — Ambulatory Visit
Admission: RE | Admit: 2024-09-06 | Discharge: 2024-09-06 | Disposition: A | Source: Ambulatory Visit | Attending: Surgery | Admitting: Surgery

## 2024-09-06 ENCOUNTER — Ambulatory Visit
Admission: RE | Admit: 2024-09-06 | Discharge: 2024-09-06 | Disposition: A | Source: Ambulatory Visit | Attending: Adult Health | Admitting: Adult Health

## 2024-09-06 DIAGNOSIS — D0512 Intraductal carcinoma in situ of left breast: Secondary | ICD-10-CM

## 2024-09-06 DIAGNOSIS — N6322 Unspecified lump in the left breast, upper inner quadrant: Secondary | ICD-10-CM

## 2024-09-06 DIAGNOSIS — N6489 Other specified disorders of breast: Secondary | ICD-10-CM | POA: Diagnosis not present

## 2024-09-06 DIAGNOSIS — R928 Other abnormal and inconclusive findings on diagnostic imaging of breast: Secondary | ICD-10-CM | POA: Diagnosis not present

## 2024-09-17 ENCOUNTER — Other Ambulatory Visit: Payer: Self-pay

## 2024-09-17 ENCOUNTER — Other Ambulatory Visit: Payer: Self-pay | Admitting: Family Medicine

## 2024-09-17 ENCOUNTER — Other Ambulatory Visit (HOSPITAL_COMMUNITY): Payer: Self-pay

## 2024-09-17 DIAGNOSIS — E1169 Type 2 diabetes mellitus with other specified complication: Secondary | ICD-10-CM

## 2024-09-17 MED ORDER — METFORMIN HCL ER 500 MG PO TB24
500.0000 mg | ORAL_TABLET | Freq: Two times a day (BID) | ORAL | 0 refills | Status: DC
  Filled 2024-09-17: qty 180, 90d supply, fill #0

## 2024-09-23 NOTE — Progress Notes (Unsigned)
 No chief complaint on file.  Obesity: She no longer sees [facility], transferred care back to us  in July.  She continues on Mounjaro  15 mg weekly, without side effects. She has been very good in sticking to her plan (eating, exercise).  Exercise includes walking at least 4 days/week for 30 minutes, carrying 8# dog for part of the walk. No longer going to the gym.  Wt Readings from Last 3 Encounters:  08/17/24 220 lb (99.8 kg)  05/11/24 218 lb 1.6 oz (98.9 kg)  05/01/24 220 lb (99.8 kg)        Diabetes:  has been well controlled on Mounjaro  15 mg weekly, Jardiance  25 mg daily, and metformin  ER 500 mg BID.  Last A1c was 5.0 in 03/2024. Sugars are running ***  She denies hypoglycemia, polydipsia, polyuria. Last eye exam was 10/2023, no retinopathy. She checks her feet regularly and denies concerns. She is on lisinopril  for renoprotection from DM, not for HTN  Blood pressures are normal, denies dizziness.  She has leg cramps intermittently at night, related to which shoes she wore all day.  She also gets leg cramps with long car rides if they don't stop. Denies any recent changes/worsening. She stopped the Mg (maybe helped a little) when having diarrhea. She thinks this may be related to her back, as it seems somewhat positional.  ***UPDATE    DCIS of L breast was diagnosed in 09/2023, s/p lumpectomy in 10/2023, and radiation.  She started Tamoxifen  01/29/24. She denies side effects. She denies any breast concerns. She previously had L breast intraductal papilloma with atypical ductal hyperplasia diagnosed 07/15/2015; status post lumpectomy 07/23/2015. She completed a 5 yea course of Tamoxifen  in 08/2020.  She had bilateral diagnostic mammogram in 08/2024. She denies breast concerns today.     GERD:  Patient with known hiatal hernia and issues with heartburn.  She takes Pepcid  20mg  BID, and has been doing well, not needing Tums. (Prior to being on Mounjaro , took it just once daily).   She  last saw Dr. Kristie in 01/2024 for issues with diarrhea x 6 months.  Diarrhea had improved after the prednisone  for the dermatitis, and had resolved prior to her visit.  Bowels remain normal.   H/o B12 deficiency.  She stopped taking supplement in May, and B12 level was normal. She does not take MVI.   Lab Results  Component Value Date   VITAMINB12 736 03/21/2024    Migraines--She used to takes magnesium supplement, both for migraines and leg cramps.  She has done pretty well off of the Mg. She gets migraines only 2-3x/year, triggered by stress, not eating.  She uses ibuprofen at time of aura, phenergan  if that doesn't work.     (Stopped topiramate  in the past due to hair thinning. Improved after stopping, but improved much more after completing tamoxifen ). She uses flexeril  before bedtime when muscles are very tight, or if needed for R shoulder pain (1/2 tablet once a week), or else she won't sleep well.   She uses Tylenol  a few times/week before bedtime for pain (hip, knee, back, shoulder).  She saw Dr. Joane in 07/2024 for bilateral lateral knee pain; he suspected ITB, and recommended voltaren gel and tylenol . To get x-rays if worsening.   Hyperlipidemia follow-up:  Patient is reportedly following a low-fat, low cholesterol diet. Compliant with medications (simvastatin  40mg ) and denies medication side effects. Lipids were at goal on last check in June.   Lab Results  Component Value Date  CHOL 129 03/21/2024   HDL 62 03/21/2024   LDLCALC 46 03/21/2024   TRIG 117 03/21/2024   CHOLHDL 2.1 03/21/2024      Non-obstructive CAD: Calcium score of 1 in 01/2018. She had CT calcium score repeated 02/27/21 (part of Doctor's Day):  Coronary calcium score of 36. This was 33 percentile for age-, race-, and sex-matched controls. Aortic arch atherosclerosis noted, small focal area. She sees Dr. Raford yearly, last in 03/2024. She is compliant with ASA 81mg  daily and statin. She denies chest pain,  DOE, palpitations.   Mild thrombocytopenia. She denies bleeding or bruising.  Lab Results  Component Value Date   WBC 5.8 03/21/2024   HGB 12.2 03/21/2024   HCT 39.8 03/21/2024   MCV 87 03/21/2024   PLT 90 (LL) 03/21/2024      PMH, PSH, SH reviewed   ROS:    PHYSICAL EXAM:  LMP 10/18/1982   Wt Readings from Last 3 Encounters:  08/17/24 220 lb (99.8 kg)  05/11/24 218 lb 1.6 oz (98.9 kg)  05/01/24 220 lb (99.8 kg)   03/21/24 218 lb 6.4 oz (99.1 kg)   03/16/23 231 lb 3.2 oz (104.9 kg)  03/10/22         236 lb 6.4 oz (107.2 kg)  03/09/21 259 lb 12.8 oz (117.8 kg)      ASSESSMENT/PLAN:  Enter flu shot Offer COVID  A1c CBC, and probably B12 if still on no supplements or MVI, vs waiting until CPE to recheck B12   F/u as scheduled for CPE in 03/2025

## 2024-09-24 ENCOUNTER — Other Ambulatory Visit (HOSPITAL_COMMUNITY): Payer: Self-pay

## 2024-09-24 ENCOUNTER — Encounter: Payer: Self-pay | Admitting: Family Medicine

## 2024-09-24 ENCOUNTER — Ambulatory Visit: Payer: Self-pay | Admitting: Family Medicine

## 2024-09-24 VITALS — BP 124/72 | HR 64 | Ht 62.5 in | Wt 218.8 lb

## 2024-09-24 DIAGNOSIS — I2510 Atherosclerotic heart disease of native coronary artery without angina pectoris: Secondary | ICD-10-CM | POA: Diagnosis not present

## 2024-09-24 DIAGNOSIS — G43109 Migraine with aura, not intractable, without status migrainosus: Secondary | ICD-10-CM | POA: Diagnosis not present

## 2024-09-24 DIAGNOSIS — E785 Hyperlipidemia, unspecified: Secondary | ICD-10-CM | POA: Diagnosis not present

## 2024-09-24 DIAGNOSIS — Z7985 Long-term (current) use of injectable non-insulin antidiabetic drugs: Secondary | ICD-10-CM | POA: Diagnosis not present

## 2024-09-24 DIAGNOSIS — E538 Deficiency of other specified B group vitamins: Secondary | ICD-10-CM | POA: Diagnosis not present

## 2024-09-24 DIAGNOSIS — I700 Atherosclerosis of aorta: Secondary | ICD-10-CM | POA: Diagnosis not present

## 2024-09-24 DIAGNOSIS — E1169 Type 2 diabetes mellitus with other specified complication: Secondary | ICD-10-CM | POA: Diagnosis not present

## 2024-09-24 DIAGNOSIS — Z7984 Long term (current) use of oral hypoglycemic drugs: Secondary | ICD-10-CM

## 2024-09-24 DIAGNOSIS — D696 Thrombocytopenia, unspecified: Secondary | ICD-10-CM | POA: Diagnosis not present

## 2024-09-24 DIAGNOSIS — Z23 Encounter for immunization: Secondary | ICD-10-CM | POA: Diagnosis not present

## 2024-09-24 DIAGNOSIS — D0512 Intraductal carcinoma in situ of left breast: Secondary | ICD-10-CM

## 2024-09-24 LAB — POCT GLYCOSYLATED HEMOGLOBIN (HGB A1C): Hemoglobin A1C: 5.1 % (ref 4.0–5.6)

## 2024-09-24 MED ORDER — MOUNJARO 15 MG/0.5ML ~~LOC~~ SOAJ
15.0000 mg | SUBCUTANEOUS | 5 refills | Status: AC
  Filled 2024-09-24: qty 2, 28d supply, fill #0
  Filled 2024-09-28: qty 6, 84d supply, fill #0

## 2024-09-24 MED ORDER — LISINOPRIL 5 MG PO TABS
5.0000 mg | ORAL_TABLET | Freq: Every day | ORAL | 1 refills | Status: AC
  Filled 2024-09-24 – 2024-09-30 (×3): qty 90, 90d supply, fill #0

## 2024-09-24 MED ORDER — METFORMIN HCL ER 500 MG PO TB24: 500.0000 mg | ORAL_TABLET | Freq: Every day | ORAL | Status: AC

## 2024-09-24 NOTE — Patient Instructions (Addendum)
 If you choose to stop the B12 again, let's recheck your B12 level in 6 months.  Restart the vitamin if you notice any tingling or other symptoms. Consider taking a multivitamin for a smaller amount, given that you had low levels in the past.  Decrease your metformin  to 500 mg once daily.  I know you just got this refilled--this should last until your next visit.

## 2024-09-28 ENCOUNTER — Other Ambulatory Visit (HOSPITAL_COMMUNITY): Payer: Self-pay

## 2024-09-30 ENCOUNTER — Other Ambulatory Visit (HOSPITAL_COMMUNITY): Payer: Self-pay

## 2024-10-04 ENCOUNTER — Other Ambulatory Visit: Payer: Self-pay

## 2024-10-04 ENCOUNTER — Other Ambulatory Visit: Payer: Self-pay | Admitting: Family Medicine

## 2024-10-04 ENCOUNTER — Encounter: Payer: Self-pay | Admitting: Family Medicine

## 2024-10-04 MED ORDER — CYCLOBENZAPRINE HCL 10 MG PO TABS
10.0000 mg | ORAL_TABLET | Freq: Three times a day (TID) | ORAL | 0 refills | Status: AC | PRN
  Filled 2024-10-04: qty 30, 10d supply, fill #0

## 2024-10-04 MED ORDER — PROMETHAZINE HCL 25 MG PO TABS
25.0000 mg | ORAL_TABLET | Freq: Three times a day (TID) | ORAL | 0 refills | Status: AC | PRN
  Filled 2024-10-04: qty 30, 10d supply, fill #0

## 2024-10-04 NOTE — Telephone Encounter (Signed)
 Patient is requesting these, she sent a MyChart message-I forwarded to you.

## 2024-11-04 ENCOUNTER — Other Ambulatory Visit: Payer: Self-pay | Admitting: Family Medicine

## 2024-11-04 ENCOUNTER — Other Ambulatory Visit: Payer: Self-pay | Admitting: Hematology and Oncology

## 2024-11-04 DIAGNOSIS — E1169 Type 2 diabetes mellitus with other specified complication: Secondary | ICD-10-CM

## 2024-11-04 MED ORDER — TAMOXIFEN CITRATE 20 MG PO TABS
20.0000 mg | ORAL_TABLET | Freq: Every day | ORAL | 3 refills | Status: AC
  Filled 2024-11-04: qty 90, 90d supply, fill #0

## 2024-11-05 ENCOUNTER — Other Ambulatory Visit: Payer: Self-pay

## 2024-11-05 ENCOUNTER — Other Ambulatory Visit (HOSPITAL_COMMUNITY): Payer: Self-pay

## 2024-11-05 MED ORDER — EMPAGLIFLOZIN 25 MG PO TABS
25.0000 mg | ORAL_TABLET | Freq: Every day | ORAL | 1 refills | Status: AC
  Filled 2024-11-05 (×2): qty 90, 90d supply, fill #0

## 2024-11-06 ENCOUNTER — Other Ambulatory Visit: Payer: Self-pay

## 2024-11-09 ENCOUNTER — Inpatient Hospital Stay: Attending: Hematology and Oncology | Admitting: Hematology and Oncology

## 2024-11-09 ENCOUNTER — Ambulatory Visit: Admitting: Hematology and Oncology

## 2024-11-09 VITALS — BP 100/80 | HR 79 | Temp 97.6°F | Resp 16 | Ht 62.5 in | Wt 212.9 lb

## 2024-11-09 DIAGNOSIS — E119 Type 2 diabetes mellitus without complications: Secondary | ICD-10-CM | POA: Diagnosis not present

## 2024-11-09 DIAGNOSIS — Z90710 Acquired absence of both cervix and uterus: Secondary | ICD-10-CM | POA: Insufficient documentation

## 2024-11-09 DIAGNOSIS — Z803 Family history of malignant neoplasm of breast: Secondary | ICD-10-CM | POA: Insufficient documentation

## 2024-11-09 DIAGNOSIS — M8580 Other specified disorders of bone density and structure, unspecified site: Secondary | ICD-10-CM | POA: Diagnosis not present

## 2024-11-09 DIAGNOSIS — Z7984 Long term (current) use of oral hypoglycemic drugs: Secondary | ICD-10-CM | POA: Diagnosis not present

## 2024-11-09 DIAGNOSIS — Z90722 Acquired absence of ovaries, bilateral: Secondary | ICD-10-CM | POA: Insufficient documentation

## 2024-11-09 DIAGNOSIS — Z801 Family history of malignant neoplasm of trachea, bronchus and lung: Secondary | ICD-10-CM | POA: Insufficient documentation

## 2024-11-09 DIAGNOSIS — D0512 Intraductal carcinoma in situ of left breast: Secondary | ICD-10-CM | POA: Diagnosis not present

## 2024-11-09 DIAGNOSIS — Z79810 Long term (current) use of selective estrogen receptor modulators (SERMs): Secondary | ICD-10-CM | POA: Insufficient documentation

## 2024-11-09 LAB — OPHTHALMOLOGY REPORT-SCANNED

## 2024-11-09 NOTE — Progress Notes (Signed)
 [facility] CONSULT NOTE  Patient Care Team: Randol Dawes, MD as PCP - General (Family Medicine) Loretha Ash, MD as Consulting Physician (Hematology and Oncology) Vernetta Berg, MD as Consulting Physician (General Surgery) Dewey Rush, MD as Consulting Physician (Radiation Oncology) Kristie Lamprey, MD as Consulting Physician (Gastroenterology) Rayburn, Elouise Phlegm, PA-C as Physician Assistant (Family Medicine)  CHIEF COMPLAINTS/PURPOSE OF CONSULTATION:  Newly diagnosed breast cancer  HISTORY OF PRESENTING ILLNESS:  Cindy Carey 69 y.o. female is here because of recent diagnosis of left breast DCIS  History of Present Illness    The patient is a 69 year old female with noninvasive breast cancer who presents for follow-up I reviewed her records extensively and collaborated the history with the patient.  SUMMARY OF ONCOLOGIC HISTORY: Oncology History  Ductal carcinoma in situ (DCIS) of left breast  10/05/2023 Initial Diagnosis   Ductal carcinoma in situ (DCIS) of left breast   11/02/2023 Surgery   Left breast lumpectomy: DCIS, 1.2 cm, margins negative.    11/18/2023 Cancer Staging   Staging form: Breast, AJCC 8th Edition - Clinical stage from 11/18/2023: Stage 0 (cTis (DCIS), cN0, cM0, G2, PR-) - Signed by Loretha Ash, MD on 11/18/2023 Stage prefix: Initial diagnosis Histologic grading system: 3 grade system   12/05/2023 - 12/30/2023 Radiation Therapy   Plan Name: Breast_L_BH Site: Breast, Left Technique: 3D Mode: Photon Dose Per Fraction: 2.66 Gy Prescribed Dose (Delivered / Prescribed): 42.56 Gy / 42.56 Gy Prescribed Fxs (Delivered / Prescribed): 16 / 16   Plan Name: Brst_L_Bst_BH Site: Breast, Left Technique: 3D Mode: Photon Dose Per Fraction: 2 Gy Prescribed Dose (Delivered / Prescribed): 8 Gy / 8 Gy Prescribed Fxs (Delivered / Prescribed): 4 / 4   11/2023 -  Anti-estrogen oral therapy   20 mg Tamoxifen    05/11/2024 Cancer Staging    Staging form: Breast, AJCC 8th Edition - Pathologic: Stage 0 (pTis (DCIS), pN0, cM0, ER+, PR-) - Signed by Crawford Morna Pickle, NP on 05/11/2024    Discussed the use of AI scribe software for clinical note transcription with the patient, who gave verbal consent to proceed.  History of Present Illness  Cindy Carey is a 69 year old female with ductal carcinoma in situ of the left breast who presents for routine oncology follow-up.  She continues tamoxifen  20 mg daily and reports no significant side effects.   Last summer, she noted a firm, ridge-like area of swelling in one section of her left breast, which was evaluated with mammogram and ultrasound in November 2025. No new breast changes or symptoms have occurred since that time.  She maintains an active lifestyle, walking her puppy multiple times daily, and denies any new health issues since her last oncology visit. She has no complaints and feels well overall.    MEDICAL HISTORY:  Past Medical History:  Diagnosis Date   Anemia    Asthma    Atypical ductal hyperplasia of left breast    Back pain    Blood transfusion without reported diagnosis 1983   CAD (coronary artery disease)    Diabetes mellitus without complication (HCC) 2010   T2DM   Dyspareunia    Exercise-induced asthma    worse in winter   Food allergy    H/O bone density study 2013   H/O cold sores    H/O colonoscopy 2009   History of endometriosis    Hyperlipidemia    Hypertension    Injury of tendon of rotator cuff 2015   gym injury (right)  Joint pain    Knee pain 08/2015   L>R   Migraine with typical aura    Mild CAD 04/04/2018   Mild disease on coronary CT-A 01/2018.   Morbid obesity (HCC) 09/04/2012   BMI 49.4 kg/m^2    Osteoarthritis    Plantar fasciitis, left 2019   PONV (postoperative nausea and vomiting)    Trigger finger of left thumb 08/2018   injected by Dr. Murrell 09/18/18    SURGICAL HISTORY: Past Surgical History:   Procedure Laterality Date   BREAST BIOPSY  2010   BREAST BIOPSY Left 09/28/2023   US  LT BREAST BX W LOC DEV 1ST LESION IMG BX SPEC US  GUIDE 09/28/2023 [facility]   BREAST BIOPSY  11/01/2023   MM LT RADIOACTIVE SEED LOC MAMMO GUIDE 11/01/2023 [facility]   BREAST LUMPECTOMY  2010   Benign   BREAST LUMPECTOMY  2024   BREAST LUMPECTOMY WITH RADIOACTIVE SEED LOCALIZATION Left 07/23/2015   Procedure: LEFT BREAST SEED GUIDED EXCISION;  Surgeon: Donnice Bury, MD;  Location: [facility];  Service: General;  Laterality: Left;   BREAST LUMPECTOMY WITH RADIOACTIVE SEED LOCALIZATION Left 11/02/2023   Procedure: RADIOACTIVE SEED GUIDED LEFT BREAST LUMPECTOMY;  Surgeon: Vernetta Berg, MD;  Location: [facility];  Service: General;  Laterality: Left;  LMA   BREATH TEK H PYLORI  08/28/2012   Procedure: BREATH TEK H PYLORI;  Surgeon: Donnice KATHEE Lunger, MD;  Location: [facility];  Service: General;  Laterality: N/A;   CHOLECYSTECTOMY  2007   EXPLORATORY LAPAROTOMY  1983   endometriosis   GANGLION CYST EXCISION Left 1999   Foot   TOTAL ABDOMINAL HYSTERECTOMY W/ BILATERAL SALPINGOOPHORECTOMY  1984   endometriosis (and appendectomy)    SOCIAL HISTORY: Social History   Socioeconomic History   Marital status: Married    Spouse name: Aliyana Dlugosz   Number of children: Not on file   Years of education: Not on file   Highest education level: Master's degree (e.g., MA, MS, MEng, MEd, MSW, MBA)  Occupational History   Occupation: Publishing Rights Manager    Comment: [employer]  Tobacco Use   Smoking status: Never   Smokeless tobacco: Never  Vaping Use   Vaping status: Never Used  Substance and Sexual Activity   Alcohol use: No   Drug use: No   Sexual activity: Yes    Partners: Male    Birth control/protection: Surgical  Other Topics Concern   Not on file  Social History Narrative   Married, no children. 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   May retire June 2026.      Updated 09/2024   Social Drivers of Health   Tobacco Use: Low Risk (09/24/2024)   Patient History    Smoking Tobacco Use: Never    Smokeless Tobacco Use: Never    Passive Exposure: Not on file  Financial Resource Strain: Low Risk (09/20/2024)   Overall Financial Resource Strain (CARDIA)    Difficulty of Paying Living Expenses: Not hard at all  Food Insecurity: No Food Insecurity (09/20/2024)   Epic    Worried About Programme Researcher, Broadcasting/film/video in the Last Year: Never true    Ran Out of Food in the Last Year: Never true  Transportation Needs: No Transportation Needs (09/20/2024)   Epic    Lack of Transportation (Medical): No    Lack of Transportation (  Non-Medical): No  Physical Activity: Sufficiently Active (09/20/2024)   Exercise Vital Sign    Days of Exercise per Week: 5 days    Minutes of Exercise per Session: 30 min  Stress: No Stress Concern Present (09/20/2024)   [employer] of Occupational Health - Occupational Stress Questionnaire    Feeling of Stress: Not at all  Social Connections: Socially Integrated (09/20/2024)   Social Connection and Isolation Panel    Frequency of Communication with Friends and Family: More than three times a week    Frequency of Social Gatherings with Friends and Family: Twice a week    Attends Religious Services: More than 4 times per year    Active Member of [employer] or Organizations: Yes    Attends Engineer, Structural: More than 4 times per year    Marital Status: Married  Catering Manager Violence: Not At Risk (03/21/2024)   Humiliation, Afraid, Rape, and Kick questionnaire    Fear of Current or Ex-Partner: No    Emotionally Abused: No    Physically Abused: No    Sexually Abused: No  Depression (PHQ2-9): Low Risk (05/01/2024)   Depression (PHQ2-9)    PHQ-2 Score: 0  Alcohol Screen: Not on file   Housing: Low Risk (09/20/2024)   Epic    Unable to Pay for Housing in the Last Year: No    Number of Times Moved in the Last Year: 0    Homeless in the Last Year: No  Utilities: Not At Risk (03/21/2024)   [facility] Utilities    Threatened with loss of utilities: No  Health Literacy: Not on file    FAMILY HISTORY: Family History  Problem Relation Age of Onset   Thyroid disease Mother        hypothyroidism   Heart disease Mother 2       CABG at 35   Hypertension Mother    Diverticulitis Mother 86       perforation, required surgery   Arthritis Mother    Diabetes Father    Heart attack Father 37   Heart disease Father    Hypertension Father    Peripheral Artery Disease Father        stents at 80, another at 61   Pneumonia Sister    Osteoporosis Maternal Grandmother    Heart failure Maternal Grandmother    Lung cancer Maternal Grandfather    Cancer Maternal Grandfather    Dementia Paternal Grandmother    Melanoma Paternal Grandmother        melanoma on her foot (not related to death)   Cancer Paternal Grandmother    Stroke Paternal Grandmother    Diabetes Paternal Grandfather    Heart disease Paternal Grandfather    Stroke Paternal Grandfather    Lung cancer Maternal Aunt    Cancer Maternal Aunt        brain   Heart failure Maternal Uncle    Lung cancer Maternal Uncle    Cancer Maternal Uncle    Congestive Heart Failure Maternal Uncle    Breast cancer Paternal Aunt        71's   Breast cancer Cousin        mets to lung   Congestive Heart Failure Cousin        related to chemo from breast cancer   Breast cancer Cousin     ALLERGIES:  is allergic to bee venom, shellfish allergy, invokana  [canagliflozin ], sulfamethoxazole -trimethoprim , and penicillins.  MEDICATIONS:  Current Outpatient Medications  Medication Sig  Dispense Refill   aspirin EC 81 MG tablet Take 81 mg by mouth daily.     Blood Glucose Monitoring Suppl (BLOOD GLUCOSE MONITOR SYSTEM) w/Device KIT Use to  check blood sugar as needed as directed 1 kit 0   Calcium Carb-Cholecalciferol (CALCIUM 600 + D PO) Calcium 600 + D     Cholecalciferol (VITAMIN D3) 125 MCG (5000 UT) CAPS Take 1 capsule by mouth daily.     cyclobenzaprine  (FLEXERIL ) 10 MG tablet Take 1 tablet (10 mg total) by mouth 3 (three) times daily as needed for muscle spasms. 30 tablet 0   empagliflozin  (JARDIANCE ) 25 MG TABS tablet Take 1 tablet (25 mg total) by mouth daily with breakfast. 90 tablet 1   EPINEPHrine  0.3 mg/0.3 mL IJ SOAJ injection Inject 0.3 mg into the muscle.     glucose blood (FREESTYLE LITE) test strip Test  2 (two) times daily. 100 each 2   Lancets (FREESTYLE) lancets 1 each by Other route 2 (two) times daily. Test twice daily Dx:E11.9 100 each 2   lisinopril  (ZESTRIL ) 5 MG tablet Take 1 tablet (5 mg total) by mouth daily. 90 tablet 1   Menatetrenone (VITAMIN K2) 100 MCG TABS      metFORMIN  (GLUCOPHAGE -XR) 500 MG 24 hr tablet Take 1 tablet (500 mg total) by mouth daily with breakfast.     ondansetron  (ZOFRAN ) 4 MG tablet Take 1 tablet (4 mg total) by mouth every 8 (eight) hours as needed for nausea or vomiting. 20 tablet 0   promethazine  (PHENERGAN ) 25 MG tablet Take 1 tablet (25 mg total) by mouth every 8 (eight) hours as needed for nausea or vomiting. 30 tablet 0   simvastatin  (ZOCOR ) 40 MG tablet Take 1 tablet (40 mg total) by mouth every evening. 90 tablet 0   tamoxifen  (NOLVADEX ) 20 MG tablet Take 1 tablet (20 mg total) by mouth daily. 90 tablet 3   tirzepatide  (MOUNJARO ) 15 MG/0.5ML Pen Inject 15 mg into the skin once a week. 2 mL 5   No current facility-administered medications for this visit.    All other systems were reviewed with the patient and are negative.  PHYSICAL EXAMINATION: ECOG PERFORMANCE STATUS: 0 - Asymptomatic  Vitals:   11/09/24 1216  BP: 100/80  Pulse: 79  Resp: 16  Temp: 97.6 F (36.4 C)  SpO2: 98%   Filed Weights   11/09/24 1216  Weight: 212 lb 14.4 oz (96.6 kg)     GENERAL:alert, no distress and comfortable Bilateral breasts inspected and palpated. No palpable masses or regional adenopathy CTA bilaterally No LE edema  LABORATORY DATA:  I have reviewed the data as listed Lab Results  Component Value Date   WBC 5.8 03/21/2024   HGB 12.2 03/21/2024   HCT 39.8 03/21/2024   MCV 87 03/21/2024   PLT 90 (LL) 03/21/2024   Lab Results  Component Value Date   NA 138 03/21/2024   K 4.5 03/21/2024   CL 104 03/21/2024   CO2 19 (L) 03/21/2024    RADIOGRAPHIC STUDIES: I have personally reviewed the radiological reports and agreed with the findings in the report.  ASSESSMENT AND PLAN:  Assessment & Plan Ductal carcinoma in situ of left breast Managed with tamoxifen  20 mg daily. Asymptomatic, tolerates medication well, active lifestyle. Ongoing surveillance appropriate.  - Continued tamoxifen  20 mg daily. - Ordered annual mammogram for November 2026 - Recommended annual comprehensive metabolic panel to monitor hepatic function. Last labs in June 2025 unremarkable LFT - Scheduled follow-up in one  year. - Arranged tamoxifen  refills as needed.  Time spent: 20 min  All questions were answered. The patient knows to call the clinic with any problems, questions or concerns.    Amber Stalls, MD 11/09/24

## 2024-11-15 ENCOUNTER — Other Ambulatory Visit: Payer: Self-pay

## 2024-11-20 ENCOUNTER — Other Ambulatory Visit: Payer: Self-pay

## 2024-11-22 ENCOUNTER — Other Ambulatory Visit (HOSPITAL_COMMUNITY): Payer: Self-pay

## 2024-11-22 ENCOUNTER — Other Ambulatory Visit: Payer: Self-pay

## 2024-11-22 ENCOUNTER — Other Ambulatory Visit: Payer: Self-pay | Admitting: Family Medicine

## 2024-11-22 DIAGNOSIS — I700 Atherosclerosis of aorta: Secondary | ICD-10-CM

## 2024-11-22 DIAGNOSIS — E1169 Type 2 diabetes mellitus with other specified complication: Secondary | ICD-10-CM

## 2024-11-22 MED ORDER — SIMVASTATIN 40 MG PO TABS
40.0000 mg | ORAL_TABLET | Freq: Every evening | ORAL | 1 refills | Status: AC
  Filled 2024-11-22: qty 90, 90d supply, fill #0

## 2025-04-10 ENCOUNTER — Ambulatory Visit: Payer: Self-pay | Admitting: Family Medicine

## 2025-11-12 ENCOUNTER — Inpatient Hospital Stay: Admitting: Hematology and Oncology
